# Patient Record
Sex: Male | Born: 1955
Health system: Southern US, Community
[De-identification: ages and names within clinical notes are randomized; demographics above are authoritative.]

## PROBLEM LIST (undated history)

## (undated) DIAGNOSIS — R0602 Shortness of breath: Secondary | ICD-10-CM

## (undated) DIAGNOSIS — E739 Lactose intolerance, unspecified: Secondary | ICD-10-CM

## (undated) DIAGNOSIS — E785 Hyperlipidemia, unspecified: Secondary | ICD-10-CM

## (undated) DIAGNOSIS — Z91018 Allergy to other foods: Secondary | ICD-10-CM

## (undated) DIAGNOSIS — E039 Hypothyroidism, unspecified: Secondary | ICD-10-CM

## (undated) DIAGNOSIS — R011 Cardiac murmur, unspecified: Secondary | ICD-10-CM

## (undated) DIAGNOSIS — I10 Essential (primary) hypertension: Secondary | ICD-10-CM

## (undated) DIAGNOSIS — F1990 Other psychoactive substance use, unspecified, uncomplicated: Secondary | ICD-10-CM

## (undated) DIAGNOSIS — M549 Dorsalgia, unspecified: Secondary | ICD-10-CM

## (undated) DIAGNOSIS — E559 Vitamin D deficiency, unspecified: Secondary | ICD-10-CM

## (undated) DIAGNOSIS — G4730 Sleep apnea, unspecified: Secondary | ICD-10-CM

## (undated) DIAGNOSIS — K219 Gastro-esophageal reflux disease without esophagitis: Secondary | ICD-10-CM

## (undated) DIAGNOSIS — M2550 Pain in unspecified joint: Secondary | ICD-10-CM

## (undated) DIAGNOSIS — E538 Deficiency of other specified B group vitamins: Secondary | ICD-10-CM

## (undated) HISTORY — DX: Allergy to other foods: Z91.018

## (undated) HISTORY — DX: Hyperlipidemia, unspecified: E78.5

## (undated) HISTORY — DX: Deficiency of other specified B group vitamins: E53.8

## (undated) HISTORY — DX: Shortness of breath: R06.02

## (undated) HISTORY — PX: HERNIA REPAIR: SHX51

## (undated) HISTORY — DX: Dorsalgia, unspecified: M54.9

## (undated) HISTORY — DX: Lactose intolerance, unspecified: E73.9

## (undated) HISTORY — DX: Other psychoactive substance use, unspecified, uncomplicated: F19.90

## (undated) HISTORY — PX: DENTAL SURGERY: SHX609

## (undated) HISTORY — DX: Vitamin D deficiency, unspecified: E55.9

## (undated) HISTORY — DX: Pain in unspecified joint: M25.50

## (undated) HISTORY — DX: Sleep apnea, unspecified: G47.30

## (undated) SURGERY — ARTHROSCOPY, SHOULDER
Anesthesia: General | Laterality: Left

---

## 1999-07-18 ENCOUNTER — Emergency Department (HOSPITAL_COMMUNITY): Admission: EM | Admit: 1999-07-18 | Discharge: 1999-07-18 | Payer: Self-pay | Admitting: *Deleted

## 2001-12-18 ENCOUNTER — Emergency Department (HOSPITAL_COMMUNITY): Admission: EM | Admit: 2001-12-18 | Discharge: 2001-12-18 | Payer: Self-pay | Admitting: Emergency Medicine

## 2001-12-18 ENCOUNTER — Encounter: Payer: Self-pay | Admitting: Emergency Medicine

## 2003-03-04 ENCOUNTER — Encounter: Admission: RE | Admit: 2003-03-04 | Discharge: 2003-03-04 | Payer: Self-pay | Admitting: Internal Medicine

## 2003-04-14 ENCOUNTER — Emergency Department (HOSPITAL_COMMUNITY): Admission: EM | Admit: 2003-04-14 | Discharge: 2003-04-15 | Payer: Self-pay | Admitting: Emergency Medicine

## 2003-04-16 ENCOUNTER — Ambulatory Visit (HOSPITAL_COMMUNITY): Admission: RE | Admit: 2003-04-16 | Discharge: 2003-04-16 | Payer: Self-pay | Admitting: Internal Medicine

## 2003-04-17 ENCOUNTER — Ambulatory Visit (HOSPITAL_COMMUNITY): Admission: RE | Admit: 2003-04-17 | Discharge: 2003-04-17 | Payer: Self-pay | Admitting: Internal Medicine

## 2004-11-28 ENCOUNTER — Emergency Department (HOSPITAL_COMMUNITY): Admission: EM | Admit: 2004-11-28 | Discharge: 2004-11-28 | Payer: Self-pay | Admitting: Emergency Medicine

## 2008-04-09 LAB — HM COLONOSCOPY

## 2008-07-29 LAB — HM COLONOSCOPY

## 2008-09-18 LAB — HM COLONOSCOPY

## 2008-10-26 ENCOUNTER — Encounter: Admission: RE | Admit: 2008-10-26 | Discharge: 2009-01-06 | Payer: Self-pay | Admitting: Family Medicine

## 2009-08-06 ENCOUNTER — Encounter: Admission: RE | Admit: 2009-08-06 | Discharge: 2009-09-06 | Payer: Self-pay | Admitting: General Surgery

## 2010-01-30 ENCOUNTER — Encounter: Payer: Self-pay | Admitting: Internal Medicine

## 2010-08-12 ENCOUNTER — Ambulatory Visit
Admission: RE | Admit: 2010-08-12 | Discharge: 2010-08-12 | Disposition: A | Discharge status: Home / Self Care | Payer: BC Managed Care – PPO | Source: Ambulatory Visit | Attending: Family Medicine | Admitting: Family Medicine

## 2010-08-12 ENCOUNTER — Other Ambulatory Visit: Payer: Self-pay | Admitting: Family Medicine

## 2010-08-12 DIAGNOSIS — T148XXA Other injury of unspecified body region, initial encounter: Secondary | ICD-10-CM

## 2010-08-12 DIAGNOSIS — R52 Pain, unspecified: Secondary | ICD-10-CM

## 2011-03-31 ENCOUNTER — Other Ambulatory Visit: Payer: Self-pay | Admitting: Orthopedic Surgery

## 2011-03-31 ENCOUNTER — Ambulatory Visit
Admission: RE | Admit: 2011-03-31 | Discharge: 2011-03-31 | Disposition: A | Discharge status: Home / Self Care | Payer: BC Managed Care – PPO | Source: Ambulatory Visit | Attending: Orthopedic Surgery | Admitting: Orthopedic Surgery

## 2011-03-31 DIAGNOSIS — M25519 Pain in unspecified shoulder: Secondary | ICD-10-CM

## 2011-04-04 ENCOUNTER — Other Ambulatory Visit: Payer: Self-pay | Admitting: Orthopedic Surgery

## 2011-04-04 DIAGNOSIS — M25519 Pain in unspecified shoulder: Secondary | ICD-10-CM

## 2011-04-14 ENCOUNTER — Ambulatory Visit
Admission: RE | Admit: 2011-04-14 | Discharge: 2011-04-14 | Disposition: A | Discharge status: Home / Self Care | Payer: BC Managed Care – PPO | Source: Ambulatory Visit | Attending: Orthopedic Surgery | Admitting: Orthopedic Surgery

## 2011-04-14 DIAGNOSIS — M25519 Pain in unspecified shoulder: Secondary | ICD-10-CM

## 2011-05-19 ENCOUNTER — Encounter (HOSPITAL_COMMUNITY): Payer: Self-pay | Admitting: Pharmacy Technician

## 2011-05-19 MED ORDER — ACETAMINOPHEN 10 MG/ML IV SOLN
INTRAVENOUS | Status: AC
  Filled 2011-05-19: qty 100

## 2011-05-26 ENCOUNTER — Encounter (HOSPITAL_COMMUNITY)
Admission: RE | Admit: 2011-05-26 | Discharge: 2011-05-26 | Disposition: A | Discharge status: Home / Self Care | Payer: BC Managed Care – PPO | Source: Ambulatory Visit | Attending: Orthopedic Surgery | Admitting: Orthopedic Surgery

## 2011-05-26 ENCOUNTER — Encounter (HOSPITAL_COMMUNITY): Payer: Self-pay

## 2011-05-26 ENCOUNTER — Other Ambulatory Visit (HOSPITAL_COMMUNITY): Payer: BC Managed Care – PPO

## 2011-05-26 HISTORY — DX: Hypothyroidism, unspecified: E03.9

## 2011-05-26 HISTORY — DX: Cardiac murmur, unspecified: R01.1

## 2011-05-26 HISTORY — DX: Gastro-esophageal reflux disease without esophagitis: K21.9

## 2011-05-26 HISTORY — DX: Essential (primary) hypertension: I10

## 2011-05-26 LAB — CBC
HCT: 38.7 % — ABNORMAL LOW (ref 39.0–52.0)
Hemoglobin: 13.4 g/dL (ref 13.0–17.0)
RDW: 14 % (ref 11.5–15.5)
WBC: 5.6 10*3/uL (ref 4.0–10.5)

## 2011-05-26 LAB — BASIC METABOLIC PANEL
Chloride: 104 mEq/L (ref 96–112)
GFR calc Af Amer: >90 mL/min (ref >90)
Potassium: 4.1 mEq/L (ref 3.5–5.1)

## 2011-05-26 LAB — SURGICAL PCR SCREEN
MRSA, PCR: NEGATIVE
Staphylococcus aureus: NEGATIVE

## 2011-05-26 NOTE — Pre-Procedure Instructions (Signed)
20 AADAM ZHEN  05/26/2011   Your procedure is scheduled on:  May 31, 2011, Wednesday at 0830AM    Report to [facility] at 0630 AM.  Call this number if you have problems the morning of surgery: [phone]   Remember:   Do not eat food:After Midnight.  May have clear liquids: up to 4 Hours before arrival.0230 AM  Clear liquids include soda, tea, black coffee, apple or grape juice, broth.  Take these medicines the morning of surgery with A SIP OF WATER: Hydrocodone-Acetaminophen [Norco], Nebivolol [Bystolic]    Do not wear jewelry, make-up or nail polish.   You may wear deodorant.  Do not shave 48 hours prior to surgery. Men may shave face and neck.  Do not bring valuables to the hospital.  Contacts, dentures or bridgework may not be worn into surgery.  Leave suitcase in the car. After surgery it may be brought to your room.  For patients admitted to the hospital, checkout time is 11:00 AM the day of discharge.   Patients discharged the day of surgery will not be allowed to drive home.    Special Instructions: CHG Shower Use Special Wash: 1/2 bottle night before surgery and 1/2 bottle morning of surgery.   Please read over the following fact sheets that you were given: Pain Booklet, Coughing and Deep Breathing, MRSA Information and Surgical Site Infection Prevention

## 2011-05-29 ENCOUNTER — Other Ambulatory Visit: Payer: Self-pay | Admitting: Orthopedic Surgery

## 2011-05-29 NOTE — Consult Note (Addendum)
Anesthesia Chart Review:  Patient is a 56 year old male posted for a left shoulder arthroscopy on 05/31/11.  History includes former smoker, HTN, DM2, GERD, hypothyroidism, heart murmur (not specified).    Labs noted.  Non-fasting glucose is 262.  He will get a CBG on arrival.  There were no acute findings on his CXR from 05/26/11.  EKG on 05/26/11 showed NSR.   There is no documentation of history of any cardiac testing (ie, echo).  I did leave a message on his home phone to give me a call to further clarify his murmur history and inquire about his DM control.   However, if his glucose is reasonable on the day of surgery and no worrisome finding on CV exam preoperatively, then anticipate he can proceed.  Addendum: 05/30/11 1200 Patient reports he was told he had a murmur as a teenager that was felt benign.  He never had an echocardiogram done, and he says it's never really been mentioned since.  He reports his fasting glucose is usually 126 or less.  Shonna Chock, PA-C

## 2011-05-30 LAB — HEPATIC FUNCTION PANEL
Albumin: 3.9 g/dL (ref 3.5–5.2)
Total Protein: 7.1 g/dL (ref 6.0–8.3)

## 2011-05-30 MED ORDER — VANCOMYCIN HCL IN DEXTROSE 1-5 GM/200ML-% IV SOLN
1000.0000 mg | INTRAVENOUS | Status: AC
  Administered 2011-05-31: 1000 mg via INTRAVENOUS
  Filled 2011-05-30: qty 200

## 2011-05-30 NOTE — Progress Notes (Signed)
Orders received after pre-admission appointment. BMET was drawn at PAT, and CMET was ordered. HFP added on 05/30/11. Terry Harrington, Terry Harrington

## 2011-05-31 ENCOUNTER — Observation Stay (HOSPITAL_COMMUNITY)
Admission: RE | Admit: 2011-05-31 | Discharge: 2011-06-01 | Disposition: A | Discharge status: Home / Self Care | Payer: BC Managed Care – PPO | Source: Ambulatory Visit | Attending: Orthopedic Surgery | Admitting: Orthopedic Surgery

## 2011-05-31 ENCOUNTER — Encounter (HOSPITAL_COMMUNITY): Payer: Self-pay | Admitting: General Practice

## 2011-05-31 ENCOUNTER — Encounter (HOSPITAL_COMMUNITY): Payer: Self-pay | Admitting: *Deleted

## 2011-05-31 ENCOUNTER — Encounter (HOSPITAL_COMMUNITY): Payer: Self-pay | Admitting: Vascular Surgery

## 2011-05-31 ENCOUNTER — Encounter (HOSPITAL_COMMUNITY)
Admission: RE | Disposition: A | Discharge status: Home / Self Care | Payer: Self-pay | Source: Ambulatory Visit | Attending: Orthopedic Surgery

## 2011-05-31 ENCOUNTER — Other Ambulatory Visit: Payer: Self-pay | Admitting: Orthopedic Surgery

## 2011-05-31 ENCOUNTER — Ambulatory Visit (HOSPITAL_COMMUNITY): Payer: BC Managed Care – PPO | Admitting: Vascular Surgery

## 2011-05-31 DIAGNOSIS — E119 Type 2 diabetes mellitus without complications: Secondary | ICD-10-CM | POA: Insufficient documentation

## 2011-05-31 DIAGNOSIS — G8918 Other acute postprocedural pain: Secondary | ICD-10-CM

## 2011-05-31 DIAGNOSIS — M25819 Other specified joint disorders, unspecified shoulder: Principal | ICD-10-CM | POA: Insufficient documentation

## 2011-05-31 DIAGNOSIS — K219 Gastro-esophageal reflux disease without esophagitis: Secondary | ICD-10-CM | POA: Insufficient documentation

## 2011-05-31 DIAGNOSIS — M719 Bursopathy, unspecified: Secondary | ICD-10-CM | POA: Insufficient documentation

## 2011-05-31 DIAGNOSIS — I10 Essential (primary) hypertension: Secondary | ICD-10-CM | POA: Insufficient documentation

## 2011-05-31 DIAGNOSIS — M67919 Unspecified disorder of synovium and tendon, unspecified shoulder: Secondary | ICD-10-CM | POA: Insufficient documentation

## 2011-05-31 DIAGNOSIS — Z01810 Encounter for preprocedural cardiovascular examination: Secondary | ICD-10-CM | POA: Insufficient documentation

## 2011-05-31 DIAGNOSIS — Z23 Encounter for immunization: Secondary | ICD-10-CM | POA: Insufficient documentation

## 2011-05-31 DIAGNOSIS — E039 Hypothyroidism, unspecified: Secondary | ICD-10-CM | POA: Insufficient documentation

## 2011-05-31 DIAGNOSIS — Z794 Long term (current) use of insulin: Secondary | ICD-10-CM | POA: Insufficient documentation

## 2011-05-31 DIAGNOSIS — Z01812 Encounter for preprocedural laboratory examination: Secondary | ICD-10-CM | POA: Insufficient documentation

## 2011-05-31 DIAGNOSIS — Z01818 Encounter for other preprocedural examination: Secondary | ICD-10-CM | POA: Insufficient documentation

## 2011-05-31 HISTORY — PX: SHOULDER SURGERY: SHX246

## 2011-05-31 HISTORY — PX: SHOULDER ARTHROSCOPY: SHX128

## 2011-05-31 LAB — URINALYSIS, ROUTINE W REFLEX MICROSCOPIC
Ketones, ur: NEGATIVE mg/dL
Leukocytes, UA: NEGATIVE
Nitrite: NEGATIVE
Specific Gravity, Urine: 1.025 (ref 1.005–1.030)
pH: 6.5 (ref 5.0–8.0)

## 2011-05-31 LAB — GLUCOSE, CAPILLARY: Glucose-Capillary: 193 mg/dL — ABNORMAL HIGH (ref 70–99)

## 2011-05-31 SURGERY — ARTHROSCOPY, SHOULDER
Anesthesia: General | Site: Shoulder | Laterality: Left | Wound class: Clean

## 2011-05-31 MED ORDER — PIOGLITAZONE HCL-METFORMIN HCL 15-850 MG PO TABS: 1.0000 | ORAL_TABLET | Freq: Two times a day (BID) | ORAL | Status: DC

## 2011-05-31 MED ORDER — CHLORHEXIDINE GLUCONATE 4 % EX LIQD: 60.0000 mL | Freq: Once | CUTANEOUS | Status: DC

## 2011-05-31 MED ORDER — METFORMIN HCL 850 MG PO TABS
1700.0000 mg | ORAL_TABLET | Freq: Every day | ORAL | Status: DC
  Administered 2011-05-31: 1700 mg via ORAL
  Filled 2011-05-31 (×2): qty 2

## 2011-05-31 MED ORDER — ACETAMINOPHEN 325 MG PO TABS: 650.0000 mg | ORAL_TABLET | Freq: Four times a day (QID) | ORAL | Status: DC | PRN

## 2011-05-31 MED ORDER — TESTOSTERONE 30 MG/ACT TD SOLN: 1.0000 "application " | Freq: Every day | TRANSDERMAL | Status: DC

## 2011-05-31 MED ORDER — METFORMIN HCL 500 MG PO TABS: 1000.0000 mg | ORAL_TABLET | Freq: Two times a day (BID) | ORAL | Status: DC

## 2011-05-31 MED ORDER — NEBIVOLOL HCL 5 MG PO TABS
5.0000 mg | ORAL_TABLET | Freq: Every day | ORAL | Status: DC
  Administered 2011-05-31 – 2011-06-01 (×2): 5 mg via ORAL
  Filled 2011-05-31 (×2): qty 1

## 2011-05-31 MED ORDER — BUPIVACAINE-EPINEPHRINE 0.25% -1:200000 IJ SOLN: INTRAMUSCULAR | Status: DC | PRN

## 2011-05-31 MED ORDER — ALISKIREN FUMARATE 150 MG PO TABS
150.0000 mg | ORAL_TABLET | Freq: Every day | ORAL | Status: DC
  Administered 2011-05-31 – 2011-06-01 (×2): 150 mg via ORAL
  Filled 2011-05-31 (×2): qty 1

## 2011-05-31 MED ORDER — ONDANSETRON HCL 4 MG PO TABS: 4.0000 mg | ORAL_TABLET | Freq: Four times a day (QID) | ORAL | Status: DC | PRN

## 2011-05-31 MED ORDER — GLYCOPYRROLATE 0.2 MG/ML IJ SOLN
INTRAMUSCULAR | Status: DC | PRN
  Administered 2011-05-31: 0.4 mg via INTRAVENOUS

## 2011-05-31 MED ORDER — HYDROCODONE-ACETAMINOPHEN 10-325 MG PO TABS
1.0000 | ORAL_TABLET | Freq: Four times a day (QID) | ORAL | Status: DC | PRN
  Administered 2011-05-31 – 2011-06-01 (×2): 1 via ORAL
  Filled 2011-05-31 (×2): qty 1

## 2011-05-31 MED ORDER — FENTANYL CITRATE 0.05 MG/ML IJ SOLN
INTRAMUSCULAR | Status: DC | PRN
  Administered 2011-05-31: 100 ug via INTRAVENOUS
  Administered 2011-05-31: 50 ug via INTRAVENOUS

## 2011-05-31 MED ORDER — LACTATED RINGERS IV SOLN
INTRAVENOUS | Status: DC | PRN
  Administered 2011-05-31 (×2): via INTRAVENOUS

## 2011-05-31 MED ORDER — PIOGLITAZONE HCL 30 MG PO TABS
30.0000 mg | ORAL_TABLET | Freq: Every day | ORAL | Status: DC
  Administered 2011-05-31: 30 mg via ORAL
  Filled 2011-05-31 (×2): qty 1

## 2011-05-31 MED ORDER — NEOSTIGMINE METHYLSULFATE 1 MG/ML IJ SOLN
INTRAMUSCULAR | Status: DC | PRN
  Administered 2011-05-31: 3 mg via INTRAVENOUS

## 2011-05-31 MED ORDER — DEXAMETHASONE SODIUM PHOSPHATE 4 MG/ML IJ SOLN
INTRAMUSCULAR | Status: DC | PRN
  Administered 2011-05-31: 4 mg via INTRAVENOUS

## 2011-05-31 MED ORDER — LIRAGLUTIDE 18 MG/3ML ~~LOC~~ SOLN
18.0000 mg | Freq: Every day | SUBCUTANEOUS | Status: DC
  Administered 2011-05-31: 18 mg via SUBCUTANEOUS

## 2011-05-31 MED ORDER — SUCCINYLCHOLINE CHLORIDE 20 MG/ML IJ SOLN
INTRAMUSCULAR | Status: DC | PRN
  Administered 2011-05-31: 100 mg via INTRAVENOUS

## 2011-05-31 MED ORDER — PNEUMOCOCCAL VAC POLYVALENT 25 MCG/0.5ML IJ INJ
0.5000 mL | INJECTION | INTRAMUSCULAR | Status: AC
  Administered 2011-06-01: 0.5 mL via INTRAMUSCULAR
  Filled 2011-05-31: qty 0.5

## 2011-05-31 MED ORDER — ACETAMINOPHEN 10 MG/ML IV SOLN
INTRAVENOUS | Status: DC | PRN
  Administered 2011-05-31: 1000 mg via INTRAVENOUS

## 2011-05-31 MED ORDER — METOCLOPRAMIDE HCL 10 MG PO TABS: 5.0000 mg | ORAL_TABLET | Freq: Three times a day (TID) | ORAL | Status: DC | PRN

## 2011-05-31 MED ORDER — LIDOCAINE HCL 4 % MT SOLN
OROMUCOSAL | Status: DC | PRN
  Administered 2011-05-31: 4 mL via TOPICAL

## 2011-05-31 MED ORDER — ACETAMINOPHEN 650 MG RE SUPP: 650.0000 mg | Freq: Four times a day (QID) | RECTAL | Status: DC | PRN

## 2011-05-31 MED ORDER — SODIUM CHLORIDE 0.9 % IR SOLN
Status: DC | PRN
  Administered 2011-05-31 (×2): 1

## 2011-05-31 MED ORDER — ONDANSETRON HCL 4 MG/2ML IJ SOLN: 4.0000 mg | Freq: Four times a day (QID) | INTRAMUSCULAR | Status: DC | PRN

## 2011-05-31 MED ORDER — BUPIVACAINE-EPINEPHRINE PF 0.5-1:200000 % IJ SOLN
INTRAMUSCULAR | Status: DC | PRN
  Administered 2011-05-31: 25 mL

## 2011-05-31 MED ORDER — ETODOLAC 400 MG PO TABS
400.0000 mg | ORAL_TABLET | Freq: Every day | ORAL | Status: DC
  Administered 2011-05-31 – 2011-06-01 (×2): 400 mg via ORAL
  Filled 2011-05-31 (×2): qty 1

## 2011-05-31 MED ORDER — VITAMIN B-12 1000 MCG PO TABS
2000.0000 ug | ORAL_TABLET | Freq: Every day | ORAL | Status: DC
  Administered 2011-05-31 – 2011-06-01 (×2): 2000 ug via ORAL
  Filled 2011-05-31 (×2): qty 2

## 2011-05-31 MED ORDER — LEVOTHYROXINE SODIUM 100 MCG PO TABS
100.0000 ug | ORAL_TABLET | Freq: Every day | ORAL | Status: DC
  Administered 2011-06-01: 100 ug via ORAL
  Filled 2011-05-31 (×2): qty 1

## 2011-05-31 MED ORDER — ONDANSETRON HCL 4 MG/2ML IJ SOLN
INTRAMUSCULAR | Status: DC | PRN
  Administered 2011-05-31: 4 mg via INTRAVENOUS

## 2011-05-31 MED ORDER — MIDAZOLAM HCL 5 MG/5ML IJ SOLN
INTRAMUSCULAR | Status: DC | PRN
  Administered 2011-05-31: 2 mg via INTRAVENOUS

## 2011-05-31 MED ORDER — SODIUM CHLORIDE 0.45 % IV SOLN: INTRAVENOUS | Status: DC

## 2011-05-31 MED ORDER — VITAMIN D3 25 MCG (1000 UNIT) PO TABS
2000.0000 [IU] | ORAL_TABLET | Freq: Every day | ORAL | Status: DC
  Administered 2011-05-31 – 2011-06-01 (×2): 2000 [IU] via ORAL
  Filled 2011-05-31 (×2): qty 2

## 2011-05-31 MED ORDER — HYDROMORPHONE HCL PF 1 MG/ML IJ SOLN: 0.2500 mg | INTRAMUSCULAR | Status: DC | PRN

## 2011-05-31 MED ORDER — METOCLOPRAMIDE HCL 5 MG/ML IJ SOLN: 5.0000 mg | Freq: Three times a day (TID) | INTRAMUSCULAR | Status: DC | PRN

## 2011-05-31 MED ORDER — ACETAMINOPHEN 10 MG/ML IV SOLN
INTRAVENOUS | Status: AC
  Filled 2011-05-31: qty 100

## 2011-05-31 MED ORDER — FENOFIBRATE 54 MG PO TABS
54.0000 mg | ORAL_TABLET | Freq: Once | ORAL | Status: AC
  Administered 2011-05-31: 54 mg via ORAL
  Filled 2011-05-31: qty 1

## 2011-05-31 MED ORDER — PROPOFOL 10 MG/ML IV EMUL
INTRAVENOUS | Status: DC | PRN
  Administered 2011-05-31: 150 mg via INTRAVENOUS

## 2011-05-31 MED ORDER — MENTHOL 3 MG MT LOZG: 1.0000 | LOZENGE | OROMUCOSAL | Status: DC | PRN

## 2011-05-31 MED ORDER — HYDRALAZINE HCL 20 MG/ML IJ SOLN
10.0000 mg | Freq: Four times a day (QID) | INTRAMUSCULAR | Status: DC | PRN
  Filled 2011-05-31: qty 0.5

## 2011-05-31 MED ORDER — PHENOL 1.4 % MT LIQD: 1.0000 | OROMUCOSAL | Status: DC | PRN

## 2011-05-31 MED ORDER — VANCOMYCIN HCL IN DEXTROSE 1-5 GM/200ML-% IV SOLN
1000.0000 mg | Freq: Two times a day (BID) | INTRAVENOUS | Status: AC
  Administered 2011-05-31: 1000 mg via INTRAVENOUS
  Filled 2011-05-31: qty 200

## 2011-05-31 MED ORDER — ROCURONIUM BROMIDE 100 MG/10ML IV SOLN
INTRAVENOUS | Status: DC | PRN
  Administered 2011-05-31: 20 mg via INTRAVENOUS

## 2011-05-31 MED ORDER — SODIUM CHLORIDE 0.9 % IR SOLN
Status: DC | PRN
  Administered 2011-05-31 (×4)

## 2011-05-31 MED ORDER — ADULT MULTIVITAMIN W/MINERALS CH
1.0000 | ORAL_TABLET | Freq: Every day | ORAL | Status: DC
  Administered 2011-05-31 – 2011-06-01 (×2): 1 via ORAL
  Filled 2011-05-31 (×2): qty 1

## 2011-05-31 SURGICAL SUPPLY — 48 items
BLADE CUTTER GATOR 3.5 (BLADE) IMPLANT
BLADE GREAT WHITE 4.2 (BLADE) ×2 IMPLANT
BLADE SURG 11 STRL SS (BLADE) ×2 IMPLANT
BUR OVAL 4.0 (BURR) IMPLANT
BUR OVAL 6.0 (BURR) ×1 IMPLANT
CHLORAPREP W/TINT 26ML (MISCELLANEOUS) ×1 IMPLANT
CLOTH BEACON ORANGE TIMEOUT ST (SAFETY) ×2 IMPLANT
COVER SURGICAL LIGHT HANDLE (MISCELLANEOUS) ×2 IMPLANT
DRAPE STERI 35X30 U-POUCH (DRAPES) ×1 IMPLANT
DRAPE U-SHAPE 47X51 STRL (DRAPES) ×2 IMPLANT
DRSG EMULSION OIL 3X3 NADH (GAUZE/BANDAGES/DRESSINGS) ×1 IMPLANT
DRSG PAD ABDOMINAL 8X10 ST (GAUZE/BANDAGES/DRESSINGS) ×1 IMPLANT
DURAPREP 26ML APPLICATOR (WOUND CARE) ×1 IMPLANT
ELECT MENISCUS 165MM 90D (ELECTRODE) IMPLANT
FILTER STRAW FLUID ASPIR (MISCELLANEOUS) IMPLANT
GLOVE BIOGEL PI IND STRL 6.5 (GLOVE) IMPLANT
GLOVE BIOGEL PI INDICATOR 6.5 (GLOVE) ×1
GLOVE SS PI 9.0 STRL (GLOVE) ×2 IMPLANT
GLOVE SURG SS PI 6.5 STRL IVOR (GLOVE) ×2 IMPLANT
GOWN PREVENTION PLUS XLARGE (GOWN DISPOSABLE) ×1 IMPLANT
GOWN STRL NON-REIN LRG LVL3 (GOWN DISPOSABLE) IMPLANT
KIT BASIN OR (CUSTOM PROCEDURE TRAY) ×2 IMPLANT
KIT ROOM TURNOVER OR (KITS) ×2 IMPLANT
MANIFOLD NEPTUNE II (INSTRUMENTS) ×2 IMPLANT
NDL FILTER BLUNT 18X1 1/2 (NEEDLE) IMPLANT
NDL HYPO 21X1.5 SAFETY (NEEDLE) ×1 IMPLANT
NEEDLE FILTER BLUNT 18X 1/2SAF (NEEDLE) ×4
NEEDLE FILTER BLUNT 18X1 1/2 (NEEDLE) ×4 IMPLANT
NEEDLE HYPO 21X1.5 SAFETY (NEEDLE) ×2 IMPLANT
NEEDLE HYPO 25GX1X1/2 BEV (NEEDLE) ×8 IMPLANT
NS IRRIG 1000ML POUR BTL (IV SOLUTION) ×3 IMPLANT
PACK SHOULDER (CUSTOM PROCEDURE TRAY) ×2 IMPLANT
PAD ARMBOARD 7.5X6 YLW CONV (MISCELLANEOUS) ×4 IMPLANT
SET ARTHROSCOPY TUBING (MISCELLANEOUS) ×4
SET ARTHROSCOPY TUBING LN (MISCELLANEOUS) ×1 IMPLANT
SLING ARM FOAM STRAP LRG (SOFTGOODS) ×2 IMPLANT
SPONGE GAUZE 4X4 12PLY (GAUZE/BANDAGES/DRESSINGS) ×1 IMPLANT
SPONGE LAP 4X18 X RAY DECT (DISPOSABLE) IMPLANT
SUT ETHIBOND 2 OS 4 DA (SUTURE) IMPLANT
SUT ETHILON 4 0 PS 2 18 (SUTURE) ×2 IMPLANT
SUT PROLENE 3 0 PS 2 (SUTURE) IMPLANT
SUT VIC AB 0 CTB1 27 (SUTURE) ×1 IMPLANT
SUT VIC AB 2-0 FS1 27 (SUTURE) ×2 IMPLANT
SYR 3ML LL SCALE MARK (SYRINGE) ×4 IMPLANT
SYR CONTROL 10ML LL (SYRINGE) ×2 IMPLANT
TOWEL OR 17X24 6PK STRL BLUE (TOWEL DISPOSABLE) IMPLANT
TOWEL OR 17X26 10 PK STRL BLUE (TOWEL DISPOSABLE) ×1 IMPLANT
WATER STERILE IRR 1000ML POUR (IV SOLUTION) ×2 IMPLANT

## 2011-05-31 NOTE — Anesthesia Procedure Notes (Signed)
Anesthesia Regional Block:  Interscalene brachial plexus block  Pre-Anesthetic Checklist: ,, timeout performed, Correct Patient, Correct Site, Correct Laterality, Correct Procedure, Correct Position, site marked, Risks and benefits discussed,  Surgical consent,  Pre-op evaluation,  At surgeon's request and post-op pain management  Laterality: Left     Needles:  Injection technique: Single-shot  Needle Type: Stimulator Needle - 40      Needle Gauge: 22 and 22 G    Additional Needles:  Procedures: nerve stimulator Interscalene brachial plexus block  Nerve Stimulator or Paresthesia:  Response: 0.48 mA,   Additional Responses:   Narrative:  Start time: 05/31/2011 8:19 AM End time: 05/31/2011 8:25 AM Injection made incrementally with aspirations every 5 mL. Anesthesiologist: Dr Gypsy Balsam  Additional Notes: 1610-9604 L ISB POP CHG prep, sterile tech #22 stim needle w/stim down to .48ma biceps Marc .5% w/epi 1:200000 total 25cc + decadron 4mg  Multiple neg asp No compl Dr Gypsy Balsam

## 2011-05-31 NOTE — Anesthesia Postprocedure Evaluation (Signed)
  Anesthesia Post-op Note  Patient: Terry Harrington  Procedure(s) Performed: Procedure(s) (LRB): ARTHROSCOPY SHOULDER (Left)  Patient Location: PACU  Anesthesia Type: GA combined with regional for post-op pain  Level of Consciousness: awake and alert   Airway and Oxygen Therapy: Patient Spontanous Breathing and Patient connected to nasal cannula oxygen  Post-op Pain: none  Post-op Assessment: Post-op Vital signs reviewed, Patient's Cardiovascular Status Stable, Respiratory Function Stable, Patent Airway and No signs of Nausea or vomiting  Post-op Vital Signs: Reviewed and stable  Complications: No apparent anesthesia complications

## 2011-05-31 NOTE — Op Note (Signed)
NAME:  Terry Harrington, Terry Harrington NO.:  [id number]  MEDICAL RECORD NO.:  [id number]  LOCATION:  [facility]                         FACILITY:  [facility]  PHYSICIAN:  Myrtie Neither, MD      DATE OF BIRTH:  01/19/1955  DATE OF PROCEDURE: DATE OF DISCHARGE:                              OPERATIVE REPORT   PREOPERATIVE DIAGNOSES:  Impingement syndrome, left shoulder; rotator cuff tendinopathy.  POSTOPERATIVE DIAGNOSES:  Impingement syndrome, left shoulder; rotator cuff tendinopathy.  ANESTHESIA:  General.  PROCEDURE:  Arthroscopic acromioplasty and complete synovectomy of the shoulder.  The patient was taken to the operating room.  After given adequate preop medications, given general anesthesia and intubated, the patient was placed in a barber-chair position.  Left shoulder was prepped with Hibiclens and draped in sterile manner.  A posterior incision was made, followed by placement of Swisher rod from posterior to anterior. Anterior incision was made inflow order.  A separate lateral incision was made for the orthoscope and shaver.  Inspection revealed tremendously hypertrophic overgrowth in subacromial bursal sac, chondromalacia changes of the subacromial surface.  Rotator cuff was found to be thickened, but without tear.  Complete synovectomy was initially done.  Then followed by acromioplasty with use of 0.6 bur. After adequate synovectomy and debridement of the area, regaining of the subacromial space, cuff was inspected and again was found to be intact. Further copious and abundant irrigation was then done.  Wound closure was then done with use of 4-0 nylon.  Compressive dressing applied. Sling was then applied.  The patient tolerated procedure quite well and went to recovery room in stable and satisfactory condition.  The patient is being kept 23-hour observation for pain control and will be discharged tomorrow with Percocet 1-2 q.4 p.r.n. for pain, ice packs, and return to  the office in 1 week.  The patient will be discharged in stable and satisfactory condition.     Myrtie Neither, MD     AC/MEDQ  D:  05/31/2011  T:  05/31/2011  Job:  [id number]

## 2011-05-31 NOTE — Plan of Care (Signed)
Problem: Consults Goal: Rotator Cuff Repair Patient Education See Patient Education Module for education specifics. Outcome: Completed/Met Date Met:  05/31/11 Left shoulder arthroscopy

## 2011-05-31 NOTE — Anesthesia Preprocedure Evaluation (Addendum)
Anesthesia Evaluation  Patient identified by MRN, date of birth, ID band Patient awake    Reviewed: Allergy & Precautions, H&P , NPO status , Patient's Chart, lab work & pertinent test results  Airway Mallampati: I TM Distance: >3 FB Neck ROM: Full    Dental No notable dental hx. (+) Teeth Intact and Dental Advisory Given   Pulmonary neg pulmonary ROS,  breath sounds clear to auscultation  Pulmonary exam normal       Cardiovascular hypertension, + Valvular Problems/Murmurs Rhythm:Regular Rate:Normal     Neuro/Psych negative neurological ROS  negative psych ROS   GI/Hepatic Neg liver ROS, GERD-  ,  Endo/Other  Diabetes mellitus-, Type 1, Insulin DependentHypothyroidism   Renal/GU negative Renal ROS  negative genitourinary   Musculoskeletal   Abdominal   Peds  Hematology negative hematology ROS (+)   Anesthesia Other Findings   Reproductive/Obstetrics negative OB ROS                          Anesthesia Physical Anesthesia Plan  ASA: III  Anesthesia Plan: General   Post-op Pain Management:    Induction: Intravenous  Airway Management Planned: Oral ETT  Additional Equipment:   Intra-op Plan:   Post-operative Plan: Extubation in OR  Informed Consent: I have reviewed the patients History and Physical, chart, labs and discussed the procedure including the risks, benefits and alternatives for the proposed anesthesia with the patient or authorized representative who has indicated his/her understanding and acceptance.   Dental advisory given  Plan Discussed with: CRNA  Anesthesia Plan Comments:         Anesthesia Quick Evaluation

## 2011-05-31 NOTE — H&P (Signed)
NAME:  Terry Harrington, Terry Harrington NO.:  [id number]  MEDICAL RECORD NO.:  [id number]  LOCATION:  [facility]                         FACILITY:  [facility]  PHYSICIAN:  Myrtie Neither, MD      DATE OF BIRTH:  12/17/1955  DATE OF ADMISSION:  05/31/2011 DATE OF DISCHARGE:                             HISTORY & PHYSICAL   CHIEF COMPLAINT:  Painful left shoulder.  HISTORY OF PRESENT ILLNESS:  This is a 56 year old male who is being followed in the office for impingement syndrome and rotator cuff tendinopathy over the past few weeks with treatment with use of therapeutic injection, shoulder exercises, pain medication, anti- inflammatories without improvement.  The patient has had increased loss of function with difficulty reaching at about chest level.  SOCIAL HISTORY:  The patient is a smoker and has occasional use of alcohol.  No history of use of illegal drugs.  ALLERGIES: 1. CRAB. 2. SHELLFISH. 3. IODINE. 4. PEANUT. 5. IOHEXOL. 6. ASPIRIN. 7. CODEINE. 8. PENICILLIN.  REVIEW OF SYSTEMS:  No cardiac, respiratory, or urinary symptoms.  FAMILY HISTORY:  Noncontributory.  MEDICATIONS:  Danie Chandler, vitamin D, Trilipix, Lodine, Norco 10/325, Synthroid, Victoza, Glucophage, Bystolic, Actoplus Met, Axiron, vitamin B12.  PAST MEDICAL HISTORY:  Does include: 1. Hypertension. 2. Heart murmur. 3. Diabetes mellitus. 4. Hypothyroidism. 5. GERD.  PHYSICAL EXAMINATION:  VITAL SIGNS:  Temperature 97.6, pulse 68, respirations 20, blood pressure 170/85, O2 saturation 100%. HEAD:  Normocephalic. EYES:  Conjunctivae clear. NECK:  Supple. CHEST:  Clear. CARDIAC:  S1, S2.  Regular. EXTREMITIES:  Left shoulder tender anterior and laterally with pain on abduction at above 90 degrees with subacromial crepitus.  Increased pain on resisted abduction and external rotation.  MRI reveals rotator cuff tendinopathy, impingement syndrome, left shoulder.  IMPRESSION:  Impingement syndrome and rotator  cuff tendinopathy, left shoulder.  PLAN:  Arthroscopic acromioplasty, synovectomy, left shoulder.     Myrtie Neither, MD     AC/MEDQ  D:  05/31/2011  T:  05/31/2011  Job:  [phone] [id number]

## 2011-05-31 NOTE — Progress Notes (Signed)
Patient with elevated blood pressures 207/106.  Dr. Montez Morita notified and orders received.  Blood pressure down to 156/69 presently.  Continue to monitor.

## 2011-05-31 NOTE — Brief Op Note (Signed)
05/31/2011  10:26 AM  PATIENT:  Terry Harrington  56 y.o. male  PRE-OPERATIVE DIAGNOSIS:  Impingement Syndrome Left Shoulder  POST-OPERATIVE DIAGNOSIS:  Impingement Syndrome Left Shoulder  PROCEDURE:  Procedure(s) (LRB): ARTHROSCOPY SHOULDER (Left)  SURGEON:  Surgeon(s) and Role:    * Kennieth Rad, MD - Primary  PHYSICIAN ASSISTANT:   ASSISTANTS: none   ANESTHESIA:   general  EBL:  Total I/O In: 1500 [I.V.:1500] Out: -   BLOOD ADMINISTERED:none  DRAINS: none   LOCAL MEDICATIONS USED:  NONE  SPECIMEN:  No Specimen  DISPOSITION OF SPECIMEN:  N/A  COUNTS:  YES  TOURNIQUET:  * No tourniquets in log *  DICTATION: .Other Dictation: Dictation Number report #[id number]  PLAN OF CARE: Admit for overnight observation  PATIENT DISPOSITION:  PACU - hemodynamically stable.   Delay start of Pharmacological VTE agent (>24hrs) due to surgical blood loss or risk of bleeding: not applicable

## 2011-05-31 NOTE — H&P (Signed)
Terry Harrington is an 56 y.o. male.   Chief Complaint: PAINFUL LEFT SHOULDER HPI: PERSISTENT  PAIN IN LEFT SHOULDER FOR PAST FEW MONTHS WITH INCREASED LOSS  OF OF FUNCTION NOT RESPONDING TO MEDICATION AND INJECTION.  Past Medical History  Diagnosis Date  . Hypertension     on meds  . Heart murmur   . Diabetes mellitus   . Hypothyroidism     on meds  . GERD (gastroesophageal reflux disease)     Past Surgical History  Procedure Date  . Hernia repair     as a child  . Dental surgery     several dental procedure approx 10 between 1999-2012    Family History  Problem Relation Age of Onset  . Anesthesia problems Neg Hx   . Hypotension Neg Hx   . Malignant hyperthermia Neg Hx   . Pseudochol deficiency Neg Hx    Social History:  reports that he has quit smoking. His smoking use included Cigarettes. He has a 39 pack-year smoking history. He quit smokeless tobacco use about 3 years ago. He reports that he drinks alcohol. He reports that he does not use illicit drugs.  Allergies:  Allergies  Allergen Reactions  . Crab (Shellfish Allergy) Anaphylaxis  . Iodine Anaphylaxis  . Iohexol Swelling  . Peanut-Containing Drug Products Itching    And whelps  . Aspirin Rash  . Codeine Rash  . Penicillins Rash    Medications Prior to Admission  Medication Sig Dispense Refill  . aliskiren (TEKTURNA) 150 MG tablet Take 150 mg by mouth daily.      . cholecalciferol (VITAMIN D) 1000 UNITS tablet Take 2,000 Units by mouth daily.      . Choline Fenofibrate (TRILIPIX) 135 MG capsule Take 135 mg by mouth daily.      Marland Kitchen etodolac (LODINE) 400 MG tablet Take 400 mg by mouth daily.      Marland Kitchen HYDROcodone-acetaminophen (NORCO) 10-325 MG per tablet Take 1 tablet by mouth every 6 (six) hours as needed.      Marland Kitchen levothyroxine (SYNTHROID, LEVOTHROID) 100 MCG tablet Take 100 mcg by mouth daily.      . Liraglutide (VICTOZA) 18 MG/3ML SOLN Inject 18 mg into the skin daily.      . metFORMIN (GLUCOPHAGE) 1000 MG  tablet Take 1,000 mg by mouth 2 (two) times daily with a meal.      . Multiple Vitamin (MULITIVITAMIN WITH MINERALS) TABS Take 1 tablet by mouth daily.      . nebivolol (BYSTOLIC) 5 MG tablet Take 5 mg by mouth daily.      . Testosterone (AXIRON) 30 MG/ACT SOLN Place 1 application onto the skin daily.       . vitamin B-12 (CYANOCOBALAMIN) 1000 MCG tablet Take 2,000 mcg by mouth daily.      . pioglitazone-metformin (ACTOPLUS MET) 15-850 MG per tablet Take 1 tablet by mouth 2 (two) times daily with a meal.        Results for orders placed during the hospital encounter of 05/31/11 (from the past 48 hour(s))  URINALYSIS, ROUTINE W REFLEX MICROSCOPIC     Status: Abnormal   Collection Time   05/31/11  6:43 AM      Component Value Range Comment   Color, Urine YELLOW  YELLOW     APPearance CLEAR  CLEAR     Specific Gravity, Urine 1.025  1.005 - 1.030     pH 6.5  5.0 - 8.0     Glucose, UA NEGATIVE  NEGATIVE (mg/dL)    Hgb urine dipstick NEGATIVE  NEGATIVE     Bilirubin Urine LARGE (*) NEGATIVE     Ketones, ur NEGATIVE  NEGATIVE (mg/dL)    Protein, ur NEGATIVE  NEGATIVE (mg/dL)    Urobilinogen, UA 0.2  0.0 - 1.0 (mg/dL)    Nitrite NEGATIVE  NEGATIVE     Leukocytes, UA NEGATIVE  NEGATIVE  MICROSCOPIC NOT DONE ON URINES WITH NEGATIVE PROTEIN, BLOOD, LEUKOCYTES, NITRITE, OR GLUCOSE <1000 mg/dL.   No results found.  Review of Systems  Constitutional: Negative.   HENT: Negative.   Eyes: Negative.   Respiratory: Negative.   Cardiovascular: Negative.   Gastrointestinal: Negative.   Genitourinary: Negative.   Musculoskeletal: Negative for joint pain.  Skin: Negative.   Neurological: Negative.   Endo/Heme/Allergies: Negative.   Psychiatric/Behavioral: Negative.     Blood pressure 172/85, pulse 68, temperature 97.6 Harrington (36.4 C), temperature source Oral, resp. rate 20, SpO2 100.00%. Physical Exam   Assessment/plan;    IMPINGEMENT SYNDROME  LEFT SHOULDER/ PLAN; ARTHROSCOPY LEFT  SHOULDERCARTER,Terry Harrington 05/31/2011, 7:59 AM

## 2011-05-31 NOTE — Transfer of Care (Signed)
Immediate Anesthesia Transfer of Care Note  Patient: Terry Harrington  Procedure(s) Performed: Procedure(s) (LRB): ARTHROSCOPY SHOULDER (Left)  Patient Location: PACU  Anesthesia Type: GA combined with regional for post-op pain  Level of Consciousness: awake, alert  and oriented  Airway & Oxygen Therapy: Patient Spontanous Breathing and Patient connected to nasal cannula oxygen  Post-op Assessment: Report given to PACU RN, Post -op Vital signs reviewed and stable and Patient moving all extremities X 4  Post vital signs: Reviewed and stable  Complications: No apparent anesthesia complications

## 2011-06-01 ENCOUNTER — Encounter (HOSPITAL_COMMUNITY): Payer: Self-pay | Admitting: Orthopedic Surgery

## 2011-06-01 LAB — GLUCOSE, CAPILLARY
Glucose-Capillary: 116 mg/dL — ABNORMAL HIGH (ref 70–99)
Glucose-Capillary: 129 mg/dL — ABNORMAL HIGH (ref 70–99)
Glucose-Capillary: 131 mg/dL — ABNORMAL HIGH (ref 70–99)

## 2011-06-01 MED ORDER — OXYCODONE-ACETAMINOPHEN 7.5-325 MG PO TABS: 1.0000 | ORAL_TABLET | ORAL | Status: AC | PRN

## 2011-06-01 NOTE — Discharge Summary (Signed)
NAMENIKASH, MORTENSEN NO.:  [id number]  MEDICAL RECORD NO.:  [id number]  LOCATION:  5010                         FACILITY:  [facility]  PHYSICIAN:  Myrtie Neither, MD      DATE OF BIRTH:  October 18, 1955  DATE OF ADMISSION:  05/31/2011 DATE OF DISCHARGE:  06/01/2011                              DISCHARGE SUMMARY   ADMITTING DIAGNOSES:  Impingement syndrome, rotator cuff tendinopathy.  DISCHARGE DIAGNOSES:  Impingement syndrome, rotator cuff tendinopathy.  COMPLICATIONS:  None.  INFECTIONS:  None.  OPERATION:  Arthroscopic acromioplasty and synovectomy, left shoulder.  PERTINENT HISTORY:  This is a 56 year old male being followed in the office for impingement and rotator cuff tendinopathy involving his left shoulder over the past several months.  The patient has had therapeutic injections, anti-inflammatories, and pain medication with progressive worsening of symptoms.  MRI demonstrates rotator cuff tendinopathy with known complete rotator cuff tear.  The patient underwent preop laboratory CBC, EKG, chest x-ray, CMET, UA.  The patient's labs were stable enough to undergo surgery.  The patient underwent arthroscopic acromioplasty and synovectomy of the left shoulder.  Tolerated the procedure quite well.  Postop course was fairly benign.  The patient's pain was brought under control.  The patient is being able to be discharged home on Percocet 1-2 q.4 h. p.r.n. for pain, ice packs, use of sling and return to the office in 1 week.  The patient is being discharged in stable and satisfactory condition.     Myrtie Neither, MD     AC/MEDQ  D:  06/01/2011  T:  06/01/2011  Job:  [id number]

## 2011-06-01 NOTE — Progress Notes (Signed)
Utilization review completed. Rainbow Salman, RN, BSN. 06/01/11  

## 2011-06-01 NOTE — Evaluation (Signed)
Occupational Therapy Evaluation Patient Details Name: Terry Harrington MRN: [id number] DOB: 06/16/1955 Today's Date: 06/01/2011 Time: 4098-1191 OT Time Calculation (min): 29 min  OT Assessment / Plan / Recommendation Clinical Impression  Pt s/p left rotator cuff repair and is doing very well. All education completed. Pt is to have 24hr assist available for d/c home (family present for all education). No further OT needs at this time as pt is to d/c after therapy. Encouraged pt to question physician as to when his shoulder rehab will begin    OT Assessment  Patient does not need any further OT services    Follow Up Recommendations  No OT follow up    Barriers to Discharge      Equipment Recommendations       Recommendations for Other Services    Frequency       Precautions / Restrictions Precautions Precautions: Shoulder Type of Shoulder Precautions: left shoulder; rotator cuff repair   Pertinent Vitals/Pain Pt denies any pain at this time    ADL  Toilet Transfer: Simulated;Independent Toilet Transfer Method: Sit to stand ADL Comments: Educated pt re: NWB through left shoulder; sling management; LUE elbow and distal A/ROM exercises; bathing/dressing adaptations; ability to use Lt hand for light stabilization tasks. Pt given handout. Also, pt I with ambulation in room. No further OT indicated at this time; pt is to d/c after therapy session ends.    OT Diagnosis:    OT Problem List:   OT Treatment Interventions:     OT Goals    Visit Information  Last OT Received On: 06/01/11 Assistance Needed: +1    Subjective Data  Subjective: I've been waiting for you Patient Stated Goal: Return home; return to work   Prior Functioning  Home Living Lives With: Alone Available Help at Discharge: Family;Available 24 hours/day Type of Home: House Home Access: Stairs to enter [employer] of Steps: 3 Entrance Stairs-Rails: Left;Right Home Layout: One level Bathroom  Shower/Tub: Tub/shower unit;Walk-in shower;Door [employer]: Standard Prior Function Level of Independence: Independent Able to Take Stairs?: Reciprically Driving: Yes 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

## 2011-06-26 ENCOUNTER — Ambulatory Visit: Payer: BC Managed Care – PPO | Attending: Orthopedic Surgery | Admitting: Physical Therapy

## 2011-06-26 DIAGNOSIS — M25519 Pain in unspecified shoulder: Secondary | ICD-10-CM | POA: Insufficient documentation

## 2011-06-26 DIAGNOSIS — M25619 Stiffness of unspecified shoulder, not elsewhere classified: Secondary | ICD-10-CM | POA: Insufficient documentation

## 2011-06-26 DIAGNOSIS — IMO0001 Reserved for inherently not codable concepts without codable children: Secondary | ICD-10-CM | POA: Insufficient documentation

## 2011-06-28 ENCOUNTER — Ambulatory Visit: Payer: BC Managed Care – PPO | Admitting: Rehabilitation

## 2011-06-30 ENCOUNTER — Ambulatory Visit: Payer: BC Managed Care – PPO | Admitting: Physical Therapy

## 2011-07-03 ENCOUNTER — Ambulatory Visit: Payer: BC Managed Care – PPO | Admitting: Physical Therapy

## 2011-07-05 ENCOUNTER — Ambulatory Visit: Payer: BC Managed Care – PPO | Admitting: Physical Therapy

## 2011-07-07 ENCOUNTER — Ambulatory Visit: Payer: BC Managed Care – PPO | Admitting: Physical Therapy

## 2011-07-11 ENCOUNTER — Ambulatory Visit: Payer: BC Managed Care – PPO | Attending: Orthopedic Surgery | Admitting: Physical Therapy

## 2011-07-11 DIAGNOSIS — IMO0001 Reserved for inherently not codable concepts without codable children: Secondary | ICD-10-CM | POA: Insufficient documentation

## 2011-07-11 DIAGNOSIS — M25619 Stiffness of unspecified shoulder, not elsewhere classified: Secondary | ICD-10-CM | POA: Insufficient documentation

## 2011-07-11 DIAGNOSIS — M25519 Pain in unspecified shoulder: Secondary | ICD-10-CM | POA: Insufficient documentation

## 2011-07-14 ENCOUNTER — Ambulatory Visit: Payer: BC Managed Care – PPO | Admitting: Rehabilitation

## 2011-07-17 ENCOUNTER — Ambulatory Visit: Payer: BC Managed Care – PPO | Admitting: Physical Therapy

## 2011-07-19 ENCOUNTER — Ambulatory Visit: Payer: BC Managed Care – PPO | Admitting: Physical Therapy

## 2011-07-21 ENCOUNTER — Ambulatory Visit: Payer: BC Managed Care – PPO | Admitting: Physical Therapy

## 2011-07-24 ENCOUNTER — Ambulatory Visit: Payer: BC Managed Care – PPO | Admitting: Physical Therapy

## 2011-07-26 ENCOUNTER — Ambulatory Visit: Payer: BC Managed Care – PPO | Admitting: Physical Therapy

## 2011-07-28 ENCOUNTER — Ambulatory Visit: Payer: BC Managed Care – PPO | Admitting: Physical Therapy

## 2011-07-31 ENCOUNTER — Ambulatory Visit: Payer: BC Managed Care – PPO | Admitting: Physical Therapy

## 2011-08-02 ENCOUNTER — Ambulatory Visit: Payer: BC Managed Care – PPO | Admitting: Physical Therapy

## 2011-08-04 ENCOUNTER — Ambulatory Visit: Payer: BC Managed Care – PPO | Admitting: Physical Therapy

## 2011-08-07 ENCOUNTER — Ambulatory Visit: Payer: BC Managed Care – PPO | Admitting: Physical Therapy

## 2011-08-11 ENCOUNTER — Ambulatory Visit: Payer: BC Managed Care – PPO | Attending: Orthopedic Surgery | Admitting: Physical Therapy

## 2011-08-11 DIAGNOSIS — M25519 Pain in unspecified shoulder: Secondary | ICD-10-CM | POA: Insufficient documentation

## 2011-08-11 DIAGNOSIS — IMO0001 Reserved for inherently not codable concepts without codable children: Secondary | ICD-10-CM | POA: Insufficient documentation

## 2011-08-11 DIAGNOSIS — M25619 Stiffness of unspecified shoulder, not elsewhere classified: Secondary | ICD-10-CM | POA: Insufficient documentation

## 2011-08-15 ENCOUNTER — Ambulatory Visit: Payer: BC Managed Care – PPO | Admitting: Physical Therapy

## 2011-08-18 ENCOUNTER — Ambulatory Visit: Payer: BC Managed Care – PPO | Admitting: Physical Therapy

## 2011-08-23 ENCOUNTER — Ambulatory Visit: Payer: BC Managed Care – PPO | Admitting: Physical Therapy

## 2011-08-25 ENCOUNTER — Ambulatory Visit: Payer: BC Managed Care – PPO | Admitting: Physical Therapy

## 2013-02-08 ENCOUNTER — Encounter (HOSPITAL_BASED_OUTPATIENT_CLINIC_OR_DEPARTMENT_OTHER): Payer: Self-pay | Admitting: Emergency Medicine

## 2013-02-08 ENCOUNTER — Emergency Department (HOSPITAL_BASED_OUTPATIENT_CLINIC_OR_DEPARTMENT_OTHER)
Admission: EM | Admit: 2013-02-08 | Discharge: 2013-02-08 | Disposition: A | Discharge status: Home / Self Care | Payer: BC Managed Care – PPO | Attending: Emergency Medicine | Admitting: Emergency Medicine

## 2013-02-08 DIAGNOSIS — R739 Hyperglycemia, unspecified: Secondary | ICD-10-CM

## 2013-02-08 DIAGNOSIS — Z880 Allergy status to penicillin: Secondary | ICD-10-CM | POA: Insufficient documentation

## 2013-02-08 DIAGNOSIS — E039 Hypothyroidism, unspecified: Secondary | ICD-10-CM | POA: Insufficient documentation

## 2013-02-08 DIAGNOSIS — I10 Essential (primary) hypertension: Secondary | ICD-10-CM

## 2013-02-08 DIAGNOSIS — R110 Nausea: Secondary | ICD-10-CM | POA: Insufficient documentation

## 2013-02-08 DIAGNOSIS — K219 Gastro-esophageal reflux disease without esophagitis: Secondary | ICD-10-CM | POA: Insufficient documentation

## 2013-02-08 DIAGNOSIS — R011 Cardiac murmur, unspecified: Secondary | ICD-10-CM | POA: Insufficient documentation

## 2013-02-08 DIAGNOSIS — Z9119 Patient's noncompliance with other medical treatment and regimen: Secondary | ICD-10-CM | POA: Insufficient documentation

## 2013-02-08 DIAGNOSIS — Z79899 Other long term (current) drug therapy: Secondary | ICD-10-CM | POA: Insufficient documentation

## 2013-02-08 DIAGNOSIS — E119 Type 2 diabetes mellitus without complications: Secondary | ICD-10-CM | POA: Insufficient documentation

## 2013-02-08 DIAGNOSIS — Z87891 Personal history of nicotine dependence: Secondary | ICD-10-CM | POA: Insufficient documentation

## 2013-02-08 DIAGNOSIS — Z91199 Patient's noncompliance with other medical treatment and regimen due to unspecified reason: Secondary | ICD-10-CM | POA: Insufficient documentation

## 2013-02-08 LAB — BASIC METABOLIC PANEL
BUN: 13 mg/dL (ref 6–23)
CALCIUM: 10 mg/dL (ref 8.4–10.5)
CO2: 22 mEq/L (ref 19–32)
Chloride: 96 mEq/L (ref 96–112)
Creatinine, Ser: 0.7 mg/dL (ref 0.50–1.35)
GFR calc Af Amer: >90 mL/min (ref >90)
GLUCOSE: 483 mg/dL — AB (ref 70–99)
Potassium: 4.1 mEq/L (ref 3.7–5.3)
SODIUM: 134 meq/L — AB (ref 137–147)

## 2013-02-08 LAB — POCT I-STAT 3, VENOUS BLOOD GAS (G3P V)
ACID-BASE DEFICIT: 2 mmol/L (ref 0.0–2.0)
BICARBONATE: 23.3 meq/L (ref 20.0–24.0)
O2 Saturation: 75 %
TCO2: 24 mmol/L (ref 0–100)
pCO2, Ven: 39.6 mmHg — ABNORMAL LOW (ref 45.0–50.0)
pH, Ven: 7.376 — ABNORMAL HIGH (ref 7.250–7.300)
pO2, Ven: 41 mmHg (ref 30.0–45.0)

## 2013-02-08 LAB — URINE MICROSCOPIC-ADD ON

## 2013-02-08 LAB — GLUCOSE, CAPILLARY
GLUCOSE-CAPILLARY: 453 mg/dL — AB (ref 70–99)
Glucose-Capillary: 313 mg/dL — ABNORMAL HIGH (ref 70–99)

## 2013-02-08 LAB — URINALYSIS, ROUTINE W REFLEX MICROSCOPIC
Bilirubin Urine: NEGATIVE
Glucose, UA: >1000 mg/dL — AB
HGB URINE DIPSTICK: NEGATIVE
Ketones, ur: NEGATIVE mg/dL
Leukocytes, UA: NEGATIVE
Nitrite: NEGATIVE
PROTEIN: NEGATIVE mg/dL
Specific Gravity, Urine: 1.036 — ABNORMAL HIGH (ref 1.005–1.030)
UROBILINOGEN UA: 0.2 mg/dL (ref 0.0–1.0)
pH: 6 (ref 5.0–8.0)

## 2013-02-08 LAB — KETONES, QUALITATIVE: ACETONE BLD: NEGATIVE

## 2013-02-08 MED ORDER — NEBIVOLOL HCL 5 MG PO TABS: 5.0000 mg | ORAL_TABLET | Freq: Every day | ORAL | Status: DC

## 2013-02-08 MED ORDER — SODIUM CHLORIDE 0.9 % IV BOLUS (SEPSIS)
1000.0000 mL | Freq: Once | INTRAVENOUS | Status: AC
  Administered 2013-02-08: 1000 mL via INTRAVENOUS

## 2013-02-08 MED ORDER — PIOGLITAZONE HCL-METFORMIN HCL 15-850 MG PO TABS: 2.0000 | ORAL_TABLET | Freq: Every day | ORAL | Status: DC

## 2013-02-08 MED ORDER — ALISKIREN FUMARATE 150 MG PO TABS: 150.0000 mg | ORAL_TABLET | Freq: Every day | ORAL | Status: DC

## 2013-02-08 MED ORDER — LEVOTHYROXINE SODIUM 100 MCG PO TABS: 100.0000 ug | ORAL_TABLET | Freq: Every day | ORAL | Status: DC

## 2013-02-08 MED ORDER — CHOLINE FENOFIBRATE 135 MG PO CPDR: 135.0000 mg | DELAYED_RELEASE_CAPSULE | Freq: Every day | ORAL | Status: DC

## 2013-02-08 NOTE — Discharge Instructions (Signed)
Arterial Hypertension °Arterial hypertension (high blood pressure) is a condition of elevated pressure in your blood vessels. Hypertension over a long period of time is a risk factor for strokes, heart attacks, and heart failure. It is also the leading cause of kidney (renal) failure.  °CAUSES  °· In Adults -- Over 90% of all hypertension has no known cause. This is called essential or primary hypertension. In the other 10% of people with hypertension, the increase in blood pressure is caused by another disorder. This is called secondary hypertension. Important causes of secondary hypertension are: °· Heavy alcohol use. °· Obstructive sleep apnea. °· Hyperaldosterosim (Conn's syndrome). °· Steroid use. °· Chronic kidney failure. °· Hyperparathyroidism. °· Medications. °· Renal artery stenosis. °· Pheochromocytoma. °· Cushing's disease. °· Coarctation of the aorta. °· Scleroderma renal crisis. °· Licorice (in excessive amounts). °· Drugs (cocaine, methamphetamine). °Your caregiver can explain any items above that apply to you. °· In Children -- Secondary hypertension is more common and should always be considered. °· Pregnancy -- Few women of childbearing age have high blood pressure. However, up to 10% of them develop hypertension of pregnancy. Generally, this will not harm the woman. It may be a sign of 3 complications of pregnancy: preeclampsia, HELLP syndrome, and eclampsia. Follow up and control with medication is necessary. °SYMPTOMS  °· This condition normally does not produce any noticeable symptoms. It is usually found during a routine exam. °· Malignant hypertension is a late problem of high blood pressure. It may have the following symptoms: °· Headaches. °· Blurred vision. °· End-organ damage (this means your kidneys, heart, lungs, and other organs are being damaged). °· Stressful situations can increase the blood pressure. If a person with normal blood pressure has their blood pressure go up while being  seen by their caregiver, this is often termed "white coat hypertension." Its importance is not known. It may be related with eventually developing hypertension or complications of hypertension. °· Hypertension is often confused with mental tension, stress, and anxiety. °DIAGNOSIS  °The diagnosis is made by 3 separate blood pressure measurements. They are taken at least 1 week apart from each other. If there is organ damage from hypertension, the diagnosis may be made without repeat measurements. °Hypertension is usually identified by having blood pressure readings: °· Above 140/90 mmHg measured in both arms, at 3 separate times, over a couple weeks. °· Over 130/80 mmHg should be considered a risk factor and may require treatment in patients with diabetes. °Blood pressure readings over 120/80 mmHg are called "pre-hypertension" even in non-diabetic patients. °To get a true blood pressure measurement, use the following guidelines. Be aware of the factors that can alter blood pressure readings. °· Take measurements at least 1 hour after caffeine. °· Take measurements 30 minutes after smoking and without any stress. This is another reason to quit smoking  it raises your blood pressure. °· Use a proper cuff size. Ask your caregiver if you are not sure about your cuff size. °· Most home blood pressure cuffs are automatic. They will measure systolic and diastolic pressures. The systolic pressure is the pressure reading at the start of sounds. Diastolic pressure is the pressure at which the sounds disappear. If you are elderly, measure pressures in multiple postures. Try sitting, lying or standing. °· Sit at rest for a minimum of 5 minutes before taking measurements. °· You should not be on any medications like decongestants. These are found in many cold medications. °· Record your blood pressure readings and review   them with your caregiver. °If you have hypertension: °· Your caregiver may do tests to be sure you do not have  secondary hypertension (see "causes" above). °· Your caregiver may also look for signs of metabolic syndrome. This is also called Syndrome X or Insulin Resistance Syndrome. You may have this syndrome if you have type 2 diabetes, abdominal obesity, and abnormal blood lipids in addition to hypertension. °· Your caregiver will take your medical and family history and perform a physical exam. °· Diagnostic tests may include blood tests (for glucose, cholesterol, potassium, and kidney function), a urinalysis, or an EKG. Other tests may also be necessary depending on your condition. °PREVENTION  °There are important lifestyle issues that you can adopt to reduce your chance of developing hypertension: °· Maintain a normal weight. °· Limit the amount of salt (sodium) in your diet. °· Exercise often. °· Limit alcohol intake. °· Get enough potassium in your diet. Discuss specific advice with your caregiver. °· Follow a DASH diet (dietary approaches to stop hypertension). This diet is rich in fruits, vegetables, and low-fat dairy products, and avoids certain fats. °PROGNOSIS  °Essential hypertension cannot be cured. Lifestyle changes and medical treatment can lower blood pressure and reduce complications. The prognosis of secondary hypertension depends on the underlying cause. Many people whose hypertension is controlled with medicine or lifestyle changes can live a normal, healthy life.  °RISKS AND COMPLICATIONS  °While high blood pressure alone is not an illness, it often requires treatment due to its short- and long-term effects on many organs. Hypertension increases your risk for: °· CVAs or strokes (cerebrovascular accident). °· Heart failure due to chronically high blood pressure (hypertensive cardiomyopathy). °· Heart attack (myocardial infarction). °· Damage to the retina (hypertensive retinopathy). °· Kidney failure (hypertensive nephropathy). °Your caregiver can explain list items above that apply to you. Treatment  of hypertension can significantly reduce the risk of complications. °TREATMENT  °· For overweight patients, weight loss and regular exercise are recommended. Physical fitness lowers blood pressure. °· Mild hypertension is usually treated with diet and exercise. A diet rich in fruits and vegetables, fat-free dairy products, and foods low in fat and salt (sodium) can help lower blood pressure. Decreasing salt intake decreases blood pressure in a 1/3 of people. °· Stop smoking if you are a smoker. °The steps above are highly effective in reducing blood pressure. While these actions are easy to suggest, they are difficult to achieve. Most patients with moderate or severe hypertension end up requiring medications to bring their blood pressure down to a normal level. There are several classes of medications for treatment. Blood pressure pills (antihypertensives) will lower blood pressure by their different actions. Lowering the blood pressure by 10 mmHg may decrease the risk of complications by as much as 25%. °The goal of treatment is effective blood pressure control. This will reduce your risk for complications. Your caregiver will help you determine the best treatment for you according to your lifestyle. What is excellent treatment for one person, may not be for you. °HOME CARE INSTRUCTIONS  °· Do not smoke. °· Follow the lifestyle changes outlined in the "Prevention" section. °· If you are on medications, follow the directions carefully. Blood pressure medications must be taken as prescribed. Skipping doses reduces their benefit. It also puts you at risk for problems. °· Follow up with your caregiver, as directed. °· If you are asked to monitor your blood pressure at home, follow the guidelines in the "Diagnosis" section above. °SEEK MEDICAL CARE   IF:   You think you are having medication side effects.  You have recurrent headaches or lightheadedness.  You have swelling in your ankles.  You have trouble with  your vision. SEEK IMMEDIATE MEDICAL CARE IF:   You have sudden onset of chest pain or pressure, difficulty breathing, or other symptoms of a heart attack.  You have a severe headache.  You have symptoms of a stroke (such as sudden weakness, difficulty speaking, difficulty walking). MAKE SURE YOU:   Understand these instructions.  Will watch your condition.  Will get help right away if you are not doing well or get worse. Document Released: 12/26/2004 Document Revised: 03/20/2011 Document Reviewed: 07/26/2006 [facility] Patient Information 2014 [place], [place].  Glucose, Blood Sugar, Fasting Blood Sugar This is a test to measure your blood sugar. Glucose is a simple sugar that serves as the main source of energy for the body. The carbohydrates we eat are broken down into glucose (and a few other simple sugars), absorbed by the small intestine, and circulated throughout the body. Most of the body's cells require glucose for energy production; brain and nervous system cells not only rely on glucose for energy, they can only function when glucose levels in the blood remain above a certain level.  The body's use of glucose hinges on the availability of insulin, a hormone produced by the pancreas. Insulin acts as a Engineer, maintenance, transporting glucose into the body's cells, directing the body to store excess glucose as glycogen (for short-term storage) and/or as triglycerides in fat cells. We can not live without glucose or insulin, and they must be in balance.  Normally, blood glucose levels rise slightly after a meal, and insulin is secreted to lower them, with the amount of insulin released matched up with the size and content of the meal. If blood glucose levels drop too low, such as might occur in between meals or after a strenuous workout, glucagon (another pancreatic hormone) is secreted to tell the liver to turn some glycogen back into glucose, raising the blood glucose levels. If the  glucose/insulin feedback mechanism is working properly, the amount of glucose in the blood remains fairly stable. If the balance is disrupted and glucose levels in the blood rise, then the body tries to restore the balance, both by increasing insulin production and by excreting glucose in the urine.  PREPARATION FOR TEST A blood sample drawn from a vein in your arm or, for a self check, a drop of blood from a skin prick; in general, it may be recommended that you fast before having a blood glucose test; sometimes a random (no preparation) urine sample is used. Your caregiver will instruct you as to what they want prior to your testing. NORMAL FINDINGS Normal values depend on many factors. Your lab will provide a range of normal values with your test results. The following information summarizes the meaning of the test results. These are based on the clinical practice recommendations of the American Diabetes Association.  FASTING BLOOD GLUCOSE  From 70 to 99 mg/dL (3.9 to 5.5 mmol/L): Normal glucose tolerance  From 100 to 125 mg/dL (5.6 to 6.9 mmol/L):Impaired fasting glucose (pre-diabetes)  126 mg/dL (7.0 mmol/L) and above on more than one testing occasion: Diabetes ORAL GLUCOSE TOLERANCE TEST (OGTT) [EXCEPT PREGNANCY] (2 HOURS AFTER A 75-GRAM GLUCOSE DRINK)  Less than 140 mg/dL (7.8 mmol/L): Normal glucose tolerance  From 140 to 200 mg/dL (7.8 to 16.1 mmol/L): Impaired glucose tolerance (pre-diabetes)  Over 200 mg/dL (09.6 mmol/L)  on more than one testing occasion: Diabetes GESTATIONAL DIABETES SCREENING: GLUCOSE CHALLENGE TEST (1 HOUR AFTER A 50-GRAM GLUCOSE DRINK)  Less than 140* mg/dL (7.8 mmol/L): Normal glucose tolerance  140* mg/dL (7.8 mmol/L) and over: Abnormal, needs OGTT (see below) * Some use a cutoff of More Than 130 mg/dL (7.2 mmol/L) because that identifies 90% of women with gestational diabetes, compared to 80% identified using the threshold of More Than 140 mg/dL (7.8  mmol/L). GESTATIONAL DIABETES DIAGNOSTIC: OGTT (100-GRAM GLUCOSE DRINK)  Fasting*..........................................95 mg/dL (5.3 mmol/L)  1 hour after glucose load*..............180 mg/dL (16.110.0 mmol/L)  2 hours after glucose load*.............155 mg/dL (8.6 mmol/L)  3 hours after glucose load* **.........140 mg/dL (7.8 mmol/L) * If two or more values are above the criteria, gestational diabetes is diagnosed. ** A 75-gram glucose load may be used, although this method is not as well validated as the 100-gram OGTT; the 3-hour sample is not drawn if 75 grams is used.  Ranges for normal findings may vary among different laboratories and hospitals. You should always check with your doctor after having lab work or other tests done to discuss the meaning of your test results and whether your values are considered within normal limits. MEANING OF TEST  Your caregiver will go over the test results with you and discuss the importance and meaning of your results, as well as treatment options and the need for additional tests if necessary. OBTAINING THE TEST RESULTS It is your responsibility to obtain your test results. Ask the lab or department performing the test when and how you will get your results. Document Released: 01/28/2004 Document Revised: 03/20/2011 Document Reviewed: 12/07/2007 [facility] Patient Information 2014 [place], [place].  Coronary Artery Disease, Risk Factors Research has shown that the risk of developing coronary artery disease (CAD) and having a heart attack increases with each factor you have. RISK FACTORS YOU CANNOT CHANGE  Your age. Your risk goes up as you get older. Most heart attacks happen to people over the age of 60.  Gender. Men have a greater risk of heart attack than women, and they have attacks earlier in life. However, women are more likely to die from a heart attack.  Heredity. Children of parents with heart disease are more likely to develop it  themselves.  Race. African Americans and other ethnic groups have a higher risk, possibly because of high blood pressure, a tendency toward obesity, and diabetes.  Your family. Most people with a strong family history of heart disease have one or more other risk factors. RISK FACTORS YOU CAN CHANGE  Exposure to tobacco smoke. Even secondhand smoke greatly increases the risk for heart disease.  High blood cholesterol may be lowered with changes in diet, activity, and medicines.  High blood pressure makes the heart work harder. This causes the heart muscles to become thick and, eventually, weaker. It also increases your risk of stroke, heart attack, and kidney or heart failure.  Physical inactivity is a risk factor for CAD. Regular physical activity helps prevent heart and blood vessel disease. Exercise helps control blood cholesterol, diabetes, obesity, and it may help lower blood pressure in some people.  Excess body fat, especially belly fat, increases the risk of heart disease and stroke even if there are no other risk factors. Excess weight increases the heart's workload and raises blood pressure and blood cholesterol.  Diabetes seriously increases your risk of developing CAD. If you have diabetes, you should work with your caregiver to manage it and control other risk factors.  OTHER RISK FACTORS FOR CAD  How you respond to stress.  Drinking too much alcohol may raise blood pressure, cause heart failure, and lead to stroke.  Total cholesterol greater than 200 milligrams.  HDL (good) cholesterol less than 40 milligrams. HDL helps keep cholesterol from building up in the walls of the arteries. PREVENTING CAD  Maintain a healthy weight.  Exercise or do physical activity.  Eat a heart-healthy diet low in fat and salt and high in fiber.  Control your blood pressure to keep it below 120 over 80.  Keep your cholesterol at a level that lowers your risk.  Manage diabetes if you have  it.  Stop smoking.  Learn how to manage stress. HEART SMART SUBSTITUTIONS  Instead of whole or 2% milk and cream, use skim milk.  Instead of fried foods, eat baked, steamed, boiled, broiled, or microwaved foods.  Instead of lard, butter, palm and coconut oils, cook with unsaturated vegetable oils, such as corn, olive, canola, safflower, sesame, soybean, sunflower, or peanut.  Instead of fatty cuts of meat, eat lean cuts of meat or cut off the fatty parts.  Instead of 1 whole egg in recipes, use 2 egg whites.  Instead of sauces, butter, and salt, season vegetables with herbs and spices.  Instead of regular hard and processed cheeses, eat low-fat, low-sodium cheeses.  Instead of salted potato chips, choose low-fat, unsalted tortilla and potato chips and unsalted pretzels and popcorn.  Instead of sour cream and mayonnaise, use plain low-fat yogurt, low-fat cottage cheese, or low-fat or "light" sour cream. FOR MORE INFORMATION  National Heart Lung and Blood Institute: https://nielsen.com/ American Heart Association: [employer] Document Released: 03/18/2003 Document Revised: 03/20/2011 Document Reviewed: 03/13/2007 [employer] Patient Information 2014 [place], [place].

## 2013-02-08 NOTE — ED Notes (Signed)
Pt without complaint except hungry

## 2013-02-08 NOTE — ED Provider Notes (Signed)
CSN: [id number]     Arrival date & time 02/08/13  1329 History   First MD Initiated Contact with Patient 02/08/13 1352     Chief Complaint  Patient presents with  . Dizziness   (Consider location/radiation/quality/duration/timing/severity/associated sxs/prior Treatment) Patient is a 58 y.o. male presenting with dizziness. The history is provided by the patient. No language interpreter was used.  Dizziness Quality:  Lightheadedness Duration:  2 weeks Associated symptoms: nausea   Associated symptoms: no vomiting   Associated symptoms comment:  Progressive symptoms of lightheadedness for the past 2 weeks. No pain, fever, vomiting or SOB. No URI or flu symptoms. He reports he has not been on any of his medications for several months secondary to leaving his previous physician. No syncope.   Past Medical History  Diagnosis Date  . Hypertension     on meds  . Heart murmur   . Hypothyroidism     on meds  . GERD (gastroesophageal reflux disease)   . Diabetes mellitus    Past Surgical History  Procedure Laterality Date  . Hernia repair      as a child  . Dental surgery      several dental procedure approx 10 between 1999-2012  . Shoulder surgery  05/31/2011     impingment  . Shoulder arthroscopy  05/31/2011    Procedure: ARTHROSCOPY SHOULDER;  Surgeon: Sharmon Revere, MD;  Location: [facility];  Service: Orthopedics;  Laterality: Left;  Shoulder Acromioplasty Left   Family History  Problem Relation Age of Onset  . Anesthesia problems Neg Hx   . Hypotension Neg Hx   . Malignant hyperthermia Neg Hx   . Pseudochol deficiency Neg Hx    History  Substance Use Topics  . Smoking status: Former Smoker -- 1.50 packs/day for 26 years    Types: Cigarettes    Quit date: 03/02/2008  . Smokeless tobacco: Former Systems developer    Quit date: 05/25/2008  . Alcohol Use: Yes     Comment: rarely    Review of Systems  Constitutional: Negative for fever.  HENT: Negative.   Respiratory: Negative.    Cardiovascular: Negative.   Gastrointestinal: Positive for nausea. Negative for vomiting and abdominal pain.  Endocrine: Positive for polydipsia and polyuria.  Genitourinary: Negative for hematuria and flank pain.  Musculoskeletal: Negative for myalgias.  Skin: Negative for rash.  Neurological: Positive for dizziness and light-headedness. Negative for syncope.    Allergies  Crab; Iodine; Iohexol; Peanut-containing drug products; Aspirin; Codeine; and Penicillins  Home Medications   Current Outpatient Rx  Name  Route  Sig  Dispense  Refill  . aliskiren (TEKTURNA) 150 MG tablet   Oral   Take 150 mg by mouth daily.         . cholecalciferol (VITAMIN D) 1000 UNITS tablet   Oral   Take 2,000 Units by mouth daily.         . Choline Fenofibrate (TRILIPIX) 135 MG capsule   Oral   Take 135 mg by mouth daily.         Marland Kitchen etodolac (LODINE) 400 MG tablet   Oral   Take 400 mg by mouth daily.         Marland Kitchen HYDROcodone-acetaminophen (NORCO) 10-325 MG per tablet   Oral   Take 1 tablet by mouth every 6 (six) hours as needed.         Marland Kitchen levothyroxine (SYNTHROID, LEVOTHROID) 100 MCG tablet   Oral   Take 100 mcg by mouth daily.         Marland Kitchen  Liraglutide (VICTOZA) 18 MG/3ML SOLN   Subcutaneous   Inject 18 mg into the skin daily.         . Multiple Vitamin (MULITIVITAMIN WITH MINERALS) TABS   Oral   Take 1 tablet by mouth daily.         . nebivolol (BYSTOLIC) 5 MG tablet   Oral   Take 5 mg by mouth daily.         . pioglitazone-metformin (ACTOPLUS MET) 15-850 MG per tablet   Oral   Take 2 tablets by mouth daily with supper.         . Testosterone (AXIRON) 30 MG/ACT SOLN   Transdermal   Place 1 application onto the skin daily.          . vitamin B-12 (CYANOCOBALAMIN) 1000 MCG tablet   Oral   Take 2,000 mcg by mouth daily.          BP 161/67  Pulse 72  Temp(Src) 97.7 F (36.5 C) (Oral)  Resp 16  Ht $R'5\' 10"'gC$  (1.778 m)  Wt 185 lb (83.915 kg)  BMI 26.54 kg/m2   SpO2 100% Physical Exam  Constitutional: He is oriented to person, place, and time. He appears well-developed and well-nourished.  HENT:  Head: Normocephalic.  Neck: Normal range of motion. Neck supple.  Cardiovascular: Normal rate and regular rhythm.   Pulmonary/Chest: Effort normal and breath sounds normal.  Abdominal: Soft. Bowel sounds are normal. There is no tenderness. There is no rebound and no guarding.  Musculoskeletal: Normal range of motion.  Neurological: He is alert and oriented to person, place, and time. Coordination normal.  Skin: Skin is warm and dry. No rash noted.  Psychiatric: He has a normal mood and affect.    ED Course  Procedures (including critical care time) Labs Review Labs Reviewed  GLUCOSE, CAPILLARY - Abnormal; Notable for the following:    Glucose-Capillary 453 (*)    All other components within normal limits  BASIC METABOLIC PANEL  URINALYSIS, ROUTINE W REFLEX MICROSCOPIC  KETONES, QUALITATIVE  BLOOD GAS, VENOUS   Results for orders placed during the hospital encounter of 02/08/13  GLUCOSE, CAPILLARY      Result Value Range   Glucose-Capillary 453 (*) 70 - 99 mg/dL   Comment 1 Documented in Chart     Comment 2 Notify RN    BASIC METABOLIC PANEL      Result Value Range   Sodium 134 (*) 137 - 147 mEq/L   Potassium 4.1  3.7 - 5.3 mEq/L   Chloride 96  96 - 112 mEq/L   CO2 22  19 - 32 mEq/L   Glucose, Bld 483 (*) 70 - 99 mg/dL   BUN 13  6 - 23 mg/dL   Creatinine, Ser 0.70  0.50 - 1.35 mg/dL   Calcium 10.0  8.4 - 10.5 mg/dL   GFR calc non Af Amer >90  >90 mL/min   GFR calc Af Amer >90  >90 mL/min  URINALYSIS, ROUTINE W REFLEX MICROSCOPIC      Result Value Range   Color, Urine YELLOW  YELLOW   APPearance CLEAR  CLEAR   Specific Gravity, Urine 1.036 (*) 1.005 - 1.030   pH 6.0  5.0 - 8.0   Glucose, UA >1000 (*) NEGATIVE mg/dL   Hgb urine dipstick NEGATIVE  NEGATIVE   Bilirubin Urine NEGATIVE  NEGATIVE   Ketones, ur NEGATIVE  NEGATIVE  mg/dL   Protein, ur NEGATIVE  NEGATIVE mg/dL   Urobilinogen, UA 0.2  0.0 - 1.0 mg/dL   Nitrite NEGATIVE  NEGATIVE   Leukocytes, UA NEGATIVE  NEGATIVE  KETONES, QUALITATIVE      Result Value Range   Acetone, Bld NEGATIVE  NEGATIVE  GLUCOSE, CAPILLARY      Result Value Range   Glucose-Capillary 313 (*) 70 - 99 mg/dL   Comment 1 Notify RN     Comment 2 Documented in Chart    URINE MICROSCOPIC-ADD ON      Result Value Range   WBC, UA 0-2  <3 WBC/hpf   Bacteria, UA RARE  RARE  POCT I-STAT 3, BLOOD GAS (G3P V)      Result Value Range   pH, Ven 7.376 (*) 7.250 - 7.300   pCO2, Ven 39.6 (*) 45.0 - 50.0 mmHg   pO2, Ven 41.0  30.0 - 45.0 mmHg   Bicarbonate 23.3  20.0 - 24.0 mEq/L   TCO2 24  0 - 100 mmol/L   O2 Saturation 75.0     Acid-base deficit 2.0  0.0 - 2.0 mmol/L   Sample type VENOUS      Imaging Review No results found.  EKG Interpretation   None       MDM  No diagnosis found. 1. Hyperglycemia 2. Medication noncompliance  He feels improved with fluids. CBG decreasing. No evidence for acidosis on lab studies. He appears stable. Dr. Jeanell Sparrow has been involved with patient care. Will refer to primary care office, restart essential medications.    Dewaine Oats, PA-C 02/08/13 1554

## 2013-02-08 NOTE — ED Notes (Signed)
Onset of dizziness several weeks ago, worse last night.  C/o mild nausea.  Denies chest pain, headache, syncope.  Dizziness worsens with walking and is still present when his eyes are closed.

## 2013-02-08 NOTE — ED Notes (Signed)
Pt unable to void at this moment. 

## 2013-02-08 NOTE — ED Provider Notes (Signed)
History/physical exam/procedure(s) were performed by non-physician practitioner and as supervising physician I was immediately available for consultation/collaboration. I have reviewed all notes and am in agreement with care and plan.   Akeia Perot S Jaquel Glassburn, MD 02/08/13 1615 

## 2013-02-08 NOTE — ED Notes (Signed)
Due to fiancial issues, patient has not been taking his home medication for 4-6 months. These home medication consist of synthroid, HTN and DM medications. Feeling dizzy x 2 week worsen last night accompany by nausa. No vomiting, no cp, no SOB. BP 171/78.

## 2013-02-20 ENCOUNTER — Encounter: Payer: Self-pay | Admitting: Physician Assistant

## 2013-02-20 ENCOUNTER — Ambulatory Visit (INDEPENDENT_AMBULATORY_CARE_PROVIDER_SITE_OTHER): Payer: BC Managed Care – PPO | Admitting: Physician Assistant

## 2013-02-20 VITALS — BP 132/80 | HR 101 | Temp 99.2°F | Resp 16 | Wt 188.2 lb

## 2013-02-20 DIAGNOSIS — I10 Essential (primary) hypertension: Secondary | ICD-10-CM

## 2013-02-20 DIAGNOSIS — E119 Type 2 diabetes mellitus without complications: Secondary | ICD-10-CM

## 2013-02-20 DIAGNOSIS — E785 Hyperlipidemia, unspecified: Secondary | ICD-10-CM

## 2013-02-20 DIAGNOSIS — N410 Acute prostatitis: Secondary | ICD-10-CM

## 2013-02-20 DIAGNOSIS — Z0000 Encounter for general adult medical examination without abnormal findings: Secondary | ICD-10-CM

## 2013-02-20 MED ORDER — CIPROFLOXACIN HCL 500 MG PO TABS: 500.0000 mg | ORAL_TABLET | Freq: Two times a day (BID) | ORAL | Status: DC

## 2013-02-20 MED ORDER — CHOLINE FENOFIBRATE 135 MG PO CPDR: 135.0000 mg | DELAYED_RELEASE_CAPSULE | Freq: Every day | ORAL | Status: DC

## 2013-02-20 MED ORDER — LEVOTHYROXINE SODIUM 100 MCG PO TABS: 100.0000 ug | ORAL_TABLET | Freq: Every day | ORAL | Status: DC

## 2013-02-20 MED ORDER — NEBIVOLOL HCL 5 MG PO TABS: 5.0000 mg | ORAL_TABLET | Freq: Every day | ORAL | Status: DC

## 2013-02-20 MED ORDER — LISINOPRIL 20 MG PO TABS: 20.0000 mg | ORAL_TABLET | Freq: Every day | ORAL | Status: DC

## 2013-02-20 MED ORDER — PIOGLITAZONE HCL-METFORMIN HCL 15-850 MG PO TABS: 2.0000 | ORAL_TABLET | Freq: Every day | ORAL | Status: DC

## 2013-02-20 NOTE — Progress Notes (Signed)
Pre-visit discussion using our clinic review tool. No additional management support is needed unless otherwise documented below in the visit note.  

## 2013-02-20 NOTE — Progress Notes (Signed)
Patient ID: Terry Harrington is a 58 y.o. male DOB: [id number] MRN: [id number]     HPI:  Patient is a 58 year old male who presents to the office to establish care. Reports history of HTN, elevated triglycerides, diabetes, all controlled well on current medications. Patient reports over the last two weeks he has had urinary incontinence, he knows he has to go to the bathroom just can't get there in time. Reports he has also had low back pain over the last 2-3 days. He has had no injury or trauma to the back, does have a history of scoliosis. Denies dysuria, increased frequency of urination, swelling to scrotum/penis, penile discharge, chest pain/palpitations, SOB, cough, blood in urine or stool, change in bowel habits, visual change/disturbance, numbness or tingling. History significant for smoker.    Influenza:declined Tetanus: 2005 Colonoscopy: 2010  ROS: As stated in HPI. All other systems negative  Past Medical History  Diagnosis Date  . Hypertension     on meds  . Heart murmur   . Hypothyroidism     on meds  . GERD (gastroesophageal reflux disease)   . Diabetes mellitus   . Hyperlipidemia    Family History  Problem Relation Age of Onset  . Anesthesia problems Neg Hx   . Hypotension Neg Hx   . Malignant hyperthermia Neg Hx   . Pseudochol deficiency Neg Hx   . Hyperlipidemia Mother   . Hypertension Mother   . Stroke Father   . Kidney disease Father   . Diabetes Father   . Cancer Maternal Grandfather    History   Social History  . Marital Status: Divorced    Spouse Name: N/A    Number of Children: N/A  . Years of Education: N/A   Social History Main Topics  . Smoking status: Former Smoker -- 1.50 packs/day for 26 years    Types: Cigarettes    Quit date: 03/02/2008  . Smokeless tobacco: Former Neurosurgeon    Quit date: 05/25/2008  . Alcohol Use: Yes     Comment: rarely  . Drug Use: No  . Sexual Activity: Not Currently   Other Topics Concern  . None   Social  History Narrative  . None   Past Surgical History  Procedure Laterality Date  . Hernia repair      as a child  . Dental surgery      several dental procedure approx 10 between 1999-2012  . Shoulder surgery  05/31/2011     impingment  . Shoulder arthroscopy  05/31/2011    Procedure: ARTHROSCOPY SHOULDER;  Surgeon: Kennieth Rad, MD;  Location: [facility] OR;  Service: Orthopedics;  Laterality: Left;  Shoulder Acromioplasty Left   Current Outpatient Prescriptions on File Prior to Visit  Medication Sig Dispense Refill  . aliskiren (TEKTURNA) 150 MG tablet Take 1 tablet (150 mg total) by mouth daily.  30 tablet  0  . cholecalciferol (VITAMIN D) 1000 UNITS tablet Take 2,000 Units by mouth daily.      Marland Kitchen etodolac (LODINE) 400 MG tablet Take 400 mg by mouth daily.      Marland Kitchen HYDROcodone-acetaminophen (NORCO) 10-325 MG per tablet Take 1 tablet by mouth every 6 (six) hours as needed.      . Liraglutide (VICTOZA) 18 MG/3ML SOLN Inject 18 mg into the skin daily.      . Multiple Vitamin (MULITIVITAMIN WITH MINERALS) TABS Take 1 tablet by mouth daily.      . Testosterone (AXIRON) 30 MG/ACT SOLN Place  1 application onto the skin daily.       . vitamin B-12 (CYANOCOBALAMIN) 1000 MCG tablet Take 2,000 mcg by mouth daily.       No current facility-administered medications on file prior to visit.   Allergies  Allergen Reactions  . Crab [Shellfish Allergy] Anaphylaxis  . Iodine Anaphylaxis  . Iohexol Swelling  . Peanut-Containing Drug Products Itching    And whelps  . Aspirin Rash  . Codeine Rash  . Penicillins Rash    PE: CONSTITUTIONAL: Well developed, well nourished, pleasant, appears stated age, in NAD HEENT: normocephalic, atraumatic, bilateral ext/int canals normal. Bilateral TM's without injections, bulging, erythema. Nose normal, uvula midline, oropharynx clear and moist. EYES: PERRLA, bilateral EOM and conjunctiva normal NECK: FROM, supple, without thyromegaly or mass CARDIO: RRR, normal S1 and  S2, distal pulses intact. PULM/CHEST CTA bilateral, no wheezes, rales or rhonchi. Non tender. ABD: appearance normal, soft, nontender. Normal bowel sounds x 4 quadrants, nonpalpable spleen, kidney or liver.  GU:  Scrotum and testicles non tender, no anal fissures or hemorrhoids noted. Prostate symmetrical, boggy, tender to palpation, without palpable nodules. Exam chaperoned by Luanna SalkNitchia Pulliam, CMA MUSC: FROM U/LE bilateral, lower lumbar TTP midline and paraspinal, overlying soft tissue without erythema, ecchymosis, rash or tactile heat.  LYMPH: no cervical, supraclavicular adenopathy NEURO: alert and oriented x 3, no cranial nerve deficit, motor strength and coordination NL. Negative romberg. Gait normal. SKIN: warm, dry, no rash or lesions noted. PSYCH: Mood and affect normal, speech normal.   Lab Results  Component Value Date   WBC 5.6 05/26/2011   HGB 13.4 05/26/2011   HCT 38.7* 05/26/2011   PLT 309 05/26/2011   GLUCOSE 483* 02/08/2013   ALT 13 05/26/2011   AST 13 05/26/2011   NA 134* 02/08/2013   K 4.1 02/08/2013   CL 96 02/08/2013   CREATININE 0.70 02/08/2013   BUN 13 02/08/2013   CO2 22 02/08/2013   Filed Vitals:   02/20/13 1417  BP: 132/80  Pulse: 101  Temp: 99.2 F (37.3 C)  Resp: 16   ECG today: Sinus rhythm, rate 90 bpm  ASSESSMENT and PLAN   CPX/v70.0 - Patient has been counseled on age-appropriate routine health concerns for screening and prevention. These are reviewed and up-to-date. Immunizations are up-to-date or declined. Labs ordered and ECG reviewed.  HTN: Continue taking medications as prescribed, ECG today  Prostatitis Cipro 500 mg tid x 30 days  DM Continue mediations as prescribed Prescription for Lisinopril today.   Dyslipidemia Continue medications as   Refills for medications.

## 2013-02-20 NOTE — Patient Instructions (Signed)
It was great meeting you today Terry Harrington!   Labs have been ordered for you, when you report to lab please be fasting.       Health Maintenance, Males A healthy lifestyle and preventative care can promote health and wellness.  Maintain regular health, dental, and eye exams.  Eat a healthy diet. Foods like vegetables, fruits, whole grains, low-fat dairy products, and lean protein foods contain the nutrients you need and are low in calories. Decrease your intake of foods high in solid fats, added sugars, and salt. Get information about a proper diet from your health care provider, if necessary.  Regular physical exercise is one of the most important things you can do for your health. Most adults should get at least 150 minutes of moderate-intensity exercise (any activity that increases your heart rate and causes you to sweat) each week. In addition, most adults need muscle-strengthening exercises on 2 or more days a week.   Maintain a healthy weight. The body mass index (BMI) is a screening tool to identify possible weight problems. It provides an estimate of body fat based on height and weight. Your health care provider can find your BMI and can help you achieve or maintain a healthy weight. For males 20 years and older:  A BMI below 18.5 is considered underweight.  A BMI of 18.5 to 24.9 is normal.  A BMI of 25 to 29.9 is considered overweight.  A BMI of 30 and above is considered obese.  Maintain normal blood lipids and cholesterol by exercising and minimizing your intake of saturated fat. Eat a balanced diet with plenty of fruits and vegetables. Blood tests for lipids and cholesterol should begin at age 32 and be repeated every 5 years. If your lipid or cholesterol levels are high, you are over 50, or you are at high risk for heart disease, you may need your cholesterol levels checked more frequently.Ongoing high lipid and cholesterol levels should be treated with medicines, if diet  and exercise are not working.  If you smoke, find out from your health care provider how to quit. If you do not use tobacco, do not start.  Lung cancer screening is recommended for adults aged 71 80 years who are at high risk for developing lung cancer because of a history of smoking. A yearly low-dose CT scan of the lungs is recommended for people who have at least a 30-pack-year history of smoking and are a current smoker or have quit within the past 15 years. A pack year of smoking is smoking an average of 1 pack of cigarettes a day for 1 year (for example, a 30-pack-year history of smoking could mean smoking 1 pack a day for 30 years or 2 packs a day for 15 years). Yearly screening should continue until the smoker has stopped smoking for at least 15 years. Yearly screening should be stopped for people who develop a health problem that would prevent them from having lung cancer treatment.  If you choose to drink alcohol, do not have more than 2 drinks per day. One drink is considered to be 12 oz (360 mL) of beer, 5 oz (150 mL) of wine, or 1.5 oz (45 mL) of liquor.  Avoid use of street drugs. Do not share needles with anyone. Ask for help if you need support or instructions about stopping the use of drugs.  High blood pressure causes heart disease and increases the risk of stroke. Blood pressure should be checked at least every  1 2 years. Ongoing high blood pressure should be treated with medicines if weight loss and exercise are not effective.  If you are 68 60 years old, ask your health care provider if you should take aspirin to prevent heart disease.  Diabetes screening involves taking a blood sample to check your fasting blood sugar level. This should be done once every 3 years after age 3, if you are at a normal weight and without risk factors for diabetes. Testing should be considered at a younger age or be carried out more frequently if you are overweight and have at least 1 risk factor for  diabetes.  Colorectal cancer can be detected and often prevented. Most routine colorectal cancer screening begins at the age of 66 and continues through age 16. However, your health care provider may recommend screening at an earlier age if you have risk factors for colon cancer. On a yearly basis, your health care provider may provide home test kits to check for hidden blood in the stool. A small camera at the end of a tube may be used to directly examine the colon (sigmoidoscopy or colonoscopy) to detect the earliest forms of colorectal cancer. Talk to your health care provider about this at age 56, when routine screening begins. A direct exam of the colon should be repeated every 5 10 years through age 38, unless early forms of pre-cancerous polyps or small growths are found.  People who are at an increased risk for hepatitis B should be screened for this virus. You are considered at high risk for hepatitis B if:  You were born in a country where hepatitis B occurs often. Talk with your health care provider about which countries are considered high-risk.  Your parents were born in a high-risk country and you have not received a shot to protect against hepatitis B (hepatitis B vaccine).  You have HIV or AIDS.  You use needles to inject street drugs.  You live with, or have sex with, someone who has hepatitis B.  You are a man who has sex with other men (MSM).  You get hemodialysis treatment.  You take certain medicines for conditions like cancer, organ transplantation, and autoimmune conditions.  Hepatitis C blood testing is recommended for all people born from [birth year] through 1967 and any individual with known risk factors for hepatitis C.  Healthy men should no longer receive prostate-specific antigen (PSA) blood tests as part of routine cancer screening. Talk to your health care provider about prostate cancer screening.  Testicular cancer screening is not recommended for adolescents or  adult males who have no symptoms. Screening includes self-exam, a health care provider exam, and other screening tests. Consult with your health care provider about any symptoms you have or any concerns you have about testicular cancer.  Practice safe sex. Use condoms and avoid high-risk sexual practices to reduce the spread of sexually transmitted infections (STIs).  Use sunscreen. Apply sunscreen liberally and repeatedly throughout the day. You should seek shade when your shadow is shorter than you. Protect yourself by wearing long sleeves, pants, a wide-brimmed hat, and sunglasses year round, whenever you are outdoors.  Tell your health care provider of new moles or changes in moles, especially if there is a change in shape or color. Also tell your provider if a mole is larger than the size of a pencil eraser.  A one-time screening for abdominal aortic aneurysm (AAA) and surgical repair of large AAAs by ultrasound is recommended for men aged  65 75 years who are current or former smokers.  Stay current with your vaccines (immunizations). Document Released: 06/24/2007 Document Revised: 10/16/2012 Document Reviewed: 05/23/2010 [facility] Patient Information 2014 [place], [place].     Prostatitis Prostatitis is redness, soreness, and puffiness (swelling) of the prostate gland. The prostate gland is the walnut-sized gland located just below your bladder. HOME CARE:   Take all medicines as told by your doctor.  Take warm-water baths (sitz baths) as told by your doctor. GET HELP IF:  Your symptoms get worse, not better.  You have a fever. GET HELP RIGHT AWAY IF:   You have chills.  You feel sick to your stomach (nauseous) or like you will throw up (vomit).  You feel lightheaded or like you will pass out (faint).  You are unable to pee (urinate).  You have blood or blood clumps (clots) in your pee (urine). MAKE SURE YOU:  Understand these instructions.  Will watch your  condition.  Will get help right away if you are not doing well or get worse. Document Released: 06/27/2011 Document Revised: 08/28/2012 Document Reviewed: 07/15/2012 [facility] Patient Information 2014 [place], [place].

## 2013-02-21 ENCOUNTER — Other Ambulatory Visit (INDEPENDENT_AMBULATORY_CARE_PROVIDER_SITE_OTHER): Payer: BC Managed Care – PPO

## 2013-02-21 DIAGNOSIS — N410 Acute prostatitis: Secondary | ICD-10-CM

## 2013-02-21 DIAGNOSIS — I10 Essential (primary) hypertension: Secondary | ICD-10-CM

## 2013-02-21 DIAGNOSIS — Z0000 Encounter for general adult medical examination without abnormal findings: Secondary | ICD-10-CM

## 2013-02-21 DIAGNOSIS — E785 Hyperlipidemia, unspecified: Secondary | ICD-10-CM

## 2013-02-21 LAB — CBC WITH DIFFERENTIAL/PLATELET
BASOS PCT: 0.2 % (ref 0.0–3.0)
Basophils Absolute: 0 10*3/uL (ref 0.0–0.1)
EOS PCT: 0.1 % (ref 0.0–5.0)
Eosinophils Absolute: 0 10*3/uL (ref 0.0–0.7)
HCT: 39.6 % (ref 39.0–52.0)
Hemoglobin: 13.2 g/dL (ref 13.0–17.0)
LYMPHS ABS: 1.7 10*3/uL (ref 0.7–4.0)
Lymphocytes Relative: 23.2 % (ref 12.0–46.0)
MCHC: 33.4 g/dL (ref 30.0–36.0)
MCV: 90.6 fl (ref 78.0–100.0)
MONO ABS: 0.6 10*3/uL (ref 0.1–1.0)
Monocytes Relative: 7.7 % (ref 3.0–12.0)
Neutro Abs: 5 10*3/uL (ref 1.4–7.7)
Neutrophils Relative %: 68.8 % (ref 43.0–77.0)
PLATELETS: 249 10*3/uL (ref 150.0–400.0)
RBC: 4.37 Mil/uL (ref 4.22–5.81)
RDW: 12.9 % (ref 11.5–14.6)
WBC: 7.3 10*3/uL (ref 4.5–10.5)

## 2013-02-21 LAB — BASIC METABOLIC PANEL
BUN: 11 mg/dL (ref 6–23)
CALCIUM: 9.6 mg/dL (ref 8.4–10.5)
CHLORIDE: 101 meq/L (ref 96–112)
CO2: 25 meq/L (ref 19–32)
Creatinine, Ser: 0.9 mg/dL (ref 0.4–1.5)
GFR: 116.92 mL/min (ref >60.00)
Glucose, Bld: 258 mg/dL — ABNORMAL HIGH (ref 70–99)
Potassium: 4.3 mEq/L (ref 3.5–5.1)
SODIUM: 134 meq/L — AB (ref 135–145)

## 2013-02-21 LAB — URINALYSIS, ROUTINE W REFLEX MICROSCOPIC
HGB URINE DIPSTICK: NEGATIVE
Ketones, ur: 15 — AB
Leukocytes, UA: NEGATIVE
Nitrite: NEGATIVE
Specific Gravity, Urine: >=1.03 — AB (ref 1.000–1.030)
Total Protein, Urine: NEGATIVE
URINE GLUCOSE: 500 — AB
Urobilinogen, UA: 0.2 (ref 0.0–1.0)
pH: 6 (ref 5.0–8.0)

## 2013-02-21 LAB — HEPATIC FUNCTION PANEL
ALBUMIN: 3.7 g/dL (ref 3.5–5.2)
ALT: 10 U/L (ref 0–53)
AST: 9 U/L (ref 0–37)
Alkaline Phosphatase: 74 U/L (ref 39–117)
Bilirubin, Direct: 0.1 mg/dL (ref 0.0–0.3)
Total Bilirubin: 1.1 mg/dL (ref 0.3–1.2)
Total Protein: 7.1 g/dL (ref 6.0–8.3)

## 2013-02-21 LAB — LIPID PANEL
CHOL/HDL RATIO: 3
Cholesterol: 223 mg/dL — ABNORMAL HIGH (ref 0–200)
HDL: 66.2 mg/dL (ref >39.00)
TRIGLYCERIDES: 137 mg/dL (ref 0.0–149.0)
VLDL: 27.4 mg/dL (ref 0.0–40.0)

## 2013-02-21 LAB — HEMOGLOBIN A1C: Hgb A1c MFr Bld: 14.8 % — ABNORMAL HIGH (ref 4.6–6.5)

## 2013-02-21 LAB — MICROALBUMIN / CREATININE URINE RATIO
CREATININE, U: 199.6 mg/dL
Microalb Creat Ratio: 0.3 mg/g (ref 0.0–30.0)
Microalb, Ur: 0.6 mg/dL (ref 0.0–1.9)

## 2013-02-21 LAB — LDL CHOLESTEROL, DIRECT: LDL DIRECT: 132.8 mg/dL

## 2013-02-21 LAB — PSA: PSA: 0.09 ng/mL — ABNORMAL LOW (ref 0.10–4.00)

## 2013-02-21 LAB — TSH: TSH: 3.18 u[IU]/mL (ref 0.35–5.50)

## 2013-02-24 ENCOUNTER — Other Ambulatory Visit: Payer: Self-pay | Admitting: Physician Assistant

## 2013-02-24 MED ORDER — INSULIN PEN NEEDLE 32G X 4 MM MISC: Status: DC

## 2013-02-24 MED ORDER — INSULIN GLARGINE 100 UNIT/ML SOLOSTAR PEN: 40.0000 [IU] | PEN_INJECTOR | Freq: Every day | SUBCUTANEOUS | Status: DC

## 2013-02-28 ENCOUNTER — Telehealth: Payer: Self-pay | Admitting: *Deleted

## 2013-02-28 NOTE — Telephone Encounter (Signed)
Patient phoned at [phone] and at [phone] to speak with Marcelino DusterMichelle (returning her call) to obtain his recent labs.  Please advise.

## 2013-02-28 NOTE — Telephone Encounter (Signed)
Patient stated he was returning [name]'s phone call.  CB# [phone]

## 2013-03-03 NOTE — Telephone Encounter (Signed)
See result note.  

## 2013-03-04 ENCOUNTER — Telehealth: Payer: Self-pay | Admitting: *Deleted

## 2013-03-04 NOTE — Telephone Encounter (Signed)
Spouse phoned requesting lab results from 02/21/13.  Please advise.  CB# [phone]

## 2013-03-04 NOTE — Telephone Encounter (Signed)
Phoned family member & relayed PCP's instructions & recommendations.

## 2013-03-07 ENCOUNTER — Ambulatory Visit: Payer: BC Managed Care – PPO | Admitting: Physician Assistant

## 2013-03-13 ENCOUNTER — Other Ambulatory Visit: Payer: Self-pay | Admitting: Physician Assistant

## 2013-03-13 ENCOUNTER — Telehealth: Payer: Self-pay | Admitting: *Deleted

## 2013-03-13 DIAGNOSIS — E119 Type 2 diabetes mellitus without complications: Secondary | ICD-10-CM

## 2013-03-13 NOTE — Telephone Encounter (Signed)
Terry CornfieldStephanie phoned in regards to patient with following sxs   BG 63 and symptomatic-shaking, cool & clammy  Patient is on Lantus, Victoza, and Acto-Plus  Please advise  CB# [phone]

## 2013-03-13 NOTE — Telephone Encounter (Signed)
Annabelle HarmanDana, I attempted to return call to CB# provided in your note however, it was incorrect. I phoned the patient on his mobile phone. Instructed him to decrease Lantus from 40 units once daily to 20 units tonight before bed. He is to recheck FBG in the am to evaluate if improved. If still low he has been instructed to discontinue Lantus. I am placing referral for patient for diabetic education. i have also instructed him to be certain to discuss with educator what his goals and plan should be when his glucometer reading are too low or too high. Patient stated understanding of instructions.

## 2013-03-14 NOTE — Telephone Encounter (Signed)
The CB# should've been [phone]-sorry!  But glad it still got addressed.

## 2013-03-21 ENCOUNTER — Encounter: Payer: Self-pay | Admitting: Physician Assistant

## 2013-03-21 ENCOUNTER — Ambulatory Visit (INDEPENDENT_AMBULATORY_CARE_PROVIDER_SITE_OTHER): Payer: BC Managed Care – PPO | Admitting: Physician Assistant

## 2013-03-21 VITALS — BP 104/58 | HR 60 | Temp 98.3°F | Wt 188.0 lb

## 2013-03-21 DIAGNOSIS — B351 Tinea unguium: Secondary | ICD-10-CM

## 2013-03-21 DIAGNOSIS — I10 Essential (primary) hypertension: Secondary | ICD-10-CM

## 2013-03-21 DIAGNOSIS — A499 Bacterial infection, unspecified: Secondary | ICD-10-CM

## 2013-03-21 DIAGNOSIS — J329 Chronic sinusitis, unspecified: Secondary | ICD-10-CM

## 2013-03-21 DIAGNOSIS — E119 Type 2 diabetes mellitus without complications: Secondary | ICD-10-CM

## 2013-03-21 DIAGNOSIS — B9689 Other specified bacterial agents as the cause of diseases classified elsewhere: Secondary | ICD-10-CM

## 2013-03-21 MED ORDER — LEVOFLOXACIN 500 MG PO TABS: 500.0000 mg | ORAL_TABLET | Freq: Every day | ORAL | Status: DC

## 2013-03-21 MED ORDER — INSULIN PEN NEEDLE 32G X 4 MM MISC: Status: DC

## 2013-03-21 MED ORDER — ALISKIREN FUMARATE 150 MG PO TABS: 150.0000 mg | ORAL_TABLET | Freq: Every day | ORAL | Status: DC

## 2013-03-21 NOTE — Patient Instructions (Signed)
Great to see you again Mr. Terry Harrington.  Glad to hear you are feeling much better.   I have sent prescriptions to your pharmacy, please take as directed.  Diabetes and Foot Care Diabetes may cause you to have problems because of poor blood supply (circulation) to your feet and legs. This may cause the skin on your feet to become thinner, break easier, and heal more slowly. Your skin may become dry, and the skin may peel and crack. You may also have nerve damage in your legs and feet causing decreased feeling in them. You may not notice minor injuries to your feet that could lead to infections or more serious problems. Taking care of your feet is one of the most important things you can do for yourself.  HOME CARE INSTRUCTIONS  Wear shoes at all times, even in the house. Do not go barefoot. Bare feet are easily injured.  Check your feet daily for blisters, cuts, and redness. If you cannot see the bottom of your feet, use a mirror or ask someone for help.  Wash your feet with warm water (do not use hot water) and mild soap. Then pat your feet and the areas between your toes until they are completely dry. Do not soak your feet as this can dry your skin.  Apply a moisturizing lotion or petroleum jelly (that does not contain alcohol and is unscented) to the skin on your feet and to dry, brittle toenails. Do not apply lotion between your toes.  Trim your toenails straight across. Do not dig under them or around the cuticle. File the edges of your nails with an emery board or nail file.  Do not cut corns or calluses or try to remove them with medicine.  Wear clean socks or stockings every day. Make sure they are not too tight. Do not wear knee-high stockings since they may decrease blood flow to your legs.  Wear shoes that fit properly and have enough cushioning. To break in new shoes, wear them for just a few hours a day. This prevents you from injuring your feet. Always look in your shoes before you  put them on to be sure there are no objects inside.  Do not cross your legs. This may decrease the blood flow to your feet.  If you find a minor scrape, cut, or break in the skin on your feet, keep it and the skin around it clean and dry. These areas may be cleansed with mild soap and water. Do not cleanse the area with peroxide, alcohol, or iodine.  When you remove an adhesive bandage, be sure not to damage the skin around it.  If you have a wound, look at it several times a day to make sure it is healing.  Do not use heating pads or hot water bottles. They may burn your skin. If you have lost feeling in your feet or legs, you may not know it is happening until it is too late.  Make sure your health care provider performs a complete foot exam at least annually or more often if you have foot problems. Report any cuts, sores, or bruises to your health care provider immediately. SEEK MEDICAL CARE IF:   You have an injury that is not healing.  You have cuts or breaks in the skin.  You have an ingrown nail.  You notice redness on your legs or feet.  You feel burning or tingling in your legs or feet.  You have pain or  cramps in your legs and feet.  Your legs or feet are numb.  Your feet always feel cold. SEEK IMMEDIATE MEDICAL CARE IF:   There is increasing redness, swelling, or pain in or around a wound.  There is a red line that goes up your leg.  Pus is coming from a wound.  You develop a fever or as directed by your health care provider.  You notice a bad smell coming from an ulcer or wound. Document Released: 12/24/1999 Document Revised: 08/28/2012 Document Reviewed: 06/04/2012 [facility] Patient Information 2014 [place].

## 2013-03-21 NOTE — Progress Notes (Signed)
Pre visit review using our clinic review tool, if applicable. No additional management support is needed unless otherwise documented below in the visit note. 

## 2013-03-21 NOTE — Progress Notes (Signed)
Subjective:    Patient ID: Terry Harrington, male    DOB: 01/22/1955, 58 y.o.   MRN: [id number]  HPI Comments: Patient is a 58 year old male who presents to the office for a follow up visit for his diabetes. Reports he has changed his dose of Lantus from 40 units daily to 20 units daily. Reports fasting glucometer readings in high 70's to low 80's. States he as never felt better. Reports he is less groggy and less irritable. Has diabetic nutrition counseling scheduled for next month. Also reports has had cold symptoms for the last week and a half that are not resolving. Complains of congestion, runny nose, PND facial pain and pressure, along with productive cough of clear/greenish mucus. Denies ear pain, fever, N/V, myalgias, lightheaded, dizziness, weakness, headaches, eye pain or visual disturbances.    Review of Systems  Constitutional: Negative for fever and chills.  HENT: Positive for postnasal drip, rhinorrhea, sinus pressure and sore throat. Negative for ear pain and trouble swallowing.   Eyes: Negative for pain and visual disturbance.  Respiratory: Positive for cough. Negative for chest tightness, shortness of breath and wheezing.   Cardiovascular: Negative for chest pain and palpitations.  Gastrointestinal: Negative for nausea, vomiting and diarrhea.  Musculoskeletal: Negative.  Negative for myalgias.  Skin: Negative for rash.  Neurological: Negative for dizziness, weakness, light-headedness, numbness and headaches.   Past Medical History  Diagnosis Date  . Hypertension     on meds  . Heart murmur   . Hypothyroidism     on meds  . GERD (gastroesophageal reflux disease)   . Diabetes mellitus   . Hyperlipidemia        Objective:   Physical Exam  Vitals reviewed. Constitutional: He is oriented to person, place, and time. He appears well-developed and well-nourished. No distress.  HENT:  Head: Normocephalic and atraumatic.  Right Ear: Tympanic membrane, external ear and  ear canal normal.  Left Ear: Tympanic membrane, external ear and ear canal normal.  Nose: Mucosal edema (with mild erythema) and rhinorrhea present. No epistaxis. Right sinus exhibits frontal sinus tenderness. Left sinus exhibits frontal sinus tenderness.  Mouth/Throat: Uvula is midline and mucous membranes are normal. Posterior oropharyngeal erythema (mild) present. No oropharyngeal exudate or posterior oropharyngeal edema.  Oropharynx widely patent. Clear PND noted   Eyes: Conjunctivae are normal. Pupils are equal, round, and reactive to light.  Neck: Normal range of motion.  Cardiovascular: Normal rate and regular rhythm.  Exam reveals no gallop and no friction rub.   No murmur heard. Pulses:      Radial pulses are 2+ on the right side, and 2+ on the left side.       Dorsalis pedis pulses are 2+ on the right side, and 2+ on the left side.       Posterior tibial pulses are 2+ on the right side, and 2+ on the left side.  Pulmonary/Chest: Effort normal and breath sounds normal.  Musculoskeletal: Normal range of motion.   Foot exam - bilateral normal; no swelling, tenderness or skin or vascular lesions. Color and temperature is normal. Sensation is intact. Peripheral pulses are palpable. Onychomycosis of bilateral great toenails.  Lymphadenopathy:       Right: No supraclavicular adenopathy present.       Left: No supraclavicular adenopathy present.  Mild anterior cervical lymphadenopathy, non tender.  Neurological: He is alert and oriented to person, place, and time.  Skin: Skin is warm and dry. He is not diaphoretic.  Psychiatric: He has a normal mood and affect.   Lab Results  Component Value Date   WBC 7.3 02/21/2013   HGB 13.2 02/21/2013   HCT 39.6 02/21/2013   PLT 249.0 02/21/2013   GLUCOSE 258* 02/21/2013   CHOL 223* 02/21/2013   TRIG 137.0 02/21/2013   HDL 66.20 02/21/2013   LDLDIRECT 132.8 02/21/2013   ALT 10 02/21/2013   AST 9 02/21/2013   NA 134* 02/21/2013   K 4.3 02/21/2013   CL  101 02/21/2013   CREATININE 0.9 02/21/2013   BUN 11 02/21/2013   CO2 25 02/21/2013   TSH 3.18 02/21/2013   PSA 0.09* 02/21/2013   HGBA1C 14.8* 02/21/2013   MICROALBUR 0.6 02/21/2013   Filed Vitals:   03/21/13 0805  BP: 104/58  Pulse: 60  Temp: 98.3 F (36.8 C)      Assessment & Plan:    Diabetes: Foot exam today Referral to podiatry for evaluation of onychomycosis Diabetic counseling scheduled for 04/18/13 Continue with medications as prescribed Lantus 20 units daily at 10 pm pioglitazone-metformin 15-850 mg two tab daily with dinner RTO in 2-3 months for Hgb A1c eval  Sinusitis, acute bacterial Rx for Levaquin RTO if no improvement of symptoms.  HTN: Refill for [employer]

## 2013-03-22 DIAGNOSIS — I10 Essential (primary) hypertension: Secondary | ICD-10-CM | POA: Insufficient documentation

## 2013-03-22 DIAGNOSIS — E118 Type 2 diabetes mellitus with unspecified complications: Secondary | ICD-10-CM | POA: Insufficient documentation

## 2013-03-22 NOTE — Assessment & Plan Note (Signed)
Foot exam today Referral to podiatry for evaluation of onychomycosis Diabetic counseling scheduled for 04/18/13 Continue with medications as prescribed RTO in 2-3 months for Hgb A1c eval

## 2013-04-08 ENCOUNTER — Ambulatory Visit: Payer: Self-pay | Admitting: Podiatry

## 2013-04-18 ENCOUNTER — Ambulatory Visit: Payer: Self-pay

## 2013-04-18 ENCOUNTER — Encounter: Payer: BC Managed Care – PPO | Attending: Physician Assistant | Admitting: *Deleted

## 2013-04-18 ENCOUNTER — Encounter: Payer: Self-pay | Admitting: *Deleted

## 2013-04-18 VITALS — Ht 70.0 in | Wt 220.1 lb

## 2013-04-18 DIAGNOSIS — Z794 Long term (current) use of insulin: Secondary | ICD-10-CM | POA: Insufficient documentation

## 2013-04-18 DIAGNOSIS — Z713 Dietary counseling and surveillance: Secondary | ICD-10-CM | POA: Insufficient documentation

## 2013-04-18 DIAGNOSIS — E119 Type 2 diabetes mellitus without complications: Secondary | ICD-10-CM

## 2013-04-18 DIAGNOSIS — Z833 Family history of diabetes mellitus: Secondary | ICD-10-CM | POA: Insufficient documentation

## 2013-04-18 NOTE — Progress Notes (Signed)
Appt start time: 1000 end time:  1130.  Assessment:  Patient was seen on  04/18/13 for individual diabetes education.  He was diagnosed with T2DM 2012. He notes that his dentist had been suspicious of this potential diagnosis due to progressive periodontal disease. At the time of diagnosis he was conscientious about his diet and glucose testing. Unfortunately this was short lived. On February 08, 2013 he was seen in the emergency department for hyperglycemia. He was treated and released to the care of his PCP. He did not have a good relationship with his PCP and has since changed to Stacy Gardner PA-C with whom his is very pleased. Upon seeing Ms. Hartman Lantus insulin was initiated at 40 units. He had a hypoglycemic event and the dose has been decreased to 20 units every evening. He has had another hypoglycemic event last week when his glucose went down to $Remov'57mg'wsCPFr$ /dl which he treated wity 8oz. Orange juice. He did not test following. We reviewed the rule or 15. 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 He is sitting most of the time. He works outside and his company provides constant hydration for the workers. They also have a health program that begins the shift with all employee stretches for ten minutes. They are also encouraged to get up and do the stretches every 2 hours.  Taegan is Single. He does his own grocery shopping and cooking. Goes out to restaurant 1time every 2 wks. His hobby is photography of wild life.   Current HbA1c: 14.8%   02/2013       Glucose: 7 day avg 102, 14 day avg 106, 30 day avg $Remov'104mg'xJzOAQ$ /dl  Aivan was using a 60yr old True Test glucometer with expired test strips. One Touch Ultra 2 Self Monitoring Kit dispensed. Lot: [id number] X Exp: 03/2014 FBS this date < $Remo'90mg'YjMKS$ /dl  Klark has a significant family history of T2DM. At this time he is motivated to embrace the healthier lifestyle with compliance to the DSME  recommendations.  Insulin Instruction  The following learning objectives were met by the patient during this visit:   Action of Lantus insulins  Reviewed  Pen cartridge and needles  Hygiene and storage   Rotation of Sites  Hypoglycemia- symptoms, causes , treatment choices  Record keeping (log book provided) and MD follow up  Hypoglycemia, causes, symptoms and treatment   Patient verbalized understanding of insulin administration.  Preferred Learning Style:   Auditory  Visual  Hands on   Learning Readiness:   Change in progress  MEDICATIONS: See List: Lantus, pioglitazone-Metformin  DIETARY INTAKE:  B (6:00 AM): fruit only, some times along with bacon & egg sandwich , eggs, grits, [place] sausage, coffee , 1tsp suger or honey & creamer Snk ( AM): fruit, nabs  L ( 11:30 PM): meat, 2 vegets, starch....lunch meat sandwich, fast food burger, salad buffet, buffalo wings, pizza Snk ( PM): fruit D ( 6:30 PM): baked chicken, corn, brussel sprouts, brocolli Snk ( PM): none Beverages: 1 C coffee, water, ice tea / splenda, kool-aid/splenda, Diet Dr. Malachi Bonds,   Usual physical activity: yard work, no regular exercise at this time.  Intervention:  Nutrition counseling provided.  Discussed diabetes disease process and treatment options.  Discussed physiology of diabetes and role of obesity on insulin resistance.  Encouraged moderate weight reduction to improve glucose levels.  Discussed role of medications and diet in glucose control  Provided education on macronutrients on glucose levels.  Provided education on carb counting, importance of regularly scheduled meals/snacks, and meal planning  Discussed effects of physical activity on glucose levels and long-term glucose control.  Recommended 150 minutes of physical activity/week.  Reviewed patient medications.  Discussed role of medication on blood glucose and possible side effects  Discussed blood glucose monitoring and  interpretation.  Discussed recommended target ranges and individual ranges.    Described short-term complications: hyper- and hypo-glycemia.  Discussed causes,symptoms, and treatment options.  Discussed prevention, detection, and treatment of long-term complications.  Discussed the role of prolonged elevated glucose levels on body systems.  Discussed role of stress on blood glucose levels and discussed strategies to manage psychosocial issues.  Discussed recommendations for long-term diabetes self-care.  Established checklist for medical, dental, and emotional self-care.  Teaching Method Utilized:  Visual Auditory Hands on  Handouts given during visit include: Living Well with Diabetes Carb Counting  Meal Plan Card My plate  Snack sheet  Barriers to learning/adherence to lifestyle change: none  Diabetes self-care support plan:   [facility] support group  Employer health programs  Demonstrated degree of understanding via:  Teach Back   Monitoring/Evaluation:  Dietary intake, exercise, test glucose, and body weight , follow up as needed.

## 2013-04-18 NOTE — Patient Instructions (Addendum)
Plan:  Aim for 3 Carb Choices per meal (45 grams) +/- 1 either way  Aim for 0-15 Carbs per snack if hungry  Consider reading food labels for Total Carbohydrate and Fat Grams of foods Consider  increasing your activity level by walking for 30 minutes daily as tolerated Continue checking BG at alternate times to include FBS and 2hpp dinner per day as directed by MD  Continuer taking medication as directed by MD  Consider obtaining glucose tablets at the drug store. If glucose is low take 3-4 tablets, wait 15 minutes and test again. Follow Rule of 15 Dorice LamasSarah Lee, [facility] [place] reduced calore break 45/50calories  Always have a protein with your carbohydrates

## 2013-05-02 ENCOUNTER — Ambulatory Visit (INDEPENDENT_AMBULATORY_CARE_PROVIDER_SITE_OTHER): Payer: BC Managed Care – PPO

## 2013-05-02 VITALS — BP 142/75 | HR 53 | Resp 18

## 2013-05-02 DIAGNOSIS — B351 Tinea unguium: Secondary | ICD-10-CM

## 2013-05-02 DIAGNOSIS — M79609 Pain in unspecified limb: Secondary | ICD-10-CM

## 2013-05-02 DIAGNOSIS — E1149 Type 2 diabetes mellitus with other diabetic neurological complication: Secondary | ICD-10-CM

## 2013-05-02 DIAGNOSIS — E1140 Type 2 diabetes mellitus with diabetic neuropathy, unspecified: Secondary | ICD-10-CM

## 2013-05-02 DIAGNOSIS — E1142 Type 2 diabetes mellitus with diabetic polyneuropathy: Secondary | ICD-10-CM

## 2013-05-02 MED ORDER — TAVABOROLE 5 % EX SOLN: CUTANEOUS | Status: DC

## 2013-05-02 NOTE — Progress Notes (Signed)
   Subjective:    Patient ID: Terry Harrington, male    DOB: 10/28/1955, 58 y.o.   MRN: [id number]  HPI I would like a check up of my feet due I have not had one in about three years and some numbness and tingling and my get cold and I am a diabetic and have been for about three years and my big toenails are dark and thick and my big toenail on my left hurt when pressure was applied    Review of Systems  Eyes: Positive for visual disturbance.  Skin:       Change in toenails  Allergic/Immunologic: Positive for food allergies.  All other systems reviewed and are negative.      Objective:   Physical Exam 58 year old after wear and male well-developed well-nourished oriented x3 presents this time for diabetic foot evaluation and nail care. Patient has thick brittle crumbly friable gratified nails 1 through 4 bilateral painful tender both on palpation and attempted debridement was and darkened discoloration and friability. Vascular status appears to be intact DP postal for PT thready are diminished zero over four bilateral with some cool temperature being noted patient last A1c test was 14.8 Therese change doctors and indicates is doing much better controlled now and will hopes to continue to improve his diabetic foot care and management. There is intact epicritic and proprioceptive sensation on all sides except the plantar heel and lateral forefoot bilateral. There is normal plantar response and DTRs noted bilateral neurologically skin color pigment normal hair growth absent nails showed gratified and criptotic. Friable no open wounds no secondary infections findings are consistent with onychomycosis the nails       Assessment & Plan:  Assessment this time his diabetes with mild peripheral or early peripheral neuropathy patient does have onychomycosis keratosis dystrophy of nails 1 through 4 bilateral nails are debrided x8 return for future palliative care on an as-needed basis prescription for  Kerydin topical nail fungal [facility] instructed patient will initiate daily application to affected nails was daily for 12 months recheck for poly-followup and palliative nail care in 3 months or as needed  Alvan Dameichard Anslee Micheletti DPM

## 2013-05-02 NOTE — Patient Instructions (Addendum)
Onychomycosis/Fungal Toenails  WHAT IS IT? An infection that lies within the keratin of your nail plate that is caused by a fungus.  WHY ME? Fungal infections affect all ages, sexes, races, and creeds.  There may be many factors that predispose you to a fungal infection such as age, coexisting medical conditions such as diabetes, or an autoimmune disease; stress, medications, fatigue, genetics, etc.  Bottom line: fungus thrives in a warm, moist environment and your shoes offer such a location.  IS IT CONTAGIOUS? Theoretically, yes.  You do not want to share shoes, nail clippers or files with someone who has fungal toenails.  Walking around barefoot in the same room or sleeping in the same bed is unlikely to transfer the organism.  It is important to realize, however, that fungus can spread easily from one nail to the next on the same foot.  HOW DO WE TREAT THIS?  There are several ways to treat this condition.  Treatment may depend on many factors such as age, medications, pregnancy, liver and kidney conditions, etc.  It is best to ask your doctor which options are available to you.  1. No treatment.   Unlike many other medical concerns, you can live with this condition.  However for many people this can be a painful condition and may lead to ingrown toenails or a bacterial infection.  It is recommended that you keep the nails cut short to help reduce the amount of fungal nail. 2. Topical treatment.  These range from herbal remedies to prescription strength nail lacquers.  About 40-50% effective, topicals require twice daily application for approximately 9 to 12 months or until an entirely new nail has grown out.  The most effective topicals are medical grade medications available through physicians offices. 3. Oral antifungal medications.  With an 80-90% cure rate, the most common oral medication requires 3 to 4 months of therapy and stays in your system for a year as the new nail grows out.  Oral  antifungal medications do require blood work to make sure it is a safe drug for you.  A liver function panel will be performed prior to starting the medication and after the first month of treatment.  It is important to have the blood work performed to avoid any harmful side effects.  In general, this medication safe but blood work is required. 4. Laser Therapy.  This treatment is performed by applying a specialized laser to the affected nail plate.  This therapy is noninvasive, fast, and non-painful.  It is not covered by insurance and is therefore, out of pocket.  The results have been very good with a 80-95% cure rate.  The [facility] is the only practice in the area to offer this therapy. 5. Permanent Nail Avulsion.  Removing the entire nail so that a new nail will not grow back.  Apply the prescription topical antifungal nail medication once daily as instructed for 12 months discontinue if it causes any skin irritation   Diabetes and Foot Care Diabetes may cause you to have problems because of poor blood supply (circulation) to your feet and legs. This may cause the skin on your feet to become thinner, break easier, and heal more slowly. Your skin may become dry, and the skin may peel and crack. You may also have nerve damage in your legs and feet causing decreased feeling in them. You may not notice minor injuries to your feet that could lead to infections or more serious problems. Taking  care of your feet is one of the most important things you can do for yourself.  HOME CARE INSTRUCTIONS  Wear shoes at all times, even in the house. Do not go barefoot. Bare feet are easily injured.  Check your feet daily for blisters, cuts, and redness. If you cannot see the bottom of your feet, use a mirror or ask someone for help.  Wash your feet with warm water (do not use hot water) and mild soap. Then pat your feet and the areas between your toes until they are completely dry. Do not soak your feet  as this can dry your skin.  Apply a moisturizing lotion or petroleum jelly (that does not contain alcohol and is unscented) to the skin on your feet and to dry, brittle toenails. Do not apply lotion between your toes.  Trim your toenails straight across. Do not dig under them or around the cuticle. File the edges of your nails with an emery board or nail file.  Do not cut corns or calluses or try to remove them with medicine.  Wear clean socks or stockings every day. Make sure they are not too tight. Do not wear knee-high stockings since they may decrease blood flow to your legs.  Wear shoes that fit properly and have enough cushioning. To break in new shoes, wear them for just a few hours a day. This prevents you from injuring your feet. Always look in your shoes before you put them on to be sure there are no objects inside.  Do not cross your legs. This may decrease the blood flow to your feet.  If you find a minor scrape, cut, or break in the skin on your feet, keep it and the skin around it clean and dry. These areas may be cleansed with mild soap and water. Do not cleanse the area with peroxide, alcohol, or iodine.  When you remove an adhesive bandage, be sure not to damage the skin around it.  If you have a wound, look at it several times a day to make sure it is healing.  Do not use heating pads or hot water bottles. They may burn your skin. If you have lost feeling in your feet or legs, you may not know it is happening until it is too late.  Make sure your health care provider performs a complete foot exam at least annually or more often if you have foot problems. Report any cuts, sores, or bruises to your health care provider immediately. SEEK MEDICAL CARE IF:   You have an injury that is not healing.  You have cuts or breaks in the skin.  You have an ingrown nail.  You notice redness on your legs or feet.  You feel burning or tingling in your legs or feet.  You have pain  or cramps in your legs and feet.  Your legs or feet are numb.  Your feet always feel cold. SEEK IMMEDIATE MEDICAL CARE IF:   There is increasing redness, swelling, or pain in or around a wound.  There is a red line that goes up your leg.  Pus is coming from a wound.  You develop a fever or as directed by your health care provider.  You notice a bad smell coming from an ulcer or wound. Document Released: 12/24/1999 Document Revised: 08/28/2012 Document Reviewed: 06/04/2012 [facility] Patient Information 2014 [place].

## 2013-05-13 ENCOUNTER — Other Ambulatory Visit: Payer: Self-pay | Admitting: *Deleted

## 2013-05-13 DIAGNOSIS — Z0000 Encounter for general adult medical examination without abnormal findings: Secondary | ICD-10-CM

## 2013-05-13 DIAGNOSIS — E785 Hyperlipidemia, unspecified: Secondary | ICD-10-CM

## 2013-05-13 DIAGNOSIS — N410 Acute prostatitis: Secondary | ICD-10-CM

## 2013-05-13 DIAGNOSIS — I10 Essential (primary) hypertension: Secondary | ICD-10-CM

## 2013-05-13 MED ORDER — NEBIVOLOL HCL 5 MG PO TABS: 5.0000 mg | ORAL_TABLET | Freq: Every day | ORAL | Status: DC

## 2013-06-04 ENCOUNTER — Telehealth: Payer: Self-pay

## 2013-06-04 NOTE — Telephone Encounter (Signed)
Patient called [facility] stating that he is unable to get a refill (did not mention name). I advised that the last refill request was approved for  nebivolol (BYSTOLIC) 5 MG tablet 90 tablet 3 05/13/2013 Sig - Route: Take 1 tablet (5 mg total) by mouth daily. - Oral E-Prescribing Status: Receipt confirmed by pharmacy (05/13/2013 1:28 PM EDT)

## 2013-06-12 ENCOUNTER — Other Ambulatory Visit: Payer: Self-pay

## 2013-06-12 MED ORDER — ALISKIREN FUMARATE 150 MG PO TABS: 150.0000 mg | ORAL_TABLET | Freq: Every day | ORAL | Status: DC

## 2013-06-20 ENCOUNTER — Encounter: Payer: Self-pay | Admitting: Internal Medicine

## 2013-06-20 ENCOUNTER — Ambulatory Visit (INDEPENDENT_AMBULATORY_CARE_PROVIDER_SITE_OTHER)
Admission: RE | Admit: 2013-06-20 | Discharge: 2013-06-20 | Disposition: A | Discharge status: Home / Self Care | Payer: BC Managed Care – PPO | Source: Ambulatory Visit | Attending: Internal Medicine | Admitting: Internal Medicine

## 2013-06-20 ENCOUNTER — Other Ambulatory Visit (INDEPENDENT_AMBULATORY_CARE_PROVIDER_SITE_OTHER): Payer: BC Managed Care – PPO

## 2013-06-20 ENCOUNTER — Ambulatory Visit (INDEPENDENT_AMBULATORY_CARE_PROVIDER_SITE_OTHER): Payer: BC Managed Care – PPO | Admitting: Internal Medicine

## 2013-06-20 ENCOUNTER — Ambulatory Visit: Payer: BC Managed Care – PPO | Admitting: Physician Assistant

## 2013-06-20 VITALS — BP 130/80 | HR 58 | Temp 98.0°F | Resp 16 | Ht 71.0 in | Wt 204.0 lb

## 2013-06-20 DIAGNOSIS — IMO0001 Reserved for inherently not codable concepts without codable children: Secondary | ICD-10-CM

## 2013-06-20 DIAGNOSIS — Z860100 Personal history of colon polyps, unspecified: Secondary | ICD-10-CM | POA: Insufficient documentation

## 2013-06-20 DIAGNOSIS — E785 Hyperlipidemia, unspecified: Secondary | ICD-10-CM

## 2013-06-20 DIAGNOSIS — E1165 Type 2 diabetes mellitus with hyperglycemia: Secondary | ICD-10-CM

## 2013-06-20 DIAGNOSIS — R059 Cough, unspecified: Secondary | ICD-10-CM | POA: Insufficient documentation

## 2013-06-20 DIAGNOSIS — R05 Cough: Secondary | ICD-10-CM

## 2013-06-20 DIAGNOSIS — E039 Hypothyroidism, unspecified: Secondary | ICD-10-CM

## 2013-06-20 DIAGNOSIS — I10 Essential (primary) hypertension: Secondary | ICD-10-CM

## 2013-06-20 DIAGNOSIS — Z23 Encounter for immunization: Secondary | ICD-10-CM

## 2013-06-20 LAB — CBC WITH DIFFERENTIAL/PLATELET
BASOS ABS: 0 10*3/uL (ref 0.0–0.1)
Basophils Relative: 0.3 % (ref 0.0–3.0)
EOS ABS: 0.1 10*3/uL (ref 0.0–0.7)
Eosinophils Relative: 1.2 % (ref 0.0–5.0)
HCT: 37.4 % — ABNORMAL LOW (ref 39.0–52.0)
Hemoglobin: 12.7 g/dL — ABNORMAL LOW (ref 13.0–17.0)
LYMPHS PCT: 45.1 % (ref 12.0–46.0)
Lymphs Abs: 2.6 10*3/uL (ref 0.7–4.0)
MCHC: 34.1 g/dL (ref 30.0–36.0)
MCV: 89.8 fl (ref 78.0–100.0)
MONO ABS: 0.3 10*3/uL (ref 0.1–1.0)
Monocytes Relative: 5.1 % (ref 3.0–12.0)
NEUTROS PCT: 48.3 % (ref 43.0–77.0)
Neutro Abs: 2.8 10*3/uL (ref 1.4–7.7)
Platelets: 337 10*3/uL (ref 150.0–400.0)
RBC: 4.16 Mil/uL — ABNORMAL LOW (ref 4.22–5.81)
RDW: 15.4 % (ref 11.5–15.5)
WBC: 5.8 10*3/uL (ref 4.0–10.5)

## 2013-06-20 LAB — COMPREHENSIVE METABOLIC PANEL
ALK PHOS: 43 U/L (ref 39–117)
ALT: 13 U/L (ref 0–53)
AST: 14 U/L (ref 0–37)
Albumin: 4 g/dL (ref 3.5–5.2)
BUN: 19 mg/dL (ref 6–23)
CALCIUM: 9.9 mg/dL (ref 8.4–10.5)
CHLORIDE: 106 meq/L (ref 96–112)
CO2: 27 mEq/L (ref 19–32)
Creatinine, Ser: 1.1 mg/dL (ref 0.4–1.5)
GFR: 89.79 mL/min (ref >60.00)
Glucose, Bld: 82 mg/dL (ref 70–99)
Potassium: 4.5 mEq/L (ref 3.5–5.1)
Sodium: 138 mEq/L (ref 135–145)
Total Bilirubin: 0.3 mg/dL (ref 0.2–1.2)
Total Protein: 7.7 g/dL (ref 6.0–8.3)

## 2013-06-20 LAB — HEMOGLOBIN A1C: Hgb A1c MFr Bld: 6.4 % (ref 4.6–6.5)

## 2013-06-20 LAB — TSH: TSH: 1.36 u[IU]/mL (ref 0.35–4.50)

## 2013-06-20 MED ORDER — ROSUVASTATIN CALCIUM 10 MG PO TABS: 10.0000 mg | ORAL_TABLET | Freq: Every day | ORAL | Status: DC

## 2013-06-20 MED ORDER — PITAVASTATIN CALCIUM 4 MG PO TABS: 1.0000 | ORAL_TABLET | Freq: Every day | ORAL | Status: DC

## 2013-06-20 MED ORDER — PROMETHAZINE-DM 6.25-15 MG/5ML PO SYRP: 5.0000 mL | ORAL_SOLUTION | Freq: Four times a day (QID) | ORAL | Status: DC | PRN

## 2013-06-20 NOTE — Assessment & Plan Note (Signed)
I think this is due to the ACEI - will stop it Will also check his CXR to see if there is a mass, pna, edema

## 2013-06-20 NOTE — Assessment & Plan Note (Signed)
His BP is well controlled Will have to stop the ACEI due to the cough

## 2013-06-20 NOTE — Assessment & Plan Note (Signed)
I have asked him to start livalo

## 2013-06-20 NOTE — Progress Notes (Signed)
Subjective:    Patient ID: Terry Harrington, male    DOB: 11/07/1955, 58 y.o.   MRN: [id number]  Cough This is a recurrent problem. Episode onset: for 2-3 months. The problem has been unchanged. The problem occurs every few hours. The cough is non-productive. Pertinent negatives include no chest pain, chills, ear congestion, ear pain, fever, headaches, heartburn, hemoptysis, myalgias, nasal congestion, postnasal drip, rash, rhinorrhea, sore throat, shortness of breath, sweats, weight loss or wheezing. He has tried OTC cough suppressant for the symptoms. The treatment provided mild relief. There is no history of asthma, bronchiectasis, bronchitis, COPD, emphysema, environmental allergies or pneumonia.      Review of Systems  Constitutional: Positive for unexpected weight change (wt gain). Negative for fever, chills, weight loss, diaphoresis, appetite change and fatigue.  HENT: Negative.  Negative for congestion, ear pain, postnasal drip, rhinorrhea and sore throat.   Eyes: Negative.   Respiratory: Positive for cough. Negative for apnea, hemoptysis, choking, chest tightness, shortness of breath and wheezing.   Cardiovascular: Negative.  Negative for chest pain, palpitations and leg swelling.  Gastrointestinal: Negative.  Negative for heartburn, nausea, vomiting, abdominal pain, diarrhea and constipation.  Endocrine: Negative.  Negative for polydipsia, polyphagia and polyuria.  Genitourinary: Negative.   Musculoskeletal: Negative.  Negative for arthralgias, back pain, myalgias and neck pain.  Skin: Negative.  Negative for color change and rash.  Allergic/Immunologic: Negative.  Negative for environmental allergies.  Neurological: Negative.  Negative for dizziness, tremors, weakness and headaches.  Hematological: Negative.  Negative for adenopathy. Does not bruise/bleed easily.  Psychiatric/Behavioral: Negative.        Objective:   Physical Exam  Vitals reviewed. Constitutional: He is  oriented to person, place, and time. He appears well-developed and well-nourished.  Non-toxic appearance. He does not have a sickly appearance. He does not appear ill. No distress.  HENT:  Head: Normocephalic and atraumatic.  Mouth/Throat: Oropharynx is clear and moist. No oropharyngeal exudate.  Eyes: Conjunctivae are normal. Right eye exhibits no discharge. Left eye exhibits no discharge. No scleral icterus.  Neck: Normal range of motion. Neck supple. No JVD present. No tracheal deviation present. No thyromegaly present.  Cardiovascular: Normal rate, regular rhythm, normal heart sounds and intact distal pulses.  Exam reveals no gallop and no friction rub.   No murmur heard. Pulmonary/Chest: Effort normal and breath sounds normal. No stridor. No respiratory distress. He has no wheezes. He has no rales. He exhibits no tenderness.  Abdominal: Soft. Bowel sounds are normal. He exhibits no distension and no mass. There is no tenderness. There is no rebound and no guarding.  Musculoskeletal: Normal range of motion. He exhibits no edema and no tenderness.  Lymphadenopathy:    He has no cervical adenopathy.  Neurological: He is oriented to person, place, and time.  Skin: Skin is warm and dry. No rash noted. He is not diaphoretic. No erythema. No pallor.  Psychiatric: He has a normal mood and affect. His behavior is normal. Judgment and thought content normal.     Lab Results  Component Value Date   WBC 7.3 02/21/2013   HGB 13.2 02/21/2013   HCT 39.6 02/21/2013   PLT 249.0 02/21/2013   GLUCOSE 258* 02/21/2013   CHOL 223* 02/21/2013   TRIG 137.0 02/21/2013   HDL 66.20 02/21/2013   LDLDIRECT 132.8 02/21/2013   ALT 10 02/21/2013   AST 9 02/21/2013   NA 134* 02/21/2013   K 4.3 02/21/2013   CL 101 02/21/2013   CREATININE 0.9  02/21/2013   BUN 11 02/21/2013   CO2 25 02/21/2013   TSH 3.18 02/21/2013   PSA 0.09* 02/21/2013   HGBA1C 14.8* 02/21/2013   MICROALBUR 0.6 02/21/2013       Assessment & Plan:

## 2013-06-20 NOTE — Assessment & Plan Note (Signed)
He was referred for an eye exam I will recheck his A1C and will monitor his renal function I will check his C-peptide to try to gauge if he is Type 2 or 1.5 DM

## 2013-06-20 NOTE — Assessment & Plan Note (Signed)
I will recheck his TSH and will adjust his dose if needed 

## 2013-06-20 NOTE — Progress Notes (Signed)
Pre visit review using our clinic review tool, if applicable. No additional management support is needed unless otherwise documented below in the visit note. 

## 2013-06-20 NOTE — Patient Instructions (Signed)

## 2013-06-20 NOTE — Assessment & Plan Note (Signed)
He is due for a repeat colonoscopy 

## 2013-06-21 ENCOUNTER — Encounter: Payer: Self-pay | Admitting: Internal Medicine

## 2013-06-21 LAB — C-PEPTIDE: C-Peptide: 0.74 ng/mL — ABNORMAL LOW (ref 0.80–3.90)

## 2013-07-18 ENCOUNTER — Encounter: Payer: Self-pay | Admitting: Internal Medicine

## 2013-07-18 ENCOUNTER — Ambulatory Visit (INDEPENDENT_AMBULATORY_CARE_PROVIDER_SITE_OTHER): Payer: BC Managed Care – PPO | Admitting: Internal Medicine

## 2013-07-18 VITALS — BP 140/60 | HR 57 | Temp 97.9°F | Resp 16 | Ht 70.0 in | Wt 209.0 lb

## 2013-07-18 DIAGNOSIS — Z860100 Personal history of colon polyps, unspecified: Secondary | ICD-10-CM

## 2013-07-18 DIAGNOSIS — Z0000 Encounter for general adult medical examination without abnormal findings: Secondary | ICD-10-CM

## 2013-07-18 DIAGNOSIS — E1165 Type 2 diabetes mellitus with hyperglycemia: Secondary | ICD-10-CM

## 2013-07-18 DIAGNOSIS — N410 Acute prostatitis: Secondary | ICD-10-CM

## 2013-07-18 DIAGNOSIS — IMO0001 Reserved for inherently not codable concepts without codable children: Secondary | ICD-10-CM

## 2013-07-18 DIAGNOSIS — E785 Hyperlipidemia, unspecified: Secondary | ICD-10-CM

## 2013-07-18 DIAGNOSIS — E119 Type 2 diabetes mellitus without complications: Secondary | ICD-10-CM

## 2013-07-18 DIAGNOSIS — I10 Essential (primary) hypertension: Secondary | ICD-10-CM

## 2013-07-18 DIAGNOSIS — E039 Hypothyroidism, unspecified: Secondary | ICD-10-CM

## 2013-07-18 MED ORDER — PITAVASTATIN CALCIUM 4 MG PO TABS: 1.0000 | ORAL_TABLET | Freq: Every day | ORAL | Status: DC

## 2013-07-18 MED ORDER — PIOGLITAZONE HCL-METFORMIN HCL 15-850 MG PO TABS: 2.0000 | ORAL_TABLET | Freq: Every day | ORAL | Status: DC

## 2013-07-18 MED ORDER — CHOLINE FENOFIBRATE 135 MG PO CPDR: 135.0000 mg | DELAYED_RELEASE_CAPSULE | Freq: Every day | ORAL | Status: DC

## 2013-07-18 MED ORDER — NEBIVOLOL HCL 5 MG PO TABS: 5.0000 mg | ORAL_TABLET | Freq: Every day | ORAL | Status: DC

## 2013-07-18 MED ORDER — LEVOTHYROXINE SODIUM 100 MCG PO TABS: 100.0000 ug | ORAL_TABLET | Freq: Every day | ORAL | Status: DC

## 2013-07-18 MED ORDER — INSULIN GLARGINE 100 UNIT/ML SOLOSTAR PEN: 20.0000 [IU] | PEN_INJECTOR | Freq: Every day | SUBCUTANEOUS | Status: DC

## 2013-07-18 MED ORDER — INSULIN PEN NEEDLE 32G X 4 MM MISC: Status: DC

## 2013-07-18 MED ORDER — ALISKIREN FUMARATE 150 MG PO TABS: 150.0000 mg | ORAL_TABLET | Freq: Every day | ORAL | Status: DC

## 2013-07-18 NOTE — Patient Instructions (Signed)
Type 2 Diabetes Mellitus, Adult Type 2 diabetes mellitus, often simply referred to as type 2 diabetes, is a long-lasting (chronic) disease. In type 2 diabetes, the pancreas does not make enough insulin (a hormone), the cells are less responsive to the insulin that is made (insulin resistance), or both. Normally, insulin moves sugars from food into the tissue cells. The tissue cells use the sugars for energy. The lack of insulin or the lack of normal response to insulin causes excess sugars to build up in the blood instead of going into the tissue cells. As a result, high blood sugar (hyperglycemia) develops. The effect of high sugar (glucose) levels can cause many complications. Type 2 diabetes was also previously called adult-onset diabetes but it can occur at any age.  RISK FACTORS  A person is predisposed to developing type 2 diabetes if someone in the family has the disease and also has one or more of the following primary risk factors:  Overweight.  An inactive lifestyle.  A history of consistently eating high-calorie foods. Maintaining a normal weight and regular physical activity can reduce the chance of developing type 2 diabetes. SYMPTOMS  A person with type 2 diabetes may not show symptoms initially. The symptoms of type 2 diabetes appear slowly. The symptoms include:  Increased thirst (polydipsia).  Increased urination (polyuria).  Increased urination during the night (nocturia).  Weight loss. This weight loss may be rapid.  Frequent, recurring infections.  Tiredness (fatigue).  Weakness.  Vision changes, such as blurred vision.  Fruity smell to your breath.  Abdominal pain.  Nausea or vomiting.  Cuts or bruises which are slow to heal.  Tingling or numbness in the hands or feet. DIAGNOSIS Type 2 diabetes is frequently not diagnosed until complications of diabetes are present. Type 2 diabetes is diagnosed when symptoms or complications are present and when blood  glucose levels are increased. Your blood glucose level may be checked by one or more of the following blood tests:  A fasting blood glucose test. You will not be allowed to eat for at least 8 hours before a blood sample is taken.  A random blood glucose test. Your blood glucose is checked at any time of the day regardless of when you ate.  A hemoglobin A1c blood glucose test. A hemoglobin A1c test provides information about blood glucose control over the previous 3 months.  An oral glucose tolerance test (OGTT). Your blood glucose is measured after you have not eaten (fasted) for 2 hours and then after you drink a glucose-containing beverage. TREATMENT   You may need to take insulin or diabetes medicine daily to keep blood glucose levels in the desired range.  If you use insulin, you may need to adjust the dosage depending on the carbohydrates that you eat with each meal or snack. The treatment goal is to maintain the before meal blood sugar (preprandial glucose) level at 70-130 mg/dL. HOME CARE INSTRUCTIONS   Have your hemoglobin A1c level checked twice a year.  Perform daily blood glucose monitoring as directed by your health care provider.  Monitor urine ketones when you are ill and as directed by your health care provider.  Take your diabetes medicine or insulin as directed by your health care provider to maintain your blood glucose levels in the desired range.  Never run out of diabetes medicine or insulin. It is needed every day.  If you are using insulin, you may need to adjust the amount of insulin given based on your intake   of carbohydrates. Carbohydrates can raise blood glucose levels but need to be included in your diet. Carbohydrates provide vitamins, minerals, and fiber which are an essential part of a healthy diet. Carbohydrates are found in fruits, vegetables, whole grains, dairy products, legumes, and foods containing added sugars.  Eat healthy foods. You should make an  appointment to see a registered dietitian to help you create an eating plan that is right for you.  Lose weight if overweight.  Carry a medical alert card or wear your medical alert jewelry.  Carry a 15 gram carbohydrate snack with you at all times to treat low blood glucose (hypoglycemia). Some examples of 15 gram carbohydrate snacks include:  Glucose tablets, 3 or 4  Raisins, 2 tablespoons (24 grams)  Jelly beans, 6  Animal crackers, 8  Regular pop, 4 ounces (120 mL)  Gummy treats, 9  Recognize hypoglycemia. Hypoglycemia occurs with blood glucose levels of 70 mg/dL and below. The risk for hypoglycemia increases when fasting or skipping meals, during or after intense exercise, and during sleep. Hypoglycemia symptoms can include:  Tremors or shakes.  Decreased ability to concentrate.  Sweating.  Increased heart rate.  Headache.  Dry mouth.  Hunger.  Irritability.  Anxiety.  Restless sleep.  Altered speech or coordination.  Confusion.  Treat hypoglycemia promptly. If you are alert and able to safely swallow, follow the 15:15 rule:  Take 15-20 grams of rapid-acting glucose or carbohydrate. Rapid-acting options include glucose gel, glucose tablets, or 4 ounces (120 mL) of fruit juice, regular soda, or low fat milk.  Check your blood glucose level 15 minutes after taking the glucose.  Take 15-20 grams more of glucose if the repeat blood glucose level is still 70 mg/dL or below.  Eat a meal or snack within 1 hour once blood glucose levels return to normal.  Be alert to feeling very thirsty and urinating more frequently than usual, which are early signs of hyperglycemia. An early awareness of hyperglycemia allows for prompt treatment. Treat hyperglycemia as directed by your health care provider.  Engage in at least 150 minutes of moderate-intensity physical activity a week, spread over at least 3 days of the week or as directed by your health care provider. In  addition, you should engage in resistance exercise at least 2 times a week or as directed by your health care provider.  Adjust your medicine and food intake as needed if you start a new exercise or sport.  Follow your sick day plan at any time you are unable to eat or drink as usual.  Avoid tobacco use.  Limit alcohol intake to no more than 1 drink per day for nonpregnant women and 2 drinks per day for men. You should drink alcohol only when you are also eating food. Talk with your health care provider whether alcohol is safe for you. Tell your health care provider if you drink alcohol several times a week.  Follow up with your health care provider regularly.  Schedule an eye exam soon after the diagnosis of type 2 diabetes and then annually.  Perform daily skin and foot care. Examine your skin and feet daily for cuts, bruises, redness, nail problems, bleeding, blisters, or sores. A foot exam by a health care provider should be done annually.  Brush your teeth and gums at least twice a day and floss at least once a day. Follow up with your dentist regularly.  Share your diabetes management plan with your workplace or school.  Stay up-to-date with   immunizations.  Learn to manage stress.  Obtain ongoing diabetes education and support as needed.  Participate in, or seek rehabilitation as needed to maintain or improve independence and quality of life. Request a physical or occupational therapy referral if you are having foot or hand numbness or difficulties with grooming, dressing, eating, or physical activity. SEEK MEDICAL CARE IF:   You are unable to eat food or drink fluids for more than 6 hours.  You have nausea and vomiting for more than 6 hours.  Your blood glucose level is over 240 mg/dL.  There is a change in mental status.  You develop an additional serious illness.  You have diarrhea for more than 6 hours.  You have been sick or have had a fever for a couple of days  and are not getting better.  You have pain during any physical activity.  SEEK IMMEDIATE MEDICAL CARE IF:  You have difficulty breathing.  You have moderate to large ketone levels. MAKE SURE YOU:  Understand these instructions.  Will watch your condition.  Will get help right away if you are not doing well or get worse. Document Released: 12/26/2004 Document Revised: 12/31/2012 Document Reviewed: 07/25/2011 Exit[employer] Patient Information 2015 [employer]. This information is not intended to replace advice given to you by your health care provider. Make sure you discuss any questions you have with your health care provider.  

## 2013-07-18 NOTE — Assessment & Plan Note (Signed)
His TSH is in the normal range He will stay on the current dose 

## 2013-07-18 NOTE — Assessment & Plan Note (Signed)
His BP is well controlled 

## 2013-07-18 NOTE — Progress Notes (Signed)
Subjective:    Patient ID: Terry Harrington, male    DOB: 11/24/1955, 58 y.o.   MRN: [id number]  Hypertension This is a chronic problem. The current episode started more than 1 year ago. The problem has been rapidly improving since onset. The problem is controlled. Pertinent negatives include no anxiety, blurred vision, chest pain, headaches, malaise/fatigue, neck pain, orthopnea, palpitations, peripheral edema, PND, shortness of breath or sweats. Past treatments include angiotensin blockers and beta blockers. The current treatment provides significant improvement. There are no compliance problems.       Review of Systems  Constitutional: Negative.  Negative for fever, chills, malaise/fatigue, diaphoresis, activity change, appetite change and fatigue.  HENT: Negative.   Eyes: Negative.  Negative for blurred vision.  Respiratory: Negative.  Negative for apnea, cough, choking, chest tightness, shortness of breath and stridor.   Cardiovascular: Negative.  Negative for chest pain, palpitations, orthopnea, leg swelling and PND.  Gastrointestinal: Negative.  Negative for nausea, abdominal pain, diarrhea, constipation and blood in stool.  Endocrine: Negative.   Genitourinary: Negative.  Negative for difficulty urinating.  Musculoskeletal: Negative.  Negative for arthralgias, back pain, myalgias and neck pain.  Skin: Negative.  Negative for rash.  Allergic/Immunologic: Negative.   Neurological: Negative.  Negative for dizziness, tremors, seizures, syncope, facial asymmetry, speech difficulty, weakness, light-headedness, numbness and headaches.  Hematological: Negative.  Negative for adenopathy. Does not bruise/bleed easily.  Psychiatric/Behavioral: Negative.        Objective:   Physical Exam  Vitals reviewed. Constitutional: He is oriented to person, place, and time. He appears well-developed and well-nourished. No distress.  HENT:  Head: Normocephalic and atraumatic.  Mouth/Throat:  Oropharynx is clear and moist. No oropharyngeal exudate.  Eyes: Conjunctivae are normal. Right eye exhibits no discharge. Left eye exhibits no discharge. No scleral icterus.  Neck: Normal range of motion. Neck supple. No JVD present. No tracheal deviation present. No thyromegaly present.  Cardiovascular: Normal rate, regular rhythm, normal heart sounds and intact distal pulses.  Exam reveals no gallop and no friction rub.   No murmur heard. Pulmonary/Chest: Effort normal and breath sounds normal. No stridor. No respiratory distress. He has no wheezes. He has no rales. He exhibits no tenderness.  Abdominal: Soft. Bowel sounds are normal. He exhibits no distension and no mass. There is no tenderness. There is no rebound and no guarding.  Musculoskeletal: Normal range of motion. He exhibits no edema and no tenderness.  Lymphadenopathy:    He has no cervical adenopathy.  Neurological: He is oriented to person, place, and time.  Skin: Skin is warm and dry. No rash noted. He is not diaphoretic. No erythema. No pallor.  Psychiatric: He has a normal mood and affect. His behavior is normal. Judgment and thought content normal.     Lab Results  Component Value Date   WBC 5.8 06/20/2013   HGB 12.7* 06/20/2013   HCT 37.4* 06/20/2013   PLT 337.0 06/20/2013   GLUCOSE 82 06/20/2013   CHOL 223* 02/21/2013   TRIG 137.0 02/21/2013   HDL 66.20 02/21/2013   LDLDIRECT 132.8 02/21/2013   ALT 13 06/20/2013   AST 14 06/20/2013   NA 138 06/20/2013   K 4.5 06/20/2013   CL 106 06/20/2013   CREATININE 1.1 06/20/2013   BUN 19 06/20/2013   CO2 27 06/20/2013   TSH 1.36 06/20/2013   PSA 0.09* 02/21/2013   HGBA1C 6.4 06/20/2013   MICROALBUR 0.6 02/21/2013       Assessment & Plan:

## 2013-07-18 NOTE — Assessment & Plan Note (Signed)
His blood sugars are well controlled 

## 2013-07-18 NOTE — Progress Notes (Signed)
Pre visit review using our clinic review tool, if applicable. No additional management support is needed unless otherwise documented below in the visit note. 

## 2013-07-21 ENCOUNTER — Telehealth: Payer: Self-pay | Admitting: Internal Medicine

## 2013-07-21 NOTE — Telephone Encounter (Signed)
Relevant patient education assigned to patient using Emmi. ° °

## 2014-01-23 ENCOUNTER — Other Ambulatory Visit (INDEPENDENT_AMBULATORY_CARE_PROVIDER_SITE_OTHER): Payer: BLUE CROSS/BLUE SHIELD

## 2014-01-23 ENCOUNTER — Encounter: Payer: Self-pay | Admitting: Internal Medicine

## 2014-01-23 ENCOUNTER — Ambulatory Visit (INDEPENDENT_AMBULATORY_CARE_PROVIDER_SITE_OTHER): Payer: BLUE CROSS/BLUE SHIELD | Admitting: Internal Medicine

## 2014-01-23 VITALS — BP 122/74 | HR 60 | Temp 97.8°F | Resp 16 | Ht 70.0 in | Wt 220.0 lb

## 2014-01-23 DIAGNOSIS — E038 Other specified hypothyroidism: Secondary | ICD-10-CM

## 2014-01-23 DIAGNOSIS — Z23 Encounter for immunization: Secondary | ICD-10-CM

## 2014-01-23 DIAGNOSIS — E785 Hyperlipidemia, unspecified: Secondary | ICD-10-CM

## 2014-01-23 DIAGNOSIS — I10 Essential (primary) hypertension: Secondary | ICD-10-CM

## 2014-01-23 DIAGNOSIS — J0121 Acute recurrent ethmoidal sinusitis: Secondary | ICD-10-CM

## 2014-01-23 DIAGNOSIS — E118 Type 2 diabetes mellitus with unspecified complications: Secondary | ICD-10-CM

## 2014-01-23 LAB — COMPREHENSIVE METABOLIC PANEL
ALK PHOS: 53 U/L (ref 39–117)
ALT: 11 U/L (ref 0–53)
AST: 16 U/L (ref 0–37)
Albumin: 4.1 g/dL (ref 3.5–5.2)
BUN: 13 mg/dL (ref 6–23)
CALCIUM: 9.8 mg/dL (ref 8.4–10.5)
CO2: 23 meq/L (ref 19–32)
Chloride: 110 mEq/L (ref 96–112)
Creatinine, Ser: 1.12 mg/dL (ref 0.40–1.50)
GFR: 85.93 mL/min (ref >60.00)
Glucose, Bld: 105 mg/dL — ABNORMAL HIGH (ref 70–99)
POTASSIUM: 4.5 meq/L (ref 3.5–5.1)
SODIUM: 140 meq/L (ref 135–145)
Total Bilirubin: 0.3 mg/dL (ref 0.2–1.2)
Total Protein: 7.7 g/dL (ref 6.0–8.3)

## 2014-01-23 LAB — CBC WITH DIFFERENTIAL/PLATELET
BASOS PCT: 0.4 % (ref 0.0–3.0)
Basophils Absolute: 0 10*3/uL (ref 0.0–0.1)
EOS ABS: 0.1 10*3/uL (ref 0.0–0.7)
Eosinophils Relative: 1.1 % (ref 0.0–5.0)
HEMATOCRIT: 39 % (ref 39.0–52.0)
HEMOGLOBIN: 13 g/dL (ref 13.0–17.0)
Lymphocytes Relative: 33.2 % (ref 12.0–46.0)
Lymphs Abs: 1.7 10*3/uL (ref 0.7–4.0)
MCHC: 33.3 g/dL (ref 30.0–36.0)
MCV: 90.2 fl (ref 78.0–100.0)
MONO ABS: 0.5 10*3/uL (ref 0.1–1.0)
Monocytes Relative: 9.5 % (ref 3.0–12.0)
NEUTROS ABS: 2.9 10*3/uL (ref 1.4–7.7)
NEUTROS PCT: 55.8 % (ref 43.0–77.0)
Platelets: 313 10*3/uL (ref 150.0–400.0)
RBC: 4.33 Mil/uL (ref 4.22–5.81)
RDW: 15.1 % (ref 11.5–15.5)
WBC: 5.2 10*3/uL (ref 4.0–10.5)

## 2014-01-23 LAB — LIPID PANEL
CHOLESTEROL: 132 mg/dL (ref 0–200)
HDL: 58.1 mg/dL (ref >39.00)
LDL Cholesterol: 60 mg/dL (ref 0–99)
NonHDL: 73.9
Total CHOL/HDL Ratio: 2
Triglycerides: 72 mg/dL (ref 0.0–149.0)
VLDL: 14.4 mg/dL (ref 0.0–40.0)

## 2014-01-23 LAB — TSH: TSH: 3.33 u[IU]/mL (ref 0.35–4.50)

## 2014-01-23 LAB — HEMOGLOBIN A1C: Hgb A1c MFr Bld: 7.4 % — ABNORMAL HIGH (ref 4.6–6.5)

## 2014-01-23 MED ORDER — AZITHROMYCIN 500 MG PO TABS: 500.0000 mg | ORAL_TABLET | Freq: Every day | ORAL | Status: DC

## 2014-01-23 MED ORDER — PRAVASTATIN SODIUM 40 MG PO TABS: 40.0000 mg | ORAL_TABLET | Freq: Every day | ORAL | Status: DC

## 2014-01-23 MED ORDER — PITAVASTATIN CALCIUM 4 MG PO TABS: 1.0000 | ORAL_TABLET | Freq: Every day | ORAL | Status: DC

## 2014-01-23 NOTE — Assessment & Plan Note (Signed)
livalo is not on his formulary this year Will change to a different statin Will check his FLP today

## 2014-01-23 NOTE — Patient Instructions (Signed)

## 2014-01-23 NOTE — Assessment & Plan Note (Signed)
Will treat the infection with zithromax 

## 2014-01-23 NOTE — Assessment & Plan Note (Signed)
I will recheck his TSH and will adjust his dose if needed 

## 2014-01-23 NOTE — Progress Notes (Signed)
Subjective:    Patient ID: Terry Harrington, male    DOB: 01/05/1956, 59 y.o.   MRN: [id number]  Sinusitis This is a new problem. The current episode started in the past 7 days. The problem is unchanged. There has been no fever. The fever has been present for less than 1 day. His pain is at a severity of 0/10. He is experiencing no pain. Associated symptoms include chills, congestion, sinus pressure and a sore throat. Pertinent negatives include no coughing, diaphoresis, ear pain, headaches, hoarse voice, neck pain, shortness of breath, sneezing or swollen glands. Past treatments include spray decongestants. The treatment provided mild relief.      Review of Systems  Constitutional: Positive for chills. Negative for fever, diaphoresis, activity change, appetite change, fatigue and unexpected weight change.  HENT: Positive for congestion, postnasal drip, rhinorrhea, sinus pressure and sore throat. Negative for ear pain, hearing loss, hoarse voice, nosebleeds, sneezing, trouble swallowing and voice change.   Eyes: Negative.   Respiratory: Negative.  Negative for cough, choking, chest tightness, shortness of breath and stridor.   Cardiovascular: Negative.  Negative for chest pain, palpitations and leg swelling.  Gastrointestinal: Negative.  Negative for nausea, vomiting, abdominal pain, diarrhea, constipation and blood in stool.  Endocrine: Negative.  Negative for polydipsia, polyphagia and polyuria.  Genitourinary: Negative.  Negative for difficulty urinating.  Musculoskeletal: Negative.  Negative for myalgias, back pain, arthralgias and neck pain.  Skin: Negative.  Negative for rash.  Allergic/Immunologic: Negative.   Neurological: Negative.  Negative for headaches.  Hematological: Negative.  Negative for adenopathy. Does not bruise/bleed easily.  Psychiatric/Behavioral: Negative.        Objective:   Physical Exam  Constitutional: He is oriented to person, place, and time. He appears  well-developed and well-nourished.  Non-toxic appearance. He does not have a sickly appearance. He does not appear ill. No distress.  HENT:  Right Ear: Hearing, tympanic membrane, external ear and ear canal normal.  Left Ear: Hearing, tympanic membrane, external ear and ear canal normal.  Nose: Mucosal edema and rhinorrhea present. No sinus tenderness or nasal deformity.  No foreign bodies. Right sinus exhibits maxillary sinus tenderness. Right sinus exhibits no frontal sinus tenderness. Left sinus exhibits no maxillary sinus tenderness and no frontal sinus tenderness.  Mouth/Throat: Oropharynx is clear and moist and mucous membranes are normal. Mucous membranes are not pale, not dry and not cyanotic. No oral lesions. No trismus in the jaw. No uvula swelling. No oropharyngeal exudate, posterior oropharyngeal edema, posterior oropharyngeal erythema or tonsillar abscesses.  Eyes: Conjunctivae are normal. Right eye exhibits no discharge. Left eye exhibits no discharge. No scleral icterus.  Neck: Normal range of motion. Neck supple. No JVD present. No tracheal deviation present. No thyromegaly present.  Cardiovascular: Normal rate, regular rhythm, normal heart sounds and intact distal pulses.  Exam reveals no gallop and no friction rub.   No murmur heard. Pulmonary/Chest: Effort normal and breath sounds normal. No stridor. No respiratory distress. He has no wheezes. He has no rales. He exhibits no tenderness.  Abdominal: Soft. Bowel sounds are normal. He exhibits no distension and no mass. There is no tenderness. There is no rebound and no guarding.  Musculoskeletal: Normal range of motion. He exhibits no edema or tenderness.  Lymphadenopathy:    He has no cervical adenopathy.  Neurological: He is oriented to person, place, and time.  Skin: Skin is warm and dry. No rash noted. He is not diaphoretic. No erythema. No pallor.  Psychiatric: He  has a normal mood and affect. His behavior is normal. Judgment  and thought content normal.  Vitals reviewed.    Lab Results  Component Value Date   WBC 5.2 01/23/2014   HGB 13.0 01/23/2014   HCT 39.0 01/23/2014   PLT 313.0 01/23/2014   GLUCOSE 105* 01/23/2014   CHOL 132 01/23/2014   TRIG 72.0 01/23/2014   HDL 58.10 01/23/2014   LDLDIRECT 132.8 02/21/2013   LDLCALC 60 01/23/2014   ALT 11 01/23/2014   AST 16 01/23/2014   NA 140 01/23/2014   K 4.5 01/23/2014   CL 110 01/23/2014   CREATININE 1.12 01/23/2014   BUN 13 01/23/2014   CO2 23 01/23/2014   TSH 3.33 01/23/2014   PSA 0.09* 02/21/2013   HGBA1C 7.4* 01/23/2014   MICROALBUR 0.6 02/21/2013       Assessment & Plan:

## 2014-01-23 NOTE — Progress Notes (Signed)
Pre visit review using our clinic review tool, if applicable. No additional management support is needed unless otherwise documented below in the visit note. 

## 2014-01-23 NOTE — Assessment & Plan Note (Signed)
He will cont the current meds for now Will refer for an eye exam Will check his A1C and renal function, will treat if needed

## 2014-02-20 ENCOUNTER — Ambulatory Visit: Payer: BLUE CROSS/BLUE SHIELD | Admitting: Internal Medicine

## 2014-02-28 ENCOUNTER — Other Ambulatory Visit: Payer: Self-pay | Admitting: Internal Medicine

## 2014-03-06 ENCOUNTER — Ambulatory Visit: Payer: BLUE CROSS/BLUE SHIELD | Admitting: Internal Medicine

## 2014-03-13 ENCOUNTER — Encounter: Payer: Self-pay | Admitting: Internal Medicine

## 2014-03-13 ENCOUNTER — Ambulatory Visit (INDEPENDENT_AMBULATORY_CARE_PROVIDER_SITE_OTHER): Payer: BLUE CROSS/BLUE SHIELD | Admitting: Internal Medicine

## 2014-03-13 VITALS — BP 138/80 | HR 60 | Temp 98.5°F | Resp 16 | Ht 70.0 in | Wt 223.8 lb

## 2014-03-13 DIAGNOSIS — R0683 Snoring: Secondary | ICD-10-CM

## 2014-03-13 DIAGNOSIS — I10 Essential (primary) hypertension: Secondary | ICD-10-CM

## 2014-03-13 DIAGNOSIS — E118 Type 2 diabetes mellitus with unspecified complications: Secondary | ICD-10-CM

## 2014-03-13 DIAGNOSIS — E038 Other specified hypothyroidism: Secondary | ICD-10-CM

## 2014-03-13 DIAGNOSIS — Z23 Encounter for immunization: Secondary | ICD-10-CM

## 2014-03-13 NOTE — Patient Instructions (Signed)
Hypothyroidism The thyroid is a large gland located in the lower front of your neck. The thyroid gland helps control metabolism. Metabolism is how your body handles food. It controls metabolism with the hormone thyroxine. When this gland is underactive (hypothyroid), it produces too little hormone.  CAUSES These include:   Absence or destruction of thyroid tissue.  Goiter due to iodine deficiency.  Goiter due to medications.  Congenital defects (since birth).  Problems with the pituitary. This causes a lack of TSH (thyroid stimulating hormone). This hormone tells the thyroid to turn out more hormone. SYMPTOMS  Lethargy (feeling as though you have no energy)  Cold intolerance  Weight gain (in spite of normal food intake)  Dry skin  Coarse hair  Menstrual irregularity (if severe, may lead to infertility)  Slowing of thought processes Cardiac problems are also caused by insufficient amounts of thyroid hormone. Hypothyroidism in the newborn is cretinism, and is an extreme form. It is important that this form be treated adequately and immediately or it will lead rapidly to retarded physical and mental development. DIAGNOSIS  To prove hypothyroidism, your caregiver may do blood tests and ultrasound tests. Sometimes the signs are hidden. It may be necessary for your caregiver to watch this illness with blood tests either before or after diagnosis and treatment. TREATMENT  Low levels of thyroid hormone are increased by using synthetic thyroid hormone. This is a safe, effective treatment. It usually takes about four weeks to gain the full effects of the medication. After you have the full effect of the medication, it will generally take another four weeks for problems to leave. Your caregiver may start you on low doses. If you have had heart problems the dose may be gradually increased. It is generally not an emergency to get rapidly to normal. HOME CARE INSTRUCTIONS   Take your  medications as your caregiver suggests. Let your caregiver know of any medications you are taking or start taking. Your caregiver will help you with dosage schedules.  As your condition improves, your dosage needs may increase. It will be necessary to have continuing blood tests as suggested by your caregiver.  Report all suspected medication side effects to your caregiver. SEEK MEDICAL CARE IF: Seek medical care if you develop:  Sweating.  Tremulousness (tremors).  Anxiety.  Rapid weight loss.  Heat intolerance.  Emotional swings.  Diarrhea.  Weakness. SEEK IMMEDIATE MEDICAL CARE IF:  You develop chest pain, an irregular heart beat (palpitations), or a rapid heart beat. MAKE SURE YOU:   Understand these instructions.  Will watch your condition.  Will get help right away if you are not doing well or get worse. Document Released: 12/26/2004 Document Revised: 03/20/2011 Document Reviewed: 08/16/2007 [employer] Patient Information 2015 [employer]. This information is not intended to replace advice given to you by your health care provider. Make sure you discuss any questions you have with your health care provider.  

## 2014-03-13 NOTE — Progress Notes (Signed)
Pre visit review using our clinic review tool, if applicable. No additional management support is needed unless otherwise documented below in the visit note. 

## 2014-03-15 NOTE — Assessment & Plan Note (Signed)
His BP is well controlled Lytes and renal function are stable 

## 2014-03-15 NOTE — Assessment & Plan Note (Signed)
Will refer for a sleep evaluation 

## 2014-03-15 NOTE — Assessment & Plan Note (Signed)
His recent TSh was in the normal range Will cont the current dose

## 2014-03-15 NOTE — Progress Notes (Signed)
Subjective:    Patient ID: Terry Harrington, male    DOB: 09/12/1955, 59 y.o.   MRN: [id number]  Hyperlipidemia Pertinent negatives include no chest pain or shortness of breath.  Hypertension This is a chronic problem. The current episode started more than 1 year ago. The problem is unchanged. The problem is controlled. Pertinent negatives include no anxiety, blurred vision, chest pain, headaches, malaise/fatigue, neck pain, orthopnea, palpitations, peripheral edema, PND, shortness of breath or sweats. There are no associated agents to hypertension. Past treatments include angiotensin blockers and beta blockers. The current treatment provides moderate improvement. There are no compliance problems.   Diabetes Pertinent negatives for hypoglycemia include no headaches or sweats. Pertinent negatives for diabetes include no blurred vision, no chest pain and no fatigue.      Review of Systems  Constitutional: Negative.  Negative for fever, chills, malaise/fatigue, diaphoresis, appetite change and fatigue.  HENT: Negative.   Eyes: Negative.  Negative for blurred vision.  Respiratory: Positive for apnea. Negative for shortness of breath.   Cardiovascular: Negative.  Negative for chest pain, palpitations, orthopnea, leg swelling and PND.  Gastrointestinal: Negative.  Negative for nausea, vomiting, abdominal pain, diarrhea, constipation and blood in stool.  Endocrine: Negative.   Genitourinary: Negative.  Negative for dysuria, urgency, frequency, hematuria, flank pain, decreased urine volume and difficulty urinating.  Musculoskeletal: Negative.  Negative for back pain, arthralgias and neck pain.  Skin: Negative.  Negative for rash.  Allergic/Immunologic: Negative.   Neurological: Negative.  Negative for headaches.  Hematological: Negative.  Negative for adenopathy. Does not bruise/bleed easily.  Psychiatric/Behavioral: Negative.        Objective:   Physical Exam  Constitutional: He is  oriented to person, place, and time. He appears well-developed and well-nourished. No distress.  HENT:  Head: Normocephalic and atraumatic.  Mouth/Throat: Oropharynx is clear and moist. No oropharyngeal exudate.  Eyes: Conjunctivae are normal. Right eye exhibits no discharge. Left eye exhibits no discharge. No scleral icterus.  Neck: Normal range of motion. Neck supple. No JVD present. No tracheal deviation present. No thyromegaly present.  Cardiovascular: Normal rate, regular rhythm, normal heart sounds and intact distal pulses.  Exam reveals no gallop and no friction rub.   No murmur heard. Pulmonary/Chest: Effort normal and breath sounds normal. No stridor. No respiratory distress. He has no wheezes. He has no rales. He exhibits no tenderness.  Abdominal: Soft. Bowel sounds are normal. He exhibits no distension and no mass. There is no tenderness. There is no rebound and no guarding.  Musculoskeletal: Normal range of motion. He exhibits no edema or tenderness.  Lymphadenopathy:    He has no cervical adenopathy.  Neurological: He is oriented to person, place, and time.  Skin: Skin is warm and dry. No rash noted. He is not diaphoretic. No erythema. No pallor.  Nursing note and vitals reviewed.     Lab Results  Component Value Date   WBC 5.2 01/23/2014   HGB 13.0 01/23/2014   HCT 39.0 01/23/2014   PLT 313.0 01/23/2014   GLUCOSE 105* 01/23/2014   CHOL 132 01/23/2014   TRIG 72.0 01/23/2014   HDL 58.10 01/23/2014   LDLDIRECT 132.8 02/21/2013   LDLCALC 60 01/23/2014   ALT 11 01/23/2014   AST 16 01/23/2014   NA 140 01/23/2014   K 4.5 01/23/2014   CL 110 01/23/2014   CREATININE 1.12 01/23/2014   BUN 13 01/23/2014   CO2 23 01/23/2014   TSH 3.33 01/23/2014   PSA 0.09* 02/21/2013  HGBA1C 7.4* 01/23/2014   MICROALBUR 0.6 02/21/2013      Assessment & Plan:

## 2014-03-15 NOTE — Assessment & Plan Note (Signed)
His blood sugars are adequately well controlled Will cont the current regimen for blood sugar control

## 2014-04-13 ENCOUNTER — Other Ambulatory Visit: Payer: Self-pay | Admitting: Internal Medicine

## 2014-04-15 ENCOUNTER — Other Ambulatory Visit: Payer: Self-pay | Admitting: Internal Medicine

## 2014-05-22 LAB — HM COLONOSCOPY

## 2014-06-05 ENCOUNTER — Other Ambulatory Visit: Payer: Self-pay | Admitting: Geriatric Medicine

## 2014-06-05 ENCOUNTER — Other Ambulatory Visit: Payer: Self-pay

## 2014-06-05 MED ORDER — PIOGLITAZONE HCL-METFORMIN HCL 15-850 MG PO TABS: ORAL_TABLET | ORAL | Status: DC

## 2014-06-11 ENCOUNTER — Ambulatory Visit (INDEPENDENT_AMBULATORY_CARE_PROVIDER_SITE_OTHER)
Admission: RE | Admit: 2014-06-11 | Discharge: 2014-06-11 | Disposition: A | Discharge status: Home / Self Care | Payer: BLUE CROSS/BLUE SHIELD | Source: Ambulatory Visit | Attending: Internal Medicine | Admitting: Internal Medicine

## 2014-06-11 ENCOUNTER — Other Ambulatory Visit (INDEPENDENT_AMBULATORY_CARE_PROVIDER_SITE_OTHER): Payer: BLUE CROSS/BLUE SHIELD

## 2014-06-11 ENCOUNTER — Ambulatory Visit (INDEPENDENT_AMBULATORY_CARE_PROVIDER_SITE_OTHER): Payer: BLUE CROSS/BLUE SHIELD | Admitting: Internal Medicine

## 2014-06-11 ENCOUNTER — Encounter: Payer: Self-pay | Admitting: Internal Medicine

## 2014-06-11 VITALS — BP 130/82 | HR 67 | Temp 98.4°F | Resp 16 | Ht 70.0 in | Wt 226.0 lb

## 2014-06-11 DIAGNOSIS — I10 Essential (primary) hypertension: Secondary | ICD-10-CM

## 2014-06-11 DIAGNOSIS — M25522 Pain in left elbow: Secondary | ICD-10-CM

## 2014-06-11 DIAGNOSIS — E118 Type 2 diabetes mellitus with unspecified complications: Secondary | ICD-10-CM

## 2014-06-11 DIAGNOSIS — M25529 Pain in unspecified elbow: Secondary | ICD-10-CM | POA: Insufficient documentation

## 2014-06-11 DIAGNOSIS — E038 Other specified hypothyroidism: Secondary | ICD-10-CM | POA: Diagnosis not present

## 2014-06-11 DIAGNOSIS — E785 Hyperlipidemia, unspecified: Secondary | ICD-10-CM

## 2014-06-11 LAB — BASIC METABOLIC PANEL
BUN: 12 mg/dL (ref 6–23)
CALCIUM: 10 mg/dL (ref 8.4–10.5)
CO2: 27 mEq/L (ref 19–32)
Chloride: 104 mEq/L (ref 96–112)
Creatinine, Ser: 1.09 mg/dL (ref 0.40–1.50)
GFR: 88.55 mL/min (ref >60.00)
Glucose, Bld: 154 mg/dL — ABNORMAL HIGH (ref 70–99)
POTASSIUM: 4.5 meq/L (ref 3.5–5.1)
Sodium: 136 mEq/L (ref 135–145)

## 2014-06-11 LAB — TSH: TSH: 4.12 u[IU]/mL (ref 0.35–4.50)

## 2014-06-11 LAB — HEMOGLOBIN A1C: HEMOGLOBIN A1C: 7.4 % — AB (ref 4.6–6.5)

## 2014-06-11 MED ORDER — MELOXICAM 15 MG PO TABS: 15.0000 mg | ORAL_TABLET | Freq: Every day | ORAL | Status: DC

## 2014-06-11 NOTE — Progress Notes (Signed)
Pre visit review using our clinic review tool, if applicable. No additional management support is needed unless otherwise documented below in the visit note. 

## 2014-06-11 NOTE — Patient Instructions (Signed)
Elbow Exercises °EXERCISES °RANGE OF MOTION (ROM) AND STRETCHING EXERCISES  °These exercises may help you when beginning to rehabilitate your injury. Your symptoms may go away with or without further involvement from your physician, physical therapist or athletic trainer. While completing these exercises, remember:  °· Restoring tissue flexibility helps normal motion to return to the joints. This allows healthier, less painful movement and activity. °· An effective stretch should be held for at least 30 seconds. °· A stretch should never be painful. You should only feel a gentle lengthening or release in the stretched tissue. °RANGE OF MOTION - Extension °· Hold your right / left arm at your side and straighten your elbow as far as you can, using your right / left arm muscles. °· Straighten the elbow farther by gently pushing down on your forearm until you feel a gentle stretch on the inside of your elbow. Hold this position for __________ seconds. °· Slowly return to the starting position. °Repeat __________ times. Complete this exercise __________ times per day.  °RANGE OF MOTION - Flexion °· Hold your right / left arm at your side and bend your elbow as far as you can using your arm muscles. °· Bend the right / left elbow farther by gently pushing up on your forearm until you feel a gentle stretch on the outside of your elbow. Hold this position for __________ seconds. °· Slowly return to the starting position. °Repeat __________ times. Complete this exercise __________ times per day.  °RANGE OF MOTION - Supination, Active °· Stand or sit with your elbows at your side. Bend your right / left elbow to 90 degrees. °· Turn your palm upward until you feel a gentle stretch on the inside of your forearm. °· Hold this position for __________ seconds. Slowly release and return to the starting position. °Repeat __________ times. Complete this stretch __________ times per day.  °RANGE OF MOTION - Pronation, Active °· Stand  or sit with your elbows at your side. Bend your right / left elbow to 90 degrees. °· Turn your palm downward until you feel a gentle stretch on the top of your forearm. °· Hold this position for __________ seconds. Slowly release and return to the starting position. °Repeat __________ times. Complete this stretch __________ times per day.  °STRETCH - Elbow Flexors °· Lie on a firm bed or countertop, on your back. Be sure that you are in a comfortable position which will allow you to relax your arm muscles. °· Place a folded towel under your right / left upper arm, so that your elbow and shoulder are at the same height. Extend your arm; your elbow should not rest on the bed or towel °· Allow the weight of your hand to straighten your elbow. Keep your arm and chest muscles relaxed. Your caregiver may ask you to increase the intensity of your stretch by adding a small wrist or hand weight. °· Hold for __________ seconds. You should feel a stretch on the inside of your elbow. Slowly return to the starting position. °Repeat __________ times. Complete this exercise __________ times per day. °STRENGTHENING EXERCISES  °These exercises may help you when beginning to rehabilitate your injury. They may resolve your symptoms with or without further involvement from your physician, physical therapist or athletic trainer. While completing these exercises, remember:  °· Muscles can gain both the endurance and the strength needed for everyday activities through controlled exercises. °· Complete these exercises as instructed by your physician, physical therapist or athletic   trainer. Increase the resistance and repetitions only as guided. °· You may experience muscle soreness or fatigue, but the pain or discomfort you are trying to eliminate should never worsen during these exercises. If this pain does get worse, stop and make sure you are following the directions exactly. If the pain is still present after adjustments, discontinue  the exercise until you can discuss the trouble with your caregiver. °STRENGTH - Elbow Flexors, Isometric  °· Stand or sit upright on a firm surface. Place your right / left arm so that your hand is palm-up and at the height of your waist. °· Place your opposite hand on top of your forearm. Gently push down as your right / left arm resists. Push as hard as you can with both arms without causing any pain or movement at your right / left elbow. Hold this stationary position for __________ seconds. °· Gradually release the tension in both arms. Allow your muscles to relax completely before repeating. °Repeat __________ times. Complete this exercise __________ times per day. °STRENGTH - Elbow Extensors, Isometric °· Stand or sit upright on a firm surface. Place your right / left arm so that your palm faces your abdomen and it is at the height of your waist. °· Place your opposite hand on the underside of your forearm. Gently push up as your right / left arm resists. Push as hard as you can with both arms without causing any pain or movement at your right / left elbow. Hold this stationary position for __________ seconds. °· Gradually release the tension in both arms. Allow your muscles to relax completely before repeating. °Repeat __________ times. Complete this exercise __________ times per day. °STRENGTH - Elbow Flexors, Supinated °· With good posture, stand, or sit on a firm chair without armrests. Allow your right / left arm to rest at your side with your palm facing forward. °· Holding a __________ weight, or gripping a rubber exercise band or tubing, bring your hand toward your shoulder. °· Allow your muscles to control the resistance, as your hand returns to your side. °Repeat __________ times. Complete this exercise __________ times per day.  °STRENGTH - Elbow Extensors, Dynamic °· With good posture, stand, or sit on a firm chair without armrests. Keeping your upper arms at your side, bring both hands up toward  your right / left shoulder while gripping a rubber exercise band or tubing. Your right / left hand should be just below the other hand. °· Straighten your right / left elbow. Hold for __________ seconds. °· Allow your muscles to control the rubber exercise band, as your hand returns to your shoulder. °Repeat __________ times. Complete this exercise __________ times per day. °STRENGTH - Forearm Supinators  °· Sit with your right / left forearm supported on a table, keeping your elbow below shoulder height. Rest your hand over the edge, palm down. °· Gently grip a hammer or a soup ladle. °· Without moving your elbow, slowly turn your palm and hand upward to a "thumbs-up" position. °· Hold this position for __________ seconds. Slowly return to the starting position. °Repeat __________ times. Complete this exercise __________ times per day.  °STRENGTH - Forearm Pronators °· Sit with your right / left forearm supported on a table, keeping your elbow below shoulder height. Rest your hand over the edge, palm up. °· Gently grip a hammer or a soup ladle. °· Without moving your elbow, slowly turn your palm and hand upward to a "thumbs-up" position. °· Hold this   position for __________ seconds. Slowly return to the starting position. °Repeat __________ times. Complete this exercise __________ times per day. °Document Released: 11/09/2004 Document Revised: 03/20/2011 Document Reviewed: 04/09/2008 °[employer]® Patient Information ©2015 [employer]. This information is not intended to replace advice given to you by your health care provider. Make sure you discuss any questions you have with your health care provider. ° °

## 2014-06-11 NOTE — Progress Notes (Signed)
Subjective:  Patient ID: Terry Harrington, male    DOB: Feb 25, 1955  Age: 59 y.o. MRN: [id number]  CC: Hypertension; Hypothyroidism; Hyperlipidemia; Diabetes; and Elbow Pain   HPI Terry Harrington presents for routine follow up but he complains of left elbow pain for 2 months with no history of trauma or injury. There is an aching sensation with movement. He has taken advil with minimal relief from his discomfort. He denies any swelling or warmth and no other joints are painful.  Outpatient Prescriptions Prior to Visit  Medication Sig Dispense Refill  . cholecalciferol (VITAMIN D) 1000 UNITS tablet Take 2,000 Units by mouth daily.    . Choline Fenofibrate (FENOFIBRIC ACID) 135 MG CPDR TAKE 1 CAPSULE EVERY DAY 90 capsule 1  . Insulin Glargine (LANTUS) 100 UNIT/ML Solostar Pen Inject 20 Units into the skin daily at 10 pm. 15 mL 3  . Insulin Pen Needle (BD PEN NEEDLE NANO U/F) 32G X 4 MM MISC Use with injection, dx 250.00 100 each 1  . levothyroxine (SYNTHROID, LEVOTHROID) 100 MCG tablet TAKE 1 TABLET EVERY DAY 90 tablet 1  . Multiple Vitamin (MULITIVITAMIN WITH MINERALS) TABS Take 1 tablet by mouth daily.    . nebivolol (BYSTOLIC) 5 MG tablet Take 1 tablet (5 mg total) by mouth daily. 90 tablet 1  . pioglitazone-metformin (ACTOPLUS MET) 15-850 MG per tablet TAKE 2 TABLETS EVERY DAY WITH SUPPER 180 tablet 1  . pravastatin (PRAVACHOL) 40 MG tablet Take 1 tablet (40 mg total) by mouth daily. 90 tablet 3  . Tavaborole (KERYDIN) 5 % SOLN Apply 1 drop each affected toenail once daily for 12 months 10 mL 10  . TEKTURNA 150 MG tablet TAKE 1 TABLET EVERY DAY 90 tablet 1  . vitamin B-12 (CYANOCOBALAMIN) 1000 MCG tablet Take 2,000 mcg by mouth daily.     No facility-administered medications prior to visit.    ROS Review of Systems  Constitutional: Negative.  Negative for fever, chills, diaphoresis, appetite change and fatigue.  HENT: Negative.  Negative for trouble swallowing and voice change.     Eyes: Negative.   Respiratory: Negative.  Negative for cough, choking, chest tightness, shortness of breath and stridor.   Cardiovascular: Negative.  Negative for chest pain, palpitations and leg swelling.  Gastrointestinal: Negative.  Negative for nausea, vomiting, abdominal pain, diarrhea, constipation and blood in stool.  Endocrine: Negative.  Negative for polydipsia, polyphagia and polyuria.  Genitourinary: Negative.  Negative for urgency, hematuria, flank pain, decreased urine volume and difficulty urinating.  Musculoskeletal: Positive for arthralgias (left elbow only). Negative for myalgias, back pain, joint swelling, gait problem, neck pain and neck stiffness.  Skin: Negative.   Allergic/Immunologic: Negative.   Neurological: Negative.   Hematological: Negative.  Negative for adenopathy. Does not bruise/bleed easily.  Psychiatric/Behavioral: Negative.     Objective:  BP 130/82 mmHg  Pulse 67  Temp(Src) 98.4 F (36.9 C) (Oral)  Resp 16  Ht $R'5\' 10"'yx$  (1.778 m)  Wt 226 lb (102.513 kg)  BMI 32.43 kg/m2  SpO2 93%  BP Readings from Last 3 Encounters:  06/11/14 130/82  03/13/14 138/80  01/23/14 122/74    Wt Readings from Last 3 Encounters:  06/11/14 226 lb (102.513 kg)  03/13/14 223 lb 12 oz (101.492 kg)  01/23/14 220 lb (99.791 kg)    Physical Exam  Constitutional: He is oriented to person, place, and time. He appears well-developed and well-nourished. No distress.  HENT:  Head: Normocephalic and atraumatic.  Mouth/Throat: Oropharynx is clear and  moist. No oropharyngeal exudate.  Eyes: Conjunctivae are normal. Right eye exhibits no discharge. Left eye exhibits no discharge. No scleral icterus.  Neck: Normal range of motion. Neck supple. No JVD present. No tracheal deviation present. No thyromegaly present.  Cardiovascular: Normal rate, regular rhythm, normal heart sounds and intact distal pulses.  Exam reveals no gallop and no friction rub.   No murmur  heard. Pulmonary/Chest: Effort normal and breath sounds normal. No stridor. No respiratory distress. He has no wheezes. He has no rales. He exhibits no tenderness.  Abdominal: Soft. Bowel sounds are normal. He exhibits no distension and no mass. There is no tenderness. There is no rebound and no guarding.  Musculoskeletal: Normal range of motion. He exhibits no edema or tenderness.       Left elbow: Normal. He exhibits normal range of motion, no swelling, no effusion, no deformity and no laceration. No tenderness found.  Lymphadenopathy:    He has no cervical adenopathy.  Neurological: He is oriented to person, place, and time.  Skin: Skin is warm and dry. No rash noted. He is not diaphoretic. No erythema. No pallor.  Psychiatric: He has a normal mood and affect. His behavior is normal. Judgment and thought content normal.  Vitals reviewed.   Lab Results  Component Value Date   WBC 5.2 01/23/2014   HGB 13.0 01/23/2014   HCT 39.0 01/23/2014   PLT 313.0 01/23/2014   GLUCOSE 154* 06/11/2014   CHOL 132 01/23/2014   TRIG 72.0 01/23/2014   HDL 58.10 01/23/2014   LDLDIRECT 132.8 02/21/2013   LDLCALC 60 01/23/2014   ALT 11 01/23/2014   AST 16 01/23/2014   NA 136 06/11/2014   K 4.5 06/11/2014   CL 104 06/11/2014   CREATININE 1.09 06/11/2014   BUN 12 06/11/2014   CO2 27 06/11/2014   TSH 4.12 06/11/2014   PSA 0.09* 02/21/2013   HGBA1C 7.4* 06/11/2014   MICROALBUR 0.6 02/21/2013    Dg Chest 2 View  06/20/2013   CLINICAL DATA:  Cough for 3 weeks, and diabetes, hypertension  EXAM: CHEST  2 VIEW  COMPARISON:  Chest x-ray of 05/26/2011  FINDINGS: No active infiltrate or effusion is seen. There is some peribronchial thickening present which may indicate bronchitis. The heart is borderline enlarged. There is a moderate thoracolumbar scoliosis present.  IMPRESSION: 1. No pneumonia.  Peribronchial thickening may indicate bronchitis. 2. Moderate thoracolumbar scoliosis.   Electronically Signed    By: Terry Harrington M.D.   On: 06/20/2013 10:17    Assessment & Plan:   Terry Harrington was seen today for hypertension, hypothyroidism, hyperlipidemia, diabetes and elbow pain.  Diagnoses and all orders for this visit:  Type II diabetes mellitus with manifestations - his blood sugars are well controlled Orders: -     Basic metabolic panel; Future -     Hemoglobin A1c; Future  Other specified hypothyroidism - his TSh is on the normal range, will cont the current dose Orders: -     TSH; Future  Essential hypertension - his BP is well controlled, lytes and renal function are stable Orders: -     Basic metabolic panel; Future  Hyperlipidemia with target LDL less than 100  Elbow pain, left - his exam and xray are WNL, will start an nsaids and see how he responds, if this does not improve then I will consider getting an MRI of the elbow and/or ortho referral Orders: -     DG Elbow Complete Left; Future -  meloxicam (MOBIC) 15 MG tablet; Take 1 tablet (15 mg total) by mouth daily.   I am having Mr. Speir start on meloxicam. I am also having him maintain his multivitamin with minerals, vitamin B-12, cholecalciferol, Tavaborole, Insulin Pen Needle, nebivolol, Insulin Glargine, pravastatin, levothyroxine, Fenofibric Acid, TEKTURNA, and pioglitazone-metformin.  Meds ordered this encounter  Medications  . meloxicam (MOBIC) 15 MG tablet    Sig: Take 1 tablet (15 mg total) by mouth daily.    Dispense:  30 tablet    Refill:  1     Follow-up: Return in about 3 weeks (around 07/02/2014).  Scarlette Calico, MD

## 2014-06-13 ENCOUNTER — Encounter: Payer: Self-pay | Admitting: Internal Medicine

## 2014-06-15 ENCOUNTER — Encounter: Payer: Self-pay | Admitting: Internal Medicine

## 2014-07-27 ENCOUNTER — Other Ambulatory Visit (INDEPENDENT_AMBULATORY_CARE_PROVIDER_SITE_OTHER): Payer: BLUE CROSS/BLUE SHIELD

## 2014-07-27 ENCOUNTER — Ambulatory Visit (INDEPENDENT_AMBULATORY_CARE_PROVIDER_SITE_OTHER): Payer: BLUE CROSS/BLUE SHIELD | Admitting: Internal Medicine

## 2014-07-27 ENCOUNTER — Encounter: Payer: Self-pay | Admitting: Internal Medicine

## 2014-07-27 VITALS — BP 126/64 | HR 64 | Temp 98.7°F | Resp 16 | Ht 70.0 in | Wt 225.0 lb

## 2014-07-27 DIAGNOSIS — I10 Essential (primary) hypertension: Secondary | ICD-10-CM | POA: Diagnosis not present

## 2014-07-27 DIAGNOSIS — R0609 Other forms of dyspnea: Secondary | ICD-10-CM

## 2014-07-27 DIAGNOSIS — R0600 Dyspnea, unspecified: Secondary | ICD-10-CM

## 2014-07-27 DIAGNOSIS — E118 Type 2 diabetes mellitus with unspecified complications: Secondary | ICD-10-CM

## 2014-07-27 DIAGNOSIS — R9431 Abnormal electrocardiogram [ECG] [EKG]: Secondary | ICD-10-CM

## 2014-07-27 DIAGNOSIS — E038 Other specified hypothyroidism: Secondary | ICD-10-CM | POA: Diagnosis not present

## 2014-07-27 LAB — CBC WITH DIFFERENTIAL/PLATELET
BASOS ABS: 0 10*3/uL (ref 0.0–0.1)
Basophils Relative: 0.6 % (ref 0.0–3.0)
Eosinophils Absolute: 0.1 10*3/uL (ref 0.0–0.7)
Eosinophils Relative: 1.7 % (ref 0.0–5.0)
HEMATOCRIT: 37 % — AB (ref 39.0–52.0)
Hemoglobin: 12.4 g/dL — ABNORMAL LOW (ref 13.0–17.0)
LYMPHS ABS: 2.5 10*3/uL (ref 0.7–4.0)
Lymphocytes Relative: 46.2 % — ABNORMAL HIGH (ref 12.0–46.0)
MCHC: 33.6 g/dL (ref 30.0–36.0)
MCV: 88.4 fl (ref 78.0–100.0)
MONO ABS: 0.3 10*3/uL (ref 0.1–1.0)
Monocytes Relative: 5.7 % (ref 3.0–12.0)
NEUTROS ABS: 2.5 10*3/uL (ref 1.4–7.7)
NEUTROS PCT: 45.8 % (ref 43.0–77.0)
Platelets: 313 10*3/uL (ref 150.0–400.0)
RBC: 4.18 Mil/uL — ABNORMAL LOW (ref 4.22–5.81)
RDW: 14.3 % (ref 11.5–15.5)
WBC: 5.5 10*3/uL (ref 4.0–10.5)

## 2014-07-27 LAB — CARDIAC PANEL
CK-MB: 1.4 ng/mL (ref 0.3–4.0)
Relative Index: 1 calc (ref 0.0–2.5)
Total CK: 144 U/L (ref 7–232)

## 2014-07-27 LAB — TROPONIN I: TNIDX: 0 ug/L (ref 0.00–0.06)

## 2014-07-27 MED ORDER — ASPIRIN 81 MG PO TABS
81.0000 mg | ORAL_TABLET | Freq: Every day | ORAL | Status: DC
Start: 2014-07-27 — End: 2014-08-17

## 2014-07-27 NOTE — Progress Notes (Signed)
Pre visit review using our clinic review tool, if applicable. No additional management support is needed unless otherwise documented below in the visit note. 

## 2014-07-27 NOTE — Patient Instructions (Signed)

## 2014-07-27 NOTE — Progress Notes (Signed)
Subjective:  Patient ID: Terry Harrington, male    DOB: November 12, 1955  Age: 59 y.o. MRN: [id number]  CC: Hypothyroidism; Diabetes; and Hypertension   HPI Terry Harrington presents for the complaint of mild dyspnea on exertion for 3 months. He also complains for the last month he has had some orthostatic lightheadedness when getting out of his car.  Outpatient Prescriptions Prior to Visit  Medication Sig Dispense Refill  . cholecalciferol (VITAMIN D) 1000 UNITS tablet Take 2,000 Units by mouth daily.    . Choline Fenofibrate (FENOFIBRIC ACID) 135 MG CPDR TAKE 1 CAPSULE EVERY DAY 90 capsule 1  . Insulin Glargine (LANTUS) 100 UNIT/ML Solostar Pen Inject 20 Units into the skin daily at 10 pm. 15 mL 3  . Insulin Pen Needle (BD PEN NEEDLE NANO U/F) 32G X 4 MM MISC Use with injection, dx 250.00 100 each 1  . levothyroxine (SYNTHROID, LEVOTHROID) 100 MCG tablet TAKE 1 TABLET EVERY DAY 90 tablet 1  . meloxicam (MOBIC) 15 MG tablet Take 1 tablet (15 mg total) by mouth daily. 30 tablet 1  . Multiple Vitamin (MULITIVITAMIN WITH MINERALS) TABS Take 1 tablet by mouth daily.    . nebivolol (BYSTOLIC) 5 MG tablet Take 1 tablet (5 mg total) by mouth daily. 90 tablet 1  . pioglitazone-metformin (ACTOPLUS MET) 15-850 MG per tablet TAKE 2 TABLETS EVERY DAY WITH SUPPER 180 tablet 1  . pravastatin (PRAVACHOL) 40 MG tablet Take 1 tablet (40 mg total) by mouth daily. 90 tablet 3  . Tavaborole (KERYDIN) 5 % SOLN Apply 1 drop each affected toenail once daily for 12 months 10 mL 10  . TEKTURNA 150 MG tablet TAKE 1 TABLET EVERY DAY 90 tablet 1  . vitamin B-12 (CYANOCOBALAMIN) 1000 MCG tablet Take 2,000 mcg by mouth daily.     No facility-administered medications prior to visit.    ROS Review of Systems  Constitutional: Negative.  Negative for fever, chills, diaphoresis, activity change, appetite change, fatigue and unexpected weight change.  HENT: Negative.   Eyes: Negative.   Respiratory: Positive for  shortness of breath. Negative for cough, choking, chest tightness, wheezing and stridor.   Cardiovascular: Negative.  Negative for chest pain, palpitations and leg swelling.  Gastrointestinal: Negative.  Negative for abdominal pain.  Endocrine: Negative.  Negative for cold intolerance, heat intolerance, polydipsia, polyphagia and polyuria.  Genitourinary: Negative.   Musculoskeletal: Negative.   Skin: Negative.   Allergic/Immunologic: Negative.   Neurological: Positive for light-headedness. Negative for dizziness, tremors, seizures, facial asymmetry, weakness, numbness and headaches.  Hematological: Negative.  Negative for adenopathy. Does not bruise/bleed easily.  Psychiatric/Behavioral: Negative.     Objective:  BP 126/64 mmHg  Pulse 64  Temp(Src) 98.7 F (37.1 C) (Oral)  Resp 16  Ht 5' 10" (1.778 m)  Wt 225 lb (102.059 kg)  BMI 32.28 kg/m2  SpO2 97%  BP Readings from Last 3 Encounters:  07/27/14 126/64  06/11/14 130/82  03/13/14 138/80    Wt Readings from Last 3 Encounters:  07/27/14 225 lb (102.059 kg)  06/11/14 226 lb (102.513 kg)  03/13/14 223 lb 12 oz (101.492 kg)    Physical Exam  Constitutional: He is oriented to person, place, and time.  Non-toxic appearance. He does not have a sickly appearance. He does not appear ill. No distress.  BP laying down 144/80 pulse 60 BP standing 142/84 pulse 60  HENT:  Mouth/Throat: Oropharynx is clear and moist. No oropharyngeal exudate.  Eyes: Conjunctivae are normal. Right eye exhibits  no discharge. Left eye exhibits no discharge. No scleral icterus.  Neck: Normal range of motion. Neck supple. No JVD present. No tracheal deviation present. No thyromegaly present.  Cardiovascular: Normal rate, regular rhythm, normal heart sounds and intact distal pulses.  Exam reveals no gallop and no friction rub.   No murmur heard. EKG today  Sinus  Bradycardia  -  Negative precordial T-waves  -May be normal -consider anteroseptal ischemia.    BORDERLINE- these changes are new compared to his prior EKG of one year ago  Pulmonary/Chest: Effort normal and breath sounds normal. No stridor. No respiratory distress. He has no wheezes. He has no rales. He exhibits no tenderness.  Abdominal: Soft. Bowel sounds are normal. He exhibits no distension and no mass. There is no tenderness. There is no rebound and no guarding.  Musculoskeletal: Normal range of motion. He exhibits no edema or tenderness.  Lymphadenopathy:    He has no cervical adenopathy.  Neurological: He is oriented to person, place, and time.  Skin: Skin is warm and dry. No rash noted. He is not diaphoretic. No erythema. No pallor.    Lab Results  Component Value Date   WBC 5.5 07/27/2014   HGB 12.4* 07/27/2014   HCT 37.0* 07/27/2014   PLT 313.0 07/27/2014   GLUCOSE 154* 06/11/2014   CHOL 132 01/23/2014   TRIG 72.0 01/23/2014   HDL 58.10 01/23/2014   LDLDIRECT 132.8 02/21/2013   LDLCALC 60 01/23/2014   ALT 11 01/23/2014   AST 16 01/23/2014   NA 136 06/11/2014   K 4.5 06/11/2014   CL 104 06/11/2014   CREATININE 1.09 06/11/2014   BUN 12 06/11/2014   CO2 27 06/11/2014   TSH 4.12 06/11/2014   PSA 0.09* 02/21/2013   HGBA1C 7.4* 06/11/2014   MICROALBUR 0.6 02/21/2013    Dg Elbow Complete Left  06/12/2014   CLINICAL DATA:  59 year old male with burning elbow joint pain for 2 months with no known injury. Initial encounter.  EXAM: LEFT ELBOW - COMPLETE 3+ VIEW  COMPARISON:  Left shoulder series [phone]  FINDINGS: The anterior fat pad is prominent suggesting a joint effusion, however, no posterior fat pad sign is evident. Bone mineralization is within normal limits. Joint spaces and alignment are within normal limits for age. The distal humerus, visible radius and ulna appear intact. No periarticular erosions or acute osseous abnormality identified.  IMPRESSION: Suggestion of joint effusion but no acute osseous abnormality identified about the left elbow.   Electronically  Signed   By: Genevie Ann M.D.   On: 06/12/2014 07:53    Assessment & Plan:   Terry Harrington was seen today for hypothyroidism, diabetes and hypertension.  Diagnoses and all orders for this visit:  Other specified hypothyroidism- recent tsh was normal  Essential hypertension- his BP is well controlled  Type II diabetes mellitus with manifestations- blood sugars are well controlled  Dyspnea on exertion- he is mildly anemic but I don't think that is causing his shortness of breath. His other recent labs including tsh and A1c were normal. He does not have any signs of fluid overload. His EKG shows new inverted T waves in V1 and V2. His cardiac enzymes are normal today. This appears to be a stable situation but I'm concerned that he may be developing coronary artery disease and have therefore ordered a Lexiscan thallium scan. Will stat asa qd, cont the statin. Orders: -     EKG 12-Lead -     Myocardial Perfusion Imaging; Future -  CBC with Differential/Platelet; Future -     Troponin I; Future -     Cardiac panel; Future -     aspirin 81 MG tablet; Take 1 tablet (81 mg total) by mouth daily.  Nonspecific abnormal electrocardiogram (ECG) (EKG)- will get a Lexiscan done to screen for CAD Orders: -     Myocardial Perfusion Imaging; Future -     Troponin I; Future -     Cardiac panel; Future   I am having Terry Harrington start on aspirin. I am also having him maintain his multivitamin with minerals, vitamin B-12, cholecalciferol, Tavaborole, Insulin Pen Needle, nebivolol, Insulin Glargine, pravastatin, levothyroxine, Fenofibric Acid, TEKTURNA, pioglitazone-metformin, and meloxicam.  Meds ordered this encounter  Medications  . aspirin 81 MG tablet    Sig: Take 1 tablet (81 mg total) by mouth daily.    Dispense:  30 tablet    Refill:  11     Follow-up: Return in about 3 weeks (around 08/17/2014).  Scarlette Calico, MD

## 2014-07-28 ENCOUNTER — Encounter: Payer: Self-pay | Admitting: Internal Medicine

## 2014-08-03 ENCOUNTER — Telehealth (HOSPITAL_COMMUNITY): Payer: Self-pay

## 2014-08-03 NOTE — Telephone Encounter (Signed)
Left message on voicemail per DPR in reference to upcoming appointment scheduled on 08-05-2014 at 0730 with detailed instructions given per Myocardial Perfusion Study Information Sheet for the test. LM to arrive 15 minutes early, and that it is imperative to arrive on time for appointment to keep from having the test rescheduled.Phone number given for call back for any questions. Randa Evens, Glenis Musolf A

## 2014-08-05 ENCOUNTER — Ambulatory Visit (HOSPITAL_COMMUNITY): Payer: BLUE CROSS/BLUE SHIELD | Attending: Cardiology

## 2014-08-05 DIAGNOSIS — R0609 Other forms of dyspnea: Secondary | ICD-10-CM | POA: Diagnosis present

## 2014-08-05 DIAGNOSIS — E119 Type 2 diabetes mellitus without complications: Secondary | ICD-10-CM | POA: Insufficient documentation

## 2014-08-05 DIAGNOSIS — R9431 Abnormal electrocardiogram [ECG] [EKG]: Secondary | ICD-10-CM

## 2014-08-05 DIAGNOSIS — I10 Essential (primary) hypertension: Secondary | ICD-10-CM | POA: Diagnosis not present

## 2014-08-05 DIAGNOSIS — R002 Palpitations: Secondary | ICD-10-CM | POA: Diagnosis not present

## 2014-08-05 DIAGNOSIS — R0600 Dyspnea, unspecified: Secondary | ICD-10-CM

## 2014-08-05 LAB — MYOCARDIAL PERFUSION IMAGING
CHL CUP NUCLEAR SRS: 2
CHL CUP NUCLEAR SSS: 3
CHL CUP RESTING HR STRESS: 49 {beats}/min
LHR: 0.3
LV sys vol: 30 mL
LVDIAVOL: 85 mL
Peak HR: 81 {beats}/min
SDS: 1
TID: 1.01

## 2014-08-05 MED ORDER — REGADENOSON 0.4 MG/5ML IV SOLN
0.4000 mg | Freq: Once | INTRAVENOUS | Status: AC
  Administered 2014-08-05: 0.4 mg via INTRAVENOUS

## 2014-08-05 MED ORDER — TECHNETIUM TC 99M SESTAMIBI GENERIC - CARDIOLITE
10.1000 | Freq: Once | INTRAVENOUS | Status: AC | PRN
  Administered 2014-08-05: 10.1 via INTRAVENOUS

## 2014-08-05 MED ORDER — TECHNETIUM TC 99M SESTAMIBI GENERIC - CARDIOLITE
30.7000 | Freq: Once | INTRAVENOUS | Status: AC | PRN
  Administered 2014-08-05: 30.7 via INTRAVENOUS

## 2014-08-05 NOTE — Addendum Note (Signed)
Addended by: Etta Grandchild on: 08/05/2014 04:47 PM   Modules accepted: Kipp Brood

## 2014-08-06 ENCOUNTER — Telehealth: Payer: Self-pay | Admitting: Internal Medicine

## 2014-08-06 NOTE — Telephone Encounter (Signed)
Patient called returning Terry Harrington vm. advisedof stress test results notes.

## 2014-08-10 ENCOUNTER — Other Ambulatory Visit: Payer: Self-pay | Admitting: Internal Medicine

## 2014-08-10 DIAGNOSIS — E118 Type 2 diabetes mellitus with unspecified complications: Secondary | ICD-10-CM

## 2014-08-10 MED ORDER — INSULIN GLARGINE 100 UNITS/ML SOLOSTAR PEN
20.0000 [IU] | PEN_INJECTOR | Freq: Every day | SUBCUTANEOUS | Status: DC
Start: 2014-08-10 — End: 2015-02-18

## 2014-08-17 ENCOUNTER — Other Ambulatory Visit (INDEPENDENT_AMBULATORY_CARE_PROVIDER_SITE_OTHER): Payer: BLUE CROSS/BLUE SHIELD

## 2014-08-17 ENCOUNTER — Ambulatory Visit (INDEPENDENT_AMBULATORY_CARE_PROVIDER_SITE_OTHER): Payer: BLUE CROSS/BLUE SHIELD | Admitting: Internal Medicine

## 2014-08-17 ENCOUNTER — Encounter: Payer: Self-pay | Admitting: Internal Medicine

## 2014-08-17 VITALS — BP 144/80 | HR 64 | Temp 98.1°F | Resp 16 | Ht 70.0 in

## 2014-08-17 DIAGNOSIS — D510 Vitamin B12 deficiency anemia due to intrinsic factor deficiency: Secondary | ICD-10-CM

## 2014-08-17 DIAGNOSIS — R42 Dizziness and giddiness: Secondary | ICD-10-CM

## 2014-08-17 DIAGNOSIS — I10 Essential (primary) hypertension: Secondary | ICD-10-CM

## 2014-08-17 LAB — CBC WITH DIFFERENTIAL/PLATELET
BASOS PCT: 0.1 % (ref 0.0–3.0)
Basophils Absolute: 0 10*3/uL (ref 0.0–0.1)
EOS PCT: 1.1 % (ref 0.0–5.0)
Eosinophils Absolute: 0.1 10*3/uL (ref 0.0–0.7)
HEMATOCRIT: 37.5 % — AB (ref 39.0–52.0)
Hemoglobin: 12.7 g/dL — ABNORMAL LOW (ref 13.0–17.0)
LYMPHS ABS: 2.4 10*3/uL (ref 0.7–4.0)
LYMPHS PCT: 39.8 % (ref 12.0–46.0)
MCHC: 33.8 g/dL (ref 30.0–36.0)
MCV: 88.4 fl (ref 78.0–100.0)
Monocytes Absolute: 0.4 10*3/uL (ref 0.1–1.0)
Monocytes Relative: 6 % (ref 3.0–12.0)
Neutro Abs: 3.3 10*3/uL (ref 1.4–7.7)
Neutrophils Relative %: 53 % (ref 43.0–77.0)
Platelets: 349 10*3/uL (ref 150.0–400.0)
RBC: 4.24 Mil/uL (ref 4.22–5.81)
RDW: 14.4 % (ref 11.5–15.5)
WBC: 6.2 10*3/uL (ref 4.0–10.5)

## 2014-08-17 LAB — FOLATE: Folate: 9.4 ng/mL (ref >5.9)

## 2014-08-17 LAB — IBC PANEL
IRON: 62 ug/dL (ref 42–165)
Saturation Ratios: 13.2 % — ABNORMAL LOW (ref 20.0–50.0)
Transferrin: 336 mg/dL (ref 212.0–360.0)

## 2014-08-17 LAB — VITAMIN B12: Vitamin B-12: 346 pg/mL (ref 211–911)

## 2014-08-17 LAB — FERRITIN: Ferritin: 170 ng/mL (ref 22.0–322.0)

## 2014-08-17 MED ORDER — ALISKIREN FUMARATE 150 MG PO TABS: 150.0000 mg | ORAL_TABLET | Freq: Every day | ORAL | Status: DC

## 2014-08-17 NOTE — Progress Notes (Signed)
Pre visit review using our clinic review tool, if applicable. No additional management support is needed unless otherwise documented below in the visit note. 

## 2014-08-17 NOTE — Progress Notes (Signed)
Subjective:  Patient ID: Terry Harrington, male    DOB: Aug 10, 1955  Age: 59 y.o. MRN: [id number]  CC: Anemia and Hypertension   HPI Terry Harrington presents for follow-up. He complains of persistent orthostatic changes when he is getting in and out of his car. He describes it as a dizziness, lightheadedness, weakness, near syncope. His blood pressure is not low or high during these episodes. He does not check his pulse. He also reports for a recheck on anemia. He has chronic, unchanged shortness of breath but no dyspnea on exertion or chest pain.  Outpatient Prescriptions Prior to Visit  Medication Sig Dispense Refill  . cholecalciferol (VITAMIN D) 1000 UNITS tablet Take 2,000 Units by mouth daily.    . Choline Fenofibrate (FENOFIBRIC ACID) 135 MG CPDR TAKE 1 CAPSULE EVERY DAY 90 capsule 1  . insulin glargine (LANTUS) 100 unit/mL SOPN Inject 0.2 mLs (20 Units total) into the skin at bedtime. 15 mL 11  . Insulin Pen Needle (BD PEN NEEDLE NANO U/F) 32G X 4 MM MISC Use with injection, dx 250.00 100 each 1  . levothyroxine (SYNTHROID, LEVOTHROID) 100 MCG tablet TAKE 1 TABLET EVERY DAY 90 tablet 1  . meloxicam (MOBIC) 15 MG tablet Take 1 tablet (15 mg total) by mouth daily. 30 tablet 1  . Multiple Vitamin (MULITIVITAMIN WITH MINERALS) TABS Take 1 tablet by mouth daily.    . pioglitazone-metformin (ACTOPLUS MET) 15-850 MG per tablet TAKE 2 TABLETS EVERY DAY WITH SUPPER 180 tablet 1  . pravastatin (PRAVACHOL) 40 MG tablet Take 1 tablet (40 mg total) by mouth daily. 90 tablet 3  . Tavaborole (KERYDIN) 5 % SOLN Apply 1 drop each affected toenail once daily for 12 months 10 mL 10  . vitamin B-12 (CYANOCOBALAMIN) 1000 MCG tablet Take 2,000 mcg by mouth daily.    Marland Kitchen aspirin 81 MG tablet Take 1 tablet (81 mg total) by mouth daily. 30 tablet 11  . nebivolol (BYSTOLIC) 5 MG tablet Take 1 tablet (5 mg total) by mouth daily. 90 tablet 1  . TEKTURNA 150 MG tablet TAKE 1 TABLET EVERY DAY 90 tablet 1   No  facility-administered medications prior to visit.    ROS Review of Systems  Constitutional: Negative for fever, chills, diaphoresis, appetite change and fatigue.  HENT: Negative.   Eyes: Negative.   Respiratory: Negative.  Negative for cough, choking, chest tightness, shortness of breath, wheezing and stridor.   Cardiovascular: Negative.  Negative for chest pain and leg swelling.  Gastrointestinal: Negative.  Negative for nausea, vomiting, abdominal pain, diarrhea, constipation and blood in stool.  Endocrine: Negative.   Genitourinary: Negative.   Musculoskeletal: Negative.  Negative for myalgias, back pain, joint swelling and arthralgias.  Skin: Negative.  Negative for rash.  Allergic/Immunologic: Negative.   Neurological: Positive for dizziness, weakness and light-headedness. Negative for tremors, seizures, syncope, facial asymmetry, speech difficulty, numbness and headaches.  Hematological: Negative.   Psychiatric/Behavioral: Negative.     Objective:  BP 144/80 mmHg  Pulse 64  Temp(Src) 98.1 F (36.7 C) (Oral)  Resp 16  Ht 5\' 10"  (1.778 m)  SpO2 97%  BP Readings from Last 3 Encounters:  08/17/14 144/80  07/27/14 126/64  06/11/14 130/82    Wt Readings from Last 3 Encounters:  08/05/14 225 lb (102.059 kg)  07/27/14 225 lb (102.059 kg)  06/11/14 226 lb (102.513 kg)    Physical Exam  Constitutional: He is oriented to person, place, and time. He appears well-developed and well-nourished. No distress.  HENT:  Mouth/Throat: Oropharynx is clear and moist. No oropharyngeal exudate.  Eyes: Conjunctivae are normal. Right eye exhibits no discharge. Left eye exhibits no discharge. No scleral icterus.  Neck: Normal range of motion. Neck supple. No JVD present. No tracheal deviation present. No thyromegaly present.  Cardiovascular: Normal rate, regular rhythm, normal heart sounds and intact distal pulses.  Exam reveals no gallop and no friction rub.   No murmur  heard. Pulmonary/Chest: Effort normal and breath sounds normal. No stridor. No respiratory distress. He has no wheezes. He has no rales. He exhibits no tenderness.  Abdominal: Soft. Bowel sounds are normal. He exhibits no distension and no mass. There is no tenderness. There is no rebound and no guarding.  Musculoskeletal: Normal range of motion. He exhibits no edema or tenderness.  Lymphadenopathy:    He has no cervical adenopathy.  Neurological: He is alert and oriented to person, place, and time. He has normal reflexes. He displays normal reflexes. No cranial nerve deficit. He exhibits normal muscle tone. Coordination normal.  Skin: Skin is warm and dry. No rash noted. He is not diaphoretic. No erythema. No pallor.  Psychiatric: He has a normal mood and affect. His behavior is normal. Judgment and thought content normal.    Lab Results  Component Value Date   WBC 6.2 08/17/2014   HGB 12.7* 08/17/2014   HCT 37.5* 08/17/2014   PLT 349.0 08/17/2014   GLUCOSE 154* 06/11/2014   CHOL 132 01/23/2014   TRIG 72.0 01/23/2014   HDL 58.10 01/23/2014   LDLDIRECT 132.8 02/21/2013   LDLCALC 60 01/23/2014   ALT 11 01/23/2014   AST 16 01/23/2014   NA 136 06/11/2014   K 4.5 06/11/2014   CL 104 06/11/2014   CREATININE 1.09 06/11/2014   BUN 12 06/11/2014   CO2 27 06/11/2014   TSH 4.12 06/11/2014   PSA 0.09* 02/21/2013   HGBA1C 7.4* 06/11/2014   MICROALBUR 0.6 02/21/2013    Dg Elbow Complete Left  06/12/2014   CLINICAL DATA:  59 year old male with burning elbow joint pain for 2 months with no known injury. Initial encounter.  EXAM: LEFT ELBOW - COMPLETE 3+ VIEW  COMPARISON:  Left shoulder series [id number]  FINDINGS: The anterior fat pad is prominent suggesting a joint effusion, however, no posterior fat pad sign is evident. Bone mineralization is within normal limits. Joint spaces and alignment are within normal limits for age. The distal humerus, visible radius and ulna appear intact. No  periarticular erosions or acute osseous abnormality identified.  IMPRESSION: Suggestion of joint effusion but no acute osseous abnormality identified about the left elbow.   Electronically Signed   By: Genevie Ann M.D.   On: 06/12/2014 07:53    Assessment & Plan:   Jeremiyah was seen today for anemia and hypertension.  Diagnoses and all orders for this visit:  Dizziness and giddiness- I am concerned that the beta blocker may be causing a bradycardia which may contribute to his symptoms. I therefore asked him to stop taking bystolic. I've also asked him to see neurology to consider neurologic causes for the dizziness. -     Ambulatory referral to Neurology  Essential hypertension- he is having side effects that may be caused by bystolic so I have asked him to discontinue taking it. His blood pressure is adequately well controlled, will continue Tekturna. -     aliskiren (TEKTURNA) 150 MG tablet; Take 1 tablet (150 mg total) by mouth daily.  Pernicious anemia- his H&H has improved some.  His vitamin levels arel normal. During his next visit I will check him for hemoglobinopathy and low testosterone. He does not report any signs of blood loss. -     CBC with Differential/Platelet; Future -     IBC panel; Future -     Folate; Future -     Ferritin; Future -     Vitamin B12; Future   I have discontinued Mr. Frix nebivolol and aspirin. I have also changed his TEKTURNA to aliskiren. Additionally, I am having him maintain his multivitamin with minerals, vitamin B-12, cholecalciferol, Tavaborole, Insulin Pen Needle, pravastatin, levothyroxine, Fenofibric Acid, pioglitazone-metformin, meloxicam, and insulin glargine.  Meds ordered this encounter  Medications  . aliskiren (TEKTURNA) 150 MG tablet    Sig: Take 1 tablet (150 mg total) by mouth daily.    Dispense:  90 tablet    Refill:  1     Follow-up: Return in about 3 months (around 11/17/2014).  Scarlette Calico, MD

## 2014-08-17 NOTE — Patient Instructions (Signed)

## 2014-08-19 ENCOUNTER — Encounter: Payer: Self-pay | Admitting: Internal Medicine

## 2014-09-23 ENCOUNTER — Encounter: Payer: Self-pay | Admitting: Neurology

## 2014-09-23 ENCOUNTER — Ambulatory Visit (INDEPENDENT_AMBULATORY_CARE_PROVIDER_SITE_OTHER): Payer: BLUE CROSS/BLUE SHIELD | Admitting: Neurology

## 2014-09-23 VITALS — BP 130/78 | HR 74 | Resp 16 | Ht 70.5 in | Wt 221.0 lb

## 2014-09-23 DIAGNOSIS — R42 Dizziness and giddiness: Secondary | ICD-10-CM

## 2014-09-23 NOTE — Progress Notes (Signed)
NEUROLOGY CONSULTATION NOTE  SAFWAN TOMEI MRN: [id number] DOB: [birth year]  Referring provider: Dr. Ronnald Ramp  Primary care provider: Dr. Ronnald Ramp  Reason for consult:  dizziness  HISTORY OF PRESENT ILLNESS: Terry Harrington is a 59 year old right-handed male with type I diabetes, hypertension, hyperlipidemia, pernicious anemia, and hypothyroidism who presents for dizziness.  History obtained from patient and PCP note.  Labs also reviewed.  About 2 or 3 months ago, he began experiencing episodes of lightheadedness upon standing.  It would occur while getting out of a vehicle or after having sat for 2 or 3 hours.  When he would stand up, he would experience weakness in the knees and feeling of lightheadedness.  There was no associated spinning sensation, diaphoresis, palpitations, facial numbness, unilateral facial or extremity weakness, or slurred speech.  The spell would last only a couple of seconds.  It wouldn't occur every time he stood up.  Initially, it occurred 2 or 3 times a day.  His PCP discontinued Bystolic.  Since then, it has steadily improved.  It occurred only 2 or 3 days last week and so far only once this week.  He reports tinnitus in the morning and some decreased hearing in the right ear, however he works around loud heavy machinery.  Recent labs include B12 346, Hgb A1c 7.4, TSH 4.12, Hgb 12.7, HCT 37.5  PAST MEDICAL HISTORY: Past Medical History  Diagnosis Date  . Hypertension     on meds  . Heart murmur   . Hypothyroidism     on meds  . GERD (gastroesophageal reflux disease)   . Diabetes mellitus   . Hyperlipidemia     PAST SURGICAL HISTORY: Past Surgical History  Procedure Laterality Date  . Hernia repair      as a child  . Dental surgery      several dental procedure approx 10 between 1999-2012  . Shoulder surgery  05/31/2011     impingment left  . Shoulder arthroscopy  05/31/2011    Procedure: ARTHROSCOPY SHOULDER;  Surgeon: Sharmon Revere, MD;   Location: [facility];  Service: Orthopedics;  Laterality: Left;  Shoulder Acromioplasty Left    MEDICATIONS: Current Outpatient Prescriptions on File Prior to Visit  Medication Sig Dispense Refill  . aliskiren (TEKTURNA) 150 MG tablet Take 1 tablet (150 mg total) by mouth daily. 90 tablet 1  . cholecalciferol (VITAMIN D) 1000 UNITS tablet Take 2,000 Units by mouth daily.    . Choline Fenofibrate (FENOFIBRIC ACID) 135 MG CPDR TAKE 1 CAPSULE EVERY DAY 90 capsule 1  . insulin glargine (LANTUS) 100 unit/mL SOPN Inject 0.2 mLs (20 Units total) into the skin at bedtime. 15 mL 11  . levothyroxine (SYNTHROID, LEVOTHROID) 100 MCG tablet TAKE 1 TABLET EVERY DAY 90 tablet 1  . Multiple Vitamin (MULITIVITAMIN WITH MINERALS) TABS Take 1 tablet by mouth daily.    . pioglitazone-metformin (ACTOPLUS MET) 15-850 MG per tablet TAKE 2 TABLETS EVERY DAY WITH SUPPER 180 tablet 1  . pravastatin (PRAVACHOL) 40 MG tablet Take 1 tablet (40 mg total) by mouth daily. 90 tablet 3  . Tavaborole (KERYDIN) 5 % SOLN Apply 1 drop each affected toenail once daily for 12 months 10 mL 10  . vitamin B-12 (CYANOCOBALAMIN) 1000 MCG tablet Take 2,000 mcg by mouth daily.     No current facility-administered medications on file prior to visit.    ALLERGIES: Allergies  Allergen Reactions  . Crab [Shellfish Allergy] Anaphylaxis  . Iodine Anaphylaxis  . Lisinopril  cough  . Crestor [Rosuvastatin]     Muscle aches  . Iohexol Swelling  . Peanut-Containing Drug Products Itching    And whelps  . Aspirin Rash  . Codeine Rash  . Penicillins Rash    FAMILY HISTORY: Family History  Problem Relation Age of Onset  . Anesthesia problems Neg Hx   . Hypotension Neg Hx   . Malignant hyperthermia Neg Hx   . Pseudochol deficiency Neg Hx   . Hyperlipidemia Mother   . Hypertension Mother   . Stroke Father   . Kidney disease Father   . Diabetes Father   . Cancer Maternal Grandfather     SOCIAL HISTORY: Social History   Social  History  . Marital Status: Divorced    Spouse Name: N/A  . Number of Children: N/A  . Years of Education: N/A   Occupational History  . Construction    Social History Main Topics  . Smoking status: Former Smoker -- 1.50 packs/day for 26 years    Types: Cigarettes    Quit date: 03/02/2008  . Smokeless tobacco: Former Systems developer    Quit date: 05/25/2008  . Alcohol Use: 0.0 oz/week    0 Standard drinks or equivalent per week     Comment: rarely  . Drug Use: No  . Sexual Activity: Not Currently   Other Topics Concern  . Not on file   Social History Narrative    REVIEW OF SYSTEMS: Constitutional: No fevers, chills, or sweats, no generalized fatigue, change in appetite Eyes: No visual changes, double vision, eye pain Ear, nose and throat: No hearing loss, ear pain, nasal congestion, sore throat Cardiovascular: No chest pain, palpitations Respiratory:  No shortness of breath at rest or with exertion, wheezes GastrointestinaI: No nausea, vomiting, diarrhea, abdominal pain, fecal incontinence Genitourinary:  No dysuria, urinary retention or frequency Musculoskeletal:  No neck pain, back pain Integumentary: No rash, pruritus, skin lesions Neurological: as above Psychiatric: No depression, insomnia, anxiety Endocrine: No palpitations, fatigue, diaphoresis, mood swings, change in appetite, change in weight, increased thirst Hematologic/Lymphatic:  No anemia, purpura, petechiae. Allergic/Immunologic: no itchy/runny eyes, nasal congestion, recent allergic reactions, rashes  PHYSICAL EXAM: Filed Vitals:   09/23/14 1237  BP: 130/78  Pulse: 74  Resp: 16  Orthostatics:  Supine 160/90, HR 70; Sitting 158/90, HR 72; Standing 154/90, HR 80. General: No acute distress.  Patient appears well-groomed.   Head:  Normocephalic/atraumatic Eyes:  fundi unremarkable, without vessel changes, exudates, hemorrhages or papilledema. Neck: supple, no paraspinal tenderness, full range of motion Back: No  paraspinal tenderness Heart: regular rate and rhythm Lungs: Clear to auscultation bilaterally. Vascular: No carotid bruits. Neurological Exam: Mental status: alert and oriented to person, place, and time, recent and remote memory intact, fund of knowledge intact, attention and concentration intact, speech fluent and not dysarthric, language intact. Cranial nerves: CN I: not tested CN II: pupils equal, round and reactive to light, visual fields intact, fundi unremarkable, without vessel changes, exudates, hemorrhages or papilledema. CN III, IV, VI:  full range of motion, no nystagmus, no ptosis CN V: facial sensation intact CN VII: upper and lower face symmetric CN VIII: Mildly decreased hearing in right ear. CN IX, X: gag intact, uvula midline CN XI: sternocleidomastoid and trapezius muscles intact CN XII: tongue midline Bulk & Tone: normal, no fasciculations. Motor:  5/5 throughout Sensation:  Pinprick and vibration sensation decreased in feet. Deep Tendon Reflexes:  2+ throughout, except absent in ankles, toes downgoing.  Finger to nose testing:  Without  dysmetria.  Heel to shin:  Without dysmetria.  Gait:  Normal station and stride.  Able to turn and tandem walk. Romberg negative.  IMPRESSION: Dizziness and lightheadedness.  It is probably related to the Bystolic, since there has been improvement after its discontinuation.  Given his uncontrolled diabetes, autonomic dysfunction is also a possibility.  Orthostatics were negative, however he was asymptomatic and he has been improving since onset of symptoms.  I don't suspect a cerebrovascular etiology given the absence of focal symptoms and brief duration of the spells.  Also, his neurological exam is unremarkable.  PLAN: 1.  I don't think there is any indication for imaging of the head 2.  Advised to take care when standing up 3.  Follow up as needed.  Thank you for allowing me to take part in the care of this patient.  Metta Clines, DO  CC:  Scarlette Calico, MD

## 2014-09-23 NOTE — Patient Instructions (Signed)
I think that the lightheadedness is related to fluctuation in blood pressure which seems to have improved since discontinuing the Bystolic.  Your exam looks okay and you don't exhibit any signs or symptoms to suggest something in the brain.  Please call with questions or concerns.

## 2014-10-01 ENCOUNTER — Telehealth: Payer: Self-pay

## 2014-10-01 NOTE — Telephone Encounter (Signed)
Pa initiated. Approved through 09/30/2016 via covermymeds

## 2014-11-16 ENCOUNTER — Encounter: Payer: Self-pay | Admitting: Internal Medicine

## 2014-11-16 ENCOUNTER — Ambulatory Visit (INDEPENDENT_AMBULATORY_CARE_PROVIDER_SITE_OTHER): Payer: BLUE CROSS/BLUE SHIELD | Admitting: Internal Medicine

## 2014-11-16 ENCOUNTER — Other Ambulatory Visit (INDEPENDENT_AMBULATORY_CARE_PROVIDER_SITE_OTHER): Payer: BLUE CROSS/BLUE SHIELD

## 2014-11-16 VITALS — BP 138/72 | HR 69 | Temp 98.3°F | Resp 16 | Ht 70.5 in | Wt 216.0 lb

## 2014-11-16 DIAGNOSIS — Z23 Encounter for immunization: Secondary | ICD-10-CM

## 2014-11-16 DIAGNOSIS — E118 Type 2 diabetes mellitus with unspecified complications: Secondary | ICD-10-CM

## 2014-11-16 DIAGNOSIS — E038 Other specified hypothyroidism: Secondary | ICD-10-CM | POA: Diagnosis not present

## 2014-11-16 DIAGNOSIS — I10 Essential (primary) hypertension: Secondary | ICD-10-CM

## 2014-11-16 DIAGNOSIS — D510 Vitamin B12 deficiency anemia due to intrinsic factor deficiency: Secondary | ICD-10-CM

## 2014-11-16 LAB — CBC WITH DIFFERENTIAL/PLATELET
BASOS ABS: 0 10*3/uL (ref 0.0–0.1)
BASOS PCT: 0.8 % (ref 0.0–3.0)
EOS ABS: 0.1 10*3/uL (ref 0.0–0.7)
Eosinophils Relative: 1.6 % (ref 0.0–5.0)
HCT: 40 % (ref 39.0–52.0)
HEMOGLOBIN: 13.6 g/dL (ref 13.0–17.0)
Lymphocytes Relative: 45.3 % (ref 12.0–46.0)
Lymphs Abs: 2.6 10*3/uL (ref 0.7–4.0)
MCHC: 33.9 g/dL (ref 30.0–36.0)
MCV: 87.8 fl (ref 78.0–100.0)
MONO ABS: 0.3 10*3/uL (ref 0.1–1.0)
Monocytes Relative: 5.8 % (ref 3.0–12.0)
Neutro Abs: 2.7 10*3/uL (ref 1.4–7.7)
Neutrophils Relative %: 46.5 % (ref 43.0–77.0)
PLATELETS: 329 10*3/uL (ref 150.0–400.0)
RBC: 4.55 Mil/uL (ref 4.22–5.81)
RDW: 14.6 % (ref 11.5–15.5)
WBC: 5.8 10*3/uL (ref 4.0–10.5)

## 2014-11-16 LAB — BASIC METABOLIC PANEL
BUN: 11 mg/dL (ref 6–23)
CALCIUM: 10 mg/dL (ref 8.4–10.5)
CHLORIDE: 105 meq/L (ref 96–112)
CO2: 25 mEq/L (ref 19–32)
CREATININE: 0.97 mg/dL (ref 0.40–1.50)
GFR: 101.16 mL/min (ref >60.00)
Glucose, Bld: 121 mg/dL — ABNORMAL HIGH (ref 70–99)
Potassium: 3.8 mEq/L (ref 3.5–5.1)
Sodium: 140 mEq/L (ref 135–145)

## 2014-11-16 LAB — TSH: TSH: 2.82 u[IU]/mL (ref 0.35–4.50)

## 2014-11-16 LAB — HEMOGLOBIN A1C: HEMOGLOBIN A1C: 7.2 % — AB (ref 4.6–6.5)

## 2014-11-16 NOTE — Progress Notes (Signed)
Pre visit review using our clinic review tool, if applicable. No additional management support is needed unless otherwise documented below in the visit note. 

## 2014-11-16 NOTE — Progress Notes (Signed)
Subjective:  Patient ID: Terry Harrington, male    DOB: November 27, 1955  Age: 59 y.o. MRN: [id number]  CC: Hypertension; Hypothyroidism; and Diabetes   HPI ATILLA ZOLLNER presents for f/up - he feels well and offers no complaints.  Outpatient Prescriptions Prior to Visit  Medication Sig Dispense Refill  . aliskiren (TEKTURNA) 150 MG tablet Take 1 tablet (150 mg total) by mouth daily. 90 tablet 1  . cholecalciferol (VITAMIN D) 1000 UNITS tablet Take 2,000 Units by mouth daily.    . Choline Fenofibrate (FENOFIBRIC ACID) 135 MG CPDR TAKE 1 CAPSULE EVERY DAY 90 capsule 1  . insulin glargine (LANTUS) 100 unit/mL SOPN Inject 0.2 mLs (20 Units total) into the skin at bedtime. 15 mL 11  . levothyroxine (SYNTHROID, LEVOTHROID) 100 MCG tablet TAKE 1 TABLET EVERY DAY 90 tablet 1  . Multiple Vitamin (MULITIVITAMIN WITH MINERALS) TABS Take 1 tablet by mouth daily.    . pioglitazone-metformin (ACTOPLUS MET) 15-850 MG per tablet TAKE 2 TABLETS EVERY DAY WITH SUPPER 180 tablet 1  . pravastatin (PRAVACHOL) 40 MG tablet Take 1 tablet (40 mg total) by mouth daily. 90 tablet 3  . Tavaborole (KERYDIN) 5 % SOLN Apply 1 drop each affected toenail once daily for 12 months 10 mL 10  . vitamin B-12 (CYANOCOBALAMIN) 1000 MCG tablet Take 2,000 mcg by mouth daily.     No facility-administered medications prior to visit.    ROS Review of Systems  Constitutional: Negative.  Negative for fever, chills, diaphoresis, appetite change and fatigue.  HENT: Negative.   Eyes: Negative.   Respiratory: Negative.  Negative for cough, choking, shortness of breath and stridor.   Cardiovascular: Negative.  Negative for chest pain, palpitations and leg swelling.  Gastrointestinal: Negative.  Negative for nausea, vomiting, abdominal pain, diarrhea, constipation and blood in stool.  Endocrine: Negative.   Genitourinary: Negative.  Negative for difficulty urinating.  Musculoskeletal: Negative.  Negative for myalgias, back pain  and arthralgias.  Skin: Negative.   Allergic/Immunologic: Negative.   Neurological: Negative.  Negative for dizziness, tremors, light-headedness and numbness.  Hematological: Negative.  Negative for adenopathy. Does not bruise/bleed easily.  Psychiatric/Behavioral: Negative.     Objective:  BP 138/72 mmHg  Pulse 69  Temp(Src) 98.3 F (36.8 C) (Oral)  Resp 16  Ht 5' 10.5" (1.791 m)  Wt 216 lb (97.977 kg)  BMI 30.54 kg/m2  SpO2 98%  BP Readings from Last 3 Encounters:  11/16/14 138/72  09/23/14 130/78  08/17/14 144/80    Wt Readings from Last 3 Encounters:  11/16/14 216 lb (97.977 kg)  09/23/14 221 lb (100.245 kg)  08/05/14 225 lb (102.059 kg)    Physical Exam  Constitutional: He is oriented to person, place, and time. He appears well-developed and well-nourished. No distress.  HENT:  Head: Normocephalic and atraumatic.  Mouth/Throat: Oropharynx is clear and moist. No oropharyngeal exudate.  Eyes: Conjunctivae are normal. Right eye exhibits no discharge. Left eye exhibits no discharge. No scleral icterus.  Neck: Normal range of motion. Neck supple. No JVD present. No tracheal deviation present. No thyromegaly present.  Cardiovascular: Normal rate, regular rhythm, normal heart sounds and intact distal pulses.  Exam reveals no gallop and no friction rub.   No murmur heard. Pulmonary/Chest: Effort normal and breath sounds normal. No stridor. No respiratory distress. He has no wheezes. He has no rales. He exhibits no tenderness.  Abdominal: Soft. Bowel sounds are normal. He exhibits no distension and no mass. There is no tenderness. There is  no rebound and no guarding.  Musculoskeletal: Normal range of motion. He exhibits no edema or tenderness.  Lymphadenopathy:    He has no cervical adenopathy.  Neurological: He is oriented to person, place, and time.  Skin: Skin is warm and dry. No rash noted. He is not diaphoretic. No erythema. No pallor.  Vitals reviewed.   Lab  Results  Component Value Date   WBC 5.8 11/16/2014   HGB 13.6 11/16/2014   HCT 40.0 11/16/2014   PLT 329.0 11/16/2014   GLUCOSE 121* 11/16/2014   CHOL 132 01/23/2014   TRIG 72.0 01/23/2014   HDL 58.10 01/23/2014   LDLDIRECT 132.8 02/21/2013   LDLCALC 60 01/23/2014   ALT 11 01/23/2014   AST 16 01/23/2014   NA 140 11/16/2014   K 3.8 11/16/2014   CL 105 11/16/2014   CREATININE 0.97 11/16/2014   BUN 11 11/16/2014   CO2 25 11/16/2014   TSH 2.82 11/16/2014   PSA 0.09* 02/21/2013   HGBA1C 7.2* 11/16/2014   MICROALBUR 0.6 02/21/2013    Dg Elbow Complete Left  06/12/2014  CLINICAL DATA:  59 year old male with burning elbow joint pain for 2 months with no known injury. Initial encounter. EXAM: LEFT ELBOW - COMPLETE 3+ VIEW COMPARISON:  Left shoulder series [phone] FINDINGS: The anterior fat pad is prominent suggesting a joint effusion, however, no posterior fat pad sign is evident. Bone mineralization is within normal limits. Joint spaces and alignment are within normal limits for age. The distal humerus, visible radius and ulna appear intact. No periarticular erosions or acute osseous abnormality identified. IMPRESSION: Suggestion of joint effusion but no acute osseous abnormality identified about the left elbow. Electronically Signed   By: Genevie Ann M.D.   On: 06/12/2014 07:53    Assessment & Plan:   Malakie was seen today for hypertension, hypothyroidism and diabetes.  Diagnoses and all orders for this visit:  Essential hypertension- his BP is well controlled. Lytes and renal function are stable. Will cont the current meds. -     Basic metabolic panel; Future  Need for influenza vaccination -     Flu Vaccine QUAD 36+ mos IM  Other specified hypothyroidism- his TSh is on the normal range, will stay on the current dose -     TSH; Future  Type 2 diabetes mellitus with complication, without long-term current use of insulin ([place])- his A1C is down to 7.2%, will cont the current meds. -      Hemoglobin A1c; Future  Pernicious anemia- improvement noted -     CBC with Differential/Platelet; Future   I am having Mr. Mcfarland maintain his multivitamin with minerals, vitamin B-12, cholecalciferol, Tavaborole, pravastatin, levothyroxine, Fenofibric Acid, pioglitazone-metformin, insulin glargine, and aliskiren.  No orders of the defined types were placed in this encounter.     Follow-up: Return in about 4 months (around 03/16/2015).  Scarlette Calico, MD

## 2014-11-16 NOTE — Patient Instructions (Signed)

## 2014-11-17 ENCOUNTER — Encounter: Payer: Self-pay | Admitting: Internal Medicine

## 2015-01-27 LAB — HM DIABETES EYE EXAM

## 2015-02-12 ENCOUNTER — Encounter (HOSPITAL_COMMUNITY): Payer: Self-pay

## 2015-02-12 ENCOUNTER — Observation Stay (HOSPITAL_COMMUNITY)
Admission: EM | Admit: 2015-02-12 | Discharge: 2015-02-14 | Disposition: A | Discharge status: Home / Self Care | Payer: BLUE CROSS/BLUE SHIELD | Attending: Internal Medicine | Admitting: Internal Medicine

## 2015-02-12 DIAGNOSIS — Z79899 Other long term (current) drug therapy: Secondary | ICD-10-CM | POA: Diagnosis not present

## 2015-02-12 DIAGNOSIS — K219 Gastro-esophageal reflux disease without esophagitis: Secondary | ICD-10-CM | POA: Insufficient documentation

## 2015-02-12 DIAGNOSIS — I10 Essential (primary) hypertension: Secondary | ICD-10-CM | POA: Diagnosis not present

## 2015-02-12 DIAGNOSIS — Z87891 Personal history of nicotine dependence: Secondary | ICD-10-CM | POA: Diagnosis not present

## 2015-02-12 DIAGNOSIS — Z794 Long term (current) use of insulin: Secondary | ICD-10-CM | POA: Insufficient documentation

## 2015-02-12 DIAGNOSIS — E785 Hyperlipidemia, unspecified: Secondary | ICD-10-CM | POA: Insufficient documentation

## 2015-02-12 DIAGNOSIS — E118 Type 2 diabetes mellitus with unspecified complications: Secondary | ICD-10-CM | POA: Diagnosis present

## 2015-02-12 DIAGNOSIS — E039 Hypothyroidism, unspecified: Secondary | ICD-10-CM | POA: Insufficient documentation

## 2015-02-12 DIAGNOSIS — R42 Dizziness and giddiness: Secondary | ICD-10-CM | POA: Diagnosis present

## 2015-02-12 DIAGNOSIS — R739 Hyperglycemia, unspecified: Secondary | ICD-10-CM

## 2015-02-12 DIAGNOSIS — E1165 Type 2 diabetes mellitus with hyperglycemia: Secondary | ICD-10-CM | POA: Diagnosis not present

## 2015-02-12 DIAGNOSIS — D510 Vitamin B12 deficiency anemia due to intrinsic factor deficiency: Secondary | ICD-10-CM | POA: Diagnosis not present

## 2015-02-12 LAB — BASIC METABOLIC PANEL
ANION GAP: 11 (ref 5–15)
BUN: 13 mg/dL (ref 6–20)
CO2: 22 mmol/L (ref 22–32)
Calcium: 9.5 mg/dL (ref 8.9–10.3)
Chloride: 107 mmol/L (ref 101–111)
Creatinine, Ser: 1.01 mg/dL (ref 0.61–1.24)
GFR calc Af Amer: >60 mL/min (ref >60)
GLUCOSE: 424 mg/dL — AB (ref 65–99)
POTASSIUM: 3.8 mmol/L (ref 3.5–5.1)
Sodium: 140 mmol/L (ref 135–145)

## 2015-02-12 LAB — URINALYSIS, ROUTINE W REFLEX MICROSCOPIC
BILIRUBIN URINE: NEGATIVE
Glucose, UA: >1000 mg/dL — AB
Hgb urine dipstick: NEGATIVE
Ketones, ur: NEGATIVE mg/dL
Leukocytes, UA: NEGATIVE
NITRITE: NEGATIVE
PH: 8 (ref 5.0–8.0)
Protein, ur: NEGATIVE mg/dL
SPECIFIC GRAVITY, URINE: 1.03 (ref 1.005–1.030)

## 2015-02-12 LAB — CBC
HEMATOCRIT: 39 % (ref 39.0–52.0)
HEMOGLOBIN: 13.1 g/dL (ref 13.0–17.0)
MCH: 29.6 pg (ref 26.0–34.0)
MCHC: 33.6 g/dL (ref 30.0–36.0)
MCV: 88 fL (ref 78.0–100.0)
Platelets: 277 10*3/uL (ref 150–400)
RBC: 4.43 MIL/uL (ref 4.22–5.81)
RDW: 13.6 % (ref 11.5–15.5)
WBC: 5.6 10*3/uL (ref 4.0–10.5)

## 2015-02-12 LAB — CBG MONITORING, ED
GLUCOSE-CAPILLARY: 332 mg/dL — AB (ref 65–99)
Glucose-Capillary: 371 mg/dL — ABNORMAL HIGH (ref 65–99)

## 2015-02-12 LAB — I-STAT TROPONIN, ED: TROPONIN I, POC: 0 ng/mL (ref 0.00–0.08)

## 2015-02-12 LAB — URINE MICROSCOPIC-ADD ON: RBC / HPF: NONE SEEN RBC/hpf (ref 0–5)

## 2015-02-12 LAB — I-STAT CG4 LACTIC ACID, ED: LACTIC ACID, VENOUS: 1.51 mmol/L (ref 0.5–2.0)

## 2015-02-12 MED ORDER — INSULIN ASPART 100 UNIT/ML ~~LOC~~ SOLN
10.0000 [IU] | Freq: Once | SUBCUTANEOUS | Status: AC
  Administered 2015-02-13: 10 [IU] via INTRAVENOUS
  Filled 2015-02-12: qty 1

## 2015-02-12 MED ORDER — SODIUM CHLORIDE 0.9 % IV BOLUS (SEPSIS): 1000.0000 mL | Freq: Once | INTRAVENOUS | Status: DC

## 2015-02-12 MED ORDER — SODIUM CHLORIDE 0.9 % IV BOLUS (SEPSIS)
1000.0000 mL | Freq: Once | INTRAVENOUS | Status: AC
  Administered 2015-02-12: 1000 mL via INTRAVENOUS

## 2015-02-12 MED ORDER — MECLIZINE HCL 25 MG PO TABS
25.0000 mg | ORAL_TABLET | Freq: Once | ORAL | Status: AC
  Administered 2015-02-13: 25 mg via ORAL
  Filled 2015-02-12: qty 1

## 2015-02-12 NOTE — ED Notes (Signed)
Bed: QM57 Expected date:  Expected time:  Means of arrival:  Comments: EMS 60 yo male dizziness, near syncope, CBG 440, nausea, Zofran 4 mg IV, 12 lead unremarkable 156/74

## 2015-02-12 NOTE — ED Notes (Signed)
According to EMS, pt c/o acute onset of dizziness nausea and near syncopal episode when attempting to stand. Pt BSG with EMS 440. Pt arrives to [facility] ED A+OX4, speaking in complete sentences, feeling dizzy.

## 2015-02-12 NOTE — ED Provider Notes (Addendum)
CSN: [id number]     Arrival date & time 02/12/15  2157 History   First MD Initiated Contact with Patient 02/12/15 2205     Chief Complaint  Patient presents with  . Hyperglycemia     (Consider location/radiation/quality/duration/timing/severity/associated sxs/prior Treatment) HPI Comments: Pt states has not checked blood sugar since Tuesday but has been taking his diabetic meds but states has not been following diabetic diet and this afternoon starting feeling bad with nausea, dizziness and near syncope.  Pt denies chest pain, dysuria, fever, SOB or abd pain.  Pt states that he missed BP med yesterday but took it today.  Normally pressures are in the 120's.  Patient is a 60 y.o. male presenting with hyperglycemia. The history is provided by the patient.  Hyperglycemia Blood sugar level PTA:  >400 Severity:  Severe Onset quality:  Gradual Timing:  Constant Progression:  Worsening Chronicity:  New Diabetes status:  Controlled with oral medications and controlled with insulin Current diabetic therapy:  Lantus qhs and metformin Time since last antidiabetic medication: today. Context: recent change in diet   Context: not change in medication   Relieved by:  None tried Ineffective treatments:  None tried Associated symptoms: dizziness, increased thirst and nausea   Associated symptoms: no altered mental status, no blurred vision, no chest pain, no confusion, no dysuria, no fever and no shortness of breath   Associated symptoms comment:  Lightheaded when standing Risk factors: no hx of DKA     Past Medical History  Diagnosis Date  . Hypertension     on meds  . Heart murmur   . Hypothyroidism     on meds  . GERD (gastroesophageal reflux disease)   . Diabetes mellitus   . Hyperlipidemia    Past Surgical History  Procedure Laterality Date  . Hernia repair      as a child  . Dental surgery      several dental procedure approx 10 between 1999-2012  . Shoulder surgery  05/31/2011      impingment left  . Shoulder arthroscopy  05/31/2011    Procedure: ARTHROSCOPY SHOULDER;  Surgeon: Kennieth Rad, MD;  Location: [facility] OR;  Service: Orthopedics;  Laterality: Left;  Shoulder Acromioplasty Left   Family History  Problem Relation Age of Onset  . Anesthesia problems Neg Hx   . Hypotension Neg Hx   . Malignant hyperthermia Neg Hx   . Pseudochol deficiency Neg Hx   . Hyperlipidemia Mother   . Hypertension Mother   . Stroke Father   . Kidney disease Father   . Diabetes Father   . Cancer Maternal Grandfather    Social History  Substance Use Topics  . Smoking status: Former Smoker -- 1.50 packs/day for 26 years    Types: Cigarettes    Quit date: 03/02/2008  . Smokeless tobacco: Former Neurosurgeon    Quit date: 05/25/2008  . Alcohol Use: 0.0 oz/week    0 Standard drinks or equivalent per week     Comment: rarely    Review of Systems  Constitutional: Negative for fever.  Eyes: Negative for blurred vision.  Respiratory: Negative for shortness of breath.   Cardiovascular: Negative for chest pain.  Gastrointestinal: Positive for nausea.  Endocrine: Positive for polydipsia.  Genitourinary: Negative for dysuria.  Neurological: Positive for dizziness.  Psychiatric/Behavioral: Negative for confusion.  All other systems reviewed and are negative.     Allergies  Crab; Iodine; Lisinopril; Crestor; Iohexol; Peanut-containing drug products; Aspirin; Codeine; and Penicillins  Home  Medications   Prior to Admission medications   Medication Sig Start Date End Date Taking? Authorizing Provider  aliskiren (TEKTURNA) 150 MG tablet Take 1 tablet (150 mg total) by mouth daily. 08/17/14  Yes Janith Lima, MD  cholecalciferol (VITAMIN D) 1000 UNITS tablet Take 2,000 Units by mouth daily.   Yes Historical Provider, MD  Choline Fenofibrate (FENOFIBRIC ACID) 135 MG CPDR TAKE 1 CAPSULE EVERY DAY 04/15/14  Yes Janith Lima, MD  insulin glargine (LANTUS) 100 unit/mL SOPN Inject 0.2 mLs (20  Units total) into the skin at bedtime. 08/10/14  Yes Janith Lima, MD  levothyroxine (SYNTHROID, LEVOTHROID) 100 MCG tablet TAKE 1 TABLET EVERY DAY 04/13/14  Yes Janith Lima, MD  Multiple Vitamin (MULITIVITAMIN WITH MINERALS) TABS Take 1 tablet by mouth daily.   Yes Historical Provider, MD  pioglitazone-metformin (ACTOPLUS MET) 15-850 MG per tablet TAKE 2 TABLETS EVERY DAY WITH SUPPER 06/05/14  Yes Janith Lima, MD  pravastatin (PRAVACHOL) 40 MG tablet Take 1 tablet (40 mg total) by mouth daily. 01/23/14  Yes Janith Lima, MD  vitamin B-12 (CYANOCOBALAMIN) 1000 MCG tablet Take 2,000 mcg by mouth daily.   Yes Historical Provider, MD   BP 182/71 mmHg  Pulse 62  Temp(Src) 97.5 F (36.4 C) (Oral)  Resp 20  SpO2 97% Physical Exam  Constitutional: He is oriented to person, place, and time. He appears well-developed and well-nourished. No distress.  HENT:  Head: Normocephalic and atraumatic.  Mouth/Throat: Oropharynx is clear and moist.  Eyes: Conjunctivae and EOM are normal. Pupils are equal, round, and reactive to light.  Neck: Normal range of motion. Neck supple.  Cardiovascular: Normal rate, regular rhythm and intact distal pulses.   No murmur heard. Pulmonary/Chest: Effort normal and breath sounds normal. No respiratory distress. He has no wheezes. He has no rales.  Abdominal: Soft. He exhibits no distension. There is no tenderness. There is no rebound and no guarding.  Musculoskeletal: Normal range of motion. He exhibits no edema or tenderness.  Neurological: He is alert and oriented to person, place, and time.  Skin: Skin is warm and dry. No rash noted. No erythema.  Psychiatric: He has a normal mood and affect. His behavior is normal.  Nursing note and vitals reviewed.   ED Course  Procedures (including critical care time) Labs Review Labs Reviewed  BASIC METABOLIC PANEL - Abnormal; Notable for the following:    Glucose, Bld 424 (*)    All other components within normal  limits  URINALYSIS, ROUTINE W REFLEX MICROSCOPIC (NOT AT [facility]) - Abnormal; Notable for the following:    Glucose, UA >1000 (*)    All other components within normal limits  URINE MICROSCOPIC-ADD ON - Abnormal; Notable for the following:    Squamous Epithelial / LPF 0-5 (*)    Bacteria, UA RARE (*)    All other components within normal limits  CBG MONITORING, ED - Abnormal; Notable for the following:    Glucose-Capillary 371 (*)    All other components within normal limits  CBC  I-STAT CG4 LACTIC ACID, ED  I-STAT TROPOININ, ED    Imaging Review No results found. I have personally reviewed and evaluated these images and lab results as part of my medical decision-making.   EKG Interpretation   Date/Time:  Friday February 12 2015 22:28:43 EST Ventricular Rate:  56 PR Interval:  165 QRS Duration: 103 QT Interval:  454 QTC Calculation: 438 R Axis:   67 Text Interpretation:  Sinus rhythm  Normal ECG No significant change since  last tracing Confirmed by [facility]  MD, Loree Fee ([id number]) on 02/12/2015  11:23:09 PM      MDM   Final diagnoses:  None    Pt with near syncope, dizziness and nausea today with elevated BS.  Pt states blood sugars are usually <100 and today in the 400's.  Still taking meds but he has not been following the diet and lots of stress this week.  Denies any infectious sx.  No chest pain or SOB.  Low suspicion for ACS and no signs of infection.  Sx most likely related to hyperglycemia.  No signs of DKA.  Pt given IVF.  BMP with hyperglycemia but no anion gap.  CBC, UA, trop and EKG wnl.  Will treat hyperglycemia with fluids and insulin.  Will ensure pt feeling better before d/c home.  11:58 PM When patient attempted to walk to the bathroom he was feeling very dizzy and now he describes it as a more vertiginous symptom. He does have a history of vertigo and states that this feels similar. Patient was treated with meclizine and will recheck.  Patient's symptoms are  reproducible with positioning and movement which seems more peripheral than strokelike.  Blanchie Dessert, MD 02/12/15 [id number]  Blanchie Dessert, MD 02/12/15 [phone]

## 2015-02-13 ENCOUNTER — Encounter (HOSPITAL_COMMUNITY): Payer: Self-pay | Admitting: Family Medicine

## 2015-02-13 ENCOUNTER — Observation Stay (HOSPITAL_BASED_OUTPATIENT_CLINIC_OR_DEPARTMENT_OTHER): Payer: BLUE CROSS/BLUE SHIELD

## 2015-02-13 ENCOUNTER — Emergency Department (HOSPITAL_COMMUNITY): Payer: BLUE CROSS/BLUE SHIELD

## 2015-02-13 ENCOUNTER — Observation Stay (HOSPITAL_COMMUNITY): Payer: BLUE CROSS/BLUE SHIELD

## 2015-02-13 DIAGNOSIS — H8110 Benign paroxysmal vertigo, unspecified ear: Secondary | ICD-10-CM

## 2015-02-13 DIAGNOSIS — E038 Other specified hypothyroidism: Secondary | ICD-10-CM

## 2015-02-13 DIAGNOSIS — E118 Type 2 diabetes mellitus with unspecified complications: Secondary | ICD-10-CM | POA: Diagnosis not present

## 2015-02-13 DIAGNOSIS — D510 Vitamin B12 deficiency anemia due to intrinsic factor deficiency: Secondary | ICD-10-CM

## 2015-02-13 DIAGNOSIS — R42 Dizziness and giddiness: Secondary | ICD-10-CM

## 2015-02-13 DIAGNOSIS — I10 Essential (primary) hypertension: Secondary | ICD-10-CM

## 2015-02-13 LAB — CBG MONITORING, ED
GLUCOSE-CAPILLARY: 276 mg/dL — AB (ref 65–99)
GLUCOSE-CAPILLARY: 210 mg/dL — AB (ref 65–99)
Glucose-Capillary: 284 mg/dL — ABNORMAL HIGH (ref 65–99)

## 2015-02-13 LAB — RAPID URINE DRUG SCREEN, HOSP PERFORMED
AMPHETAMINES: NOT DETECTED
BARBITURATES: NOT DETECTED
Benzodiazepines: NOT DETECTED
Cocaine: NOT DETECTED
Opiates: NOT DETECTED
TETRAHYDROCANNABINOL: NOT DETECTED

## 2015-02-13 LAB — VITAMIN B12: VITAMIN B 12: 338 pg/mL (ref 180–914)

## 2015-02-13 LAB — GLUCOSE, CAPILLARY
GLUCOSE-CAPILLARY: 184 mg/dL — AB (ref 65–99)
GLUCOSE-CAPILLARY: 267 mg/dL — AB (ref 65–99)
Glucose-Capillary: 191 mg/dL — ABNORMAL HIGH (ref 65–99)
Glucose-Capillary: 159 mg/dL — ABNORMAL HIGH (ref 65–99)
Glucose-Capillary: 203 mg/dL — ABNORMAL HIGH (ref 65–99)

## 2015-02-13 LAB — I-STAT CG4 LACTIC ACID, ED: Lactic Acid, Venous: 1.34 mmol/L (ref 0.5–2.0)

## 2015-02-13 MED ORDER — FENOFIBRATE 160 MG PO TABS
160.0000 mg | ORAL_TABLET | Freq: Every day | ORAL | Status: DC
  Administered 2015-02-13 – 2015-02-14 (×2): 160 mg via ORAL
  Filled 2015-02-13 (×2): qty 1

## 2015-02-13 MED ORDER — MECLIZINE HCL 25 MG PO TABS
25.0000 mg | ORAL_TABLET | Freq: Three times a day (TID) | ORAL | Status: DC | PRN
  Filled 2015-02-13: qty 1

## 2015-02-13 MED ORDER — VITAMIN B-12 1000 MCG PO TABS
2000.0000 ug | ORAL_TABLET | Freq: Every day | ORAL | Status: DC
  Administered 2015-02-13 – 2015-02-14 (×2): 2000 ug via ORAL
  Filled 2015-02-13 (×2): qty 2

## 2015-02-13 MED ORDER — INSULIN ASPART 100 UNIT/ML ~~LOC~~ SOLN
0.0000 [IU] | Freq: Three times a day (TID) | SUBCUTANEOUS | Status: DC
  Administered 2015-02-13: 8 [IU] via SUBCUTANEOUS
  Administered 2015-02-13 – 2015-02-14 (×3): 3 [IU] via SUBCUTANEOUS
  Administered 2015-02-14: 5 [IU] via SUBCUTANEOUS

## 2015-02-13 MED ORDER — INSULIN GLARGINE 100 UNIT/ML ~~LOC~~ SOLN
20.0000 [IU] | Freq: Every day | SUBCUTANEOUS | Status: DC
  Administered 2015-02-13: 20 [IU] via SUBCUTANEOUS
  Filled 2015-02-13 (×2): qty 0.2

## 2015-02-13 MED ORDER — INSULIN ASPART 100 UNIT/ML ~~LOC~~ SOLN
0.0000 [IU] | Freq: Every day | SUBCUTANEOUS | Status: DC
  Administered 2015-02-13: 2 [IU] via SUBCUTANEOUS

## 2015-02-13 MED ORDER — LEVOTHYROXINE SODIUM 100 MCG PO TABS
100.0000 ug | ORAL_TABLET | Freq: Every day | ORAL | Status: DC
  Administered 2015-02-13 – 2015-02-14 (×2): 100 ug via ORAL
  Filled 2015-02-13 (×4): qty 1

## 2015-02-13 MED ORDER — ALISKIREN FUMARATE 150 MG PO TABS
150.0000 mg | ORAL_TABLET | Freq: Every day | ORAL | Status: DC
  Administered 2015-02-13 – 2015-02-14 (×2): 150 mg via ORAL
  Filled 2015-02-13 (×2): qty 1

## 2015-02-13 MED ORDER — MECLIZINE HCL 25 MG PO TABS
25.0000 mg | ORAL_TABLET | Freq: Once | ORAL | Status: AC
  Administered 2015-02-13: 25 mg via ORAL
  Filled 2015-02-13: qty 1

## 2015-02-13 MED ORDER — PIOGLITAZONE HCL 15 MG PO TABS
15.0000 mg | ORAL_TABLET | Freq: Every day | ORAL | Status: DC
  Administered 2015-02-13: 15 mg via ORAL
  Filled 2015-02-13 (×2): qty 1

## 2015-02-13 MED ORDER — ONDANSETRON HCL 4 MG PO TABS: 8.0000 mg | ORAL_TABLET | Freq: Three times a day (TID) | ORAL | Status: DC | PRN

## 2015-02-13 MED ORDER — METFORMIN HCL 850 MG PO TABS
850.0000 mg | ORAL_TABLET | Freq: Two times a day (BID) | ORAL | Status: DC
  Administered 2015-02-13 – 2015-02-14 (×3): 850 mg via ORAL
  Filled 2015-02-13 (×5): qty 1

## 2015-02-13 MED ORDER — PROMETHAZINE HCL 25 MG PO TABS: 12.5000 mg | ORAL_TABLET | Freq: Four times a day (QID) | ORAL | Status: DC | PRN

## 2015-02-13 MED ORDER — PRAVASTATIN SODIUM 40 MG PO TABS
40.0000 mg | ORAL_TABLET | Freq: Every day | ORAL | Status: DC
  Administered 2015-02-13: 40 mg via ORAL
  Filled 2015-02-13 (×2): qty 1

## 2015-02-13 MED ORDER — PIOGLITAZONE HCL-METFORMIN HCL 15-850 MG PO TABS
2.0000 | ORAL_TABLET | Freq: Every day | ORAL | Status: DC
Start: 2015-02-13 — End: 2015-02-13

## 2015-02-13 MED ORDER — INSULIN ASPART 100 UNIT/ML ~~LOC~~ SOLN
3.0000 [IU] | Freq: Once | SUBCUTANEOUS | Status: AC
  Administered 2015-02-13: 3 [IU] via SUBCUTANEOUS
  Filled 2015-02-13: qty 1

## 2015-02-13 NOTE — H&P (Signed)
History and Physical  Patient Name: Terry Harrington     BJY:[id number]    DOB: [birth year]    DOA: 02/12/2015 Referring physician: April Palumbo, MD PCP: Scarlette Calico, MD      Chief Complaint: Vertigo  HPI: Terry Harrington is a 60 y.o. male with a past medical history significant for IDDM, hypothyroidism and HTN who presents with vertigo for two hours.   There was initial confusion from EDP about the patient's symptoms, but he relates to me that he was in his normal state of health until today around 6PM when he stood up and had sudden onset vertigo, "feeling of falling forward" nausea, and feeling wobbly on his feet when he went to walk to the bathroom.  He lay down and this went away, but when he stood up again, the symptoms returned.  His wife called, and she was concerned, so she brought him to the ER.  In the ED, he was hyperglycemic.  His symptoms worsened with laying down, and he would have severe nausea/sensation of spinning for a few minutes that would slowly subside.  He was afebrile, hemodynamically stable.  K 3.8, bicarb 22, Cr 1.0, AG normal.  WBC 5.6K, Hgb 13, lactate normal, TNI negative.  A CT of the head was obtained, which was normal.  An ECG showed rate 56, sinus rhythm and no ST changes.  The patient denied focal weakness, confusion, slurred speech, or syncope.  He had prominent nausea, episodic vertigo, positional.  From chart review, he had episodes of presycnope with standing last fall related to Bystolic, for which he was seen by Neurology, but he has never had true vertigo.      Review of Systems:  Pt complains of vertigo, nausea. Pt denies any confusion, seizures, syncope, weakness, speech problems, headaches, or numbness/tingling.  All other systems negative except as just noted or noted in the history of present illness.   Allergies  Allergen Reactions  . Crab [Shellfish Allergy] Anaphylaxis  . Iodine Anaphylaxis  . Lisinopril     cough  . Crestor  [Rosuvastatin]     Muscle aches  . Iohexol Swelling  . Peanut-Containing Drug Products Itching    And whelps  . Aspirin Rash  . Codeine Rash  . Penicillins Rash    Has patient had a PCN reaction causing immediate rash, facial/tongue/throat swelling, SOB or lightheadedness with hypotension: Yes Has patient had a PCN reaction causing severe rash involving mucus membranes or skin necrosis: Yes Has patient had a PCN reaction that required hospitalization No Has patient had a PCN reaction occurring within the last 10 years: No If all of the above answers are "NO", then may proceed with Cephalosporin use.     Prior to Admission medications   Medication Sig Start Date End Date Taking? Authorizing Provider  aliskiren (TEKTURNA) 150 MG tablet Take 1 tablet (150 mg total) by mouth daily. 08/17/14  Yes Janith Lima, MD  cholecalciferol (VITAMIN D) 1000 UNITS tablet Take 2,000 Units by mouth daily.   Yes Historical Provider, MD  Choline Fenofibrate (FENOFIBRIC ACID) 135 MG CPDR TAKE 1 CAPSULE EVERY DAY 04/15/14  Yes Janith Lima, MD  insulin glargine (LANTUS) 100 unit/mL SOPN Inject 0.2 mLs (20 Units total) into the skin at bedtime. 08/10/14  Yes Janith Lima, MD  levothyroxine (SYNTHROID, LEVOTHROID) 100 MCG tablet TAKE 1 TABLET EVERY DAY 04/13/14  Yes Janith Lima, MD  Multiple Vitamin (MULITIVITAMIN WITH MINERALS) TABS Take 1 tablet by mouth daily.  Yes Historical Provider, MD  pioglitazone-metformin (ACTOPLUS MET) 15-850 MG per tablet TAKE 2 TABLETS EVERY DAY WITH SUPPER 06/05/14  Yes Janith Lima, MD  pravastatin (PRAVACHOL) 40 MG tablet Take 1 tablet (40 mg total) by mouth daily. 01/23/14  Yes Janith Lima, MD  vitamin B-12 (CYANOCOBALAMIN) 1000 MCG tablet Take 2,000 mcg by mouth daily.   Yes Historical Provider, MD    Past Medical History  Diagnosis Date  . Hypertension     on meds  . Heart murmur   . Hypothyroidism     on meds  . GERD (gastroesophageal reflux disease)   . Diabetes  mellitus   . Hyperlipidemia     Past Surgical History  Procedure Laterality Date  . Hernia repair      as a child  . Dental surgery      several dental procedure approx 10 between 1999-2012  . Shoulder surgery  05/31/2011     impingment left  . Shoulder arthroscopy  05/31/2011    Procedure: ARTHROSCOPY SHOULDER;  Surgeon: Sharmon Revere, MD;  Location: [place];  Service: Orthopedics;  Laterality: Left;  Shoulder Acromioplasty Left    Family history: family history includes Cancer in his maternal grandfather; Diabetes in his father; Hyperlipidemia in his mother; Hypertension in his mother; Kidney disease in his father; Stroke in his father. There is no history of Anesthesia problems, Hypotension, Malignant hyperthermia, or Pseudochol deficiency.  Social History: Patient lives with his wife.  He quit smoking a few years ago.  He does not use a cane or walker at baseline.     Physical Exam: BP 182/71 mmHg  Pulse 62  Temp(Src) 97.5 F (36.4 C) (Oral)  Resp 20  SpO2 97% General appearance: Well-developed, adult male, alert and in no acute distress.   Eyes: Anicteric, conjunctiva pink, lids and lashes normal.     ENT: No nasal deformity, discharge, or epistaxis.  OP moist without lesions.   Skin: Warm and dry.  No suspicious rashes or lesions. Cardiac: RRR, nl S1-S2, no murmurs appreciated.  Capillary refill is brisk.  No JVD.  No LE edema.  Radial and DP pulses 2+ and symmetric. Respiratory: Normal respiratory rate and rhythm.  CTAB without rales or wheezes. Abdomen: Abdomen soft without rigidity.  No TTP. No ascites, distension.   MSK: No deformities or effusions. Neuro: There is no nystagmus, spontaneous, with lateral gaze or with eye covering.  Head impulse testing is normal.  Pupils are 3 mm and reactive to 2 mm.  Extraocular movements are intact, without nystagmus.  Cranial nerve 5 is within normal limits.  Cranial nerve 7 is symmetrical.  Cranial nerve 8 is within normal limits.   Cranial nerves 9 and 10 reveal equal palate elevation.  Cranial nerve 11 reveals sternocleidomastoid strong.  Cranial nerve 12 is midline.  Motor strength testing is 5/5 in the upper and lower extremities bilaterally with normal motor, tone and bulk. Deep tendon reflexes are 1/4.  Sensory examination is intact to light touch and position.  Romberg maneuver is negative for pathology.  Finger-to-nose testing is within normal limits and symmetric.  The patient is oriented to time, place and person.  Speech is fluent.  Naming is grossly intact.  Recall, recent and remote, as well as general fund of knowledge seem within normal limits.  Attention span and concentration are within normal limits. Psych: Behavior appropriate.  Affect normal.  No evidence of aural or visual hallucinations or delusions.  Labs on Admission:  The metabolic panel shows normal sodium, potassium, bicarbonate, and renal function. Abdomen Is normal. The glucose is 424 at admission. Urinalysis is clear with glucosuria. Lactic acid level is normal. The troponin is negative. The complete blood count shows a leukocytosis, anemia, thrombocytopenia.   Radiological Exams on Admission: Ct Head Wo Contrast 02/13/2015 [facility]  EKG: Independently reviewed. Rate 56, sinus rhythm, no ST changes. QTC 438.    Assessment/Plan 1. Hyperglycemia and NIDDM:  Endorses med adherence, dietary indiscretion.    -Hold pioglitazone and metformin -Continue home glargine tomorrow night -Sliding scale corrections in the meantime   2. Vertigo:  This is new.  This is episodic and provoked markedly by rapidly sitting back with head tilted to left, ie. [place]pike. Peripheral cause (BPPV) is suspected.  Central vertigo doubted, although patient has risk factors.  -May continue meclizine PRN -Zofran for nausea  3. HTN:  -Continue home aliskiren, statin, fibrate  4. Hypothyroidism:  -Continue home levothyroxine      DVT PPx:  Lovenox Diet: Diabetic Consultants: None Code Status: Full Family Communication: Wife, present at bedside  Medical decision making: What exists of the patient's previous chart was reviewed in depth and the case was discussed with Dr. Randal Buba. Patient seen 4:02 AM on 02/13/2015.  Disposition Plan:  I recommend admission to med surg observation status.  Clinical condition: stable.  Anticipate observation during the day given his hyperglycemia, ongoing nausea, and vertigo. Treatment of vertigo for symptomatic relief, treatment of hyperglycemia and discharge.      Edwin Dada [facility] Pager [phone]

## 2015-02-13 NOTE — Progress Notes (Signed)
VASCULAR LAB PRELIMINARY  PRELIMINARY  PRELIMINARY  PRELIMINARY  Carotid duplex completed.    Preliminary report:  1-39% ICA plaquing.  Vertebral artery flow is antegrade.   Irl Bodie, RVT 02/13/2015, 5:22 PM

## 2015-02-13 NOTE — Progress Notes (Signed)
Terry Harrington UJW:[id number] DOB: Dec 24, 1955 DOA: 02/12/2015 PCP: Sanda Linger, MD  Assessment by Dr Maryfrances Bunnell at time of admission on 02/13/15 Terry Harrington is a 60 y.o. male with a past medical history significant for IDDM, hypothyroidism and HTN who presents with vertigo for two hours.   There was initial confusion from EDP about the patient's symptoms, but he relates to me that he was in his normal state of health until today around 6PM when he stood up and had sudden onset vertigo, "feeling of falling forward" nausea, and feeling wobbly on his feet when he went to walk to the bathroom. He lay down and this went away, but when he stood up again, the symptoms returned. His wife called, and she was concerned, so she brought him to the ER.  In the ED, he was hyperglycemic. His symptoms worsened with laying down, and he would have severe nausea/sensation of spinning for a few minutes that would slowly subside. He was afebrile, hemodynamically stable. K 3.8, bicarb 22, Cr 1.0, AG normal. WBC 5.6K, Hgb 13, lactate normal, TNI negative. A CT of the head was obtained, which was normal. An ECG showed rate 56, sinus rhythm and no ST changes.  The patient denied focal weakness, confusion, slurred speech, or syncope. He had prominent nausea, episodic vertigo, positional. From chart review, he had episodes of presycnope with standing last fall related to Bystolic, for which he was seen by Neurology, but he has never had true vertigo. Summary&Daily Progress Notes since admission 02/13/15: I have seen and examined Terry Harrington at bedside and reviewed his chart. He came in with positional vertigo, question is underlying cause- sugars generally controlled till now, HbA1C 7.2 Nov last year, indiscretion the past week because of stresses related to ongoing family issues, not sure if this is enough to account for vertigo. Possibility of TIA/CVA/other intracranial abnormalities. Will therefore obtain brain  MRI/carotid Doppler/2-D echo/RPR/vitamin B12 level/urine drug screen and continue rest of management per Dr. Maryfrances Bunnell. Please refer to his H&P for details. Problem List Plan  Active Problems:   Type II diabetes mellitus with manifestations (HCC)   HTN (hypertension)   Hypothyroidism   Pernicious anemia   Vertigo   Brain MRI/Carotid Doppler/2D echo  RPR/vitamin b12 level  Urine drug screen  Code Status: Full Code Family Communication:  Disposition Plan: Home in the next couple of days Consultants:  None Procedures:   Antibiotics:  None  HPI/Subjective: Feels somewhat better.  Objective: Filed Vitals:   02/13/15 0510 02/13/15 0533  BP: 143/72 149/74  Pulse: 59 61  Temp:  97.7 F (36.5 C)  Resp: 16 16    Intake/Output Summary (Last 24 hours) at 02/13/15 0917 Last data filed at 02/13/15 0530  Gross per 24 hour  Intake      0 ml  Output    400 ml  Net   -400 ml   Filed Weights   02/13/15 0533  Weight: 93.985 kg (207 lb 3.2 oz)    Exam:   General:  Comfortable at rest.  Cardiovascular: S1-S2 normal. No murmurs. Pulse regular.  Respiratory: Good air entry bilaterally. No rhonchi or rales.  Abdomen: Soft and nontender. Normal bowel sounds. No organomegaly.  Musculoskeletal: No pedal edema   Neurological: Intact  Data Reviewed: Basic Metabolic Panel:  Recent Labs Lab 02/12/15 2207  NA 140  K 3.8  CL 107  CO2 22  GLUCOSE 424*  BUN 13  CREATININE 1.01  CALCIUM 9.5   Liver Function Tests:  No results for input(s): AST, ALT, ALKPHOS, BILITOT, PROT, ALBUMIN in the last 168 hours. No results for input(s): LIPASE, AMYLASE in the last 168 hours. No results for input(s): AMMONIA in the last 168 hours. CBC:  Recent Labs Lab 02/12/15 2207  WBC 5.6  HGB 13.1  HCT 39.0  MCV 88.0  PLT 277   Cardiac Enzymes: No results for input(s): CKTOTAL, CKMB, CKMBINDEX, TROPONINI in the last 168 hours. BNP (last 3 results) No results for input(s): BNP in  the last 8760 hours.  ProBNP (last 3 results) No results for input(s): PROBNP in the last 8760 hours.  CBG:  Recent Labs Lab 02/13/15 0120 02/13/15 0233 02/13/15 0447 02/13/15 0548 02/13/15 0746  GLUCAP 276* 284* 210* 191* 159*    No results found for this or any previous visit (from the past 240 hour(s)).   Studies: Ct Head Wo Contrast  02/13/2015  CLINICAL DATA:  Acute onset of vertigo. Dizziness, nausea and near syncope. EXAM: CT HEAD WITHOUT CONTRAST TECHNIQUE: Contiguous axial images were obtained from the base of the skull through the vertex without intravenous contrast. COMPARISON:  None. FINDINGS: No intracranial hemorrhage, mass effect, or midline shift. No hydrocephalus. The basilar cisterns are patent. No evidence of territorial infarct. No intracranial fluid collection. Calvarium is intact. Included paranasal sinuses and mastoid air cells are well aerated. IMPRESSION: No acute intracranial abnormality. Electronically Signed   By: Rubye Oaks M.D.   On: 02/13/2015 01:58    Scheduled Meds: . aliskiren  150 mg Oral Daily  . fenofibrate  160 mg Oral Daily  . insulin aspart  0-15 Units Subcutaneous TID WC  . insulin aspart  0-5 Units Subcutaneous QHS  . insulin glargine  20 Units Subcutaneous QHS  . levothyroxine  100 mcg Oral QAC breakfast  . metFORMIN  850 mg Oral BID WC  . pioglitazone  15 mg Oral Q supper  . pravastatin  40 mg Oral q1800  . vitamin B-12  2,000 mcg Oral Daily   Continuous Infusions:      Terry Harrington  [facility] Pager [phone]. If 7PM-7AM, please contact night-coverage at [email or web address], password [facility] 02/13/2015, 9:17 AM

## 2015-02-14 ENCOUNTER — Observation Stay (HOSPITAL_COMMUNITY): Payer: BLUE CROSS/BLUE SHIELD

## 2015-02-14 DIAGNOSIS — R42 Dizziness and giddiness: Secondary | ICD-10-CM | POA: Diagnosis not present

## 2015-02-14 LAB — CBC WITH DIFFERENTIAL/PLATELET
BASOS ABS: 0 10*3/uL (ref 0.0–0.1)
BASOS PCT: 0 %
EOS ABS: 0.1 10*3/uL (ref 0.0–0.7)
Eosinophils Relative: 1 %
HCT: 36.9 % — ABNORMAL LOW (ref 39.0–52.0)
HEMOGLOBIN: 12.1 g/dL — AB (ref 13.0–17.0)
Lymphocytes Relative: 56 %
Lymphs Abs: 2.6 10*3/uL (ref 0.7–4.0)
MCH: 29.1 pg (ref 26.0–34.0)
MCHC: 32.8 g/dL (ref 30.0–36.0)
MCV: 88.7 fL (ref 78.0–100.0)
Monocytes Absolute: 0.3 10*3/uL (ref 0.1–1.0)
Monocytes Relative: 6 %
NEUTROS PCT: 37 %
Neutro Abs: 1.7 10*3/uL (ref 1.7–7.7)
Platelets: 276 10*3/uL (ref 150–400)
RBC: 4.16 MIL/uL — AB (ref 4.22–5.81)
RDW: 14 % (ref 11.5–15.5)
WBC: 4.6 10*3/uL (ref 4.0–10.5)

## 2015-02-14 LAB — LIPID PANEL
CHOLESTEROL: 171 mg/dL (ref 0–200)
HDL: 50 mg/dL (ref >40)
LDL Cholesterol: 95 mg/dL (ref 0–99)
Total CHOL/HDL Ratio: 3.4 RATIO
Triglycerides: 129 mg/dL (ref <150)
VLDL: 26 mg/dL (ref 0–40)

## 2015-02-14 LAB — GLUCOSE, CAPILLARY
GLUCOSE-CAPILLARY: 212 mg/dL — AB (ref 65–99)
GLUCOSE-CAPILLARY: 153 mg/dL — AB (ref 65–99)

## 2015-02-14 LAB — COMPREHENSIVE METABOLIC PANEL
ALBUMIN: 3.4 g/dL — AB (ref 3.5–5.0)
ALK PHOS: 79 U/L (ref 38–126)
ALT: 11 U/L — ABNORMAL LOW (ref 17–63)
ANION GAP: 8 (ref 5–15)
AST: 13 U/L — ABNORMAL LOW (ref 15–41)
BILIRUBIN TOTAL: 0.3 mg/dL (ref 0.3–1.2)
BUN: 11 mg/dL (ref 6–20)
CALCIUM: 9 mg/dL (ref 8.9–10.3)
CO2: 22 mmol/L (ref 22–32)
Chloride: 108 mmol/L (ref 101–111)
Creatinine, Ser: 0.84 mg/dL (ref 0.61–1.24)
GFR calc Af Amer: >60 mL/min (ref >60)
GFR calc non Af Amer: >60 mL/min (ref >60)
GLUCOSE: 235 mg/dL — AB (ref 65–99)
Potassium: 4 mmol/L (ref 3.5–5.1)
Sodium: 138 mmol/L (ref 135–145)
TOTAL PROTEIN: 6.5 g/dL (ref 6.5–8.1)

## 2015-02-14 LAB — RPR: RPR Ser Ql: NONREACTIVE

## 2015-02-14 MED ORDER — MECLIZINE HCL 25 MG PO TABS: 25.0000 mg | ORAL_TABLET | Freq: Three times a day (TID) | ORAL | Status: DC | PRN

## 2015-02-14 NOTE — Progress Notes (Signed)
Discharged from floor ambulatory, family & belongings with pt. No changes in assessment. Terry Harrington, [employer]

## 2015-02-14 NOTE — Discharge Summary (Signed)
Terry Harrington, is a 60 y.o. male  DOB 30-Mar-1955  MRN [id number].  Admission date:  02/12/2015  Admitting Physician  Edwin Dada, MD  Discharge Date:  02/14/2015   Primary MD  Scarlette Calico, MD  Recommendations for primary care physician for things to follow:  Please arrange for 2D Echo outpatient  Admission Diagnosis   Vertigo [R42] Hyperglycemia [R73.9]   Discharge Diagnosis  Vertigo [R42] Hyperglycemia [R73.9]   Active Problems:   Type II diabetes mellitus with manifestations ([place])   HTN (hypertension)   Hypothyroidism   Pernicious anemia   Vertigo      Summary Terry Harrington is a pleasant 60 year old male with generally well controlled insulin-dependent diabetes mellitus(HbA1C 7.11 Nov 2014)/hypothyroidism/essential hypertension who presented with vertigo and was found to have hyperglycemia likely causing his symptoms. Workup was negative for infection/acute intracranial events including negative brain MRI/carotid Doppler/UA. 2-D echocardiogram pending at time of discharge and can be arranged outpatient. Patient admitted to not taking his diabetic regimen including diet as prescribed for a week or so because of family related stress. He feels that he is now at his baseline and therefore will be discharged home to resume his regular regimen and follow with his PCP in the next 1-2 weeks.  Please refer to the daily progress notes below for details of patient's hospital stay;  Hospital Course  Assessment by Dr Loleta Books at time of admission on 02/13/15 Terry Harrington is a 60 y.o. male with a past medical history significant for IDDM, hypothyroidism and HTN who presents with vertigo for two hours.   There was initial confusion from EDP about the patient's symptoms, but he relates to me that he was in his  normal state of health until today around 6PM when he stood up and had sudden onset vertigo, "feeling of falling forward" nausea, and feeling wobbly on his feet when he went to walk to the bathroom. He lay down and this went away, but when he stood up again, the symptoms returned. His wife called, and she was concerned, so she brought him to the ER.  In the ED, he was hyperglycemic. His symptoms worsened with laying down, and he would have severe nausea/sensation of spinning for a few minutes that would slowly subside. He was afebrile, hemodynamically stable. K 3.8, bicarb 22, Cr 1.0, AG normal. WBC 5.6K, Hgb 13, lactate normal, TNI negative. A CT of the head was obtained, which was normal. An ECG showed rate 56, sinus rhythm and no ST changes.  The patient denied focal weakness, confusion, slurred speech, or syncope. He had prominent nausea, episodic vertigo, positional. From chart review, he had episodes of presycnope with standing last fall related to Bystolic, for which he was seen by Neurology, but he has never had true vertigo. Summary&Daily Progress Notes since admission 02/13/15: I have seen and examined Terry Harrington at bedside and reviewed his chart. He came in with positional vertigo, question is underlying cause- sugars generally controlled till now, HbA1C 7.2 Nov last year, indiscretion  the past week because of stresses related to ongoing family issues, not sure if this is enough to account for vertigo. Possibility of TIA/CVA/other intracranial abnormalities. Will therefore obtain brain MRI/carotid Doppler/2-D echo/RPR/vitamin B12 level/urine drug screen and continue rest of management per Dr. Loleta Books. Please refer to his H&P for details. 02/14/15: Feels 100% better and is eager to go home. Sugars better controlled. Will discharge patient to follow with his PCP in the next 1-2 weeks.  Discharge Condition Stable.  Consults obtained  None  Follow UP PCP in 1-2 weeks  Discharge  Instructions  and  Discharge Medications  Discharge Instructions    Diet - low sodium heart healthy    Complete by:  As directed      Discharge instructions    Complete by:  As directed   Follow with your PCP for 2D echo     Increase activity slowly    Complete by:  As directed             Medication List    TAKE these medications        aliskiren 150 MG tablet  Commonly known as:  TEKTURNA  Take 1 tablet (150 mg total) by mouth daily.     cholecalciferol 1000 units tablet  Commonly known as:  VITAMIN D  Take 2,000 Units by mouth daily.     Fenofibric Acid 135 MG Cpdr  TAKE 1 CAPSULE EVERY DAY     insulin glargine 100 unit/mL Sopn  Commonly known as:  LANTUS  Inject 0.2 mLs (20 Units total) into the skin at bedtime.     levothyroxine 100 MCG tablet  Commonly known as:  SYNTHROID, LEVOTHROID  TAKE 1 TABLET EVERY DAY     meclizine 25 MG tablet  Commonly known as:  ANTIVERT  Take 1 tablet (25 mg total) by mouth 3 (three) times daily as needed for dizziness.     multivitamin with minerals Tabs tablet  Take 1 tablet by mouth daily.     pioglitazone-metformin 15-850 MG tablet  Commonly known as:  ACTOPLUS MET  TAKE 2 TABLETS EVERY DAY WITH SUPPER     pravastatin 40 MG tablet  Commonly known as:  PRAVACHOL  Take 1 tablet (40 mg total) by mouth daily.     vitamin B-12 1000 MCG tablet  Commonly known as:  CYANOCOBALAMIN  Take 2,000 mcg by mouth daily.        Diet and Activity recommendation: See Discharge Instructions above  Major procedures and Radiology Reports - PLEASE review detailed and final reports for all details, in brief -    Ct Head Wo Contrast  02/13/2015  CLINICAL DATA:  Acute onset of vertigo. Dizziness, nausea and near syncope. EXAM: CT HEAD WITHOUT CONTRAST TECHNIQUE: Contiguous axial images were obtained from the base of the skull through the vertex without intravenous contrast. COMPARISON:  None. FINDINGS: No intracranial hemorrhage, mass  effect, or midline shift. No hydrocephalus. The basilar cisterns are patent. No evidence of territorial infarct. No intracranial fluid collection. Calvarium is intact. Included paranasal sinuses and mastoid air cells are well aerated. IMPRESSION: No acute intracranial abnormality. Electronically Signed   By: Terry Harrington M.D.   On: 02/13/2015 01:58   Terry Brain Wo Contrast  02/13/2015  CLINICAL DATA:  60 year old male with acute onset vertigo since last night. Initial encounter. EXAM: MRI HEAD WITHOUT CONTRAST TECHNIQUE: Multiplanar, multiecho pulse sequences of the brain and surrounding structures were obtained without intravenous contrast. COMPARISON:  Head CT without contrast  0152 hours today. FINDINGS: Cerebral volume is within normal limits for age. No restricted diffusion to suggest acute infarction. No midline shift, mass effect, evidence of mass lesion, ventriculomegaly, extra-axial collection or acute intracranial hemorrhage. Cervicomedullary junction and pituitary are within normal limits. Negative visualized cervical spine. Major intracranial vascular flow voids are within normal limits. Pear[place] Cables and white matter signal is within normal limits throughout the brain. No encephalomalacia or chronic cerebral blood products. Visible internal auditory structures appear normal. Mastoids are clear. Paranasal sinuses and mastoids are clear. Negative orbit and scalp soft tissues. Normal bone marrow signal. IMPRESSION: Normal noncontrast MRI appearance of the brain. Electronically Signed   By: Terry Ann M.D.   On: 02/13/2015 12:41    Micro Results   No results found for this or any previous visit (from the past 240 hour(s)).     Today   Subjective:   Terry Harrington today has no headache,no chest abdominal pain,no new weakness tingling or numbness, feels much better wants to go home today.   Objective:   Blood pressure 137/74, pulse 58, temperature 98 F (36.7 C), temperature source Oral, resp. rate  17, height '5\' 11"'$  (1.803 m), weight 93.985 kg (207 lb 3.2 oz), SpO2 100 %.   Intake/Output Summary (Last 24 hours) at 02/14/15 1433 Last data filed at 02/14/15 1000  Gross per 24 hour  Intake    840 ml  Output      2 ml  Net    838 ml    Exam Awake Alert, Oriented x 3, No new F.N deficits, Normal affect Blanket.AT,PERRAL Supple Neck,No JVD, No cervical lymphadenopathy appriciated.  Symmetrical Chest wall movement, Good air movement bilaterally, CTAB RRR,No Gallops,Rubs or new Murmurs, No Parasternal Heave +ve B.Sounds, Abd Soft, Non tender, No organomegaly appriciated, No rebound -guarding or rigidity. No Cyanosis, Clubbing or edema, No new Rash or bruise  Data Review   CBC w Diff: Lab Results  Component Value Date   WBC 4.6 02/14/2015   HGB 12.1* 02/14/2015   HCT 36.9* 02/14/2015   PLT 276 02/14/2015   LYMPHOPCT 56 02/14/2015   MONOPCT 6 02/14/2015   EOSPCT 1 02/14/2015   BASOPCT 0 02/14/2015    CMP: Lab Results  Component Value Date   NA 138 02/14/2015   K 4.0 02/14/2015   CL 108 02/14/2015   CO2 22 02/14/2015   BUN 11 02/14/2015   CREATININE 0.84 02/14/2015   PROT 6.5 02/14/2015   ALBUMIN 3.4* 02/14/2015   BILITOT 0.3 02/14/2015   ALKPHOS 79 02/14/2015   AST 13* 02/14/2015   ALT 11* 02/14/2015  .   Total Time in preparing paper work, data evaluation and todays exam -  20 minutes  Terry Harrington M.D on 02/14/2015 at 2:33 PM  [facility] Office  [phone]

## 2015-02-18 ENCOUNTER — Other Ambulatory Visit (INDEPENDENT_AMBULATORY_CARE_PROVIDER_SITE_OTHER): Payer: BLUE CROSS/BLUE SHIELD

## 2015-02-18 ENCOUNTER — Encounter: Payer: Self-pay | Admitting: Internal Medicine

## 2015-02-18 ENCOUNTER — Ambulatory Visit (INDEPENDENT_AMBULATORY_CARE_PROVIDER_SITE_OTHER): Payer: BLUE CROSS/BLUE SHIELD | Admitting: Internal Medicine

## 2015-02-18 VITALS — BP 124/78 | HR 71 | Temp 97.6°F | Resp 16 | Ht 71.0 in | Wt 210.0 lb

## 2015-02-18 DIAGNOSIS — I10 Essential (primary) hypertension: Secondary | ICD-10-CM | POA: Diagnosis not present

## 2015-02-18 DIAGNOSIS — Z794 Long term (current) use of insulin: Secondary | ICD-10-CM

## 2015-02-18 DIAGNOSIS — E118 Type 2 diabetes mellitus with unspecified complications: Secondary | ICD-10-CM

## 2015-02-18 DIAGNOSIS — E038 Other specified hypothyroidism: Secondary | ICD-10-CM

## 2015-02-18 DIAGNOSIS — D510 Vitamin B12 deficiency anemia due to intrinsic factor deficiency: Secondary | ICD-10-CM

## 2015-02-18 DIAGNOSIS — R42 Dizziness and giddiness: Secondary | ICD-10-CM

## 2015-02-18 DIAGNOSIS — G4700 Insomnia, unspecified: Secondary | ICD-10-CM | POA: Insufficient documentation

## 2015-02-18 LAB — CBC WITH DIFFERENTIAL/PLATELET
BASOS ABS: 0 10*3/uL (ref 0.0–0.1)
Basophils Relative: 0.7 % (ref 0.0–3.0)
EOS ABS: 0.1 10*3/uL (ref 0.0–0.7)
Eosinophils Relative: 1.4 % (ref 0.0–5.0)
HEMATOCRIT: 41.4 % (ref 39.0–52.0)
Hemoglobin: 13.6 g/dL (ref 13.0–17.0)
LYMPHS PCT: 45.8 % (ref 12.0–46.0)
Lymphs Abs: 2.6 10*3/uL (ref 0.7–4.0)
MCHC: 32.8 g/dL (ref 30.0–36.0)
MCV: 88.1 fl (ref 78.0–100.0)
MONO ABS: 0.4 10*3/uL (ref 0.1–1.0)
Monocytes Relative: 6.2 % (ref 3.0–12.0)
NEUTROS ABS: 2.6 10*3/uL (ref 1.4–7.7)
Neutrophils Relative %: 45.9 % (ref 43.0–77.0)
Platelets: 306 10*3/uL (ref 150.0–400.0)
RBC: 4.7 Mil/uL (ref 4.22–5.81)
RDW: 14.5 % (ref 11.5–15.5)
WBC: 5.6 10*3/uL (ref 4.0–10.5)

## 2015-02-18 LAB — BASIC METABOLIC PANEL
BUN: 16 mg/dL (ref 6–23)
CHLORIDE: 101 meq/L (ref 96–112)
CO2: 28 mEq/L (ref 19–32)
Calcium: 10.1 mg/dL (ref 8.4–10.5)
Creatinine, Ser: 1.16 mg/dL (ref 0.40–1.50)
GFR: 82.23 mL/min (ref >60.00)
GLUCOSE: 235 mg/dL — AB (ref 70–99)
POTASSIUM: 4 meq/L (ref 3.5–5.1)
Sodium: 135 mEq/L (ref 135–145)

## 2015-02-18 LAB — MICROALBUMIN / CREATININE URINE RATIO
Creatinine,U: 162.3 mg/dL
Microalb Creat Ratio: 0.4 mg/g (ref 0.0–30.0)
Microalb, Ur: <0.7 mg/dL (ref 0.0–1.9)

## 2015-02-18 LAB — URINALYSIS, ROUTINE W REFLEX MICROSCOPIC
BILIRUBIN URINE: NEGATIVE
Hgb urine dipstick: NEGATIVE
KETONES UR: NEGATIVE
LEUKOCYTES UA: NEGATIVE
NITRITE: NEGATIVE
PH: 6 (ref 5.0–8.0)
Specific Gravity, Urine: 1.025 (ref 1.000–1.030)
TOTAL PROTEIN, URINE-UPE24: NEGATIVE
UROBILINOGEN UA: 0.2 (ref 0.0–1.0)
Urine Glucose: 250 — AB

## 2015-02-18 LAB — HEMOGLOBIN A1C: Hgb A1c MFr Bld: 12.7 % — ABNORMAL HIGH (ref 4.6–6.5)

## 2015-02-18 LAB — TSH: TSH: 1.84 u[IU]/mL (ref 0.35–4.50)

## 2015-02-18 MED ORDER — DOXEPIN HCL 6 MG PO TABS: 1.0000 | ORAL_TABLET | Freq: Every evening | ORAL | Status: DC | PRN

## 2015-02-18 MED ORDER — MECLIZINE HCL 25 MG PO TABS: 25.0000 mg | ORAL_TABLET | Freq: Three times a day (TID) | ORAL | Status: DC | PRN

## 2015-02-18 MED ORDER — INSULIN GLARGINE 100 UNIT/ML SOLOSTAR PEN: 40.0000 [IU] | PEN_INJECTOR | Freq: Every day | SUBCUTANEOUS | Status: DC

## 2015-02-18 NOTE — Patient Instructions (Signed)

## 2015-02-18 NOTE — Progress Notes (Signed)
Subjective:  Patient ID: Terry Harrington, male    DOB: [birth year]  Age: 60 y.o. MRN: [id number]  CC: Anemia; Diabetes; and Hypothyroidism   HPI Terry Harrington presents for a hospital follow-up. In the last week he developed a severe case of vertigo and was admitted. His MRI was normal. He tells me that he felt off balance and dizzy. He fortunately today tells me that his symptoms are much much better, he has gotten some relief with meclizine and wants a refill. He is also concerned about his blood sugars, his insurance will no longer cover Lantus and needs needs to change to a different basal insulin agent. He does not monitor his blood sugar at home very frequently but when he does he notices that his numbers are usually around 200 and not well controlled. Hospital discharge summary says they're concerned he might have had a TIA ask that an echocardiogram be ordered.  Outpatient Prescriptions Prior to Visit  Medication Sig Dispense Refill  . aliskiren (TEKTURNA) 150 MG tablet Take 1 tablet (150 mg total) by mouth daily. 90 tablet 1  . cholecalciferol (VITAMIN D) 1000 UNITS tablet Take 2,000 Units by mouth daily.    . Choline Fenofibrate (FENOFIBRIC ACID) 135 MG CPDR TAKE 1 CAPSULE EVERY DAY 90 capsule 1  . levothyroxine (SYNTHROID, LEVOTHROID) 100 MCG tablet TAKE 1 TABLET EVERY DAY 90 tablet 1  . Multiple Vitamin (MULITIVITAMIN WITH MINERALS) TABS Take 1 tablet by mouth daily.    . pioglitazone-metformin (ACTOPLUS MET) 15-850 MG per tablet TAKE 2 TABLETS EVERY DAY WITH SUPPER 180 tablet 1  . pravastatin (PRAVACHOL) 40 MG tablet Take 1 tablet (40 mg total) by mouth daily. 90 tablet 3  . vitamin B-12 (CYANOCOBALAMIN) 1000 MCG tablet Take 2,000 mcg by mouth daily.    . insulin glargine (LANTUS) 100 unit/mL SOPN Inject 0.2 mLs (20 Units total) into the skin at bedtime. 15 mL 11  . meclizine (ANTIVERT) 25 MG tablet Take 1 tablet (25 mg total) by mouth 3 (three) times daily as needed for  dizziness. 30 tablet 0   No facility-administered medications prior to visit.    ROS Review of Systems  Constitutional: Negative.  Negative for fever, chills, diaphoresis, appetite change and fatigue.  HENT: Negative.  Negative for sore throat and trouble swallowing.   Eyes: Negative.  Negative for photophobia and visual disturbance.  Respiratory: Negative.  Negative for cough, choking, chest tightness, shortness of breath and stridor.   Cardiovascular: Negative.  Negative for chest pain, palpitations and leg swelling.  Gastrointestinal: Negative.  Negative for nausea, vomiting, abdominal pain, diarrhea, constipation and blood in stool.  Endocrine: Negative.  Negative for polydipsia, polyphagia and polyuria.  Genitourinary: Negative.  Negative for dysuria, urgency, hematuria and decreased urine volume.  Musculoskeletal: Positive for gait problem. Negative for myalgias, back pain, joint swelling, arthralgias, neck pain and neck stiffness.  Skin: Negative.  Negative for color change and rash.  Allergic/Immunologic: Negative.   Neurological: Positive for dizziness. Negative for tremors, seizures, syncope, facial asymmetry, speech difficulty, light-headedness, numbness and headaches.  Hematological: Negative.  Negative for adenopathy. Does not bruise/bleed easily.  Psychiatric/Behavioral: Positive for sleep disturbance. Negative for suicidal ideas and dysphoric mood. The patient is not nervous/anxious.        He complains of insomnia with awakening in the middle of the night and not being able to go back to sleep. He wants a medication to treat this.    Objective:  BP 124/78 mmHg  Pulse  71  Temp(Src) 97.6 F (36.4 C) (Oral)  Resp 16  Ht '5\' 11"'$  (1.803 m)  Wt 210 lb (95.255 kg)  BMI 29.30 kg/m2  SpO2 99%  BP Readings from Last 3 Encounters:  02/18/15 124/78  02/14/15 151/76  11/16/14 138/72    Wt Readings from Last 3 Encounters:  02/18/15 210 lb (95.255 kg)  02/13/15 207 lb 3.2  oz (93.985 kg)  11/16/14 216 lb (97.977 kg)    Physical Exam  Constitutional: No distress.  HENT:  Head: Normocephalic and atraumatic.  Mouth/Throat: Oropharynx is clear and moist. No oropharyngeal exudate.  Eyes: Conjunctivae are normal. Right eye exhibits no discharge. Left eye exhibits no discharge. No scleral icterus.  Neck: Normal range of motion. Neck supple. No JVD present. No tracheal deviation present. No thyromegaly present.  Cardiovascular: Normal rate, regular rhythm and intact distal pulses.  Exam reveals no gallop and no friction rub.   No murmur heard. Pulmonary/Chest: Effort normal and breath sounds normal. No stridor. No respiratory distress. He has no wheezes. He has no rales. He exhibits no tenderness.  Abdominal: Soft. Bowel sounds are normal. He exhibits no distension and no mass. There is no tenderness. There is no rebound and no guarding.  Musculoskeletal: Normal range of motion. He exhibits no edema or tenderness.  Lymphadenopathy:    He has no cervical adenopathy.  Neurological: He is alert. He has normal strength. He displays no atrophy, no tremor and normal reflexes. No cranial nerve deficit or sensory deficit. He exhibits normal muscle tone. He displays a negative Romberg sign. He displays no seizure activity. Coordination and gait normal. He displays no Babinski's sign on the right side. He displays no Babinski's sign on the left side.  Reflex Scores:      Tricep reflexes are 1+ on the right side and 1+ on the left side.      Bicep reflexes are 1+ on the right side and 1+ on the left side.      Brachioradialis reflexes are 1+ on the right side and 1+ on the left side.      Patellar reflexes are 1+ on the right side and 1+ on the left side.      Achilles reflexes are 1+ on the right side and 1+ on the left side. Skin: Skin is warm and dry. No rash noted. He is not diaphoretic. No erythema. No pallor.  Psychiatric: He has a normal mood and affect. His behavior is  normal. Judgment and thought content normal.  Vitals reviewed.   Lab Results  Component Value Date   WBC 5.6 02/18/2015   HGB 13.6 02/18/2015   HCT 41.4 02/18/2015   PLT 306.0 02/18/2015   GLUCOSE 235* 02/18/2015   CHOL 171 02/14/2015   TRIG 129 02/14/2015   HDL 50 02/14/2015   LDLDIRECT 132.8 02/21/2013   LDLCALC 95 02/14/2015   ALT 11* 02/14/2015   AST 13* 02/14/2015   NA 135 02/18/2015   K 4.0 02/18/2015   CL 101 02/18/2015   CREATININE 1.16 02/18/2015   BUN 16 02/18/2015   CO2 28 02/18/2015   TSH 1.84 02/18/2015   PSA 0.09* 02/21/2013   HGBA1C 12.7* 02/18/2015   MICROALBUR <0.7 02/18/2015    Ct Head Wo Contrast  02/13/2015  CLINICAL DATA:  Acute onset of vertigo. Dizziness, nausea and near syncope. EXAM: CT HEAD WITHOUT CONTRAST TECHNIQUE: Contiguous axial images were obtained from the base of the skull through the vertex without intravenous contrast. COMPARISON:  None. FINDINGS:  No intracranial hemorrhage, mass effect, or midline shift. No hydrocephalus. The basilar cisterns are patent. No evidence of territorial infarct. No intracranial fluid collection. Calvarium is intact. Included paranasal sinuses and mastoid air cells are well aerated. IMPRESSION: No acute intracranial abnormality. Electronically Signed   By: Jeb Levering M.D.   On: 02/13/2015 01:58   Mr Brain Wo Contrast  02/13/2015  CLINICAL DATA:  60 year old male with acute onset vertigo since last night. Initial encounter. EXAM: MRI HEAD WITHOUT CONTRAST TECHNIQUE: Multiplanar, multiecho pulse sequences of the brain and surrounding structures were obtained without intravenous contrast. COMPARISON:  Head CT without contrast 0152 hours today. FINDINGS: Cerebral volume is within normal limits for age. No restricted diffusion to suggest acute infarction. No midline shift, mass effect, evidence of mass lesion, ventriculomegaly, extra-axial collection or acute intracranial hemorrhage. Cervicomedullary junction and  pituitary are within normal limits. Negative visualized cervical spine. Major intracranial vascular flow voids are within normal limits. Pearline Cables and white matter signal is within normal limits throughout the brain. No encephalomalacia or chronic cerebral blood products. Visible internal auditory structures appear normal. Mastoids are clear. Paranasal sinuses and mastoids are clear. Negative orbit and scalp soft tissues. Normal bone marrow signal. IMPRESSION: Normal noncontrast MRI appearance of the brain. Electronically Signed   By: Genevie Ann M.D.   On: 02/13/2015 12:41    Assessment & Plan:   Elizjah was seen today for anemia, diabetes and hypothyroidism.  Diagnoses and all orders for this visit:  Vertigo- this is improving, will continue meclizine for now, his neurologic exam is normal today, echocardiogram ordered as requested -     meclizine (ANTIVERT) 25 MG tablet; Take 1 tablet (25 mg total) by mouth 3 (three) times daily as needed for dizziness. -     ECHOCARDIOGRAM COMPLETE; Future  Essential hypertension- his blood sugars adequately well controlled, his electrolytes and renal function are stable. -     Basic metabolic panel; Future -     Urinalysis, Routine w reflex microscopic (not at [facility]); Future  Other specified hypothyroidism- his TSH is in the normal range, he will remain on the current dose of Synthroid. -     TSH; Future  Type 2 diabetes mellitus with complication, with long-term current use of insulin ([place])- he has not been compliant with his diabetic medications and his A1c is up to 12.7%, this may be causing his vertigo, I have changed his basal insulin to one that is approved on his insurance and asked him to restart his prior meds for diabetes, I'm also asking him to see endocrinology for further evaluation. -     Insulin Glargine (BASAGLAR KWIKPEN) 100 UNIT/ML Solostar Pen; Inject 40 Units into the skin daily at 10 pm. -     Microalbumin / creatinine urine ratio; Future -      Hemoglobin A1c; Future  Pernicious anemia- improvement noted -     CBC with Differential/Platelet; Future  Middle insomnia- he will try Silenor -     Doxepin HCl (SILENOR) 6 MG TABS; Take 1 tablet (6 mg total) by mouth at bedtime as needed.  I have discontinued Mr. Kinker insulin glargine. I am also having him start on Doxepin HCl and Insulin Glargine. Additionally, I am having him maintain his multivitamin with minerals, vitamin B-12, cholecalciferol, pravastatin, levothyroxine, Fenofibric Acid, pioglitazone-metformin, aliskiren, and meclizine.  Meds ordered this encounter  Medications  . Doxepin HCl (SILENOR) 6 MG TABS    Sig: Take 1 tablet (6 mg total) by mouth at  bedtime as needed.    Dispense:  30 tablet    Refill:  11  . meclizine (ANTIVERT) 25 MG tablet    Sig: Take 1 tablet (25 mg total) by mouth 3 (three) times daily as needed for dizziness.    Dispense:  30 tablet    Refill:  0  . Insulin Glargine (BASAGLAR KWIKPEN) 100 UNIT/ML Solostar Pen    Sig: Inject 40 Units into the skin daily at 10 pm.    Dispense:  15 mL    Refill:  11     Follow-up: Return in about 3 weeks (around 03/11/2015).  Scarlette Calico, MD

## 2015-02-18 NOTE — Progress Notes (Signed)
Pre visit review using our clinic review tool, if applicable. No additional management support is needed unless otherwise documented below in the visit note. 

## 2015-02-19 ENCOUNTER — Encounter: Payer: Self-pay | Admitting: Internal Medicine

## 2015-02-19 MED ORDER — PIOGLITAZONE HCL-METFORMIN HCL 15-850 MG PO TABS: ORAL_TABLET | ORAL | Status: DC

## 2015-02-23 ENCOUNTER — Encounter: Payer: Self-pay | Admitting: Endocrinology

## 2015-03-06 ENCOUNTER — Other Ambulatory Visit: Payer: Self-pay | Admitting: Internal Medicine

## 2015-03-09 ENCOUNTER — Other Ambulatory Visit: Payer: Self-pay | Admitting: Internal Medicine

## 2015-03-16 ENCOUNTER — Encounter: Payer: Self-pay | Admitting: Internal Medicine

## 2015-03-16 ENCOUNTER — Ambulatory Visit (INDEPENDENT_AMBULATORY_CARE_PROVIDER_SITE_OTHER): Payer: BLUE CROSS/BLUE SHIELD | Admitting: Internal Medicine

## 2015-03-16 VITALS — BP 146/80 | HR 84 | Temp 98.4°F | Resp 16 | Ht 71.0 in | Wt 215.0 lb

## 2015-03-16 DIAGNOSIS — E118 Type 2 diabetes mellitus with unspecified complications: Secondary | ICD-10-CM

## 2015-03-16 DIAGNOSIS — Z794 Long term (current) use of insulin: Secondary | ICD-10-CM

## 2015-03-16 DIAGNOSIS — R9431 Abnormal electrocardiogram [ECG] [EKG]: Secondary | ICD-10-CM

## 2015-03-16 DIAGNOSIS — G4700 Insomnia, unspecified: Secondary | ICD-10-CM

## 2015-03-16 LAB — GLUCOSE, POCT (MANUAL RESULT ENTRY): POC Glucose: 207 mg/dl — AB (ref 70–99)

## 2015-03-16 MED ORDER — INSULIN GLARGINE 100 UNIT/ML SOLOSTAR PEN: 40.0000 [IU] | PEN_INJECTOR | Freq: Every day | SUBCUTANEOUS | Status: DC

## 2015-03-16 MED ORDER — ESZOPICLONE 2 MG PO TABS: 2.0000 mg | ORAL_TABLET | Freq: Every evening | ORAL | Status: DC | PRN

## 2015-03-16 NOTE — Progress Notes (Signed)
Pre visit review using our clinic review tool, if applicable. No additional management support is needed unless otherwise documented below in the visit note. 

## 2015-03-16 NOTE — Patient Instructions (Signed)

## 2015-03-16 NOTE — Progress Notes (Signed)
Subjective:  Patient ID: Terry Harrington, male    DOB: 1955-03-31  Age: 60 y.o. MRN: [id number]  CC: Diabetes   HPI Terry Harrington presents for a blood sugar recheck and complaints about insomnia.  He tells me that his home his blood sugar usually runs around 100-120 so instead of taking the recommended dose of Basaglar at 40 units when I last saw him he has only been using 20 units a day. He denies any overt signs and symptoms of hyper or hypoglycemia.  He also complains of insomnia that he describes as frequent awakening. He says he will awake at about 2 in the morning and can't go to sleep for several hours. He wants to have this treated.  Outpatient Prescriptions Prior to Visit  Medication Sig Dispense Refill  . aliskiren (TEKTURNA) 150 MG tablet Take 1 tablet (150 mg total) by mouth daily. 90 tablet 1  . cholecalciferol (VITAMIN D) 1000 UNITS tablet Take 2,000 Units by mouth daily.    . Choline Fenofibrate (FENOFIBRIC ACID) 135 MG CPDR TAKE 1 CAPSULE EVERY DAY 90 capsule 1  . levothyroxine (SYNTHROID, LEVOTHROID) 100 MCG tablet Take 1 tablet (100 mcg total) by mouth daily. 90 tablet 3  . meclizine (ANTIVERT) 25 MG tablet Take 1 tablet (25 mg total) by mouth 3 (three) times daily as needed for dizziness. 30 tablet 0  . Multiple Vitamin (MULITIVITAMIN WITH MINERALS) TABS Take 1 tablet by mouth daily.    . pioglitazone-metformin (ACTOPLUS MET) 15-850 MG tablet TAKE 2 TABLETS EVERY DAY WITH SUPPER 180 tablet 1  . pravastatin (PRAVACHOL) 40 MG tablet Take 1 tablet (40 mg total) by mouth daily. 90 tablet 3  . vitamin B-12 (CYANOCOBALAMIN) 1000 MCG tablet Take 2,000 mcg by mouth daily.    . Doxepin HCl (SILENOR) 6 MG TABS Take 1 tablet (6 mg total) by mouth at bedtime as needed. 30 tablet 11  . Insulin Glargine (BASAGLAR KWIKPEN) 100 UNIT/ML Solostar Pen Inject 40 Units into the skin daily at 10 pm. (Patient taking differently: Inject 20 Units into the skin daily at 10 pm. ) 15 mL 11    No facility-administered medications prior to visit.    ROS Review of Systems  Constitutional: Negative.  Negative for appetite change, fatigue and unexpected weight change.  HENT: Negative.   Eyes: Negative.   Respiratory: Negative.  Negative for cough, choking, chest tightness, shortness of breath and stridor.   Cardiovascular: Negative.  Negative for chest pain, palpitations and leg swelling.  Gastrointestinal: Negative.  Negative for nausea, vomiting, abdominal pain, diarrhea and constipation.  Endocrine: Negative.  Negative for polydipsia, polyphagia and polyuria.  Genitourinary: Negative.  Negative for difficulty urinating.  Musculoskeletal: Negative.  Negative for myalgias, back pain, joint swelling and arthralgias.  Skin: Negative.  Negative for color change and rash.  Allergic/Immunologic: Negative.   Neurological: Negative.  Negative for dizziness, tremors, weakness, light-headedness, numbness and headaches.  Hematological: Negative.  Negative for adenopathy. Does not bruise/bleed easily.  Psychiatric/Behavioral: Positive for sleep disturbance. Negative for dysphoric mood and decreased concentration. The patient is not nervous/anxious.     Objective:  BP 146/80 mmHg  Pulse 84  Temp(Src) 98.4 F (36.9 C) (Oral)  Resp 16  Ht '5\' 11"'$  (1.803 m)  Wt 215 lb (97.523 kg)  BMI 30.00 kg/m2  SpO2 97%  BP Readings from Last 3 Encounters:  03/16/15 146/80  02/18/15 124/78  02/14/15 151/76    Wt Readings from Last 3 Encounters:  03/16/15 215 lb (  97.523 kg)  02/18/15 210 lb (95.255 kg)  02/13/15 207 lb 3.2 oz (93.985 kg)    Physical Exam  Constitutional: He is oriented to person, place, and time. No distress.  HENT:  Mouth/Throat: Oropharynx is clear and moist. No oropharyngeal exudate.  Eyes: Conjunctivae are normal. Right eye exhibits no discharge. Left eye exhibits no discharge. No scleral icterus.  Neck: Normal range of motion. Neck supple. No JVD present. No  tracheal deviation present. No thyromegaly present.  Cardiovascular: Normal rate, regular rhythm, normal heart sounds and intact distal pulses.  Exam reveals no gallop and no friction rub.   No murmur heard. Pulmonary/Chest: Effort normal and breath sounds normal. No stridor. No respiratory distress. He has no wheezes. He has no rales. He exhibits no tenderness.  Abdominal: Soft. Bowel sounds are normal. He exhibits no distension and no mass. There is no tenderness. There is no rebound and no guarding.  Musculoskeletal: Normal range of motion. He exhibits no edema or tenderness.  Lymphadenopathy:    He has no cervical adenopathy.  Neurological: He is oriented to person, place, and time.  Skin: Skin is warm and dry. No rash noted. He is not diaphoretic. No erythema. No pallor.  Psychiatric: He has a normal mood and affect. His behavior is normal. Judgment and thought content normal.  Vitals reviewed.   Lab Results  Component Value Date   WBC 5.6 02/18/2015   HGB 13.6 02/18/2015   HCT 41.4 02/18/2015   PLT 306.0 02/18/2015   GLUCOSE 235* 02/18/2015   CHOL 171 02/14/2015   TRIG 129 02/14/2015   HDL 50 02/14/2015   LDLDIRECT 132.8 02/21/2013   LDLCALC 95 02/14/2015   ALT 11* 02/14/2015   AST 13* 02/14/2015   NA 135 02/18/2015   K 4.0 02/18/2015   CL 101 02/18/2015   CREATININE 1.16 02/18/2015   BUN 16 02/18/2015   CO2 28 02/18/2015   TSH 1.84 02/18/2015   PSA 0.09* 02/21/2013   HGBA1C 12.7* 02/18/2015   MICROALBUR <0.7 02/18/2015    Ct Head Wo Contrast  02/13/2015  CLINICAL DATA:  Acute onset of vertigo. Dizziness, nausea and near syncope. EXAM: CT HEAD WITHOUT CONTRAST TECHNIQUE: Contiguous axial images were obtained from the base of the skull through the vertex without intravenous contrast. COMPARISON:  None. FINDINGS: No intracranial hemorrhage, mass effect, or midline shift. No hydrocephalus. The basilar cisterns are patent. No evidence of territorial infarct. No intracranial  fluid collection. Calvarium is intact. Included paranasal sinuses and mastoid air cells are well aerated. IMPRESSION: No acute intracranial abnormality. Electronically Signed   By: Jeb Levering M.D.   On: 02/13/2015 01:58   Mr Brain Wo Contrast  02/13/2015  CLINICAL DATA:  60 year old male with acute onset vertigo since last night. Initial encounter. EXAM: MRI HEAD WITHOUT CONTRAST TECHNIQUE: Multiplanar, multiecho pulse sequences of the brain and surrounding structures were obtained without intravenous contrast. COMPARISON:  Head CT without contrast 0152 hours today. FINDINGS: Cerebral volume is within normal limits for age. No restricted diffusion to suggest acute infarction. No midline shift, mass effect, evidence of mass lesion, ventriculomegaly, extra-axial collection or acute intracranial hemorrhage. Cervicomedullary junction and pituitary are within normal limits. Negative visualized cervical spine. Major intracranial vascular flow voids are within normal limits. Pearline Cables and white matter signal is within normal limits throughout the brain. No encephalomalacia or chronic cerebral blood products. Visible internal auditory structures appear normal. Mastoids are clear. Paranasal sinuses and mastoids are clear. Negative orbit and scalp soft tissues. Normal  bone marrow signal. IMPRESSION: Normal noncontrast MRI appearance of the brain. Electronically Signed   By: Genevie Ann M.D.   On: 02/13/2015 12:41    Assessment & Plan:   Terry Harrington was seen today for diabetes.  Diagnoses and all orders for this visit:  Middle insomnia- he will treat the insomnia with Lunesta. -     eszopiclone (LUNESTA) 2 MG TABS tablet; Take 1 tablet (2 mg total) by mouth at bedtime as needed for sleep. Take immediately before bedtime  Type 2 diabetes mellitus with complication, with long-term current use of insulin ([place])- his blood sugars are not adequately well controlled, I have asked him to increase the basic lower from 20 units a  day back up to 40 units a day, he will also be seeing endocrinology soon. -     POCT Glucose (CBG) -     Insulin Glargine (LANTUS) 100 UNIT/ML Solostar Pen; Inject 40 Units into the skin daily at 10 pm.   I have discontinued Terry Harrington Doxepin HCl. I have also changed his Insulin Glargine. Additionally, I am having him start on eszopiclone. Lastly, I am having him maintain his multivitamin with minerals, vitamin B-12, cholecalciferol, pravastatin, aliskiren, meclizine, pioglitazone-metformin, levothyroxine, and Fenofibric Acid.  Meds ordered this encounter  Medications  . Insulin Glargine (LANTUS) 100 UNIT/ML Solostar Pen    Sig: Inject 40 Units into the skin daily at 10 pm.    Dispense:  15 mL    Refill:  11  . eszopiclone (LUNESTA) 2 MG TABS tablet    Sig: Take 1 tablet (2 mg total) by mouth at bedtime as needed for sleep. Take immediately before bedtime    Dispense:  30 tablet    Refill:  5     Follow-up: Return in about 3 months (around 06/16/2015).  Scarlette Calico, MD

## 2015-03-22 ENCOUNTER — Other Ambulatory Visit (INDEPENDENT_AMBULATORY_CARE_PROVIDER_SITE_OTHER): Payer: BLUE CROSS/BLUE SHIELD | Admitting: *Deleted

## 2015-03-22 ENCOUNTER — Encounter: Payer: Self-pay | Admitting: Endocrinology

## 2015-03-22 ENCOUNTER — Ambulatory Visit (INDEPENDENT_AMBULATORY_CARE_PROVIDER_SITE_OTHER): Payer: BLUE CROSS/BLUE SHIELD | Admitting: Endocrinology

## 2015-03-22 VITALS — BP 122/60 | HR 80 | Temp 97.6°F | Resp 12 | Ht 71.0 in | Wt 215.0 lb

## 2015-03-22 DIAGNOSIS — E118 Type 2 diabetes mellitus with unspecified complications: Secondary | ICD-10-CM

## 2015-03-22 DIAGNOSIS — Z794 Long term (current) use of insulin: Secondary | ICD-10-CM

## 2015-03-22 LAB — POCT GLYCOSYLATED HEMOGLOBIN (HGB A1C): Hemoglobin A1C: 10

## 2015-03-22 MED ORDER — BASAGLAR KWIKPEN 100 UNIT/ML ~~LOC~~ SOPN: 40.0000 [IU] | PEN_INJECTOR | SUBCUTANEOUS | Status: DC

## 2015-03-22 NOTE — Progress Notes (Signed)
Subjective:    Patient ID: Terry Harrington, male    DOB: [birth year], 60 y.o.   MRN: [id number]  HPI pt states DM was dx'ed in 2010; he has mild if any neuropathy of the lower extremities; he is unaware of any associated chronic complications; he has been on insulin since 2015; pt says his diet id good, but exercise is poor; he has never had pancreatitis, severe hypoglycemia or DKA.  He was recently hospitalized for nonketotic hyperosmolar hyperglycemic state.  Pt says this was due to missing insulin doses.  Since hosp d/c, he says he never misses the insulin, and cbg's are in the low to mid-100's.  It is in general higher as the day goes on. He works M-F, day shift.  He says cbg's are higher on weekends.   Past Medical History  Diagnosis Date  . Hypertension     on meds  . Heart murmur   . Hypothyroidism     on meds  . GERD (gastroesophageal reflux disease)   . Diabetes mellitus   . Hyperlipidemia     Past Surgical History  Procedure Laterality Date  . Hernia repair      as a child  . Dental surgery      several dental procedure approx 10 between 1999-2012  . Shoulder surgery  05/31/2011     impingment left  . Shoulder arthroscopy  05/31/2011    Procedure: ARTHROSCOPY SHOULDER;  Surgeon: Sharmon Revere, MD;  Location: [facility];  Service: Orthopedics;  Laterality: Left;  Shoulder Acromioplasty Left    Social History   Social History  . Marital Status: Divorced    Spouse Name: N/A  . Number of Children: N/A  . Years of Education: N/A   Occupational History  . Construction    Social History Main Topics  . Smoking status: Former Smoker -- 1.50 packs/day for 26 years    Types: Cigarettes    Quit date: 03/02/2008  . Smokeless tobacco: Former Systems developer    Quit date: 05/25/2008  . Alcohol Use: 0.0 oz/week    0 Standard drinks or equivalent per week     Comment: rarely  . Drug Use: No  . Sexual Activity: Not Currently   Other Topics Concern  . Not on file   Social History  Narrative    Current Outpatient Prescriptions on File Prior to Visit  Medication Sig Dispense Refill  . aliskiren (TEKTURNA) 150 MG tablet Take 1 tablet (150 mg total) by mouth daily. 90 tablet 1  . cholecalciferol (VITAMIN D) 1000 UNITS tablet Take 2,000 Units by mouth daily.    . Choline Fenofibrate (FENOFIBRIC ACID) 135 MG CPDR TAKE 1 CAPSULE EVERY DAY 90 capsule 1  . eszopiclone (LUNESTA) 2 MG TABS tablet Take 1 tablet (2 mg total) by mouth at bedtime as needed for sleep. Take immediately before bedtime 30 tablet 5  . levothyroxine (SYNTHROID, LEVOTHROID) 100 MCG tablet Take 1 tablet (100 mcg total) by mouth daily. 90 tablet 3  . meclizine (ANTIVERT) 25 MG tablet Take 1 tablet (25 mg total) by mouth 3 (three) times daily as needed for dizziness. 30 tablet 0  . Multiple Vitamin (MULITIVITAMIN WITH MINERALS) TABS Take 1 tablet by mouth daily.    . pioglitazone-metformin (ACTOPLUS MET) 15-850 MG tablet TAKE 2 TABLETS EVERY DAY WITH SUPPER 180 tablet 1  . pravastatin (PRAVACHOL) 40 MG tablet Take 1 tablet (40 mg total) by mouth daily. 90 tablet 3  . vitamin B-12 (CYANOCOBALAMIN) 1000 MCG  tablet Take 2,000 mcg by mouth daily.     No current facility-administered medications on file prior to visit.    Allergies  Allergen Reactions  . Crab [Shellfish Allergy] Anaphylaxis  . Iodine Anaphylaxis  . Lisinopril     cough  . Crestor [Rosuvastatin]     Muscle aches  . Iohexol Swelling  . Peanut-Containing Drug Products Itching    And whelps  . Aspirin Rash  . Codeine Rash  . Penicillins Rash    Has patient had a PCN reaction causing immediate rash, facial/tongue/throat swelling, SOB or lightheadedness with hypotension: Yes Has patient had a PCN reaction causing severe rash involving mucus membranes or skin necrosis: Yes Has patient had a PCN reaction that required hospitalization No Has patient had a PCN reaction occurring within the last 10 years: No If all of the above answers are "NO",  then may proceed with Cephalosporin use.     Family History  Problem Relation Age of Onset  . Anesthesia problems Neg Hx   . Hypotension Neg Hx   . Malignant hyperthermia Neg Hx   . Pseudochol deficiency Neg Hx   . Hyperlipidemia Mother   . Hypertension Mother   . Stroke Father   . Kidney disease Father   . Diabetes Father   . Cancer Maternal Grandfather     BP 122/60 mmHg  Pulse 80  Temp(Src) 97.6 F (36.4 C) (Oral)  Resp 12  Ht '5\' 11"'$  (1.803 m)  Wt 215 lb (97.523 kg)  BMI 30.00 kg/m2  SpO2 97%  Review of Systems denies blurry vision, headache, chest pain, sob, n/v, urinary frequency, muscle cramps, excessive diaphoresis,  memory loss, cold intolerance, rhinorrhea, and easy bruising.  He has weight gain.     Objective:   Physical Exam VS: see vs page GEN: no distress HEAD: head: no deformity eyes: no periorbital swelling, no proptosis external nose and ears are normal mouth: no lesion seen NECK: supple, thyroid is not enlarged CHEST WALL: no deformity LUNGS: clear to auscultation BREASTS:  No gynecomastia CV: reg rate and rhythm, no murmur ABD: abdomen is soft, nontender.  no hepatosplenomegaly.  not distended.  Large self-reducing ventral hernia MUSCULOSKELETAL: muscle bulk and strength are grossly normal.  no obvious joint swelling.  gait is normal and steady EXTEMITIES: no deformity.  no ulcer on the feet.  feet are of normal color and temp.  Trace bilat leg edema PULSES: dorsalis pedis intact bilat.  no carotid bruit NEURO:  cn 2-12 grossly intact.   readily moves all 4's.  sensation is intact to touch on the feet, but the skin is dry.  SKIN:  Normal texture and temperature.  No rash or suspicious lesion is visible.   NODES:  None palpable at the neck.   PSYCH: alert, well-oriented.  Does not appear anxious nor depressed.    Lab Results  Component Value Date   HGBA1C 12.7* 02/18/2015   i personally reviewed electrocardiogram tracing  (02/12/15): Indication: DM.  Impression: normal.     Assessment & Plan:  DM: severe exacerbation Noncompliance by hx: QD insulin is best, at least for now.   Edema: we discussed this is caused or exac by actos + met.  Pt chooses to continue.  Occupational status: Please continue the same insulin each day of the week for now, but in the future, we may need to adjust by day of the week.    Patient is advised the following: Patient Instructions  good diet and exercise significantly  improve the control of your diabetes.  please let me know if you wish to be referred to a dietician.  high blood sugar is very risky to your health.  you should see an eye doctor and dentist every year.  It is very important to get all recommended vaccinations.  controlling your blood pressure and cholesterol drastically reduces the damage diabetes does to your body.  Those who smoke should quit.  please discuss these with your doctor.  check your blood sugar twice a day.  vary the time of day when you check, between before the 3 meals, and at bedtime.  also check if you have symptoms of your blood sugar being too high or too low.  please keep a record of the readings and bring it to your next appointment here (or you can bring the meter itself).  You can write it on any piece of paper.  please call us sooner if your blood sugar goes below 70, or if you have a lot of readings over 200.  At our office, we are fortunate to have two specialists who are happy to help you:   Leonia Reader, RN, CDE, is a diabetes educator and pump trainer.  She is here on Monday mornings, and all day Tuesday and Wednesday.  She is can help you with low blood sugar avoidance and treatment, injecting insulin, sick day management, and others.   Antonieta Iba, RD is our dietician.  She is here all day Thursday and Friday.  She can advise you about a healthy diet.  She can also help you about a variety of special diabetes situations, such as shift work,  Actor, gluten-free, diet for kidney patients, and traveling with diabetes.  The once a day insulin works better in the morning.  i have sent a prescription to your pharmacy, for the generic. blood tests are requested for you today.  We'll let you know about the results. Please come back for a follow-up appointment in 4 months.

## 2015-03-22 NOTE — Patient Instructions (Addendum)
good diet and exercise significantly improve the control of your diabetes.  please let me know if you wish to be referred to a dietician.  high blood sugar is very risky to your health.  you should see an eye doctor and dentist every year.  It is very important to get all recommended vaccinations.  controlling your blood pressure and cholesterol drastically reduces the damage diabetes does to your body.  Those who smoke should quit.  please discuss these with your doctor.  check your blood sugar twice a day.  vary the time of day when you check, between before the 3 meals, and at bedtime.  also check if you have symptoms of your blood sugar being too high or too low.  please keep a record of the readings and bring it to your next appointment here (or you can bring the meter itself).  You can write it on any piece of paper.  please call us sooner if your blood sugar goes below 70, or if you have a lot of readings over 200.  At our office, we are fortunate to have two specialists who are happy to help you:   Cristy FolksLinda Spagnola, RN, CDE, is a diabetes educator and pump trainer.  She is here on Monday mornings, and all day Tuesday and Wednesday.  She is can help you with low blood sugar avoidance and treatment, injecting insulin, sick day management, and others.   Oran ReinLaura Jobe, RD is our dietician.  She is here all day Thursday and Friday.  She can advise you about a healthy diet.  She can also help you about a variety of special diabetes situations, such as shift work, Animal nutritionistvegeterian diet, gluten-free, diet for kidney patients, and traveling with diabetes.  The once a day insulin works better in the morning.  i have sent a prescription to your pharmacy, for the generic. blood tests are requested for you today.  We'll let you know about the results. Please come back for a follow-up appointment in 4 months.

## 2015-03-23 LAB — FRUCTOSAMINE: Fructosamine: 311 umol/L — ABNORMAL HIGH (ref 0–285)

## 2015-03-25 ENCOUNTER — Ambulatory Visit (HOSPITAL_COMMUNITY): Payer: BLUE CROSS/BLUE SHIELD | Attending: Cardiology

## 2015-03-25 ENCOUNTER — Other Ambulatory Visit: Payer: Self-pay

## 2015-03-25 DIAGNOSIS — Z8249 Family history of ischemic heart disease and other diseases of the circulatory system: Secondary | ICD-10-CM | POA: Insufficient documentation

## 2015-03-25 DIAGNOSIS — I119 Hypertensive heart disease without heart failure: Secondary | ICD-10-CM | POA: Insufficient documentation

## 2015-03-25 DIAGNOSIS — E119 Type 2 diabetes mellitus without complications: Secondary | ICD-10-CM | POA: Insufficient documentation

## 2015-03-25 DIAGNOSIS — R42 Dizziness and giddiness: Secondary | ICD-10-CM

## 2015-03-25 DIAGNOSIS — E785 Hyperlipidemia, unspecified: Secondary | ICD-10-CM | POA: Insufficient documentation

## 2015-03-25 DIAGNOSIS — Z87891 Personal history of nicotine dependence: Secondary | ICD-10-CM | POA: Diagnosis not present

## 2015-03-25 DIAGNOSIS — G459 Transient cerebral ischemic attack, unspecified: Secondary | ICD-10-CM | POA: Diagnosis present

## 2015-04-03 ENCOUNTER — Encounter: Payer: Self-pay | Admitting: Family Medicine

## 2015-04-03 ENCOUNTER — Ambulatory Visit (INDEPENDENT_AMBULATORY_CARE_PROVIDER_SITE_OTHER): Payer: BLUE CROSS/BLUE SHIELD | Admitting: Family Medicine

## 2015-04-03 VITALS — BP 126/74 | HR 69 | Temp 98.0°F | Wt 218.0 lb

## 2015-04-03 DIAGNOSIS — M533 Sacrococcygeal disorders, not elsewhere classified: Secondary | ICD-10-CM

## 2015-04-03 MED ORDER — PREDNISONE 20 MG PO TABS: ORAL_TABLET | ORAL | Status: DC

## 2015-04-03 NOTE — Progress Notes (Signed)
OFFICE VISIT  04/04/2015   CC:  Chief Complaint  Patient presents with  . Hip Pain    right side--with radiation to left side--x 3-4 months--pt reports he used an exercise bike on Monday and pain increased---now pt reports he is having pain from under scrotum w/ radiation to rectum and describes the pain as intermittent sharp pain---pt has not taken any OTC oral meds     HPI:    Patient is a 60 y.o. African-American male who presents for 3-4 mo of pain in coccyx region when sitting.  Then, he recently had increased pain from coccyx into L groin after sitting on exercise bike for 30 min.  This pain was intense and radiated from coccyx under anal area into the scrotum.  It lasted about 5 sec's.  Has not recurred.  Still with bothersome coccyx pain while sitting.  No hx of trauma/injury to the area.  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  The pain does not radiate down his legs or up his back or into glut's/hips.  Past Medical History  Diagnosis Date  . Hypertension     on meds  . Heart murmur   . Hypothyroidism     on meds  . GERD (gastroesophageal reflux disease)   . Diabetes mellitus   . Hyperlipidemia     Past Surgical History  Procedure Laterality Date  . Hernia repair      as a child  . Dental surgery      several dental procedure approx 10 between 1999-2012  . Shoulder surgery  05/31/2011     impingment left  . Shoulder arthroscopy  05/31/2011    Procedure: ARTHROSCOPY SHOULDER;  Surgeon: Sharmon Revere, MD;  Location: [facility];  Service: Orthopedics;  Laterality: Left;  Shoulder Acromioplasty Left    Outpatient Prescriptions Prior to Visit  Medication Sig Dispense Refill  . aliskiren (TEKTURNA) 150 MG tablet Take 1 tablet (150 mg total) by mouth daily. 90 tablet 1  . cholecalciferol (VITAMIN D) 1000 UNITS tablet Take 2,000 Units by mouth daily.    . Choline Fenofibrate (FENOFIBRIC ACID) 135 MG CPDR TAKE 1 CAPSULE EVERY DAY 90 capsule  1  . eszopiclone (LUNESTA) 2 MG TABS tablet Take 1 tablet (2 mg total) by mouth at bedtime as needed for sleep. Take immediately before bedtime 30 tablet 5  . Insulin Glargine (BASAGLAR KWIKPEN) 100 UNIT/ML SOPN Inject 0.4 mLs (40 Units total) into the skin every morning. And pen needles 1/day 15 pen 3  . levothyroxine (SYNTHROID, LEVOTHROID) 100 MCG tablet Take 1 tablet (100 mcg total) by mouth daily. 90 tablet 3  . meclizine (ANTIVERT) 25 MG tablet Take 1 tablet (25 mg total) by mouth 3 (three) times daily as needed for dizziness. 30 tablet 0  . Multiple Vitamin (MULITIVITAMIN WITH MINERALS) TABS Take 1 tablet by mouth daily.    . pioglitazone-metformin (ACTOPLUS MET) 15-850 MG tablet TAKE 2 TABLETS EVERY DAY WITH SUPPER 180 tablet 1  . pravastatin (PRAVACHOL) 40 MG tablet Take 1 tablet (40 mg total) by mouth daily. 90 tablet 3  . vitamin B-12 (CYANOCOBALAMIN) 1000 MCG tablet Take 2,000 mcg by mouth daily.     No facility-administered medications prior to visit.    Allergies  Allergen Reactions  . Crab [Shellfish Allergy] Anaphylaxis  . Iodine Anaphylaxis  . Lisinopril     cough  . Crestor [Rosuvastatin]     Muscle aches  . Iohexol Swelling  . Peanut-Containing  Drug Products Itching    And whelps  . Aspirin Rash  . Codeine Rash  . Penicillins Rash    Has patient had a PCN reaction causing immediate rash, facial/tongue/throat swelling, SOB or lightheadedness with hypotension: Yes Has patient had a PCN reaction causing severe rash involving mucus membranes or skin necrosis: Yes Has patient had a PCN reaction that required hospitalization No Has patient had a PCN reaction occurring within the last 10 years: No If all of the above answers are "NO", then may proceed with Cephalosporin use.     ROS As per HPI  PE: Blood pressure 126/74, pulse 69, temperature 98 F (36.7 C), temperature source Oral, weight 218 lb (98.884 kg), SpO2 98 %. Gen: Alert, well appearing.  Patient is  oriented to person, place, time, and situation. Tender directly over coccyx but nowhere else.  This tenderness is mild.  LABS:  none  IMPRESSION AND PLAN:  Coccydynia: discussed use of a donut or similar type cushion with a wedge cut out. Has allergy to aspirin, so will avoid NSAIDs. Will do low dose prednisone: 69m qd x 5d, then 182mqd x 6d. Avoid exercise bike until this resolves.  An After Visit Summary was printed and given to the patient.  FOLLOW UP: Return if symptoms worsen or fail to improve.  Signed:  PhCrissie SicklesMD           04/04/2015

## 2015-04-04 ENCOUNTER — Other Ambulatory Visit: Payer: Self-pay | Admitting: Internal Medicine

## 2015-04-10 ENCOUNTER — Other Ambulatory Visit: Payer: Self-pay | Admitting: *Deleted

## 2015-04-10 MED ORDER — PREDNISONE 20 MG PO TABS: ORAL_TABLET | ORAL | Status: DC

## 2015-04-18 ENCOUNTER — Other Ambulatory Visit: Payer: Self-pay | Admitting: Internal Medicine

## 2015-06-16 ENCOUNTER — Ambulatory Visit (INDEPENDENT_AMBULATORY_CARE_PROVIDER_SITE_OTHER): Payer: BLUE CROSS/BLUE SHIELD | Admitting: Internal Medicine

## 2015-06-16 ENCOUNTER — Other Ambulatory Visit (INDEPENDENT_AMBULATORY_CARE_PROVIDER_SITE_OTHER): Payer: BLUE CROSS/BLUE SHIELD

## 2015-06-16 ENCOUNTER — Encounter: Payer: Self-pay | Admitting: Internal Medicine

## 2015-06-16 VITALS — BP 140/80 | HR 62 | Temp 98.0°F | Resp 16 | Ht 71.0 in | Wt 220.0 lb

## 2015-06-16 DIAGNOSIS — E118 Type 2 diabetes mellitus with unspecified complications: Secondary | ICD-10-CM

## 2015-06-16 DIAGNOSIS — I10 Essential (primary) hypertension: Secondary | ICD-10-CM

## 2015-06-16 DIAGNOSIS — E038 Other specified hypothyroidism: Secondary | ICD-10-CM

## 2015-06-16 DIAGNOSIS — M533 Sacrococcygeal disorders, not elsewhere classified: Secondary | ICD-10-CM

## 2015-06-16 DIAGNOSIS — Z794 Long term (current) use of insulin: Secondary | ICD-10-CM

## 2015-06-16 LAB — COMPREHENSIVE METABOLIC PANEL
ALK PHOS: 79 U/L (ref 39–117)
ALT: 11 U/L (ref 0–53)
AST: 12 U/L (ref 0–37)
Albumin: 4.4 g/dL (ref 3.5–5.2)
BILIRUBIN TOTAL: 0.2 mg/dL (ref 0.2–1.2)
BUN: 21 mg/dL (ref 6–23)
CALCIUM: 9.9 mg/dL (ref 8.4–10.5)
CO2: 27 mEq/L (ref 19–32)
Chloride: 105 mEq/L (ref 96–112)
Creatinine, Ser: 1.32 mg/dL (ref 0.40–1.50)
GFR: 70.76 mL/min (ref >60.00)
GLUCOSE: 116 mg/dL — AB (ref 70–99)
POTASSIUM: 4.2 meq/L (ref 3.5–5.1)
Sodium: 138 mEq/L (ref 135–145)
TOTAL PROTEIN: 7.6 g/dL (ref 6.0–8.3)

## 2015-06-16 LAB — TSH: TSH: 3.08 u[IU]/mL (ref 0.35–4.50)

## 2015-06-16 LAB — HEMOGLOBIN A1C: HEMOGLOBIN A1C: 6.5 % (ref 4.6–6.5)

## 2015-06-16 NOTE — Progress Notes (Signed)
Subjective:  Patient ID: Terry Harrington, male    DOB: Mar 27, 1955  Age: 60 y.o. MRN: [id number]  CC: Tailbone Pain; Hypertension; Hypothyroidism; and Diabetes   HPI Terry Harrington presents for a 3 month history of tailbone pain. He initially injured it riding a stationary bike. He was seen here about 6 weeks ago and tried a course of systemic steroids which helped some. He also takes ibuprofen which helps with the discomfort. He has not noticed any bruising or swelling and he has no pain that radiates into his buttocks or lower extremities. He denies numbness/weakness/tingling in his lower extremities.  Outpatient Prescriptions Prior to Visit  Medication Sig Dispense Refill  . aliskiren (TEKTURNA) 150 MG tablet Take 1 tablet (150 mg total) by mouth daily. 90 tablet 1  . cholecalciferol (VITAMIN D) 1000 UNITS tablet Take 2,000 Units by mouth daily.    . Choline Fenofibrate (FENOFIBRIC ACID) 135 MG CPDR TAKE 1 CAPSULE EVERY DAY 90 capsule 1  . eszopiclone (LUNESTA) 2 MG TABS tablet Take 1 tablet (2 mg total) by mouth at bedtime as needed for sleep. Take immediately before bedtime 30 tablet 5  . Insulin Glargine (BASAGLAR KWIKPEN) 100 UNIT/ML SOPN Inject 0.4 mLs (40 Units total) into the skin every morning. And pen needles 1/day 15 pen 3  . levothyroxine (SYNTHROID, LEVOTHROID) 100 MCG tablet Take 1 tablet (100 mcg total) by mouth daily. 90 tablet 3  . meclizine (ANTIVERT) 25 MG tablet Take 1 tablet (25 mg total) by mouth 3 (three) times daily as needed for dizziness. 30 tablet 0  . Multiple Vitamin (MULITIVITAMIN WITH MINERALS) TABS Take 1 tablet by mouth daily.    . pioglitazone-metformin (ACTOPLUS MET) 15-850 MG tablet TAKE 2 TABLETS EVERY DAY WITH SUPPER 180 tablet 1  . pravastatin (PRAVACHOL) 40 MG tablet Take 1 tablet (40 mg total) by mouth daily. 90 tablet 3  . TEKTURNA 150 MG tablet TAKE 1 TABLET EVERY DAY 90 tablet 1  . vitamin B-12 (CYANOCOBALAMIN) 1000 MCG tablet Take 2,000 mcg  by mouth daily.    . predniSONE (DELTASONE) 20 MG tablet 1 tab po qd x 5d, then 1/2 tab po qd x 6d 8 tablet 0   No facility-administered medications prior to visit.    ROS Review of Systems  Constitutional: Negative.  Negative for fever, chills, diaphoresis, appetite change and fatigue.  HENT: Negative.   Eyes: Negative.   Respiratory: Negative.  Negative for cough, choking, chest tightness, shortness of breath and stridor.   Cardiovascular: Negative.  Negative for chest pain, palpitations and leg swelling.  Gastrointestinal: Negative.   Endocrine: Negative.  Negative for polydipsia, polyphagia and polyuria.  Genitourinary: Negative.  Negative for dysuria, urgency, frequency, hematuria, flank pain, scrotal swelling, difficulty urinating and testicular pain.  Musculoskeletal: Positive for back pain. Negative for myalgias, joint swelling, arthralgias and gait problem.  Skin: Negative.  Negative for color change and rash.  Allergic/Immunologic: Negative.   Neurological: Negative.  Negative for dizziness, tremors, weakness, light-headedness and numbness.  Hematological: Negative.  Negative for adenopathy. Does not bruise/bleed easily.  Psychiatric/Behavioral: Negative.     Objective:  BP 140/80 mmHg  Pulse 62  Temp(Src) 98 F (36.7 C) (Oral)  Resp 16  Ht _0  (1.803 m)  Wt 220 lb (99.791 kg)  BMI 30.70 kg/m2  SpO2 98%  BP Readings from Last 3 Encounters:  06/16/15 140/80  04/03/15 126/74  03/22/15 122/60    Wt Readings from Last 3 Encounters:  06/16/15 220 lb (  99.791 kg)  04/03/15 218 lb (98.884 kg)  03/22/15 215 lb (97.523 kg)    Physical Exam  Constitutional: He is oriented to person, place, and time. No distress.  HENT:  Mouth/Throat: Oropharynx is clear and moist. No oropharyngeal exudate.  Eyes: Conjunctivae are normal. Right eye exhibits no discharge. Left eye exhibits no discharge. No scleral icterus.  Neck: Normal range of motion. Neck supple. No JVD present.  No tracheal deviation present. No thyromegaly present.  Cardiovascular: Normal rate, regular rhythm, normal heart sounds and intact distal pulses.  Exam reveals no gallop and no friction rub.   No murmur heard. Pulmonary/Chest: Effort normal and breath sounds normal. No stridor. No respiratory distress. He has no wheezes. He has no rales. He exhibits no tenderness.  Abdominal: Soft. Bowel sounds are normal. He exhibits no distension and no mass. There is no tenderness. There is no rebound and no guarding. Hernia confirmed negative in the right inguinal area and confirmed negative in the left inguinal area.  Genitourinary: Rectum normal, prostate normal and penis normal. Rectal exam shows no external hemorrhoid, no internal hemorrhoid, no fissure, no mass, no tenderness and anal tone normal. Guaiac negative stool. Prostate is not enlarged and not tender. Right testis shows no mass, no swelling and no tenderness. Right testis is descended. Left testis shows no swelling and no tenderness. Left testis is descended. Circumcised. No penile erythema or penile tenderness. No discharge found.  Musculoskeletal: Normal range of motion. He exhibits no edema.       Lumbar back: Normal. He exhibits normal range of motion, no tenderness, no bony tenderness, no swelling, no edema, no deformity, no laceration, no pain, no spasm and normal pulse.  Lymphadenopathy:    He has no cervical adenopathy.       Right: No inguinal adenopathy present.       Left: No inguinal adenopathy present.  Neurological: He is oriented to person, place, and time.  Neg SLR in BLE  Skin: Skin is warm and dry. No rash noted. He is not diaphoretic. No erythema. No pallor.  Vitals reviewed.   Lab Results  Component Value Date   WBC 5.6 02/18/2015   HGB 13.6 02/18/2015   HCT 41.4 02/18/2015   PLT 306.0 02/18/2015   GLUCOSE 116* 06/16/2015   CHOL 171 02/14/2015   TRIG 129 02/14/2015   HDL 50 02/14/2015   LDLDIRECT 132.8 02/21/2013    LDLCALC 95 02/14/2015   ALT 11 06/16/2015   AST 12 06/16/2015   NA 138 06/16/2015   K 4.2 06/16/2015   CL 105 06/16/2015   CREATININE 1.32 06/16/2015   BUN 21 06/16/2015   CO2 27 06/16/2015   TSH 3.08 06/16/2015   PSA 0.09* 02/21/2013   HGBA1C 6.5 06/16/2015   MICROALBUR <0.7 02/18/2015    Ct Head Wo Contrast  02/13/2015  CLINICAL DATA:  Acute onset of vertigo. Dizziness, nausea and near syncope. EXAM: CT HEAD WITHOUT CONTRAST TECHNIQUE: Contiguous axial images were obtained from the base of the skull through the vertex without intravenous contrast. COMPARISON:  None. FINDINGS: No intracranial hemorrhage, mass effect, or midline shift. No hydrocephalus. The basilar cisterns are patent. No evidence of territorial infarct. No intracranial fluid collection. Calvarium is intact. Included paranasal sinuses and mastoid air cells are well aerated. IMPRESSION: No acute intracranial abnormality. Electronically Signed   By: Jeb Levering M.D.   On: 02/13/2015 01:58   Mr Brain Wo Contrast  02/13/2015  CLINICAL DATA:  60 year old male with acute onset  vertigo since last night. Initial encounter. EXAM: MRI HEAD WITHOUT CONTRAST TECHNIQUE: Multiplanar, multiecho pulse sequences of the brain and surrounding structures were obtained without intravenous contrast. COMPARISON:  Head CT without contrast 0152 hours today. FINDINGS: Cerebral volume is within normal limits for age. No restricted diffusion to suggest acute infarction. No midline shift, mass effect, evidence of mass lesion, ventriculomegaly, extra-axial collection or acute intracranial hemorrhage. Cervicomedullary junction and pituitary are within normal limits. Negative visualized cervical spine. Major intracranial vascular flow voids are within normal limits. Pearline Cables and white matter signal is within normal limits throughout the brain. No encephalomalacia or chronic cerebral blood products. Visible internal auditory structures appear normal. Mastoids are  clear. Paranasal sinuses and mastoids are clear. Negative orbit and scalp soft tissues. Normal bone marrow signal. IMPRESSION: Normal noncontrast MRI appearance of the brain. Electronically Signed   By: Genevie Ann M.D.   On: 02/13/2015 12:41    Assessment & Plan:   Kairon was seen today for tailbone pain, hypertension, hypothyroidism and diabetes.  Diagnoses and all orders for this visit:  Coccydynia- he will continue ibuprofen as needed for discomfort, I've asked him to be seen by pain mngt to consider other modalities to treat the pain, possibly a direct steroid injection into the region.  Essential hypertension- his blood pressure is well-controlled, electrolytes and renal function are stable.  Type 2 diabetes mellitus with complication, with long-term current use of insulin ([place])- his blood sugars are well-controlled, will continue the current regimen for blood sugar control.  Other specified hypothyroidism- his TSH is in the normal range, he will remain on the current dose of levothyroxine.  I have discontinued Mr. Benincasa predniSONE. I am also having him maintain his multivitamin with minerals, vitamin B-12, cholecalciferol, aliskiren, meclizine, pioglitazone-metformin, levothyroxine, Fenofibric Acid, eszopiclone, BASAGLAR KWIKPEN, TEKTURNA, and pravastatin.  No orders of the defined types were placed in this encounter.     Follow-up: Return in about 4 months (around 10/16/2015).  Scarlette Calico, MD

## 2015-06-16 NOTE — Patient Instructions (Signed)
Tailbone Injury °The tailbone (coccyx) is the small bone at the lower end of the spine. A tailbone injury may involve stretched ligaments, bruising, or a broken bone (fracture). Tailbone injuries can be painful, and some may take a long time to heal. °CAUSES °This condition is often caused by falling and landing on the tailbone. Other causes include: °· Repeated strain or friction from actions such as rowing and bicycling. °· Childbirth. °In some cases, the cause may not be known. °RISK FACTORS °This condition is more common in women than in men. °SYMPTOMS °Symptoms of this condition include: °· Pain in the lower back, especially when sitting. °· Pain or difficulty when standing up from a sitting position. °· Bruising in the tailbone area. °· Painful bowel movements. °· In women, pain during intercourse. °DIAGNOSIS °This condition may be diagnosed based on your symptoms and a physical exam. X-rays may be taken if a fracture is suspected. You may also have other tests, such as a CT scan or MRI. °TREATMENT °This condition may be treated with medicines to help relieve your pain. Most tailbone injuries heal on their own in 4-6 weeks. However, recovery time may be longer if the injury involves a fracture. °HOME CARE INSTRUCTIONS °· Take medicines only as directed by your health care provider. °· If directed, apply ice to the injured area: °¨ Put ice in a plastic bag. °¨ Place a towel between your skin and the bag. °¨ Leave the ice on for 20 minutes, 2-3 times per day for the first 1-2 days. °· Sit on a large, rubber or inflated ring or cushion to ease your pain. Lean forward when you are sitting to help decrease discomfort. °· Avoid sitting for long periods of time. °· Increase your activity as the pain allows. Perform any exercises that are recommended by your health care provider or physical therapist. °· If you have pain during bowel movements, use stool softeners as directed by your health care provider. °· Eat a  diet that includes plenty of fiber to help prevent constipation. °· Keep all follow-up visits as directed by your health care provider. This is important. °PREVENTION °Wear appropriate padding and sports gear when bicycling and rowing. This can help to prevent developing an injury that is caused by repeated strain or friction. °SEEK MEDICAL CARE IF: °· Your pain becomes worse. °· Your bowel movements cause a great deal of discomfort. °· You are unable to have a bowel movement. °· You have uncontrolled urine loss (urinary incontinence). °· You have a fever. °  °This information is not intended to replace advice given to you by your health care provider. Make sure you discuss any questions you have with your health care provider. °  °Document Released: 12/24/1999 Document Revised: 05/12/2014 Document Reviewed: 12/22/2013 °Elsevier Interactive Patient Education ©2016 [employer] ° °

## 2015-06-16 NOTE — Progress Notes (Signed)
Pre visit review using our clinic review tool, if applicable. No additional management support is needed unless otherwise documented below in the visit note. 

## 2015-06-17 ENCOUNTER — Encounter: Payer: Self-pay | Admitting: Internal Medicine

## 2015-07-17 ENCOUNTER — Other Ambulatory Visit: Payer: Self-pay | Admitting: Internal Medicine

## 2015-07-26 ENCOUNTER — Ambulatory Visit (INDEPENDENT_AMBULATORY_CARE_PROVIDER_SITE_OTHER): Payer: BLUE CROSS/BLUE SHIELD | Admitting: Endocrinology

## 2015-07-26 ENCOUNTER — Encounter: Payer: Self-pay | Admitting: Endocrinology

## 2015-07-26 VITALS — BP 126/76 | HR 86 | Ht 71.0 in | Wt 225.0 lb

## 2015-07-26 DIAGNOSIS — Z794 Long term (current) use of insulin: Secondary | ICD-10-CM

## 2015-07-26 DIAGNOSIS — E118 Type 2 diabetes mellitus with unspecified complications: Secondary | ICD-10-CM

## 2015-07-26 NOTE — Progress Notes (Signed)
Subjective:    Patient ID: Terry Harrington, male    DOB: 15-Jan-1955, 60 y.o.   MRN: [id number]  HPI Pt returns for f/u of diabetes mellitus: DM type: Insulin-requiring type 2 Dx'ed: 1517 Complications: mild renal insufficiency Therapy: insulin since 2015, and actos+met DKA: never Severe hypoglycemia: never.  Pancreatitis: never Other: in early 2017, he was hospitalized for nonketotic hyperosmolar hyperglycemic state, due to missing insulin doses, he works M-F, day shift.  He says cbg's are higher on weekends; QD insulin is chosen, due to h/o noncompliance; he chooses to continue pioglitizone+met, despite edema.   Interval history: he takes basaglar, 35 units qam.  no cbg record, but states cbg's vary from 70-220.  It is in general higher as the day goes on.  Past Medical History  Diagnosis Date  . Hypertension     on meds  . Heart murmur   . Hypothyroidism     on meds  . GERD (gastroesophageal reflux disease)   . Diabetes mellitus   . Hyperlipidemia     Past Surgical History  Procedure Laterality Date  . Hernia repair      as a child  . Dental surgery      several dental procedure approx 10 between 1999-2012  . Shoulder surgery  05/31/2011     impingment left  . Shoulder arthroscopy  05/31/2011    Procedure: ARTHROSCOPY SHOULDER;  Surgeon: Sharmon Revere, MD;  Location: [facility];  Service: Orthopedics;  Laterality: Left;  Shoulder Acromioplasty Left    Social History   Social History  . Marital Status: Divorced    Spouse Name: N/A  . Number of Children: N/A  . Years of Education: N/A   Occupational History  . Construction    Social History Main Topics  . Smoking status: Former Smoker -- 1.50 packs/day for 26 years    Types: Cigarettes    Quit date: 03/02/2008  . Smokeless tobacco: Former Systems developer    Quit date: 05/25/2008  . Alcohol Use: 0.0 oz/week    0 Standard drinks or equivalent per week     Comment: rarely  . Drug Use: No  . Sexual Activity: Not Currently     Other Topics Concern  . Not on file   Social History Narrative    Current Outpatient Prescriptions on File Prior to Visit  Medication Sig Dispense Refill  . aliskiren (TEKTURNA) 150 MG tablet Take 1 tablet (150 mg total) by mouth daily. 90 tablet 1  . cholecalciferol (VITAMIN D) 1000 UNITS tablet Take 2,000 Units by mouth daily.    . Choline Fenofibrate (FENOFIBRIC ACID) 135 MG CPDR TAKE 1 CAPSULE EVERY DAY 90 capsule 1  . eszopiclone (LUNESTA) 2 MG TABS tablet Take 1 tablet (2 mg total) by mouth at bedtime as needed for sleep. Take immediately before bedtime 30 tablet 5  . Insulin Glargine (BASAGLAR KWIKPEN) 100 UNIT/ML SOPN Inject 0.4 mLs (40 Units total) into the skin every morning. And pen needles 1/day 15 pen 3  . levothyroxine (SYNTHROID, LEVOTHROID) 100 MCG tablet Take 1 tablet (100 mcg total) by mouth daily. 90 tablet 3  . meclizine (ANTIVERT) 25 MG tablet Take 1 tablet (25 mg total) by mouth 3 (three) times daily as needed for dizziness. 30 tablet 0  . Multiple Vitamin (MULITIVITAMIN WITH MINERALS) TABS Take 1 tablet by mouth daily.    . pioglitazone-metformin (ACTOPLUS MET) 15-850 MG tablet TAKE 2 TABLETS EVERY DAY WITH SUPPER 180 tablet 1  . pravastatin (PRAVACHOL) 40  MG tablet Take 1 tablet (40 mg total) by mouth daily. 90 tablet 3  . TEKTURNA 150 MG tablet TAKE 1 TABLET EVERY DAY 90 tablet 1  . vitamin B-12 (CYANOCOBALAMIN) 1000 MCG tablet Take 2,000 mcg by mouth daily.     No current facility-administered medications on file prior to visit.    Allergies  Allergen Reactions  . Crab [Shellfish Allergy] Anaphylaxis  . Iodine Anaphylaxis  . Lisinopril     cough  . Crestor [Rosuvastatin]     Muscle aches  . Iohexol Swelling  . Peanut-Containing Drug Products Itching    And whelps  . Aspirin Rash  . Codeine Rash  . Penicillins Rash    Has patient had a PCN reaction causing immediate rash, facial/tongue/throat swelling, SOB or lightheadedness with hypotension:  Yes Has patient had a PCN reaction causing severe rash involving mucus membranes or skin necrosis: Yes Has patient had a PCN reaction that required hospitalization No Has patient had a PCN reaction occurring within the last 10 years: No If all of the above answers are "NO", then may proceed with Cephalosporin use.     Family History  Problem Relation Age of Onset  . Anesthesia problems Neg Hx   . Hypotension Neg Hx   . Malignant hyperthermia Neg Hx   . Pseudochol deficiency Neg Hx   . Hyperlipidemia Mother   . Hypertension Mother   . Stroke Father   . Kidney disease Father   . Diabetes Father   . Cancer Maternal Grandfather     BP 126/76 mmHg  Pulse 86  Ht _0  (1.803 m)  Wt 225 lb (102.059 kg)  BMI 31.39 kg/m2  SpO2 98%  Review of Systems Denies LOC, but he has gained weight.     Objective:   Physical Exam VITAL SIGNS:  See vs page GENERAL: no distress Pulses: dorsalis pedis intact bilat.   MSK: no deformity of the feet CV: trace bilat leg edema Skin:  no ulcer on the feet.  normal color and temp on the feet. Neuro: sensation is intact to touch on the feet Ext: There is bilateral onychomycosis of the toenails.     Assessment & Plan:  Insulin-requiring type 2 DM: overcontrolled.   Weight gain, new.  This could be contributed to by pioglitizone.  We'll continue for now, as goal is to get off insulin.  However, if weight gain persists, we'll have to change to alternative.      Patient is advised the following: Patient Instructions  check your blood sugar twice a day.  vary the time of day when you check, between before the 3 meals, and at bedtime.  also check if you have symptoms of your blood sugar being too high or too low.  please keep a record of the readings and bring it to your next appointment here (or you can bring the meter itself).  You can write it on any piece of paper.  please call us sooner if your blood sugar goes below 70, or if you have a lot of  readings over 200.   Please reduce the basaglar to 25 units each morning.   Please come back for a follow-up appointment in 2 months.   Our plan will be to see if you can get off the insulin.     Renato Shin, MD

## 2015-07-26 NOTE — Patient Instructions (Addendum)
check your blood sugar twice a day.  vary the time of day when you check, between before the 3 meals, and at bedtime.  also check if you have symptoms of your blood sugar being too high or too low.  please keep a record of the readings and bring it to your next appointment here (or you can bring the meter itself).  You can write it on any piece of paper.  please call us sooner if your blood sugar goes below 70, or if you have a lot of readings over 200.   Please reduce the basaglar to 25 units each morning.   Please come back for a follow-up appointment in 2 months.   Our plan will be to see if you can get off the insulin.

## 2015-09-22 ENCOUNTER — Other Ambulatory Visit (INDEPENDENT_AMBULATORY_CARE_PROVIDER_SITE_OTHER): Payer: BLUE CROSS/BLUE SHIELD

## 2015-09-22 DIAGNOSIS — Z794 Long term (current) use of insulin: Secondary | ICD-10-CM

## 2015-09-22 DIAGNOSIS — E118 Type 2 diabetes mellitus with unspecified complications: Secondary | ICD-10-CM | POA: Diagnosis not present

## 2015-09-22 LAB — LIPID PANEL
Cholesterol: 145 mg/dL (ref 0–200)
HDL: 59.4 mg/dL (ref >39.00)
LDL Cholesterol: 71 mg/dL (ref 0–99)
NonHDL: 85.62
Total CHOL/HDL Ratio: 2
Triglycerides: 71 mg/dL (ref 0.0–149.0)
VLDL: 14.2 mg/dL (ref 0.0–40.0)

## 2015-09-22 LAB — HEMOGLOBIN A1C: HEMOGLOBIN A1C: 6.8 % — AB (ref 4.6–6.5)

## 2015-09-27 ENCOUNTER — Ambulatory Visit (INDEPENDENT_AMBULATORY_CARE_PROVIDER_SITE_OTHER): Payer: BLUE CROSS/BLUE SHIELD | Admitting: Endocrinology

## 2015-09-27 VITALS — BP 128/82 | HR 77 | Ht 71.0 in | Wt 222.0 lb

## 2015-09-27 DIAGNOSIS — E118 Type 2 diabetes mellitus with unspecified complications: Secondary | ICD-10-CM | POA: Diagnosis not present

## 2015-09-27 MED ORDER — SITAGLIPTIN PHOSPHATE 100 MG PO TABS: 100.0000 mg | ORAL_TABLET | Freq: Every day | ORAL | 11 refills | Status: DC

## 2015-09-27 NOTE — Progress Notes (Signed)
Subjective:    Patient ID: Terry Harrington, male    DOB: 08-Apr-1955, 60 y.o.   MRN: [id number]  HPI Pt returns for f/u of diabetes mellitus: DM type: Insulin-requiring type 2 Dx'ed: 3524 Complications: mild renal insufficiency Therapy: insulin since 2015, and actos+met.  DKA: never Severe hypoglycemia: never.  Pancreatitis: never Other: in early 2017, he was hospitalized for nonketotic hyperosmolar hyperglycemic state, due to missing insulin doses, he works M-F, day shift.  He says cbg's are higher on weekends; QD insulin is chosen, due to h/o noncompliance; he chooses to continue pioglitizone+met, despite edema.   Interval history: he takes basaglar, 20 units qam.  no cbg record, but states cbg's are well-controlled.  pt states he feels well in general.  Past Medical History:  Diagnosis Date  . Diabetes mellitus   . GERD (gastroesophageal reflux disease)   . Heart murmur   . Hyperlipidemia   . Hypertension    on meds  . Hypothyroidism    on meds    Past Surgical History:  Procedure Laterality Date  . DENTAL SURGERY     several dental procedure approx 10 between 1999-2012  . HERNIA REPAIR     as a child  . SHOULDER ARTHROSCOPY  05/31/2011   Procedure: ARTHROSCOPY SHOULDER;  Surgeon: Sharmon Revere, MD;  Location: [facility];  Service: Orthopedics;  Laterality: Left;  Shoulder Acromioplasty Left  . SHOULDER SURGERY  05/31/2011    impingment left    Social History   Social History  . Marital status: Divorced    Spouse name: N/A  . Number of children: N/A  . Years of education: N/A   Occupational History  . Construction    Social History Main Topics  . Smoking status: Former Smoker    Packs/day: 1.50    Years: 26.00    Types: Cigarettes    Quit date: 03/02/2008  . Smokeless tobacco: Former Systems developer    Quit date: 05/25/2008  . Alcohol use 0.0 oz/week     Comment: rarely  . Drug use: No  . Sexual activity: Not Currently   Other Topics Concern  . Not on file    Social History Narrative  . No narrative on file    Current Outpatient Prescriptions on File Prior to Visit  Medication Sig Dispense Refill  . aliskiren (TEKTURNA) 150 MG tablet Take 1 tablet (150 mg total) by mouth daily. 90 tablet 1  . cholecalciferol (VITAMIN D) 1000 UNITS tablet Take 2,000 Units by mouth daily.    . Choline Fenofibrate (FENOFIBRIC ACID) 135 MG CPDR TAKE 1 CAPSULE EVERY DAY 90 capsule 1  . levothyroxine (SYNTHROID, LEVOTHROID) 100 MCG tablet Take 1 tablet (100 mcg total) by mouth daily. 90 tablet 3  . meclizine (ANTIVERT) 25 MG tablet Take 1 tablet (25 mg total) by mouth 3 (three) times daily as needed for dizziness. 30 tablet 0  . Multiple Vitamin (MULITIVITAMIN WITH MINERALS) TABS Take 1 tablet by mouth daily.    . pioglitazone-metformin (ACTOPLUS MET) 15-850 MG tablet TAKE 2 TABLETS EVERY DAY WITH SUPPER 180 tablet 1  . pravastatin (PRAVACHOL) 40 MG tablet Take 1 tablet (40 mg total) by mouth daily. 90 tablet 3  . vitamin B-12 (CYANOCOBALAMIN) 1000 MCG tablet Take 2,000 mcg by mouth daily.    . eszopiclone (LUNESTA) 2 MG TABS tablet Take 1 tablet (2 mg total) by mouth at bedtime as needed for sleep. Take immediately before bedtime (Patient not taking: Reported on 09/27/2015) 30 tablet 5  No current facility-administered medications on file prior to visit.     Allergies  Allergen Reactions  . Crab [Shellfish Allergy] Anaphylaxis  . Iodine Anaphylaxis  . Lisinopril     cough  . Crestor [Rosuvastatin]     Muscle aches  . Iohexol Swelling  . Peanut-Containing Drug Products Itching    And whelps  . Aspirin Rash  . Codeine Rash  . Penicillins Rash    Has patient had a PCN reaction causing immediate rash, facial/tongue/throat swelling, SOB or lightheadedness with hypotension: Yes Has patient had a PCN reaction causing severe rash involving mucus membranes or skin necrosis: Yes Has patient had a PCN reaction that required hospitalization No Has patient had a  PCN reaction occurring within the last 10 years: No If all of the above answers are "NO", then may proceed with Cephalosporin use.     Family History  Problem Relation Age of Onset  . Anesthesia problems Neg Hx   . Hypotension Neg Hx   . Malignant hyperthermia Neg Hx   . Pseudochol deficiency Neg Hx   . Hyperlipidemia Mother   . Hypertension Mother   . Stroke Father   . Kidney disease Father   . Diabetes Father   . Cancer Maternal Grandfather     BP 128/82   Pulse 77   Ht 5' 11" (1.803 m)   Wt 222 lb (100.7 kg)   SpO2 96%   BMI 30.96 kg/m    Review of Systems He denies hypoglycemia    Objective:   Physical Exam VITAL SIGNS:  See vs page GENERAL: no distress Pulses: dorsalis pedis intact bilat.   MSK: no deformity of the feet CV: trace bilat leg edema Skin:  no ulcer on the feet.  normal color and temp on the feet. Neuro: sensation is intact to touch on the feet Ext: There is bilateral onychomycosis of the toenails.   Lab Results  Component Value Date   HGBA1C 6.8 (H) 09/22/2015      Assessment & Plan:  Type 2 DM: he is ready to d/c insulin  

## 2015-09-27 NOTE — Patient Instructions (Addendum)
check your blood sugar twice a day.  vary the time of day when you check, between before the 3 meals, and at bedtime.  also check if you have symptoms of your blood sugar being too high or too low.  please keep a record of the readings and bring it to your next appointment here (or you can bring the meter itself).  You can write it on any piece of paper.  please call us sooner if your blood sugar goes below 70, or if you have a lot of readings over 200.   Please go off  the basaglar, and: Please continue the same pioglitizone + met, and: I have sent a prescription to your pharmacy, to add [place].  Please come back for a follow-up appointment in 2 months.

## 2015-10-18 ENCOUNTER — Ambulatory Visit: Payer: BLUE CROSS/BLUE SHIELD | Admitting: Physical Medicine & Rehabilitation

## 2015-10-18 ENCOUNTER — Ambulatory Visit (INDEPENDENT_AMBULATORY_CARE_PROVIDER_SITE_OTHER): Payer: BLUE CROSS/BLUE SHIELD | Admitting: Internal Medicine

## 2015-10-18 ENCOUNTER — Encounter: Payer: Self-pay | Admitting: Internal Medicine

## 2015-10-18 VITALS — BP 142/82 | HR 70 | Temp 98.8°F | Ht 71.0 in | Wt 222.0 lb

## 2015-10-18 DIAGNOSIS — Z2911 Encounter for prophylactic immunotherapy for respiratory syncytial virus (RSV): Secondary | ICD-10-CM

## 2015-10-18 DIAGNOSIS — E032 Hypothyroidism due to medicaments and other exogenous substances: Secondary | ICD-10-CM | POA: Diagnosis not present

## 2015-10-18 DIAGNOSIS — I10 Essential (primary) hypertension: Secondary | ICD-10-CM | POA: Diagnosis not present

## 2015-10-18 DIAGNOSIS — Z23 Encounter for immunization: Secondary | ICD-10-CM

## 2015-10-18 NOTE — Progress Notes (Signed)
Subjective:  Patient ID: Terry Harrington, male    DOB: [birth year]  Age: 60 y.o. MRN: [id number]  CC: Diabetes (Pt stated Aic 6.7 last month) and Hypertension   HPI Terry Harrington presents for follow-up on diabetes, hypothyroidism, and hypertension. He feels well and offers no complaints. His recent A1c was 6.7%. He denies headache, or vision, chest pain, shortness of breath, constipation, edema, or fatigue.  Outpatient Medications Prior to Visit  Medication Sig Dispense Refill  . aliskiren (TEKTURNA) 150 MG tablet Take 1 tablet (150 mg total) by mouth daily. 90 tablet 1  . cholecalciferol (VITAMIN D) 1000 UNITS tablet Take 2,000 Units by mouth daily.    . eszopiclone (LUNESTA) 2 MG TABS tablet Take 1 tablet (2 mg total) by mouth at bedtime as needed for sleep. Take immediately before bedtime 30 tablet 5  . levothyroxine (SYNTHROID, LEVOTHROID) 100 MCG tablet Take 1 tablet (100 mcg total) by mouth daily. 90 tablet 3  . meclizine (ANTIVERT) 25 MG tablet Take 1 tablet (25 mg total) by mouth 3 (three) times daily as needed for dizziness. 30 tablet 0  . Multiple Vitamin (MULITIVITAMIN WITH MINERALS) TABS Take 1 tablet by mouth daily.    . pioglitazone-metformin (ACTOPLUS MET) 15-850 MG tablet TAKE 2 TABLETS EVERY DAY WITH SUPPER 180 tablet 1  . pravastatin (PRAVACHOL) 40 MG tablet Take 1 tablet (40 mg total) by mouth daily. 90 tablet 3  . sitaGLIPtin (JANUVIA) 100 MG tablet Take 1 tablet (100 mg total) by mouth daily. 30 tablet 11  . vitamin B-12 (CYANOCOBALAMIN) 1000 MCG tablet Take 2,000 mcg by mouth daily.    . Choline Fenofibrate (FENOFIBRIC ACID) 135 MG CPDR TAKE 1 CAPSULE EVERY DAY 90 capsule 1   No facility-administered medications prior to visit.     ROS Review of Systems  Constitutional: Negative.  Negative for activity change, appetite change, diaphoresis, fatigue and fever.  HENT: Negative.  Negative for trouble swallowing and voice change.   Eyes: Negative.   Respiratory:  Negative.  Negative for cough, choking, chest tightness, shortness of breath and stridor.   Cardiovascular: Negative.  Negative for chest pain, palpitations and leg swelling.  Gastrointestinal: Negative.  Negative for abdominal pain, constipation, diarrhea, nausea and vomiting.  Endocrine: Negative for cold intolerance, heat intolerance, polydipsia, polyphagia and polyuria.  Genitourinary: Negative.  Negative for difficulty urinating.  Musculoskeletal: Negative.  Negative for back pain, joint swelling and neck pain.  Skin: Negative.  Negative for color change and rash.  Allergic/Immunologic: Negative.   Neurological: Negative.  Negative for dizziness, tremors, weakness, light-headedness and numbness.  Hematological: Negative for adenopathy. Does not bruise/bleed easily.  Psychiatric/Behavioral: Negative.     Objective:  BP (!) 142/82 (BP Location: Left Arm, Patient Position: Sitting, Cuff Size: Normal)   Pulse 70   Temp 98.8 F (37.1 C)   Ht 5' 11" (1.803 m)   Wt 222 lb (100.7 kg)   SpO2 98%   BMI 30.96 kg/m   BP Readings from Last 3 Encounters:  10/18/15 (!) 142/82  09/27/15 128/82  07/26/15 126/76    Wt Readings from Last 3 Encounters:  10/18/15 222 lb (100.7 kg)  09/27/15 222 lb (100.7 kg)  07/26/15 225 lb (102.1 kg)    Physical Exam  Constitutional: He is oriented to person, place, and time. No distress.  HENT:  Mouth/Throat: Oropharynx is clear and moist. No oropharyngeal exudate.  Eyes: Conjunctivae are normal. Right eye exhibits no discharge. Left eye exhibits no discharge. No scleral  icterus.  Neck: Normal range of motion. Neck supple. No JVD present. No tracheal deviation present. No thyromegaly present.  Cardiovascular: Normal rate, regular rhythm, normal heart sounds and intact distal pulses.  Exam reveals no gallop and no friction rub.   No murmur heard. Pulmonary/Chest: Effort normal and breath sounds normal. No stridor. No respiratory distress. He has no  wheezes. He has no rales. He exhibits no tenderness.  Abdominal: Soft. Bowel sounds are normal. He exhibits no distension and no mass. There is no tenderness. There is no rebound and no guarding.  Musculoskeletal: Normal range of motion. He exhibits no edema, tenderness or deformity.  Lymphadenopathy:    He has no cervical adenopathy.  Neurological: He is oriented to person, place, and time.  Skin: Skin is warm and dry. No rash noted. He is not diaphoretic. No erythema. No pallor.  Vitals reviewed.   Lab Results  Component Value Date   WBC 5.6 02/18/2015   HGB 13.6 02/18/2015   HCT 41.4 02/18/2015   PLT 306.0 02/18/2015   GLUCOSE 116 (H) 06/16/2015   CHOL 145 09/22/2015   TRIG 71.0 09/22/2015   HDL 59.40 09/22/2015   LDLDIRECT 132.8 02/21/2013   LDLCALC 71 09/22/2015   ALT 11 06/16/2015   AST 12 06/16/2015   NA 138 06/16/2015   K 4.2 06/16/2015   CL 105 06/16/2015   CREATININE 1.32 06/16/2015   BUN 21 06/16/2015   CO2 27 06/16/2015   TSH 3.08 06/16/2015   PSA 0.09 (L) 02/21/2013   HGBA1C 6.8 (H) 09/22/2015   MICROALBUR <0.7 02/18/2015    Ct Head Wo Contrast  Result Date: 02/13/2015 CLINICAL DATA:  Acute onset of vertigo. Dizziness, nausea and near syncope. EXAM: CT HEAD WITHOUT CONTRAST TECHNIQUE: Contiguous axial images were obtained from the base of the skull through the vertex without intravenous contrast. COMPARISON:  None. FINDINGS: No intracranial hemorrhage, mass effect, or midline shift. No hydrocephalus. The basilar cisterns are patent. No evidence of territorial infarct. No intracranial fluid collection. Calvarium is intact. Included paranasal sinuses and mastoid air cells are well aerated. IMPRESSION: No acute intracranial abnormality. Electronically Signed   By: Jeb Levering M.D.   On: 02/13/2015 01:58   Mr Brain Wo Contrast  Result Date: 02/13/2015 CLINICAL DATA:  60 year old male with acute onset vertigo since last night. Initial encounter. EXAM: MRI HEAD  WITHOUT CONTRAST TECHNIQUE: Multiplanar, multiecho pulse sequences of the brain and surrounding structures were obtained without intravenous contrast. COMPARISON:  Head CT without contrast 0152 hours today. FINDINGS: Cerebral volume is within normal limits for age. No restricted diffusion to suggest acute infarction. No midline shift, mass effect, evidence of mass lesion, ventriculomegaly, extra-axial collection or acute intracranial hemorrhage. Cervicomedullary junction and pituitary are within normal limits. Negative visualized cervical spine. Major intracranial vascular flow voids are within normal limits. [place] Cables and white matter signal is within normal limits throughout the brain. No encephalomalacia or chronic cerebral blood products. Visible internal auditory structures appear normal. Mastoids are clear. Paranasal sinuses and mastoids are clear. Negative orbit and scalp soft tissues. Normal bone marrow signal. IMPRESSION: Normal noncontrast MRI appearance of the brain. Electronically Signed   By: Genevie Ann M.D.   On: 02/13/2015 12:41    Assessment & Plan:   Leiby was seen today for diabetes and hypertension.  Diagnoses and all orders for this visit:  Essential hypertension- his blood pressure is well-controlled, recent electrolytes and renal function were stable.  Hypothyroidism due to medication- he feels well and appears  euthyroid, his recent TSH was in the normal range, we'll continue the current dose of levothyroxine.  Need for prophylactic vaccination and inoculation against influenza -     Flu Vaccine QUAD 36+ mos IM  Need for shingles vaccine -     Varicella-zoster vaccine subcutaneous   I have discontinued Mr. Bauserman Fenofibric Acid and BASAGLAR KWIKPEN. I am also having him maintain his multivitamin with minerals, vitamin B-12, cholecalciferol, aliskiren, meclizine, pioglitazone-metformin, levothyroxine, eszopiclone, pravastatin, and sitaGLIPtin.  Meds ordered this encounter    Medications  . DISCONTD: Insulin Glargine (BASAGLAR KWIKPEN) 100 UNIT/ML SOPN     Follow-up: Return in about 4 months (around 02/18/2016).  Scarlette Calico, MD

## 2015-10-18 NOTE — Progress Notes (Signed)
Pre visit review using our clinic review tool, if applicable. No additional management support is needed unless otherwise documented below in the visit note. 

## 2015-10-18 NOTE — Patient Instructions (Signed)

## 2015-11-09 ENCOUNTER — Other Ambulatory Visit: Payer: Self-pay | Admitting: Internal Medicine

## 2015-11-09 DIAGNOSIS — I10 Essential (primary) hypertension: Secondary | ICD-10-CM

## 2015-11-28 NOTE — Progress Notes (Signed)
Subjective:    Patient ID: Terry Harrington, male    DOB: August 18, 1955, 60 y.o.   MRN: [id number]  HPI Pt returns for f/u of diabetes mellitus: DM type: Insulin-requiring type 2 Dx'ed: 0962 Complications: mild renal insufficiency Therapy:  3 oral meds DKA: never Severe hypoglycemia: never.  Pancreatitis: never Other: he took insulin 2015-2017; in early 2017, he was hospitalized for nonketotic hyperosmolar hyperglycemic state, due to missing insulin doses, he works M-F, day shift.  He says cbg's are higher on weekends;  Interval history: no cbg record, but states cbg's are well-controlled.  pt states he feels well in general.  Past Medical History:  Diagnosis Date  . Diabetes mellitus   . GERD (gastroesophageal reflux disease)   . Heart murmur   . Hyperlipidemia   . Hypertension    on meds  . Hypothyroidism    on meds    Past Surgical History:  Procedure Laterality Date  . DENTAL SURGERY     several dental procedure approx 10 between 1999-2012  . HERNIA REPAIR     as a child  . SHOULDER ARTHROSCOPY  05/31/2011   Procedure: ARTHROSCOPY SHOULDER;  Surgeon: Sharmon Revere, MD;  Location: [facility];  Service: Orthopedics;  Laterality: Left;  Shoulder Acromioplasty Left  . SHOULDER SURGERY  05/31/2011    impingment left    Social History   Social History  . Marital status: Divorced    Spouse name: N/A  . Number of children: N/A  . Years of education: N/A   Occupational History  . Construction    Social History Main Topics  . Smoking status: Former Smoker    Packs/day: 1.50    Years: 26.00    Types: Cigarettes    Quit date: 03/02/2008  . Smokeless tobacco: Former Systems developer    Quit date: 05/25/2008  . Alcohol use 0.0 oz/week     Comment: rarely  . Drug use: No  . Sexual activity: Not Currently   Other Topics Concern  . Not on file   Social History Narrative  . No narrative on file    Current Outpatient Prescriptions on File Prior to Visit  Medication Sig Dispense  Refill  . cholecalciferol (VITAMIN D) 1000 UNITS tablet Take 2,000 Units by mouth daily.    Marland Kitchen levothyroxine (SYNTHROID, LEVOTHROID) 100 MCG tablet Take 1 tablet (100 mcg total) by mouth daily. 90 tablet 3  . meclizine (ANTIVERT) 25 MG tablet Take 1 tablet (25 mg total) by mouth 3 (three) times daily as needed for dizziness. 30 tablet 0  . Multiple Vitamin (MULITIVITAMIN WITH MINERALS) TABS Take 1 tablet by mouth daily.    . pioglitazone-metformin (ACTOPLUS MET) 15-850 MG tablet TAKE 2 TABLETS EVERY DAY WITH SUPPER 180 tablet 1  . pravastatin (PRAVACHOL) 40 MG tablet Take 1 tablet (40 mg total) by mouth daily. 90 tablet 3  . sitaGLIPtin (JANUVIA) 100 MG tablet Take 1 tablet (100 mg total) by mouth daily. 30 tablet 11  . TEKTURNA 150 MG tablet TAKE 1 TABLET (150 MG TOTAL) BY MOUTH DAILY. 90 tablet 1  . vitamin B-12 (CYANOCOBALAMIN) 1000 MCG tablet Take 2,000 mcg by mouth daily.     No current facility-administered medications on file prior to visit.     Allergies  Allergen Reactions  . Crab [Shellfish Allergy] Anaphylaxis  . Iodine Anaphylaxis  . Lisinopril     cough  . Crestor [Rosuvastatin]     Muscle aches  . Iohexol Swelling  . Peanut-Containing Drug Products  Itching    And whelps  . Aspirin Rash  . Codeine Rash  . Penicillins Rash    Has patient had a PCN reaction causing immediate rash, facial/tongue/throat swelling, SOB or lightheadedness with hypotension: Yes Has patient had a PCN reaction causing severe rash involving mucus membranes or skin necrosis: Yes Has patient had a PCN reaction that required hospitalization No Has patient had a PCN reaction occurring within the last 10 years: No If all of the above answers are "NO", then may proceed with Cephalosporin use.     Family History  Problem Relation Age of Onset  . Anesthesia problems Neg Hx   . Hypotension Neg Hx   . Malignant hyperthermia Neg Hx   . Pseudochol deficiency Neg Hx   . Hyperlipidemia Mother   .  Hypertension Mother   . Stroke Father   . Kidney disease Father   . Diabetes Father   . Cancer Maternal Grandfather     BP 132/70   Pulse 71   Ht '5\' 11"'$  (1.803 m)   Wt 222 lb (100.7 kg)   SpO2 96%   BMI 30.96 kg/m    Review of Systems He denies hypoglycemia    Objective:   Physical Exam VITAL SIGNS:  See vs page GENERAL: no distress Pulses: dorsalis pedis intact bilat.   MSK: no deformity of the feet CV: trace bilat leg edema Skin:  no ulcer on the feet.  normal color and temp on the feet. Neuro: sensation is intact to touch on the feet.   Ext: There is bilateral onychomycosis of the toenails.     A1c=6.6%    Assessment & Plan:  Insulin-requiring type 2 DM, with renal insufficiency: well-controlled.  I told him a lower a1c would be better still, but he declines to add another med.  Patient is advised the following: Patient Instructions  check your blood sugar twice a day.  vary the time of day when you check, between before the 3 meals, and at bedtime.  also check if you have symptoms of your blood sugar being too high or too low.  please keep a record of the readings and bring it to your next appointment here (or you can bring the meter itself).  You can write it on any piece of paper.  please call us sooner if your blood sugar goes below 70, or if you have a lot of readings over 200.   Please continue the same medications  Please come back for a follow-up appointment in 4 months.

## 2015-11-30 ENCOUNTER — Encounter: Payer: Self-pay | Admitting: Physical Medicine & Rehabilitation

## 2015-11-30 ENCOUNTER — Ambulatory Visit
Admission: RE | Admit: 2015-11-30 | Discharge: 2015-11-30 | Disposition: A | Discharge status: Home / Self Care | Payer: BLUE CROSS/BLUE SHIELD | Source: Ambulatory Visit | Attending: Physical Medicine & Rehabilitation | Admitting: Physical Medicine & Rehabilitation

## 2015-11-30 ENCOUNTER — Encounter
Payer: BLUE CROSS/BLUE SHIELD | Attending: Physical Medicine & Rehabilitation | Admitting: Physical Medicine & Rehabilitation

## 2015-11-30 ENCOUNTER — Encounter: Payer: Self-pay | Admitting: Endocrinology

## 2015-11-30 ENCOUNTER — Ambulatory Visit (INDEPENDENT_AMBULATORY_CARE_PROVIDER_SITE_OTHER): Payer: BLUE CROSS/BLUE SHIELD | Admitting: Endocrinology

## 2015-11-30 VITALS — BP 147/91 | HR 72 | Resp 14

## 2015-11-30 DIAGNOSIS — M4120 Other idiopathic scoliosis, site unspecified: Secondary | ICD-10-CM | POA: Insufficient documentation

## 2015-11-30 DIAGNOSIS — Z87891 Personal history of nicotine dependence: Secondary | ICD-10-CM | POA: Insufficient documentation

## 2015-11-30 DIAGNOSIS — R42 Dizziness and giddiness: Secondary | ICD-10-CM | POA: Diagnosis not present

## 2015-11-30 DIAGNOSIS — I10 Essential (primary) hypertension: Secondary | ICD-10-CM | POA: Insufficient documentation

## 2015-11-30 DIAGNOSIS — M533 Sacrococcygeal disorders, not elsewhere classified: Secondary | ICD-10-CM | POA: Insufficient documentation

## 2015-11-30 DIAGNOSIS — M4105 Infantile idiopathic scoliosis, thoracolumbar region: Secondary | ICD-10-CM | POA: Diagnosis not present

## 2015-11-30 DIAGNOSIS — E118 Type 2 diabetes mellitus with unspecified complications: Secondary | ICD-10-CM | POA: Diagnosis not present

## 2015-11-30 DIAGNOSIS — E039 Hypothyroidism, unspecified: Secondary | ICD-10-CM | POA: Diagnosis not present

## 2015-11-30 DIAGNOSIS — E119 Type 2 diabetes mellitus without complications: Secondary | ICD-10-CM | POA: Diagnosis not present

## 2015-11-30 DIAGNOSIS — E785 Hyperlipidemia, unspecified: Secondary | ICD-10-CM | POA: Diagnosis not present

## 2015-11-30 DIAGNOSIS — M4185 Other forms of scoliosis, thoracolumbar region: Secondary | ICD-10-CM | POA: Diagnosis not present

## 2015-11-30 LAB — POCT GLYCOSYLATED HEMOGLOBIN (HGB A1C): HEMOGLOBIN A1C: 6.6

## 2015-11-30 MED ORDER — DICLOFENAC SODIUM 75 MG PO TBEC: 75.0000 mg | DELAYED_RELEASE_TABLET | Freq: Two times a day (BID) | ORAL | 3 refills | Status: DC

## 2015-11-30 MED ORDER — METHOCARBAMOL 500 MG PO TABS: 500.0000 mg | ORAL_TABLET | Freq: Four times a day (QID) | ORAL | 2 refills | Status: DC | PRN

## 2015-11-30 NOTE — Patient Instructions (Addendum)
check your blood sugar twice a day.  vary the time of day when you check, between before the 3 meals, and at bedtime.  also check if you have symptoms of your blood sugar being too high or too low.  please keep a record of the readings and bring it to your next appointment here (or you can bring the meter itself).  You can write it on any piece of paper.  please call us sooner if your blood sugar goes below 70, or if you have a lot of readings over 200.   Please continue the same medications.  Please come back for a follow-up appointment in 4 months.    

## 2015-11-30 NOTE — Progress Notes (Signed)
Subjective:    Patient ID: Terry Harrington, male    DOB: 12/20/1955, 60 y.o.   MRN: [id number]  HPI  Terry Harrington is referred by Dr. Sanda Lingerhomas Jones here for an evaluation for coccygeal pain. He's had pain in coccyx for over a year. He doesn't recall a fall or injury. About 9 months ago he remembers being on an exercise bike. When he got off he felt an excruciating pain between his testicles/anal area. He reports the pain became more severe and involved his "tail bone area".  After about 2 months he saw his primary who placed him on a steroid taper. He states that this was the last intervention for his pain. He has used advil at home, 400mg  every 2 weeks when the pain becomes more severe. It helps slightly. He hasn't used ice or heat.   Pain is worsened with sitting and laying. Standing is not quite as bad. 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 He is sitting down for 85% of the day. He does a lot of "bouncing" in his seat.   His history is also notable for idiopathic juvenile scoliosis. He wore a back brace for 3-4 years as a teen ager. He never had any back surgery. He also deals with DM2, HTN and hypothyroidism. He also has intermittent vertigo.   No imaging is available for review. He states he's never had an xray to assess the problem.  Pain Inventory Average Pain 7 Pain Right Now 3 My pain is aching  In the last 24 hours, has pain interfered with the following? General activity 8 Relation with others 0 Enjoyment of life 0 What TIME of day is your pain at its worst? not answered Sleep (in general) NA  Pain is worse with: walking, bending, sitting, inactivity, standing and some activites Pain improves with: rest and therapy/exercise Relief from Meds: 5  Mobility walk without assistance ability to climb steps?  yes do you drive?  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  no  Physicians involved in your care Any changes since last visit?  no Primary care Sanda Lingerhomas Jones   Family History  Problem Relation Age of Onset  . Anesthesia problems Neg Hx   . Hypotension Neg Hx   . Malignant hyperthermia Neg Hx   . Pseudochol deficiency Neg Hx   . Hyperlipidemia Mother   . Hypertension Mother   . Stroke Father   . Kidney disease Father   . Diabetes Father   . Cancer Maternal Grandfather    Social History   Social History  . Marital status: Divorced    Spouse name: N/A  . Number of children: N/A  . Years of education: N/A   Occupational History  . Construction    Social History Main Topics  . Smoking status: Former Smoker    Packs/day: 1.50    Years: 26.00    Types: Cigarettes    Quit date: 03/02/2008  . Smokeless tobacco: Former NeurosurgeonUser    Quit date: 05/25/2008  . Alcohol use 0.0 oz/week     Comment: rarely  . Drug use: No  . Sexual activity: Not Currently   Other Topics Concern  . None   Social History Narrative  . None   Past Surgical History:  Procedure Laterality Date  . DENTAL SURGERY  several dental procedure approx 10 between 1999-2012  . HERNIA REPAIR     as a child  . SHOULDER ARTHROSCOPY  05/31/2011   Procedure: ARTHROSCOPY SHOULDER;  Surgeon: Kennieth Rad, MD;  Location: [facility] OR;  Service: Orthopedics;  Laterality: Left;  Shoulder Acromioplasty Left  . SHOULDER SURGERY  05/31/2011    impingment left   Past Medical History:  Diagnosis Date  . Diabetes mellitus   . GERD (gastroesophageal reflux disease)   . Heart murmur   . Hyperlipidemia   . Hypertension    on meds  . Hypothyroidism    on meds   BP (!) 147/91   Pulse 72   Resp 14   SpO2 99%   Opioid Risk Score:   Fall Risk Score:  `1  Depression screen PHQ 2/9  Depression screen [facility] 2/9 11/30/2015 01/23/2014 04/18/2013 04/18/2013  Decreased Interest 0 0 0 0  Down, Depressed, Hopeless 0 0 0 0  PHQ - 2  Score 0 0 0 0  Altered sleeping 3 - - -  Tired, decreased energy 1 - - -  Change in appetite 0 - - -  Feeling bad or failure about yourself  0 - - -  Trouble concentrating 0 - - -  Moving slowly or fidgety/restless 0 - - -  Suicidal thoughts 0 - - -  PHQ-9 Score 4 - - -   Review of Systems  Constitutional: Negative.   HENT: Negative.   Eyes: Negative.   Respiratory: Negative.   Cardiovascular: Negative.   Gastrointestinal: Negative.   Endocrine:       High blood sugar  Genitourinary: Negative.   Musculoskeletal: Negative.   Skin: Negative.   Allergic/Immunologic: Negative.   Neurological: Negative.   Hematological: Negative.   Psychiatric/Behavioral: Negative.   All other systems reviewed and are negative.      Objective:   Physical Exam  General: Alert and oriented x 3, No apparent distress. Normal build.  HEENT: Head is normocephalic, atraumatic, PERRLA, EOMI, sclera anicteric, oral mucosa pink and moist, dentition intact, ext ear canals clear,  Neck: Supple without JVD or lymphadenopathy Heart: Reg rate and rhythm. No murmurs rubs or gallops Chest: CTA bilaterally without wheezes, rales, or rhonchi; no distress Abdomen: Soft, non-tender, non-distended, bowel sounds positive. Extremities: No clubbing, cyanosis, or edema. Pulses are 2+ Skin: Clean and intact without signs of breakdown Neuro: Pt is cognitively appropriate with normal insight, memory, and awareness. Cranial nerves 2-12 are intact. Sensory exam is normal except for mild sensory loss in both feet. Reflexes are 2+ in all 4's. Fine motor coordination is intact. No tremors. Motor function is grossly 5/5.  Musculoskeletal: dextroscoliosis apex at T12, L1 20 degrees. Right paraspinals tight along curve. He also has pain along gluteal area right more than left. Coccyx painful to touch. He is able to flex to almost 90 degrees but reports back, coccyx, buttock pain with this motion. Side bending to the right also  causes pain in his coccyx and right glute. His exam is also noticeable for extreme tightness in his hamstrings, hip rotators. He had difficulty with straight leg raise and FABER due to muscle tightness. Right hemipelvis is slightly elevated. His gait is fairly well compensated. Has nornal balance Full ROM, No pain with AROM or PROM in the neck, trunk, or extremities. Posture appropriate Psych: Pt's affect is appropriate. Pt is cooperative. pleasant         Assessment & Plan:  1. Coccydynia: no apparent trauma, but may  be a cumulative effect of his chronic sitting/bouncing at work in addition to impaired posture/mechanics 2. Juvenile Idiopathic Scoliosis.  3. DM2    Plan:  1. Beyond his areas of pain, what is most impressive with his exam is the amount of muscle tightness,spasm he has. We discussed basic stretches today, and I will send him for physical therapy as well to address improved mechanics and ROM if xrays are reasonable.  2. For spasm, I added robaxin 500mg  q6 prn 3. Introduce scheduled diclofenac 75mg  BID with food for now. He was advised to call if he experienced any side effects 4. Sent for lumbar xrays as well as xrays of the sacrum/coccyx 5. Discussed using a foam/gel cushion while sitting at work in his seat 6. Thirty minutes of face to face patient care time were spent during this visit. All questions were encouraged and answered.    I would like to thank Dr. Yetta BarreJones for the opportunity to participate in the care of this pleasant gentleman!

## 2015-11-30 NOTE — Patient Instructions (Signed)
Low Back Sprain Rehab  Ask your health care provider which exercises are safe for you. Do exercises exactly as told by your health care provider and adjust them as directed. It is normal to feel mild stretching, pulling, tightness, or discomfort as you do these exercises, but you should stop right away if you feel sudden pain or your pain gets worse. Do not begin these exercises until told by your health care provider.  Stretching and range of motion exercises  These exercises warm up your muscles and joints and improve the movement and flexibility of your back. These exercises also help to relieve pain, numbness, and tingling.  Exercise A: Lumbar rotation     1. Lie on your back on a firm surface and bend your knees.  2. Straighten your arms out to your sides so each arm forms an "L" shape with a side of your body (a 90 degree angle).  3. Slowly move both of your knees to one side of your body until you feel a stretch in your lower back. Try not to let your shoulders move off of the floor.  4. Hold for __________ seconds.  5. Tense your abdominal muscles and slowly move your knees back to the starting position.  6. Repeat this exercise on the other side of your body.  Repeat __________ times. Complete this exercise __________ times a day.  Exercise B: Prone extension on elbows     1. Lie on your abdomen on a firm surface.  2. Prop yourself up on your elbows.  3. Use your arms to help lift your chest up until you feel a gentle stretch in your abdomen and your lower back.  ? This will place some of your body weight on your elbows. If this is uncomfortable, try stacking pillows under your chest.  ? Your hips should stay down, against the surface that you are lying on. Keep your hip and back muscles relaxed.  4. Hold for __________ seconds.  5. Slowly relax your upper body and return to the starting position.  Repeat __________ times. Complete this exercise __________ times a day.  Strengthening exercises  These  exercises build strength and endurance in your back. Endurance is the ability to use your muscles for a long time, even after they get tired.  Exercise C: Pelvic tilt   1. Lie on your back on a firm surface. Bend your knees and keep your feet flat.  2. Tense your abdominal muscles. Tip your pelvis up toward the ceiling and flatten your lower back into the floor.  ? To help with this exercise, you may place a small towel under your lower back and try to push your back into the towel.  3. Hold for __________ seconds.  4. Let your muscles relax completely before you repeat this exercise.  Repeat __________ times. Complete this exercise __________ times a day.  Exercise D: Alternating arm and leg raises     1. Get on your hands and knees on a firm surface. If you are on a hard floor, you may want to use padding to cushion your knees, such as an exercise mat.  2. Line up your arms and legs. Your hands should be below your shoulders, and your knees should be below your hips.  3. Lift your left leg behind you. At the same time, raise your right arm and straighten it in front of you.  ? Do not lift your leg higher than your hip.  ? Do   not lift your arm higher than your shoulder.  ? Keep your abdominal and back muscles tight.  ? Keep your hips facing the ground.  ? Do not arch your back.  ? Keep your balance carefully, and do not hold your breath.  4. Hold for __________ seconds.  5. Slowly return to the starting position and repeat with your right leg and your left arm.  Repeat __________ times. Complete this exercise __________ times a day.  Exercise E: Abdominal set with straight leg raise     1. Lie on your back on a firm surface.  2. Bend one of your knees and keep your other leg straight.  3. Tense your abdominal muscles and lift your straight leg up, 4-6 inches (10-15 cm) off the ground.  4. Keep your abdominal muscles tight and hold for __________ seconds.  ? Do not hold your breath.  ? Do not arch your back. Keep it  flat against the ground.  5. Keep your abdominal muscles tense as you slowly lower your leg back to the starting position.  6. Repeat with your other leg.  Repeat __________ times. Complete this exercise __________ times a day.  Posture and body mechanics     Body mechanics refers to the movements and positions of your body while you do your daily activities. Posture is part of body mechanics. Good posture and healthy body mechanics can help to relieve stress in your body's tissues and joints. Good posture means that your spine is in its natural S-curve position (your spine is neutral), your shoulders are pulled back slightly, and your head is not tipped forward. The following are general guidelines for applying improved posture and body mechanics to your everyday activities.  Standing     · When standing, keep your spine neutral and your feet about hip-width apart. Keep a slight bend in your knees. Your ears, shoulders, and hips should line up.  · When you do a task in which you stand in one place for a long time, place one foot up on a stable object that is 2-4 inches (5-10 cm) high, such as a footstool. This helps keep your spine neutral.  Sitting     · When sitting, keep your spine neutral and keep your feet flat on the floor. Use a footrest, if necessary, and keep your thighs parallel to the floor. Avoid rounding your shoulders, and avoid tilting your head forward.  · When working at a desk or a computer, keep your desk at a height where your hands are slightly lower than your elbows. Slide your chair under your desk so you are close enough to maintain good posture.  · When working at a computer, place your monitor at a height where you are looking straight ahead and you do not have to tilt your head forward or downward to look at the screen.  Resting     · When lying down and resting, avoid positions that are most painful for you.  · If you have pain with activities such as sitting, bending, stooping, or  squatting (flexion-based activities), lie in a position in which your body does not bend very much. For example, avoid curling up on your side with your arms and knees near your chest (fetal position).  · If you have pain with activities such as standing for a long time or reaching with your arms (extension-based activities), lie with your spine in a neutral position and bend your knees slightly. Try the following   positions:  · Lying on your side with a pillow between your knees.  · Lying on your back with a pillow under your knees.  Lifting     · When lifting objects, keep your feet at least shoulder-width apart and tighten your abdominal muscles.  · Bend your knees and hips and keep your spine neutral. It is important to lift using the strength of your legs, not your back. Do not lock your knees straight out.  · Always ask for help to lift heavy or awkward objects.  This information is not intended to replace advice given to you by your health care provider. Make sure you discuss any questions you have with your health care provider.  Document Released: 12/26/2004 Document Revised: 09/02/2015 Document Reviewed: 10/07/2014  Elsevier Interactive Patient Education © 2017 [employer]

## 2015-12-01 ENCOUNTER — Telehealth: Payer: Self-pay | Admitting: Physical Medicine & Rehabilitation

## 2015-12-01 DIAGNOSIS — M41125 Adolescent idiopathic scoliosis, thoracolumbar region: Secondary | ICD-10-CM

## 2015-12-01 DIAGNOSIS — M533 Sacrococcygeal disorders, not elsewhere classified: Secondary | ICD-10-CM

## 2015-12-01 NOTE — Telephone Encounter (Signed)
Please let Terry Harrington know that the lumbar and coccygeal xrays only showed his substantial scoliosis. There are no occult fractures of the sacrum/coccyx apparent on the xray. Can proceed with PT[facility] if he would like.

## 2015-12-01 NOTE — Telephone Encounter (Signed)
I spoke with Mr Terry Harrington and he would like to go to the [facility] ([address] [place]) outpt therapy

## 2015-12-01 NOTE — Telephone Encounter (Signed)
rx written

## 2015-12-14 ENCOUNTER — Telehealth: Payer: Self-pay | Admitting: Physical Therapy

## 2015-12-14 NOTE — Telephone Encounter (Signed)
12/06/15 & 12/13/15 left message to call to schedule PT, no return call

## 2016-01-06 ENCOUNTER — Other Ambulatory Visit: Payer: Self-pay

## 2016-01-06 MED ORDER — SITAGLIPTIN PHOSPHATE 100 MG PO TABS: 100.0000 mg | ORAL_TABLET | Freq: Every day | ORAL | 1 refills | Status: DC

## 2016-01-11 ENCOUNTER — Ambulatory Visit: Payer: BLUE CROSS/BLUE SHIELD | Attending: Physical Medicine & Rehabilitation | Admitting: Physical Therapy

## 2016-01-11 ENCOUNTER — Encounter
Payer: BLUE CROSS/BLUE SHIELD | Attending: Physical Medicine & Rehabilitation | Admitting: Physical Medicine & Rehabilitation

## 2016-01-11 ENCOUNTER — Encounter: Payer: Self-pay | Admitting: Physical Medicine & Rehabilitation

## 2016-01-11 VITALS — BP 170/78 | HR 74 | Resp 16

## 2016-01-11 DIAGNOSIS — Z87891 Personal history of nicotine dependence: Secondary | ICD-10-CM | POA: Insufficient documentation

## 2016-01-11 DIAGNOSIS — E039 Hypothyroidism, unspecified: Secondary | ICD-10-CM | POA: Insufficient documentation

## 2016-01-11 DIAGNOSIS — M533 Sacrococcygeal disorders, not elsewhere classified: Secondary | ICD-10-CM | POA: Insufficient documentation

## 2016-01-11 DIAGNOSIS — M4106 Infantile idiopathic scoliosis, lumbar region: Secondary | ICD-10-CM | POA: Diagnosis not present

## 2016-01-11 DIAGNOSIS — I10 Essential (primary) hypertension: Secondary | ICD-10-CM | POA: Insufficient documentation

## 2016-01-11 DIAGNOSIS — M4105 Infantile idiopathic scoliosis, thoracolumbar region: Secondary | ICD-10-CM | POA: Diagnosis not present

## 2016-01-11 DIAGNOSIS — R42 Dizziness and giddiness: Secondary | ICD-10-CM | POA: Insufficient documentation

## 2016-01-11 DIAGNOSIS — E119 Type 2 diabetes mellitus without complications: Secondary | ICD-10-CM | POA: Diagnosis not present

## 2016-01-11 DIAGNOSIS — E785 Hyperlipidemia, unspecified: Secondary | ICD-10-CM | POA: Diagnosis not present

## 2016-01-11 NOTE — Patient Instructions (Signed)
PLEASE CALL ME WITH ANY PROBLEMS OR QUESTIONS [phone])   LET ME KNOW IF WE NEED TO MAKE A NEW REFERRAL FOR YOU  TAKE TIME TO STRETCH WHILE YOU WORK

## 2016-01-11 NOTE — Progress Notes (Signed)
Subjective:    Patient ID: Terry Harrington, male    DOB: 08/16/1955, 61 y.o.   MRN: [id number]  HPI  Terry Harrington is here in follow up of his back and coccygeal pain. He feels that the muscle relaxants and anti-inflammatories have helped. He tried to stop the robaxin and felt a difference. He hasn't started therapies because of a large co-pay at the [employer] [facility]. He is looking into therapy in [place] and waiting to hear back from that [facility] forces technical officerlocation's manager. He is doing some of the stretches I provided him and his work makes the staff stretch every day before they start.   He is using a foam cushion which helps while he's seated.   I reviewed his lumbar and pelvic films again which showed: Lower thoracic upper lumbar scoliosis concave left 29 degrees. Mid to lower lumbar scoliosis concave right 20 degrees. SI joints intact. Sacrum is intact. No focal bony abnormality identified.  Pain Inventory Average Pain 3 Pain Right Now 0 My pain is burning and stabbing  In the last 24 hours, has pain interfered with the following? General activity 2 Relation with others 0 Enjoyment of life 0 What TIME of day is your pain at its worst? varies with activity Sleep (in general) Fair  Pain is worse with: walking, bending, inactivity, standing and some activites Pain improves with: rest and medication Relief from Meds: 10  Mobility walk without assistance how many minutes can you walk? 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  yes  Physicians involved in your care Any changes since last visit?  yes   Family History  Problem Relation Age of Onset  . Hyperlipidemia Mother   . Hypertension Mother   . Stroke Father   . Kidney disease Father   . Diabetes Father   . Cancer Maternal Grandfather   . Anesthesia problems Neg Hx   . Hypotension Neg Hx   .  Malignant hyperthermia Neg Hx   . Pseudochol deficiency Neg Hx    Social History   Social History  . Marital status: Divorced    Spouse name: N/A  . Number of children: N/A  . Years of education: N/A   Occupational History  . Construction    Social History Main Topics  . Smoking status: Former Smoker    Packs/day: 1.50    Years: 26.00    Types: Cigarettes    Quit date: 03/02/2008  . Smokeless tobacco: Former NeurosurgeonUser    Quit date: 05/25/2008  . Alcohol use 0.0 oz/week     Comment: rarely  . Drug use: No  . Sexual activity: Not Currently   Other Topics Concern  . None   Social History Narrative  . None   Past Surgical History:  Procedure Laterality Date  . DENTAL SURGERY     several dental procedure approx 10 between 1999-2012  . HERNIA REPAIR     as a child  . SHOULDER ARTHROSCOPY  05/31/2011   Procedure: ARTHROSCOPY SHOULDER;  Surgeon: Kennieth RadArthur F Carter, MD;  [facility];  Service: Orthopedics;  Laterality: Left;  Shoulder Acromioplasty Left  . SHOULDER SURGERY  05/31/2011    impingment left   Past Medical History:  Diagnosis Date  . Diabetes mellitus   . GERD (gastroesophageal reflux disease)   . Heart murmur   . Hyperlipidemia   . Hypertension  on meds  . Hypothyroidism    on meds   BP (!) 170/78   Pulse 74   Resp 16   SpO2 98%   Opioid Risk Score:   Fall Risk Score:  `1  Depression screen PHQ 2/9  Depression screen [facility] 2/9 11/30/2015 01/23/2014 04/18/2013 04/18/2013  Decreased Interest 0 0 0 0  Down, Depressed, Hopeless 0 0 0 0  PHQ - 2 Score 0 0 0 0  Altered sleeping 3 - - -  Tired, decreased energy 1 - - -  Change in appetite 0 - - -  Feeling bad or failure about yourself  0 - - -  Trouble concentrating 0 - - -  Moving slowly or fidgety/restless 0 - - -  Suicidal thoughts 0 - - -  PHQ-9 Score 4 - - -    Review of Systems  Constitutional: Negative.   HENT: Negative.   Eyes: Negative.   Respiratory: Negative.   Cardiovascular: Negative.    Gastrointestinal: Negative.   Endocrine:       High blood sugar  Genitourinary: Negative.   Musculoskeletal: Positive for back pain.  Neurological: Negative.   Hematological: Negative.   Psychiatric/Behavioral: Negative.   All other systems reviewed and are negative.      Objective:   Physical Exam  General: Alert and oriented x 3, No apparent distress. Normal build.  HEENT: Head is normocephalic, atraumatic, PERRLA, EOMI, sclera anicteric, oral mucosa pink and moist, dentition intact, ext ear canals clear,  Neck: Supple without JVD or lymphadenopathy Heart: RRR Chest: Clear, no distress Abdomen: Soft, non-tender, non-distended, bowel sounds positive. Extremities: No clubbing, cyanosis, or edema. Pulses are 2+ Skin: Clean and intact without signs of breakdown Neuro: Pt is cognitively appropriate with normal insight, memory, and awareness. Cranial nerves 2-12 are intact. Sensory exam is normal except for mild sensory loss in both feet. Reflexes are 2+ in all 4's. Fine motor coordination is intact. No tremors. Motor function is grossly 5/5.  Musculoskeletal: dextroscoliosis apex at T12, L1 20 degrees persists. Right paraspinals are still tight along curve. He also has pain along gluteal area right more than left. Coccyx no longer painful to touch. He is able to flex to   90+ degrees with minimal pain today. ongoing tightness in his hamstrings, hip rotators. He had difficulty with straight leg raise and FABER due to muscle tightness. Right hemipelvis remains slightly elevated. His gait is fairly well compensated. Has nornal balance Full ROM, No pain with AROM or PROM in the neck, trunk, or extremities. Posture appropriate Psych: Pt's affect is appropriate. Pt is cooperative. pleasant         Assessment & Plan:  1. Coccydynia: no apparent trauma, but may be a cumulative effect of his chronic sitting/bouncing at work in addition to impaired posture/mechanics---improved today 2.  Juvenile Idiopathic Scoliosis.  3. DM2    Plan:  1. Continue with HEP; referral pending to outpt therapies. He will call if we need to re-submit 2. For spasm, continue robaxin 500mg  q6 prn 3. Continue scheduled diclofenac 75mg  BID with food for now.   4. Needs to take breaks to stretch while at work.  5. Continue using a foam/gel cushion while sitting at work in his seat 6. Fifty minutes of face to face patient care time were spent during this visit. All questions were encouraged and answered.

## 2016-02-04 ENCOUNTER — Encounter: Payer: Self-pay | Admitting: Rehabilitative and Restorative Service Providers"

## 2016-02-04 ENCOUNTER — Ambulatory Visit (INDEPENDENT_AMBULATORY_CARE_PROVIDER_SITE_OTHER): Payer: BLUE CROSS/BLUE SHIELD | Admitting: Rehabilitative and Restorative Service Providers"

## 2016-02-04 DIAGNOSIS — R29898 Other symptoms and signs involving the musculoskeletal system: Secondary | ICD-10-CM | POA: Diagnosis not present

## 2016-02-04 DIAGNOSIS — M545 Low back pain: Secondary | ICD-10-CM | POA: Diagnosis not present

## 2016-02-04 DIAGNOSIS — G8929 Other chronic pain: Secondary | ICD-10-CM | POA: Diagnosis not present

## 2016-02-04 NOTE — Patient Instructions (Signed)
Lying on stomach Grab buttocks and pull apart Hold while you tighten the buttocks  Hold 10 sec repeat 5-10 times  Trunk: Prone Extension (Press-Ups)    Lie on stomach on firm, flat surface. Relax bottom and legs. Raise chest in air with elbows straight. Keep hips flat on surface, sag stomach. Hold __2-3__ seconds. Repeat _10___ times. Do __2-3 __ sessions per day. CAUTION: Movement should be gentle and slow.    HIP: Hamstrings - Supine   Place strap around foot. Raise leg up, keeping knee straight.  Bend opposite knee to protect back if indicated. Hold 30 seconds. 3 reps per set, 2-3 sets per day     Outer Hip Stretch: Reclined IT Band Stretch (Strap)   Strap around one foot, pull leg across body until you feel a pull or stretch, with shoulders on mat. Hold for 30 seconds. Repeat 3 times each leg. 2-3 times/day.  Piriformis Stretch   Lying on back, pull right knee toward opposite shoulder. Hold 30 seconds. Repeat 3 times. Do 2-3 sessions per day.   Quads / HF, Supine   Lie near edge of bed, pull both knees up toward chest. Hold one knee as you drop the other leg off the edge of the bed.  Relax hanging knee/can bend knee back if indicated. Hold 30 seconds. Repeat 3 times per session. Do 2-3 sessions per day.    Quads / HF, Prone   Lie face down. Grasp one ankle with same-side hand. Use towel if needed to reach. Gently pull foot toward buttock.  Hold 30 seconds. Repeat 3 times per session. Do 2-3 sessions per day.  TENS UNIT: This is helpful for muscle pain and spasm.   Search and Purchase a TENS 7000 2nd edition at www.tenspros.com. It should be less than $30.     TENS unit instructions: Do not shower or bathe with the unit on Turn the unit off before removing electrodes or batteries If the electrodes lose stickiness add a drop of water to the electrodes after they are disconnected from the unit and place on plastic sheet. If you continued to have  difficulty, call the TENS unit company to purchase more electrodes. Do not apply lotion on the skin area prior to use. Make sure the skin is clean and dry as this will help prolong the life of the electrodes. After use, always check skin for unusual red areas, rash or other skin difficulties. If there are any skin problems, does not apply electrodes to the same area. Never remove the electrodes from the unit by pulling the wires. Do not use the TENS unit or electrodes other than as directed. Do not change electrode placement without consultating your therapist or physician. Keep 2 fingers with between each electrode.   Sleeping on Back  Place pillow under knees. A pillow with cervical support and a roll around waist are also helpful. Copyright  VHI. All rights reserved.  Sleeping on Side Place pillow between knees. Use cervical support under neck and a roll around waist as needed. Copyright  VHI. All rights reserved.   Sleeping on Stomach   If this is the only desirable sleeping position, place pillow under lower legs, and under stomach or chest as needed.  Posture - Sitting   Sit upright, head facing forward. Try using a roll to support lower back. Keep shoulders relaxed, and avoid rounded back. Keep hips level with knees. Avoid crossing legs for long periods. Stand to Sit / Sit to Stand  To sit: Bend knees to lower self onto front edge of chair, then scoot back on seat. To stand: Reverse sequence by placing one foot forward, and scoot to front of seat. Use rocking motion to stand up.   Work Height and Reach  Ideal work height is no more than 2 to 4 inches below elbow level when standing, and at elbow level when sitting. Reaching should be limited to arm's length, with elbows slightly bent.  Bending  Bend at hips and knees, not back. Keep feet shoulder-width apart.    Posture - Standing   Good posture is important. Avoid slouching and forward head thrust. Maintain curve  in low back and align ears over shoul- ders, hips over ankles.  Alternating Positions   Alternate tasks and change positions frequently to reduce fatigue and muscle tension. Take rest breaks. Computer Work   Position work to Art gallery manager. Use proper work and seat height. Keep shoulders back and down, wrists straight, and elbows at right angles. Use chair that provides full back support. Add footrest and lumbar roll as needed.  Getting Into / Out of Car  Lower self onto seat, scoot back, then bring in one leg at a time. Reverse sequence to get out.  Dressing  Lie on back to pull socks or slacks over feet, or sit and bend leg while keeping back straight.    Housework - Sink  Place one foot on ledge of cabinet under sink when standing at sink for prolonged periods.   Pushing / Pulling  Pushing is preferable to pulling. Keep back in proper alignment, and use leg muscles to do the work.  Deep Squat   Squat and lift with both arms held against upper trunk. Tighten stomach muscles without holding breath. Use smooth movements to avoid jerking.  Avoid Twisting   Avoid twisting or bending back. Pivot around using foot movements, and bend at knees if needed when reaching for articles.  Carrying Luggage   Distribute weight evenly on both sides. Use a cart whenever possible. Do not twist trunk. Move body as a unit.   Lifting Principles .Maintain proper posture and head alignment. .Slide object as close as possible before lifting. .Move obstacles out of the way. .Test before lifting; ask for help if too heavy. .Tighten stomach muscles without holding breath. .Use smooth movements; do not jerk. .Use legs to do the work, and pivot with feet. .Distribute the work load symmetrically and close to the center of trunk. .Push instead of pull whenever possible.   Ask For Help   Ask for help and delegate to others when possible. Coordinate your movements when lifting together, and  maintain the low back curve.  Log Roll   Lying on back, bend left knee and place left arm across chest. Roll all in one movement to the right. Reverse to roll to the left. Always move as one unit. Housework - Sweeping  Use long-handled equipment to avoid stooping.   Housework - Wiping  Position yourself as close as possible to reach work surface. Avoid straining your back.  Laundry - Unloading Wash   To unload small items at bottom of washer, lift leg opposite to arm being used to reach.  Gardening - Raking  Move close to area to be raked. Use arm movements to do the work. Keep back straight and avoid twisting.     Cart  When reaching into cart with one arm, lift opposite leg to keep back straight.   Getting Into /  Out of Bed  Lower self to lie down on one side by raising legs and lowering head at the same time. Use arms to assist moving without twisting. Bend both knees to roll onto back if desired. To sit up, start from lying on side, and use same move-ments in reverse. Housework - Vacuuming  Hold the vacuum with arm held at side. Step back and forth to move it, keeping head up. Avoid twisting.   Laundry - Armed forces training and education officerLoading Wash  Position laundry basket so that bending and twisting can be avoided.   Laundry - Unloading Dryer  Squat down to reach into clothes dryer or use a reacher.  Gardening - Weeding / Psychiatric nurselanting  Squat or Kneel. Knee pads may be helpful.

## 2016-02-04 NOTE — Therapy (Addendum)
[facility] [address] [place], [place], [address] Phone: [phone]   Fax:  [phone]  Physical Therapy Evaluation  Patient Details  Name: Terry Harrington MRN: [id number] Date of Birth: 04-Mar-1955 Referring Provider: Dr Faith Rogue  Encounter Date: 02/04/2016      PT End of Session - 02/04/16 1246    Visit Number 1   Number of Visits 12   Date for PT Re-Evaluation 03/17/16   PT Start Time 1148   PT Stop Time 1253   PT Time Calculation (min) 65 min   Activity Tolerance Patient tolerated treatment well      Past Medical History:  Diagnosis Date  . Diabetes mellitus   . GERD (gastroesophageal reflux disease)   . Heart murmur   . Hyperlipidemia   . Hypertension    on meds  . Hypothyroidism    on meds    Past Surgical History:  Procedure Laterality Date  . DENTAL SURGERY     several dental procedure approx 10 between 1999-2012  . HERNIA REPAIR     as a child  . SHOULDER ARTHROSCOPY  05/31/2011   Procedure: ARTHROSCOPY SHOULDER;  Surgeon: Kennieth Rad, MD;  Location: [facility] OR;  Service: Orthopedics;  Laterality: Left;  Shoulder Acromioplasty Left  . SHOULDER SURGERY  05/31/2011    impingment left    There were no vitals filed for this visit.       Subjective Assessment - 02/04/16 1155    Subjective Othal reports that he has had LB and Rt hip for about a year. He was using an exercise bike and had coccyx pain that was severe. Symptoms have improved some but he continus to have low back and Rt hip pain on an intermittent basis which is with any standing or walking. Symptoms have been treated with medication.    Pertinent History scoliosis; Lt shoulder sx for bone spur ~4 yrs ago   How long can you sit comfortably? 1-2 hours   How long can you stand comfortably? 5 min    How long can you walk comfortably? 5 min    Diagnostic tests xrays    Patient Stated Goals get rid of the pain and get his  endurance for walking and standing    Currently in Pain? Yes   Pain Score 4    Pain Location Back   Pain Orientation Lower;Right;Left   Pain Descriptors / Indicators Sharp;Burning   Pain Type Chronic pain   Pain Radiating Towards into the posterior hip just in the hip in the muscle    Pain Onset More than a month ago   Pain Frequency Intermittent   Aggravating Factors  standing and especially walking    Pain Relieving Factors sitting and especially lying down             [facility] PT Assessment - 02/04/16 0001      Assessment   Medical Diagnosis Low back and Rt hip pain    Referring Provider Dr Faith Rogue   Onset Date/Surgical Date 01/24/16   Hand Dominance Right   Next MD Visit 03/01/16   Prior Therapy none     Precautions   Precautions None     Balance Screen   Has the patient fallen in the past 6 months No   Has the patient had a decrease in activity level because of a fear of falling?  No   Is the patient reluctant to leave their home because of a  fear of falling?  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; walking up and down inclines; lifting up to ~ 30 pounds 2 man lift to 75 pounds - 38 years    Leisure yard work; Building services engineer; walking; was riding Metallurgist bike until injury      Observation/Other Assessments   Focus on Therapeutic Outcomes (FOTO)  39% limitation      Sensation   Additional Comments WNL's except buttocks Rt > Lt - numbness on an intermittent basis worse with increased pain/more walking      Posture/Postural Control   Posture Comments increased lumbar lordosis; scoliosis curvature     AROM   Overall AROM Comments tight bilat hips in flex/ext/rotation    Lumbar Flexion 80%   Lumbar Extension 55%   Lumbar - Right  Side Bend 75%   Lumbar - Left Side Bend 70%   Lumbar - Right Rotation 60%   Lumbar - Left Rotation 60%     Strength   Overall Strength Comments 5/5 bilat LE's      Flexibility   Hamstrings Rt 60 deg; Lt 65 deg    Quadriceps tight Rt 95 deg; Lt 100 deg    ITB tight bilat    Piriformis tigth bilat Rt > Lt      Palpation   Spinal mobility tight CPA L3/4 UPA L4/5 Rt   coccyx    Palpation comment tight bilat lumbar paraspinals; QL; hip abductors/extensors  Rt > Lt      Special Tests    Special Tests --  SLR/Fabers (-)                    [facility] Adult PT Treatment/Exercise - 02/04/16 0001      Self-Care   Self-Care --  back ed/posture handout     Lumbar Exercises: Stretches   Passive Hamstring Stretch 3 reps;30 seconds   Hip Flexor Stretch 3 reps;30 seconds  both knees to chest drop one LE off edge of table    Press Ups --  2-3 sec x 10    Quad Stretch 3 reps;30 seconds  prone with strap    ITB Stretch 3 reps;30 seconds   Piriformis Stretch 3 reps;30 seconds     Moist Heat Therapy   Number Minutes Moist Heat 29 Minutes   Moist Heat Location Lumbar Spine     Electrical Stimulation   Electrical Stimulation Location Rt lumbar paraspinals; Rt buttock   Electrical Stimulation Action IFC   Electrical Stimulation Parameters to tolerance   Electrical Stimulation Goals Pain;Tone                PT Education - 02/04/16 1232    Education provided Yes   Education Details HEP; TENS; body mechanics    Person(s) Educated Patient   Methods Explanation;Demonstration;Tactile cues;Verbal cues;Handout   Comprehension Verbalized understanding;Returned demonstration;Verbal cues required;Tactile cues required             PT Long Term Goals - 02/04/16 1252      PT LONG TERM GOAL #1   Title Improve tissue extensibility and ROM bilat LE's with patient to demonstrate increased ROM with assessment 03/17/16   Time 6   Period Weeks   Status New  PT LONG TERM  GOAL #2   Title Decrease pain with standing and walking with patient to report ability to stand and walk for 20-30 min with 0/10 to 1/10 pain at most 03/17/16   Time 6   Period Weeks   Status New     PT LONG TERM GOAL #3   Title Patient reports reductation of pain by 50-75% allowing him to work with greater ease and less pain 03/17/16   Time 6   Period Weeks   Status New     PT LONG TERM GOAL #4   Title Independent in HEP 03/17/16   Time 6   Period Weeks   Status New     PT LONG TERM GOAL #5   Title Improve FOTO to </= 35% limitation 03/17/16   Time 6   Period Weeks   Status New               Plan - 02/04/16 1247    Clinical Impression Statement Casimiro NeedleMichael presents with ~ one year history of coccyx and low back pain as well as Rt > Lt hip pain. He has significant tightness through bilat hips and LE's; pain with palpation through the coccyx/lumbar spine/Rt hip abductors; pain with functional activities including standing and walking. Patient will benefit form PT to address problems identified. He may benefit from evaluation and treatment specifically for coccydnia from a pelvic floor specialist if he does not respond to treatment.    Rehab Potential Good   PT Frequency 2x / week   PT Duration 6 weeks   PT Treatment/Interventions Patient/family education;ADLs/Self Care Home Management;Cryotherapy;Electrical Stimulation;Iontophoresis 4mg /ml Dexamethasone;Moist Heat;Ultrasound;Dry needling;Manual techniques;Therapeutic activities;Therapeutic exercise   PT Next Visit Plan LE stretching in sitting and standing; deep tissue work through the LB and Rt > Lt hip; modalities as indicated; progress to core stabilization as tissue is looser and mobility improved; further evaluation of coccyx pain as indicated   Consulted and Agree with Plan of Care Patient      Patient will benefit from skilled therapeutic intervention in order to improve the following deficits and impairments:  Improper body  mechanics, Pain, Increased fascial restricitons, Increased muscle spasms, Decreased endurance, Decreased activity tolerance  Visit Diagnosis: Chronic bilateral low back pain without sciatica - Plan: PT plan of care cert/re-cert  Other symptoms and signs involving the musculoskeletal system - Plan: PT plan of care cert/re-cert     Problem List Patient Active Problem List   Diagnosis Date Noted  . Idiopathic scoliosis 11/30/2015  . Coccydynia 06/16/2015  . Middle insomnia 02/18/2015  . Nonspecific abnormal electrocardiogram (ECG) (EKG) 07/27/2014  . Snoring 03/13/2014  . Hyperlipidemia with target LDL less than 100 06/20/2013  . Hypothyroidism 06/20/2013  . Personal history of colonic polyps 06/20/2013  . Type II diabetes mellitus with manifestations (HCC) 03/22/2013  . HTN (hypertension) 03/22/2013    Yuliya Nova Rober MinionP Susan Bleich PT, MPH  02/04/2016, 1:03 PM  [facility] [address] [place], [place], [address] Phone: [phone]   Fax:  [phone]  Name: Wyn QuakerMichael V Sobh MRN: [id number] Date of Birth: 10/04/1955

## 2016-02-10 ENCOUNTER — Ambulatory Visit (INDEPENDENT_AMBULATORY_CARE_PROVIDER_SITE_OTHER): Payer: BLUE CROSS/BLUE SHIELD | Admitting: Physical Therapy

## 2016-02-10 DIAGNOSIS — R29898 Other symptoms and signs involving the musculoskeletal system: Secondary | ICD-10-CM | POA: Diagnosis not present

## 2016-02-10 DIAGNOSIS — G8929 Other chronic pain: Secondary | ICD-10-CM

## 2016-02-10 DIAGNOSIS — M545 Low back pain: Secondary | ICD-10-CM | POA: Diagnosis not present

## 2016-02-10 NOTE — Therapy (Signed)
[facility] [address] [place], [place], [address] Phone: [phone]   Fax:  [phone]  Physical Therapy Treatment  Patient Details  Name: Terry QuakerMichael V Kelson MRN: [id number] Date of Birth: 08/18/1955 Referring Provider: Dr Faith RogueZachary Swartz  Encounter Date: 02/10/2016      PT End of Session - 02/10/16 1646    Visit Number 2   Number of Visits 12   Date for PT Re-Evaluation 03/17/16   PT Start Time 1603   PT Stop Time 1643   PT Time Calculation (min) 40 min   Activity Tolerance Patient tolerated treatment well   Behavior During Therapy [facility] for tasks assessed/performed      Past Medical History:  Diagnosis Date  . Diabetes mellitus   . GERD (gastroesophageal reflux disease)   . Heart murmur   . Hyperlipidemia   . Hypertension    on meds  . Hypothyroidism    on meds    Past Surgical History:  Procedure Laterality Date  . DENTAL SURGERY     several dental procedure approx 10 between 1999-2012  . HERNIA REPAIR     as a child  . SHOULDER ARTHROSCOPY  05/31/2011   Procedure: ARTHROSCOPY SHOULDER;  Surgeon: Kennieth RadArthur F Carter, MD;  [facility];  Service: Orthopedics;  Laterality: Left;  Shoulder Acromioplasty Left  . SHOULDER SURGERY  05/31/2011    impingment left    There were no vitals filed for this visit.      Subjective Assessment - 02/10/16 1605    Subjective had a very busy day at work so he's having some pain. "It's not as much as it would normally be, usually it's an 8."  Got a TENS unit and wants to know how to use   Pertinent History scoliosis; Lt shoulder sx for bone spur ~4 yrs ago   Currently in Pain? Yes   Pain Score 4    Pain Location Back   Pain Orientation Lower;Left;Right   Pain Descriptors / Indicators Dull   Pain Type Chronic pain   Pain Onset More than a month ago   Pain Frequency Intermittent   Aggravating Factors  standing and walking   Pain Relieving Factors sitting and lying  down                         [facility] Adult PT Treatment/Exercise - 02/10/16 1606      Self-Care   Self-Care Other Self-Care Comments   Other Self-Care Comments  instructed in proper application of home TENS unit and proper use-pt returned demonstration     Lumbar Exercises: Stretches   Passive Hamstring Stretch 2 reps;30 seconds   Passive Hamstring Stretch Limitations bil; supine with strap   Hip Flexor Stretch 2 reps;30 seconds   Hip Flexor Stretch Limitations bil; supine with leg off mat   Press Ups Limitations 10x3 sec   Quad Stretch 2 reps;30 seconds   Quad Stretch Limitations bil; prone with strap   ITB Stretch 2 reps;30 seconds   ITB Stretch Limitations bil; supine with strap   Piriformis Stretch 2 reps;30 seconds   Piriformis Stretch Limitations bil     Lumbar Exercises: Aerobic   Stationary Bike NuStep L4x6 min     Electrical Stimulation   Electrical Stimulation Location Rt lumbar paraspinals; Rt buttock   Electrical Stimulation Action IFC with home TENS unit   Electrical Stimulation Parameters to tolerance   Electrical Stimulation Goals Pain;Tone  PT Education - 02/10/16 1646    Education provided Yes   Education Details application and use of home TENS unit   Person(s) Educated Patient   Methods Explanation;Demonstration   Comprehension Verbalized understanding;Returned demonstration             PT Long Term Goals - 02/04/16 1252      PT LONG TERM GOAL #1   Title Improve tissue extensibility and ROM bilat LE's with patient to demonstrate increased ROM with assessment 03/17/16   Time 6   Period Weeks   Status New     PT LONG TERM GOAL #2   Title Decrease pain with standing and walking with patient to report ability to stand and walk for 20-30 min with 0/10 to 1/10 pain at most 03/17/16   Time 6   Period Weeks   Status New     PT LONG TERM GOAL #3   Title Patient reports reductation of pain by 50-75% allowing him  to work with greater ease and less pain 03/17/16   Time 6   Period Weeks   Status New     PT LONG TERM GOAL #4   Title Independent in HEP 03/17/16   Time 6   Period Weeks   Status New     PT LONG TERM GOAL #5   Title Improve FOTO to </= 35% limitation 03/17/16   Time 6   Period Weeks   Status New               Plan - 02/10/16 1646    Clinical Impression Statement Session today focused on review of HEP and per pt request how to use home TENS unit.  Pt independent with stretching and reports pain is less "than it normally would be on a day like today." Demonstrated proper use of home TENS unit and will review PRN.  Will continue to benefit from PT to maximize function and decrease pain.   PT Treatment/Interventions Patient/family education;ADLs/Self Care Home Management;Cryotherapy;Electrical Stimulation;Iontophoresis 4mg /ml Dexamethasone;Moist Heat;Ultrasound;Dry needling;Manual techniques;Therapeutic activities;Therapeutic exercise   PT Next Visit Plan LE stretching in sitting and standing; deep tissue work through the LB and Rt > Lt hip; modalities as indicated; progress to core stabilization as tissue is looser and mobility improved; further evaluation of coccyx pain as indicated   Consulted and Agree with Plan of Care Patient      Patient will benefit from skilled therapeutic intervention in order to improve the following deficits and impairments:  Improper body mechanics, Pain, Increased fascial restricitons, Increased muscle spasms, Decreased endurance, Decreased activity tolerance  Visit Diagnosis: Chronic bilateral low back pain without sciatica  Other symptoms and signs involving the musculoskeletal system     Problem List Patient Active Problem List   Diagnosis Date Noted  . Idiopathic scoliosis 11/30/2015  . Coccydynia 06/16/2015  . Middle insomnia 02/18/2015  . Nonspecific abnormal electrocardiogram (ECG) (EKG) 07/27/2014  . Snoring 03/13/2014  .  Hyperlipidemia with target LDL less than 100 06/20/2013  . Hypothyroidism 06/20/2013  . Personal history of colonic polyps 06/20/2013  . Type II diabetes mellitus with manifestations (HCC) 03/22/2013  . HTN (hypertension) 03/22/2013      Clarita Crane, PT, DPT 02/10/16 4:49 PM    [facility] [address] [place], [place], [address] Phone: [phone]   Fax:  [phone]  Name: NAJEE MANNINEN MRN: [id number] Date of Birth: 03/16/1955

## 2016-02-17 ENCOUNTER — Ambulatory Visit (INDEPENDENT_AMBULATORY_CARE_PROVIDER_SITE_OTHER): Payer: BLUE CROSS/BLUE SHIELD | Admitting: Physical Therapy

## 2016-02-17 ENCOUNTER — Encounter: Payer: Self-pay | Admitting: Physical Therapy

## 2016-02-17 DIAGNOSIS — G8929 Other chronic pain: Secondary | ICD-10-CM

## 2016-02-17 DIAGNOSIS — R29898 Other symptoms and signs involving the musculoskeletal system: Secondary | ICD-10-CM

## 2016-02-17 DIAGNOSIS — M545 Low back pain: Secondary | ICD-10-CM

## 2016-02-17 NOTE — Patient Instructions (Addendum)
Abdominal Bracing With Pelvic Floor (Hook-Lying)   With neutral spine, tighten pelvic floor and abdominals. Hold 5 seconds. Repeat __10_ times. Do _every other__ times a day.  Hip External Rotation With Pillow: Transverse Plane Stability   One knee bent, one leg straight, on pillow. Slowly roll bent knee out. Be sure pelvis does not rotate. Do _15__ times. Restabilize pelvis. Repeat with other leg. Do _1-2__ sets, Terry Grip_every other__ times per day.  Bridging    Slowly raise buttocks from floor, keeping stomach tight. Repeat __15__ times per set. Do __1-2__ sets per session. Do __every other__ sessions per day.  Internal Hip Rotation (Prone)    Abdomen supported, bend left knee and slowly lower leg out until other hip starts to rise. Keep stomach tight. Repeat __15__ times per set. Do __1-2__ sets per session. Do __everyother__ sessions per day.  Copyright  VHI. All rights reserved.

## 2016-02-17 NOTE — Therapy (Signed)
[place], [place], [address] Phone: [phone]   Fax:  [phone]  Physical Therapy Treatment  Patient Details  Name: Terry Harrington MRN: [id number] Date of Birth: 09/26/1955 Referring Provider: Dr Alger Simons  Encounter Date: 02/17/2016      PT End of Session - 02/17/16 1647    Visit Number 3   Number of Visits 12   Date for PT Re-Evaluation 03/17/16   PT Start Time 1701   PT Stop Time 7262   PT Time Calculation (min) 42 min      Past Medical History:  Diagnosis Date  . Diabetes mellitus   . GERD (gastroesophageal reflux disease)   . Heart murmur   . Hyperlipidemia   . Hypertension    on meds  . Hypothyroidism    on meds    Past Surgical History:  Procedure Laterality Date  . DENTAL SURGERY     several dental procedure approx 10 between 1999-2012  . HERNIA REPAIR     as a child  . SHOULDER ARTHROSCOPY  05/31/2011   Procedure: ARTHROSCOPY SHOULDER;  Surgeon: Sharmon Revere, MD;  Location: [place];  Service: Orthopedics;  Laterality: Left;  Shoulder Acromioplasty Left  . SHOULDER SURGERY  05/31/2011    impingment left    There were no vitals filed for this visit.      Subjective Assessment - 02/17/16 1703    Subjective Terry Harrington reports he is about 50% improved since starting therapy   Patient Stated Goals get rid of the pain and get his endurance for walking and standing    Currently in Pain? Yes   Pain Score 1    Pain Location Back   Pain Orientation Left;Right   Pain Descriptors / Indicators Sharp   Pain Type Chronic pain   Aggravating Factors  standing, walking going up down ladders   Pain Relieving Factors sitting and lying down.                         [facility] Adult PT Treatment/Exercise - 02/17/16 0001      Lumbar Exercises: Stretches   Active Hamstring Stretch Limitations gastroc stretches at wall and on prostrech     Lumbar Exercises:  Aerobic   Stationary Bike NuStep L4x6 min     Lumbar Exercises: Supine   Ab Set 10 reps;5 seconds   Clam 15 reps  each side   Bridge 15 reps     Lumbar Exercises: Prone   Other Prone Lumbar Exercises pelvic press with 15 reps hip rotation     Manual Therapy   Manual Therapy Other (comment)   Other Manual Therapy stretching bilat hips IR/ER manually and with strap, supine and prone.                 PT Education - 02/17/16 1716    Education provided Yes   Education Details HEP progression   Person(s) Educated Patient   Methods Explanation;Demonstration;Handout   Comprehension Returned demonstration;Verbalized understanding             PT Long Term Goals - 02/17/16 1748      PT LONG TERM GOAL #1   Title Improve tissue extensibility and ROM bilat LE's with patient to demonstrate increased ROM with assessment 03/17/16   Status On-going     PT LONG TERM GOAL #2   Title Decrease pain with standing and walking with patient to report ability to  stand and walk for 20-30 min with 0/10 to 1/10 pain at most 03/17/16   Status On-going     PT LONG TERM GOAL #3   Title Patient reports reductation of pain by 50-75% allowing him to work with greater ease and less pain 03/17/16   Status Partially Met  50%     PT LONG TERM GOAL #4   Title Independent in HEP 03/17/16   Status On-going     PT LONG TERM GOAL #5   Title Improve FOTO to </= 35% limitation 03/17/16   Status On-going               Plan - 02/17/16 1747    Clinical Impression Statement Terry Harrington reports feeling 50% better since starting therapy.  He tolerated treatment well today and is using his home TENs as needed.  He reports a lot of relief from stretches. Initial strengthening started todau   Rehab Potential Good   PT Frequency 1x / week   PT Duration 6 weeks   PT Treatment/Interventions Patient/family education;ADLs/Self Care Home Management;Cryotherapy;Electrical Stimulation;Iontophoresis 68m/ml  Dexamethasone;Moist Heat;Ultrasound;Dry needling;Manual techniques;Therapeutic activities;Therapeutic exercise   PT Next Visit Plan progress hip flexibility and core stabilization   Consulted and Agree with Plan of Care Patient      Patient will benefit from skilled therapeutic intervention in order to improve the following deficits and impairments:  Improper body mechanics, Pain, Increased fascial restricitons, Increased muscle spasms, Decreased endurance, Decreased activity tolerance  Visit Diagnosis: Other symptoms and signs involving the musculoskeletal system  Chronic bilateral low back pain without sciatica     Problem List Patient Active Problem List   Diagnosis Date Noted  . Idiopathic scoliosis 11/30/2015  . Coccydynia 06/16/2015  . Middle insomnia 02/18/2015  . Nonspecific abnormal electrocardiogram (ECG) (EKG) 07/27/2014  . Snoring 03/13/2014  . Hyperlipidemia with target LDL less than 100 06/20/2013  . Hypothyroidism 06/20/2013  . Personal history of colonic polyps 06/20/2013  . Type II diabetes mellitus with manifestations ([place] 03/22/2013  . HTN (hypertension) 03/22/2013    SJeral PinchPT  02/17/2016, 5:50 PM  [facility] [address]Phone: [phone]  Fax:  [phone] Name: Terry LEMMONSMRN: [id number] of Birth: 1August 13, 1957

## 2016-02-18 ENCOUNTER — Encounter: Payer: Self-pay | Admitting: Rehabilitative and Restorative Service Providers"

## 2016-02-18 ENCOUNTER — Ambulatory Visit (INDEPENDENT_AMBULATORY_CARE_PROVIDER_SITE_OTHER): Payer: BLUE CROSS/BLUE SHIELD | Admitting: Rehabilitative and Restorative Service Providers"

## 2016-02-18 DIAGNOSIS — M545 Low back pain, unspecified: Secondary | ICD-10-CM

## 2016-02-18 DIAGNOSIS — G8929 Other chronic pain: Secondary | ICD-10-CM

## 2016-02-18 DIAGNOSIS — R29898 Other symptoms and signs involving the musculoskeletal system: Secondary | ICD-10-CM | POA: Diagnosis not present

## 2016-02-18 NOTE — Therapy (Signed)
[place], [place], [address] Phone: [phone]   Fax:  [phone]  Physical Therapy Treatment  Patient Details  Name: Terry Harrington MRN: [id number] Date of Birth: [birth year] Referring Provider: Dr Alger Simons  Encounter Date: 02/18/2016      PT End of Session - 02/18/16 1349    Visit Number 4   Number of Visits 12   Date for PT Re-Evaluation 03/17/16   PT Start Time 1350   PT Stop Time 1441   PT Time Calculation (min) 51 min   Activity Tolerance Patient tolerated treatment well      Past Medical History:  Diagnosis Date  . Diabetes mellitus   . GERD (gastroesophageal reflux disease)   . Heart murmur   . Hyperlipidemia   . Hypertension    on meds  . Hypothyroidism    on meds    Past Surgical History:  Procedure Laterality Date  . DENTAL SURGERY     several dental procedure approx 10 between 1999-2012  . HERNIA REPAIR     as a child  . SHOULDER ARTHROSCOPY  05/31/2011   Procedure: ARTHROSCOPY SHOULDER;  Surgeon: Sharmon Revere, MD;  Location: [place];  Service: Orthopedics;  Laterality: Left;  Shoulder Acromioplasty Left  . SHOULDER SURGERY  05/31/2011    impingment left    There were no vitals filed for this visit.      Subjective Assessment - 02/18/16 1350    Subjective Cotninues to improve. Working on his exercises at home. Pleased with progress.    Currently in Pain? Yes   Pain Score 1    Pain Location Back   Pain Orientation Left;Right   Pain Descriptors / Indicators Sharp   Pain Type Chronic pain   Pain Onset More than a month ago   Pain Frequency Intermittent                         [facility] Adult PT Treatment/Exercise - 02/18/16 0001      Therapeutic Activites    Therapeutic Activities --  myofacial ball release work lumbar spine      Lumbar Exercises: Stretches   Active Hamstring Stretch Limitations gastroc stretches at wall 20-30 sec x 2  and on prostrech 30 sec x 1 each LE    Passive Hamstring Stretch 3 reps;30 seconds  bilat w/strap    Hip Flexor Stretch 2 reps;30 seconds  knees to chest before stretch - LE off edge of table    Press Ups --  2-3 sec x 10 to pt tolerance   Quad Stretch 2 reps;30 seconds  prone with strap   Piriformis Stretch 2 reps;30 seconds  travell w/ strap    Piriformis Stretch Limitations knee to opposite shoudler 30 sec x 2      Lumbar Exercises: Aerobic   Stationary Bike NuStep L5x6 min     Lumbar Exercises: Supine   Ab Set 10 reps  10 sec hold   Clam 15 reps  each side alternating    Bent Knee Raise 10 reps  arm and opposite le alt x 10 w/ core engaged x 10    Bridge 15 reps     Lumbar Exercises: Prone   Other Prone Lumbar Exercises pelvic press with 15 reps hip rotation     Moist Heat Therapy   Number Minutes Moist Heat 20 Minutes   Moist Heat Location Lumbar Spine  Community education officer Action IFC    Electrical Stimulation Parameters to tolerance   Electrical Stimulation Goals Pain;Tone     Manual Therapy   Manual therapy comments pt prone    Joint Mobilization CPA mobs grade II/III lumbar    Soft tissue mobilization IASTM bilat lumbar to lower thoracic paraspinals/QL/lats    Other Manual Therapy stretching bilat hips IR/ER manually and with strap, supine and prone.                 PT Education - 02/17/16 1716    Education provided Yes   Education Details HEP progression   Person(s) Educated Patient   Methods Explanation;Demonstration;Handout   Comprehension Returned demonstration;Verbalized understanding             PT Long Term Goals - 02/17/16 1748      PT LONG TERM GOAL #1   Title Improve tissue extensibility and ROM bilat LE's with patient to demonstrate increased ROM with assessment 03/17/16   Status On-going     PT LONG TERM GOAL #2   Title Decrease pain  with standing and walking with patient to report ability to stand and walk for 20-30 min with 0/10 to 1/10 pain at most 03/17/16   Status On-going     PT LONG TERM GOAL #3   Title Patient reports reductation of pain by 50-75% allowing him to work with greater ease and less pain 03/17/16   Status Partially Met  50%     PT LONG TERM GOAL #4   Title Independent in HEP 03/17/16   Status On-going     PT LONG TERM GOAL #5   Title Improve FOTO to </= 35% limitation 03/17/16   Status On-going               Plan - 02/17/16 1747    Clinical Impression Statement Terry Harrington reports feeling 50% better since starting therapy.  He tolerated treatment well today and is using his home TENs as needed.  He reports a lot of relief from stretches. Initial strengthening started todau   Rehab Potential Good   PT Frequency 1x / week   PT Duration 6 weeks   PT Treatment/Interventions Patient/family education;ADLs/Self Care Home Management;Cryotherapy;Electrical Stimulation;Iontophoresis '4mg'$ /ml Dexamethasone;Moist Heat;Ultrasound;Dry needling;Manual techniques;Therapeutic activities;Therapeutic exercise   PT Next Visit Plan progress hip flexibility and core stabilization   Consulted and Agree with Plan of Care Patient      Patient will benefit from skilled therapeutic intervention in order to improve the following deficits and impairments:     Visit Diagnosis: Other symptoms and signs involving the musculoskeletal system  Chronic bilateral low back pain without sciatica     Problem List Patient Active Problem List   Diagnosis Date Noted  . Idiopathic scoliosis 11/30/2015  . Coccydynia 06/16/2015  . Middle insomnia 02/18/2015  . Nonspecific abnormal electrocardiogram (ECG) (EKG) 07/27/2014  . Snoring 03/13/2014  . Hyperlipidemia with target LDL less than 100 06/20/2013  . Hypothyroidism 06/20/2013  . Personal history of colonic polyps 06/20/2013  . Type II diabetes mellitus with manifestations  ([place]) 03/22/2013  . HTN (hypertension) 03/22/2013    Celyn Nilda Simmer  PT, MPH   02/18/2016, 2:49 PM  [facility] [place], [place], [address] Phone: [phone]   Fax:  [phone]  Name: Terry Harrington MRN: [id number] Date of Birth: [birth year]

## 2016-02-22 ENCOUNTER — Encounter: Payer: Self-pay | Admitting: Internal Medicine

## 2016-02-22 ENCOUNTER — Ambulatory Visit (INDEPENDENT_AMBULATORY_CARE_PROVIDER_SITE_OTHER): Payer: BLUE CROSS/BLUE SHIELD | Admitting: Internal Medicine

## 2016-02-22 VITALS — BP 138/70 | HR 80 | Temp 98.2°F | Ht 71.0 in | Wt 220.1 lb

## 2016-02-22 DIAGNOSIS — E039 Hypothyroidism, unspecified: Secondary | ICD-10-CM

## 2016-02-22 DIAGNOSIS — I10 Essential (primary) hypertension: Secondary | ICD-10-CM

## 2016-02-22 DIAGNOSIS — E118 Type 2 diabetes mellitus with unspecified complications: Secondary | ICD-10-CM

## 2016-02-22 NOTE — Progress Notes (Signed)
Subjective:  Patient ID: Terry Harrington, male    DOB: 02-Jan-1956  Age: 61 y.o. MRN: [id number]  CC: Hypertension; Hypothyroidism; and Diabetes   HPI Terry Harrington presents for f/up - he feels well and offers no complaints.  Outpatient Medications Prior to Visit  Medication Sig Dispense Refill  . cholecalciferol (VITAMIN D) 1000 UNITS tablet Take 2,000 Units by mouth daily.    . diclofenac (VOLTAREN) 75 MG EC tablet Take 1 tablet (75 mg total) by mouth 2 (two) times daily with a meal. 60 tablet 3  . levothyroxine (SYNTHROID, LEVOTHROID) 100 MCG tablet Take 1 tablet (100 mcg total) by mouth daily. 90 tablet 3  . meclizine (ANTIVERT) 25 MG tablet Take 1 tablet (25 mg total) by mouth 3 (three) times daily as needed for dizziness. 30 tablet 0  . methocarbamol (ROBAXIN) 500 MG tablet Take 1 tablet (500 mg total) by mouth every 6 (six) hours as needed for muscle spasms. 60 tablet 2  . Multiple Vitamin (MULITIVITAMIN WITH MINERALS) TABS Take 1 tablet by mouth daily.    . pioglitazone-metformin (ACTOPLUS MET) 15-850 MG tablet TAKE 2 TABLETS EVERY DAY WITH SUPPER 180 tablet 1  . pravastatin (PRAVACHOL) 40 MG tablet Take 1 tablet (40 mg total) by mouth daily. 90 tablet 3  . sitaGLIPtin (JANUVIA) 100 MG tablet Take 1 tablet (100 mg total) by mouth daily. 90 tablet 1  . TEKTURNA 150 MG tablet TAKE 1 TABLET (150 MG TOTAL) BY MOUTH DAILY. 90 tablet 1  . vitamin B-12 (CYANOCOBALAMIN) 1000 MCG tablet Take 2,000 mcg by mouth daily.     No facility-administered medications prior to visit.     ROS Review of Systems  Constitutional: Negative for appetite change, diaphoresis, fatigue and unexpected weight change.  HENT: Negative.   Eyes: Negative for visual disturbance.  Respiratory: Negative for cough, chest tightness, shortness of breath and wheezing.   Cardiovascular: Negative for chest pain, palpitations and leg swelling.  Gastrointestinal: Negative for abdominal pain, constipation, diarrhea,  nausea and vomiting.  Endocrine: Negative.  Negative for cold intolerance and heat intolerance.  Genitourinary: Negative.  Negative for difficulty urinating.  Musculoskeletal: Negative.  Negative for back pain, myalgias and neck pain.  Skin: Negative.  Negative for rash.  Allergic/Immunologic: Negative.   Neurological: Negative.   Hematological: Negative.  Negative for adenopathy. Does not bruise/bleed easily.  Psychiatric/Behavioral: Negative.     Objective:  BP 138/70 (BP Location: Left Arm, Patient Position: Sitting, Cuff Size: Normal)   Pulse 80   Temp 98.2 F (36.8 C) (Oral)   Ht '5\' 11"'$  (1.803 m)   Wt 220 lb 1.9 oz (99.8 kg)   SpO2 97%   BMI 30.70 kg/m   BP Readings from Last 3 Encounters:  02/22/16 138/70  01/11/16 (!) 170/78  11/30/15 132/70    Wt Readings from Last 3 Encounters:  02/22/16 220 lb 1.9 oz (99.8 kg)  11/30/15 222 lb (100.7 kg)  10/18/15 222 lb (100.7 kg)    Physical Exam  Constitutional: He is oriented to person, place, and time. No distress.  HENT:  Mouth/Throat: Oropharynx is clear and moist. No oropharyngeal exudate.  Eyes: Conjunctivae are normal. Right eye exhibits no discharge. Left eye exhibits no discharge. No scleral icterus.  Neck: Normal range of motion. Neck supple. No JVD present. No tracheal deviation present. No thyromegaly present.  Cardiovascular: Normal rate, normal heart sounds and intact distal pulses.  Exam reveals no gallop and no friction rub.   No murmur heard.  Pulmonary/Chest: Effort normal and breath sounds normal. No stridor. No respiratory distress. He has no wheezes. He has no rales. He exhibits no tenderness.  Abdominal: Soft. Bowel sounds are normal. He exhibits no distension and no mass. There is no tenderness. There is no rebound and no guarding.  Musculoskeletal: Normal range of motion. He exhibits no edema or tenderness.  Lymphadenopathy:    He has no cervical adenopathy.  Neurological: He is oriented to person,  place, and time.  Skin: Skin is warm and dry. No rash noted. He is not diaphoretic. No erythema. No pallor.  Vitals reviewed.   Lab Results  Component Value Date   WBC 5.6 02/18/2015   HGB 13.6 02/18/2015   HCT 41.4 02/18/2015   PLT 306.0 02/18/2015   GLUCOSE 116 (H) 06/16/2015   CHOL 145 09/22/2015   TRIG 71.0 09/22/2015   HDL 59.40 09/22/2015   LDLDIRECT 132.8 02/21/2013   LDLCALC 71 09/22/2015   ALT 11 06/16/2015   AST 12 06/16/2015   NA 138 06/16/2015   K 4.2 06/16/2015   CL 105 06/16/2015   CREATININE 1.32 06/16/2015   BUN 21 06/16/2015   CO2 27 06/16/2015   TSH 3.08 06/16/2015   PSA 0.09 (L) 02/21/2013   HGBA1C 6.6 11/30/2015   MICROALBUR <0.7 02/18/2015    Dg Lumbar Spine Complete W/bend  Result Date: 11/30/2015 CLINICAL DATA:  Back pain.  Scoliosis. EXAM: SACRUM AND COCCYX - 2+ VIEW; LUMBAR SPINE - COMPLETE WITH BENDING VIEWS COMPARISON:  CT 04/17/2003. FINDINGS: Lower thoracic upper lumbar scoliosis concave left 29 degrees. Mid to lower lumbar scoliosis concave right 20 degrees. SI joints intact. Sacrum is intact. No focal bony abnormality identified. IMPRESSION: 1. Thoracolumbar scoliosis as above. No acute or focal bony abnormality identified. 2. Aortoiliac atherosclerotic vascular disease. Electronically Signed   By: Marcello Moores  Register   On: 11/30/2015 10:59   Dg Sacrum/coccyx  Result Date: 11/30/2015 CLINICAL DATA:  Back pain.  Scoliosis. EXAM: SACRUM AND COCCYX - 2+ VIEW; LUMBAR SPINE - COMPLETE WITH BENDING VIEWS COMPARISON:  CT 04/17/2003. FINDINGS: Lower thoracic upper lumbar scoliosis concave left 29 degrees. Mid to lower lumbar scoliosis concave right 20 degrees. SI joints intact. Sacrum is intact. No focal bony abnormality identified. IMPRESSION: 1. Thoracolumbar scoliosis as above. No acute or focal bony abnormality identified. 2. Aortoiliac atherosclerotic vascular disease. Electronically Signed   By: Marcello Moores  Register   On: 11/30/2015 10:59    Assessment  & Plan:   Terry Harrington was seen today for hypertension, hypothyroidism and diabetes.  Diagnoses and all orders for this visit:  Essential hypertension- his blood pressure is adequately well controlled, I will monitor his electrolytes and renal function. -     CBC with Differential/Platelet; Future -     Basic metabolic panel; Future -     Urinalysis, Routine w reflex microscopic; Future  Acquired hypothyroidism- I will check his TSH and will adjust his levothyroxine dose if indicated. -     TSH; Future  Type 2 diabetes mellitus with complication, without long-term current use of insulin ([place])- will check his A1c and address if indicated. For now will continue the DPP4 inhibitor, pioglitazone, and metformin. -     Basic metabolic panel; Future -     Hemoglobin A1c; Future -     Microalbumin / creatinine urine ratio; Future   I am having Mr. Goodrich maintain his multivitamin with minerals, vitamin B-12, cholecalciferol, meclizine, pioglitazone-metformin, levothyroxine, pravastatin, TEKTURNA, diclofenac, methocarbamol, and sitaGLIPtin.  No orders of the  defined types were placed in this encounter.    Follow-up: Return in about 6 months (around 08/21/2016).  Scarlette Calico, MD

## 2016-02-22 NOTE — Patient Instructions (Signed)
Hypothyroidism Hypothyroidism is a disorder of the thyroid. The thyroid is a large gland that is located in the lower front of the neck. The thyroid releases hormones that control how the body works. With hypothyroidism, the thyroid does not make enough of these hormones. What are the causes? Causes of hypothyroidism may include:  Viral infections.  Pregnancy.  Your own defense system (immune system) attacking your thyroid.  Certain medicines.  Birth defects.  Past radiation treatments to your head or neck.  Past treatment with radioactive iodine.  Past surgical removal of part or all of your thyroid.  Problems with the gland that is located in the center of your brain (pituitary).  What are the signs or symptoms? Signs and symptoms of hypothyroidism may include:  Feeling as though you have no energy (lethargy).  Inability to tolerate cold.  Weight gain that is not explained by a change in diet or exercise habits.  Dry skin.  Coarse hair.  Menstrual irregularity.  Slowing of thought processes.  Constipation.  Sadness or depression.  How is this diagnosed? Your health care provider may diagnose hypothyroidism with blood tests and ultrasound tests. How is this treated? Hypothyroidism is treated with medicine that replaces the hormones that your body does not make. After you begin treatment, it may take several weeks for symptoms to go away. Follow these instructions at home:  Take medicines only as directed by your health care provider.  If you start taking any new medicines, tell your health care provider.  Keep all follow-up visits as directed by your health care provider. This is important. As your condition improves, your dosage needs may change. You will need to have blood tests regularly so that your health care provider can watch your condition. Contact a health care provider if:  Your symptoms do not get better with treatment.  You are taking thyroid  replacement medicine and: ? You sweat excessively. ? You have tremors. ? You feel anxious. ? You lose weight rapidly. ? You cannot tolerate heat. ? You have emotional swings. ? You have diarrhea. ? You feel weak. Get help right away if:  You develop chest pain.  You develop an irregular heartbeat.  You develop a rapid heartbeat. This information is not intended to replace advice given to you by your health care provider. Make sure you discuss any questions you have with your health care provider. Document Released: 12/26/2004 Document Revised: 06/03/2015 Document Reviewed: 05/13/2013 Elsevier Interactive Patient Education  2017 [employer]  

## 2016-02-22 NOTE — Progress Notes (Signed)
Pre visit review using our clinic review tool, if applicable. No additional management support is needed unless otherwise documented below in the visit note. 

## 2016-02-23 ENCOUNTER — Other Ambulatory Visit (INDEPENDENT_AMBULATORY_CARE_PROVIDER_SITE_OTHER): Payer: BLUE CROSS/BLUE SHIELD

## 2016-02-23 DIAGNOSIS — E118 Type 2 diabetes mellitus with unspecified complications: Secondary | ICD-10-CM

## 2016-02-23 DIAGNOSIS — E039 Hypothyroidism, unspecified: Secondary | ICD-10-CM | POA: Diagnosis not present

## 2016-02-23 DIAGNOSIS — I10 Essential (primary) hypertension: Secondary | ICD-10-CM | POA: Diagnosis not present

## 2016-02-23 LAB — CBC WITH DIFFERENTIAL/PLATELET
BASOS PCT: 0.6 % (ref 0.0–3.0)
Basophils Absolute: 0 10*3/uL (ref 0.0–0.1)
EOS ABS: 0.1 10*3/uL (ref 0.0–0.7)
Eosinophils Relative: 1 % (ref 0.0–5.0)
HCT: 39.1 % (ref 39.0–52.0)
HEMOGLOBIN: 13.4 g/dL (ref 13.0–17.0)
LYMPHS ABS: 2.6 10*3/uL (ref 0.7–4.0)
Lymphocytes Relative: 41.8 % (ref 12.0–46.0)
MCHC: 34.2 g/dL (ref 30.0–36.0)
MCV: 88.3 fl (ref 78.0–100.0)
MONO ABS: 0.3 10*3/uL (ref 0.1–1.0)
Monocytes Relative: 4.5 % (ref 3.0–12.0)
NEUTROS ABS: 3.2 10*3/uL (ref 1.4–7.7)
NEUTROS PCT: 52.1 % (ref 43.0–77.0)
PLATELETS: 311 10*3/uL (ref 150.0–400.0)
RBC: 4.43 Mil/uL (ref 4.22–5.81)
RDW: 13.7 % (ref 11.5–15.5)
WBC: 6.2 10*3/uL (ref 4.0–10.5)

## 2016-02-23 LAB — BASIC METABOLIC PANEL
BUN: 11 mg/dL (ref 6–23)
CO2: 27 mEq/L (ref 19–32)
Calcium: 9.5 mg/dL (ref 8.4–10.5)
Chloride: 107 mEq/L (ref 96–112)
Creatinine, Ser: 0.98 mg/dL (ref 0.40–1.50)
GFR: 99.56 mL/min (ref >60.00)
Glucose, Bld: 131 mg/dL — ABNORMAL HIGH (ref 70–99)
Potassium: 3.9 mEq/L (ref 3.5–5.1)
Sodium: 139 mEq/L (ref 135–145)

## 2016-02-23 LAB — URINALYSIS, ROUTINE W REFLEX MICROSCOPIC
Bilirubin Urine: NEGATIVE
Hgb urine dipstick: NEGATIVE
Ketones, ur: NEGATIVE
Leukocytes, UA: NEGATIVE
Nitrite: NEGATIVE
Specific Gravity, Urine: >=1.03 — AB (ref 1.000–1.030)
Urine Glucose: NEGATIVE
Urobilinogen, UA: 0.2 (ref 0.0–1.0)
pH: 5.5 (ref 5.0–8.0)

## 2016-02-23 LAB — MICROALBUMIN / CREATININE URINE RATIO
CREATININE, U: 331.8 mg/dL
MICROALB UR: 2.2 mg/dL — AB (ref 0.0–1.9)
MICROALB/CREAT RATIO: 0.7 mg/g (ref 0.0–30.0)

## 2016-02-23 LAB — TSH: TSH: 0.64 u[IU]/mL (ref 0.35–4.50)

## 2016-02-23 LAB — HEMOGLOBIN A1C: Hgb A1c MFr Bld: 7.4 % — ABNORMAL HIGH (ref 4.6–6.5)

## 2016-02-24 ENCOUNTER — Encounter: Payer: Self-pay | Admitting: Internal Medicine

## 2016-02-25 ENCOUNTER — Encounter: Payer: Self-pay | Admitting: Rehabilitative and Restorative Service Providers"

## 2016-02-25 ENCOUNTER — Ambulatory Visit (INDEPENDENT_AMBULATORY_CARE_PROVIDER_SITE_OTHER): Payer: BLUE CROSS/BLUE SHIELD | Admitting: Rehabilitative and Restorative Service Providers"

## 2016-02-25 DIAGNOSIS — M545 Low back pain, unspecified: Secondary | ICD-10-CM

## 2016-02-25 DIAGNOSIS — R29898 Other symptoms and signs involving the musculoskeletal system: Secondary | ICD-10-CM

## 2016-02-25 DIAGNOSIS — G8929 Other chronic pain: Secondary | ICD-10-CM

## 2016-02-25 NOTE — Patient Instructions (Addendum)
Strengthening: Hip Extension - Resisted    With tubing around right ankle, face anchor and pull leg straight back. Repeat _10_ times per set. Do __2-3__ sets per session. Do __1_ sessions per day.    Strengthening: Hip Abduction - Resisted    With tubing around right leg, other side toward anchor, extend leg out from side slightly behind body leading with heel. Repeat __10__ times per set. Do __2-3__ sets per session. Do _1___ sessions per day.  Trigger Point Dry Needling  . What is Trigger Point Dry Needling (DN)? o DN is a physical therapy technique used to treat muscle pain and dysfunction. Specifically, DN helps deactivate muscle trigger points (muscle knots).  o A thin filiform needle is used to penetrate the skin and stimulate the underlying trigger point. The goal is for a local twitch response (LTR) to occur and for the trigger point to relax. No medication of any kind is injected during the procedure.   . What Does Trigger Point Dry Needling Feel Like?  o The procedure feels different for each individual patient. Some patients report that they do not actually feel the needle enter the skin and overall the process is not painful. Very mild bleeding may occur. However, many patients feel a deep cramping in the muscle in which the needle was inserted. This is the local twitch response.   Marland Kitchen. How Will I feel after the treatment? o Soreness is normal, and the onset of soreness may not occur for a few hours. Typically this soreness does not last longer than two days.  o Bruising is uncommon, however; ice can be used to decrease any possible bruising.  o In rare cases feeling tired or nauseous after the treatment is normal. In addition, your symptoms may get worse before they get better, this period will typically not last longer than 24 hours.   . What Can I do After My Treatment? o Increase your hydration by drinking more water for the next 24 hours. o You may place ice or heat on  the areas treated that have become sore, however, do not use heat on inflamed or bruised areas. Heat often brings more relief post needling. o You can continue your regular activities, but vigorous activity is not recommended initially after the treatment for 24 hours. o DN is best combined with other physical therapy such as strengthening, stretching, and other therapies.

## 2016-02-25 NOTE — Therapy (Signed)
[facility] [place], [place], [address] Phone: [phone]   Fax:  [phone]  Physical Therapy Treatment  Patient Details  Name: Terry Harrington MRN: [id number] Date of Birth: [birth year] Referring Provider: Dr Alger Simons  Encounter Date: 02/25/2016      PT End of Session - 02/25/16 1401    Visit Number 5   Number of Visits 12   Date for PT Re-Evaluation 03/17/16   PT Start Time 6599   PT Stop Time 1450   PT Time Calculation (min) 51 min   Activity Tolerance Patient tolerated treatment well      Past Medical History:  Diagnosis Date  . Diabetes mellitus   . GERD (gastroesophageal reflux disease)   . Heart murmur   . Hyperlipidemia   . Hypertension    on meds  . Hypothyroidism    on meds    Past Surgical History:  Procedure Laterality Date  . DENTAL SURGERY     several dental procedure approx 10 between 1999-2012  . HERNIA REPAIR     as a child  . SHOULDER ARTHROSCOPY  05/31/2011   Procedure: ARTHROSCOPY SHOULDER;  Surgeon: Sharmon Revere, MD;  Location: [facility];  Service: Orthopedics;  Laterality: Left;  Shoulder Acromioplasty Left  . SHOULDER SURGERY  05/31/2011    impingment left    There were no vitals filed for this visit.      Subjective Assessment - 02/25/16 1402    Subjective Doing well until yesterday - stiffer and tighter. Did walk yesterday on some inclines. Could really "feel it". Still doing his stretches and exercises at home. Generally feeling better - about 50% improved since beginning therapy.    Currently in Pain? Yes   Pain Score 2    Pain Location Back   Pain Orientation Left;Right   Pain Descriptors / Indicators Tightness   Pain Type Chronic pain            [facility] PT Assessment - 02/25/16 0001      Assessment   Medical Diagnosis Low back and Rt hip pain    Referring Provider Dr Alger Simons   Onset Date/Surgical Date 01/24/16   Hand Dominance Right    Next MD Visit 03/01/16   Prior Therapy none     AROM   Overall AROM Comments tight bilat hips in flex/ext/rotation - improvement noted    Lumbar Flexion 85%   Lumbar Extension 60%   Lumbar - Right Side Bend 75%   Lumbar - Left Side Bend 75%   Lumbar - Right Rotation 60%   Lumbar - Left Rotation 60%     Strength   Overall Strength Comments 5/5 bilat LE's      Flexibility   Hamstrings Rt 70 deg; Lt 75 deg    Quadriceps tight Rt 100 deg; Lt 100 deg    ITB tight bilat    Piriformis tigth bilat Rt > Lt improving     Palpation   Spinal mobility tight CPA L3/4 UPA L4/5 Rt improving  coccyx    Palpation comment tight bilat lumbar paraspinals; QL; hip abductors/extensors  Rt > Lt                      [facility] Adult PT Treatment/Exercise - 02/25/16 0001      Lumbar Exercises: Stretches   Passive Hamstring Stretch 3 reps;30 seconds  bilat w/strap    Standing Extension 3 reps  3-5 sec hold  Press Ups --  2-3 sec x 10 to pt tolerance   Quad Stretch 2 reps;30 seconds  prone with strap   Piriformis Stretch 2 reps;30 seconds  travell w/ strap    Piriformis Stretch Limitations knee to opposite shoudler 30 sec x 2      Lumbar Exercises: Aerobic   Stationary Bike NuStep L5x6 min     Lumbar Exercises: Standing   Other Standing Lumbar Exercises hip extension x10    Other Standing Lumbar Exercises hip abduction x 10      Lumbar Exercises: Supine   Ab Set 10 reps  10 sec hold     Lumbar Exercises: Prone   Other Prone Lumbar Exercises pelvic press with 15 reps hip rotation     Moist Heat Therapy   Number Minutes Moist Heat 20 Minutes   Moist Heat Location Lumbar Spine     Electrical Stimulation   Electrical Stimulation Location bilat lumbar paraspinals    Electrical Stimulation Action IFC    Electrical Stimulation Parameters to tolerance   Electrical Stimulation Goals Pain;Tone     Manual Therapy   Manual therapy comments pt prone    Joint Mobilization CPA  mobs grade II/III lumbar    Soft tissue mobilization bilat hips - through hip abductors/piriformis    Other Manual Therapy stretching bilat hips IR/ER manually and with strap, supine and prone.           Trigger Point Dry Needling - 02/25/16 1423    Consent Given? Yes   Education Handout Provided Yes   Muscles Treated Upper Body --  glut medius - decreased tightness to palpation    Gluteus Maximus Response Palpable increased muscle length   Piriformis Response Palpable increased muscle length;Twitch response elicited              PT Education - 02/25/16 1435    Education provided Yes   Education Details HEP; DN    Person(s) Educated Patient   Methods Explanation;Demonstration;Tactile cues;Verbal cues;Handout   Comprehension Verbalized understanding;Returned demonstration;Verbal cues required;Tactile cues required             PT Long Term Goals - 02/25/16 1405      PT LONG TERM GOAL #1   Title Improve tissue extensibility and ROM bilat LE's with patient to demonstrate increased ROM with assessment 03/17/16   Time 6   Period Weeks   Status On-going     PT LONG TERM GOAL #2   Title Decrease pain with standing and walking with patient to report ability to stand and walk for 20-30 min with 0/10 to 1/10 pain at most 03/17/16   Time 6   Period Weeks   Status On-going     PT LONG TERM GOAL #3   Title Patient reports reductation of pain by 50-75% allowing him to work with greater ease and less pain 03/17/16   Time 6   Period Weeks   Status Partially Met     PT LONG TERM GOAL #4   Title Independent in HEP 03/17/16   Time 6   Period Weeks   Status On-going     PT LONG TERM GOAL #5   Title Improve FOTO to </= 35% limitation 03/17/16   Time 6   Period Weeks   Status On-going               Plan - 02/25/16 1404    Clinical Impression Statement Continued gradual improvement with some flare up of symptoms yesterday and  today likely related to walking on inclines  yesterday. Progressig toward stated goals of therapy.    Rehab Potential Good   PT Frequency 1x / week   PT Duration 6 weeks   PT Treatment/Interventions Patient/family education;ADLs/Self Care Home Management;Cryotherapy;Electrical Stimulation;Iontophoresis '4mg'$ /ml Dexamethasone;Moist Heat;Ultrasound;Dry needling;Manual techniques;Therapeutic activities;Therapeutic exercise   PT Next Visit Plan progress hip flexibility and core stabilization   Consulted and Agree with Plan of Care Patient      Patient will benefit from skilled therapeutic intervention in order to improve the following deficits and impairments:  Improper body mechanics, Pain, Increased fascial restricitons, Increased muscle spasms, Decreased endurance, Decreased activity tolerance  Visit Diagnosis: Other symptoms and signs involving the musculoskeletal system  Chronic bilateral low back pain without sciatica     Problem List Patient Active Problem List   Diagnosis Date Noted  . Idiopathic scoliosis 11/30/2015  . Coccydynia 06/16/2015  . Middle insomnia 02/18/2015  . Nonspecific abnormal electrocardiogram (ECG) (EKG) 07/27/2014  . Snoring 03/13/2014  . Hyperlipidemia with target LDL less than 100 06/20/2013  . Hypothyroidism 06/20/2013  . Personal history of colonic polyps 06/20/2013  . Type II diabetes mellitus with manifestations ([place]) 03/22/2013  . HTN (hypertension) 03/22/2013    Sherman Donaldson Nilda Simmer PT, MPH  02/25/2016, 2:59 PM  [name], [place], [address] Phone: [phone]   Fax:  [phone]  Name: TYROME DONATELLI MRN: [id number] Date of Birth: [birth year]

## 2016-03-01 ENCOUNTER — Encounter: Payer: Self-pay | Admitting: Physical Medicine & Rehabilitation

## 2016-03-01 ENCOUNTER — Encounter
Payer: BLUE CROSS/BLUE SHIELD | Attending: Physical Medicine & Rehabilitation | Admitting: Physical Medicine & Rehabilitation

## 2016-03-01 VITALS — BP 145/79 | HR 79 | Resp 14

## 2016-03-01 DIAGNOSIS — Z87891 Personal history of nicotine dependence: Secondary | ICD-10-CM | POA: Insufficient documentation

## 2016-03-01 DIAGNOSIS — I10 Essential (primary) hypertension: Secondary | ICD-10-CM | POA: Insufficient documentation

## 2016-03-01 DIAGNOSIS — M533 Sacrococcygeal disorders, not elsewhere classified: Secondary | ICD-10-CM | POA: Diagnosis not present

## 2016-03-01 DIAGNOSIS — E785 Hyperlipidemia, unspecified: Secondary | ICD-10-CM | POA: Diagnosis not present

## 2016-03-01 DIAGNOSIS — M4105 Infantile idiopathic scoliosis, thoracolumbar region: Secondary | ICD-10-CM | POA: Diagnosis not present

## 2016-03-01 DIAGNOSIS — E039 Hypothyroidism, unspecified: Secondary | ICD-10-CM | POA: Insufficient documentation

## 2016-03-01 DIAGNOSIS — R42 Dizziness and giddiness: Secondary | ICD-10-CM | POA: Insufficient documentation

## 2016-03-01 DIAGNOSIS — E119 Type 2 diabetes mellitus without complications: Secondary | ICD-10-CM | POA: Insufficient documentation

## 2016-03-01 NOTE — Progress Notes (Signed)
Subjective:    Patient ID: Terry Harrington, male    DOB: [birth year], 61 y.o.   MRN: [id number]  HPI   Terry Harrington is back regarding his back and coccygeal pain. He is using the voltaren bid and the robaxin prn (rarely). His pain has improved substantially. He has had a good experience with PT in [place]. He had acupuncture recently which seemed to help and he has learned a lot of stretches and ways to help manage his pain.   He continues to work full time and is managing well.   Pain Inventory Average Pain 2 Pain Right Now 0 My pain is dull  In the last 24 hours, has pain interfered with the following? General activity 0 Relation with others 0 Enjoyment of life 0 What TIME of day is your pain at its worst? no answer Sleep (in general) Poor  Pain is worse with: walking and standing Pain improves with: medication Relief from Meds: 2  Mobility walk without assistance ability to climb steps?  yes do you drive?  yes  Function employed # of hrs/week 40  Neuro/Psych No problems in this area  Prior Studies Any changes since last visit?  no  Physicians involved in your care Any changes since last visit?  no   Family History  Problem Relation Age of Onset  . Hyperlipidemia Mother   . Hypertension Mother   . Stroke Father   . Kidney disease Father   . Diabetes Father   . Cancer Maternal Grandfather   . Anesthesia problems Neg Hx   . Hypotension Neg Hx   . Malignant hyperthermia Neg Hx   . Pseudochol deficiency Neg Hx    Social History   Social History  . Marital status: Divorced    Spouse name: N/A  . Number of children: N/A  . Years of education: N/A   Occupational History  . Construction    Social History Main Topics  . Smoking status: Former Smoker    Packs/day: 1.50    Years: 26.00    Types: Cigarettes    Quit date: 03/02/2008  . Smokeless tobacco: Former Neurosurgeon    Quit date: 05/25/2008  . Alcohol use 0.0 oz/week     Comment: rarely  . Drug  use: No  . Sexual activity: Not Currently   Other Topics Concern  . None   Social History Narrative  . None   Past Surgical History:  Procedure Laterality Date  . DENTAL SURGERY     several dental procedure approx 10 between 1999-2012  . HERNIA REPAIR     as a child  . SHOULDER ARTHROSCOPY  05/31/2011   Procedure: ARTHROSCOPY SHOULDER;  Surgeon: Kennieth Rad, MD;  Location[facility];  Service: Orthopedics;  Laterality: Left;  Shoulder Acromioplasty Left  . SHOULDER SURGERY  05/31/2011    impingment left   Past Medical History:  Diagnosis Date  . Diabetes mellitus   . GERD (gastroesophageal reflux disease)   . Heart murmur   . Hyperlipidemia   . Hypertension    on meds  . Hypothyroidism    on meds   BP (!) 145/79   Pulse 79   Resp 14   SpO2 96%   Opioid Risk Score:   Fall Risk Score:  `1  Depression screen PHQ 2/9  Depression screen [facility] 2/9 03/01/2016 11/30/2015 01/23/2014 04/18/2013 04/18/2013  Decreased Interest 0 0 0 0 0  Down, Depressed, Hopeless 0 0 0 0 0  PHQ - 2  Score 0 0 0 0 0  Altered sleeping - 3 - - -  Tired, decreased energy - 1 - - -  Change in appetite - 0 - - -  Feeling bad or failure about yourself  - 0 - - -  Trouble concentrating - 0 - - -  Moving slowly or fidgety/restless - 0 - - -  Suicidal thoughts - 0 - - -  PHQ-9 Score - 4 - - -    Review of Systems  Constitutional: Negative.   HENT: Negative.   Eyes: Negative.   Respiratory: Negative.   Cardiovascular: Negative.   Gastrointestinal: Negative.   Endocrine:       High blood sugar  Genitourinary: Negative.   Musculoskeletal: Negative.   Skin: Negative.   Allergic/Immunologic: Negative.   Neurological: Negative.   Hematological: Negative.   Psychiatric/Behavioral: Negative.   All other systems reviewed and are negative.      Objective:   Physical Exam  General: Alert and oriented x 3, No apparent distress. Normal build.  HEENT:Head is normocephalic, atraumatic, PERRLA, EOMI,  sclera anicteric, oral mucosa pink and moist, dentition intact, ext ear canals clear,  Neck:Supple without JVD or lymphadenopathy Heart:RRR Chest:CTA B Abdomen:soft and NT Extremities:No clubbing, cyanosis, or edema. Pulses are 2+ Skin:Clean and intact without signs of breakdown Neuro:Pt is cognitively appropriate with normal insight, memory, and awareness. Cranial nerves 2-12 are intact. Sensory exam is normal except for mild sensory loss in both feet. Reflexes are 2+ in all 4's. Fine motor coordination is intact. No tremors. Motor function is grossly 5/5.  Musculoskeletal:dextroscoliosis apex at T12, L1. Seemed a bit less severe---15 degrees perhaps.  Right paraspinals are less taut and tender along curve. He also has pain along gluteal area right more than left. Coccyx no-tender to touch. He is able to flex to   90+ degrees with minimal pain. .hamstrings and hips are more flexible.   Right hemipelvis remains slightly elevated. His gait is fairly well compensated. Has nornal balance and gait Full ROM, No pain with AROM or PROM in the neck, trunk, or extremities. Posture appropriate Psych:Pt's affect is appropriate. Pt is cooperative. pleasant        Assessment & Plan:  1. Coccydynia: no apparent trauma, but may be a cumulative effect of his chronic sitting/bouncing at work in addition to impaired posture/mechanics---improved today 2. Juvenile Idiopathic Scoliosis.  3. DM2    Plan:  1. Continue with outpt therapies--has made nice progress with ROm and posture   2. For spasm, continue robaxin 500mg  q6 prn---using rarely 3. Continue scheduled diclofenac 75mg  BID with food--instructions were given to start tapering in about a month. 4. Needs to take breaks to stretch while at work and be aware of ergonomical factors.  5. Continue using a foam/gel cushion while sitting at work in his seat 6. 15 minutes of face to face patient care time were spent during this visit. All  questions were encouraged and answered. I'll follow up with him in about 6 months.

## 2016-03-01 NOTE — Patient Instructions (Signed)
IN ABOUT A MONTH DECREASE DICLOFENAC TO ONCE DAILY IN THE MORNING. IF ALL GOES WELL AFTER ANOTHER MONTH YOU CAN CHANGE THE DICLOFENAC TO AS NEEDED ONLY.

## 2016-03-02 ENCOUNTER — Ambulatory Visit (INDEPENDENT_AMBULATORY_CARE_PROVIDER_SITE_OTHER): Payer: BLUE CROSS/BLUE SHIELD | Admitting: Physical Therapy

## 2016-03-02 DIAGNOSIS — R29898 Other symptoms and signs involving the musculoskeletal system: Secondary | ICD-10-CM

## 2016-03-02 DIAGNOSIS — G8929 Other chronic pain: Secondary | ICD-10-CM

## 2016-03-02 DIAGNOSIS — M545 Low back pain, unspecified: Secondary | ICD-10-CM

## 2016-03-02 NOTE — Patient Instructions (Signed)
  3 part core exercise    With neutral spine, tighten pelvic floor, then tighten abdominal muscles sucking your belly button to back bone, then tighten muscles in low back at waist. Hold 10 seconds. Repeat _10_ times. Do _several__ times a day.  * When you have mastered this, you can contract all 3 muscle groups at same time.   * Progress to do this activity sitting, standing, walking and functional activities.   Knee to Chest: Transverse Plane Stability   Bring one knee up, then return. Be sure pelvis does not roll side to side. Keep pelvis still. Lift knee __10_ times each leg. Restabilize pelvis. Repeat with other leg. Do _1-2__ sets, _1__ times per day.   Hip External Rotation With Pillow: Transverse Plane Stability   Keep both knees BENT.  Slowly roll bent knee out. Be sure pelvis does not rotate. Do _10__ times. Restabilize pelvis. Repeat with other leg. Do _1-2__ sets, _1__ times per day.  Pelvic Press     Place hands under belly between navel and pubic bone, palms up. Feel pressure on hands. Increase pressure on hands by pressing pelvis down. This is NOT a pelvic tilt. Hold __5_ seconds. Relax. Repeat _10__ times. Once a day.  KNEE: Flexion - Prone   Hold pelvic press. Bend knee, then return the foot down. Repeat on opposite leg. Do not raise hips. _10__ reps per set. When this is mastered, pull both heels up at same time, x 10 reps.  Once a day  [facility] [address] [place], [place] [address]  [phone] (office) [phone] (fax)

## 2016-03-02 NOTE — Therapy (Signed)
[place], [place], [address] Phone: [phone]   Fax:  [phone]  Physical Therapy Treatment  Patient Details  Name: Terry Harrington MRN: [id number] Date of Birth: 29-Jan-1955 Referring Provider: Dr. Alger Simons   Encounter Date: 03/02/2016      PT End of Session - 03/02/16 1626    Visit Number 6   Number of Visits 12   Date for PT Re-Evaluation 03/17/16   PT Start Time 1622  pt arrived late   PT Stop Time 1700   PT Time Calculation (min) 38 min   Activity Tolerance Patient tolerated treatment well   Behavior During Therapy [facility] for tasks assessed/performed      Past Medical History:  Diagnosis Date  . Diabetes mellitus   . GERD (gastroesophageal reflux disease)   . Heart murmur   . Hyperlipidemia   . Hypertension    on meds  . Hypothyroidism    on meds    Past Surgical History:  Procedure Laterality Date  . DENTAL SURGERY     several dental procedure approx 10 between 1999-2012  . HERNIA REPAIR     as a child  . SHOULDER ARTHROSCOPY  05/31/2011   Procedure: ARTHROSCOPY SHOULDER;  Surgeon: Sharmon Revere, MD;  Location: [place];  Service: Orthopedics;  Laterality: Left;  Shoulder Acromioplasty Left  . SHOULDER SURGERY  05/31/2011    impingment left    There were no vitals filed for this visit.      Subjective Assessment - 03/02/16 1626    Subjective Pt reports he was sore after DN, but he had noticeable relief.  He can now stand for 3 hrs without pain and can bend over without pain. Pt reports he feels he may be ready for d/c in one additional visit.     Currently in Pain? No/denies   Pain Score 0-No pain            [facility] PT Assessment - 03/02/16 0001      Assessment   Medical Diagnosis Low back and Rt hip pain    Referring Provider Dr. Alger Simons    Onset Date/Surgical Date 01/24/16   Hand Dominance Right   Next MD Visit in 6 months     Flexibility   Hamstrings bilat ~ 90 deg (with opp knee bent)   Quadriceps bilat ~110 deg.           [place] Adult PT Treatment/Exercise - 03/02/16 0001      Lumbar Exercises: Stretches   Press Ups --  2-3 sec x 10 to pt tolerance   Quad Stretch 2 reps;30 seconds  prone with strap   Piriformis Stretch Limitations knee to opposite shoudler 30 sec x 2      Lumbar Exercises: Aerobic   Stationary Bike NuStep L5 x 5 min     Lumbar Exercises: Seated   Sit to Stand 5 reps  with core engaged,      Lumbar Exercises: Supine   Ab Set 10 reps;5 seconds   Clam 15 reps  each side alternating, with ab set   Bent Knee Raise 10 reps  arm and opposite le alt x 10 w/ core engaged x 10    Bridge 15 reps     Lumbar Exercises: Prone   Other Prone Lumbar Exercises pelvic press - multiple attempts, difficulty engaging proper muscle without glutes.      Modalities   Modalities --  pt declined. will do  at home.                 PT Education - 03/02/16 1706    Education provided Yes   Education Details HEP - TA exercises.    Person(s) Educated Patient   Methods Handout;Explanation   Comprehension Verbalized understanding             PT Long Term Goals - 03/02/16 1627      PT LONG TERM GOAL #1   Title Improve tissue extensibility and ROM bilat LE's with patient to demonstrate increased ROM with assessment 03/17/16   Time 6   Period Weeks   Status On-going     PT LONG TERM GOAL #2   Title Decrease pain with standing and walking with patient to report ability to stand and walk for 20-30 min with 0/10 to 1/10 pain at most 03/17/16   Time 6   Period Weeks   Status Achieved     PT LONG TERM GOAL #3   Title Patient reports reductation of pain by 50-75% allowing him to work with greater ease and less pain 03/17/16   Time 6   Period Weeks   Status --  75% reduction     PT LONG TERM GOAL #4   Title Independent in HEP 03/17/16   Time 6   Period Weeks   Status On-going     PT LONG TERM GOAL #5    Title Improve FOTO to </= 35% limitation 03/17/16   Time 6   Period Weeks   Status On-going               Plan - 03/02/16 1642    Clinical Impression Statement Pt is reporting significant reduction in pain since starting therapy; has met LTG #2 and 3.  He tolerated all exercises well, with some minor modifications to ease LB discomfort.  He required some initial VC to correctly engage TA for core strengthening.  Progressing well towards remaining goals.    Rehab Potential Good   PT Frequency 2x / week   PT Duration 6 weeks   PT Treatment/Interventions Patient/family education;ADLs/Self Care Home Management;Cryotherapy;Electrical Stimulation;Iontophoresis 40m/ml Dexamethasone;Moist Heat;Ultrasound;Dry needling;Manual techniques;Therapeutic activities;Therapeutic exercise   PT Next Visit Plan progress hip flexibility and core stabilization. Assess readiness for d/c.    Consulted and Agree with Plan of Care Patient      Patient will benefit from skilled therapeutic intervention in order to improve the following deficits and impairments:  Improper body mechanics, Pain, Increased fascial restricitons, Increased muscle spasms, Decreased endurance, Decreased activity tolerance  Visit Diagnosis: Other symptoms and signs involving the musculoskeletal system  Chronic bilateral low back pain without sciatica     Problem List Patient Active Problem List   Diagnosis Date Noted  . Idiopathic scoliosis 11/30/2015  . Coccydynia 06/16/2015  . Middle insomnia 02/18/2015  . Nonspecific abnormal electrocardiogram (ECG) (EKG) 07/27/2014  . Snoring 03/13/2014  . Hyperlipidemia with target LDL less than 100 06/20/2013  . Hypothyroidism 06/20/2013  . Personal history of colonic polyps 06/20/2013  . Type II diabetes mellitus with manifestations ([place] 03/22/2013  . HTN (hypertension) 03/22/2013   JKerin Perna PTA 03/02/16 5:20 PM  [address] [address]Phone: [phone]  Fax:  [phone] Name: Terry HOLTMANMRN: [id number] of Birth: 1October 06, 1957

## 2016-03-03 ENCOUNTER — Ambulatory Visit (INDEPENDENT_AMBULATORY_CARE_PROVIDER_SITE_OTHER): Payer: BLUE CROSS/BLUE SHIELD | Admitting: Rehabilitative and Restorative Service Providers"

## 2016-03-03 ENCOUNTER — Encounter: Payer: Self-pay | Admitting: Rehabilitative and Restorative Service Providers"

## 2016-03-03 DIAGNOSIS — G8929 Other chronic pain: Secondary | ICD-10-CM | POA: Diagnosis not present

## 2016-03-03 DIAGNOSIS — M545 Low back pain: Secondary | ICD-10-CM | POA: Diagnosis not present

## 2016-03-03 DIAGNOSIS — R29898 Other symptoms and signs involving the musculoskeletal system: Secondary | ICD-10-CM | POA: Diagnosis not present

## 2016-03-03 NOTE — Therapy (Addendum)
[place], [place], [address] Phone: [phone]   Fax:  [phone]  Physical Therapy Treatment  Patient Details  Name: Terry Harrington MRN: [id number] Date of Birth: 08/20/1955 Referring Provider: Dr. Alger Simons   Encounter Date: 03/03/2016      PT End of Session - 03/03/16 1634    Visit Number 7   Number of Visits 12   Date for PT Re-Evaluation 03/17/16   PT Start Time 1501   PT Stop Time 0923   PT Time Calculation (min) 56 min   Activity Tolerance Patient tolerated treatment well      Past Medical History:  Diagnosis Date  . Diabetes mellitus   . GERD (gastroesophageal reflux disease)   . Heart murmur   . Hyperlipidemia   . Hypertension    on meds  . Hypothyroidism    on meds    Past Surgical History:  Procedure Laterality Date  . DENTAL SURGERY     several dental procedure approx 10 between 1999-2012  . HERNIA REPAIR     as a child  . SHOULDER ARTHROSCOPY  05/31/2011   Procedure: ARTHROSCOPY SHOULDER;  Surgeon: Sharmon Revere, MD;  Location: [place];  Service: Orthopedics;  Laterality: Left;  Shoulder Acromioplasty Left  . SHOULDER SURGERY  05/31/2011    impingment left    There were no vitals filed for this visit.      Subjective Assessment - 03/03/16 1630    Subjective Good improvement in back and coccyx pain. Feels he is close to ready to continue with exercise on his own. Pleased with his progress.    Currently in Pain? No/denies                         [facility] Adult PT Treatment/Exercise - 03/03/16 0001      Lumbar Exercises: Stretches   Passive Hamstring Stretch 3 reps;30 seconds   Standing Extension 3 reps   Press Ups --  2-3 sec x 10 to pt tolerance   Quad Stretch 2 reps;30 seconds  prone with strap   Piriformis Stretch Limitations knee to opposite shoudler 30 sec x 2      Lumbar Exercises: Aerobic   Stationary Bike NuStep L5 x 5 min     Lumbar Exercises: Supine   Ab Set 10 reps  10 sec hold      Moist Heat Therapy   Number Minutes Moist Heat 20 Minutes   Moist Heat Location Lumbar Spine     Electrical Stimulation   Electrical Stimulation Location bilat lumbar paraspinals    Electrical Stimulation Action IFC   Electrical Stimulation Parameters  to tolerance   Electrical Stimulation Goals Pain;Tone     Manual Therapy   Manual therapy comments pt prone    Joint Mobilization CPA mobs grade II/III lumbar    Soft tissue mobilization bilat hips - through hip abductors/piriformis    Other Manual Therapy stretching bilat hips IR/ER manually and with strap, supine and prone.           Trigger Point Dry Needling - 03/03/16 1633    Consent Given? Yes   Muscles Treated Upper Body --  gluteus medius - DN bilat all mm decreased tightness    Longissimus Response Palpable increased muscle length  bilat lumbar    Gluteus Maximus Response Palpable increased muscle length   Piriformis Response Palpable increased muscle length  PT Education - 03/02/16 1706    Education provided Yes   Education Details HEP - TA exercises.    Person(s) Educated Patient   Methods Handout;Explanation   Comprehension Verbalized understanding             PT Long Term Goals - 03/03/16 1636      PT LONG TERM GOAL #1   Title Improve tissue extensibility and ROM bilat LE's with patient to demonstrate increased ROM with assessment 03/17/16   Time 6   Period Weeks   Status Achieved     PT LONG TERM GOAL #2   Title Decrease pain with standing and walking with patient to report ability to stand and walk for 20-30 min with 0/10 to 1/10 pain at most 03/17/16   Time 6   Period Weeks   Status Achieved     PT LONG TERM GOAL #3   Title Patient reports reductation of pain by 50-75% allowing him to work with greater ease and less pain 03/17/16   Time 6   Period Weeks   Status Achieved     PT LONG TERM GOAL #4   Title  Independent in HEP 03/17/16   Time 6   Period Weeks   Status Achieved     PT LONG TERM GOAL #5   Title Improve FOTO to </= 35% limitation 03/17/16   Time 6   Period Weeks   Status On-going               Plan - 03/03/16 1635    Clinical Impression Statement Excellent progress with improved trunk and LE mobilty; decreased pain and improved function. Good response to DN. Will call to cancel appointment next week if he feels he is ready to continue with independent HEP.    Rehab Potential Good   PT Frequency 2x / week   PT Duration 6 weeks   PT Treatment/Interventions Patient/family education;ADLs/Self Care Home Management;Cryotherapy;Electrical Stimulation;Iontophoresis 68m/ml Dexamethasone;Moist Heat;Ultrasound;Dry needling;Manual techniques;Therapeutic activities;Therapeutic exercise   PT Next Visit Plan progress hip flexibility and core stabilization. Possible d/c.    Consulted and Agree with Plan of Care Patient      Patient will benefit from skilled therapeutic intervention in order to improve the following deficits and impairments:  Improper body mechanics, Pain, Increased fascial restricitons, Increased muscle spasms, Decreased endurance, Decreased activity tolerance  Visit Diagnosis: Other symptoms and signs involving the musculoskeletal system  Chronic bilateral low back pain without sciatica     Problem List Patient Active Problem List   Diagnosis Date Noted  . Idiopathic scoliosis 11/30/2015  . Coccydynia 06/16/2015  . Middle insomnia 02/18/2015  . Nonspecific abnormal electrocardiogram (ECG) (EKG) 07/27/2014  . Snoring 03/13/2014  . Hyperlipidemia with target LDL less than 100 06/20/2013  . Hypothyroidism 06/20/2013  . Personal history of colonic polyps 06/20/2013  . Type II diabetes mellitus with manifestations ([place] 03/22/2013  . HTN (hypertension) 03/22/2013    Leisel Pinette PNilda SimmerPT, MPH  03/03/2016, 4:38 PM  [facility] [address]Phone: [phone]  Fax:  [phone] Name: MNASEER HEARNMRN: [id number] of Birth: 1November 05, 1957 PHYSICAL THERAPY DISCHARGE SUMMARY  Visits from Start of Care: 7  Current functional level related to goals / functional outcomes: See last progress note for discharge status   Remaining deficits: Will continue with home exercise and corrected body mechanics   Education / Equipment: HEP Plan: Patient agrees to discharge.  Patient goals were met. Patient is being discharged due to meeting the stated rehab goals.  ?????    Sheenah Dimitroff P. Helene Kelp PT, MPH 03/28/16 8:55 AM

## 2016-03-08 ENCOUNTER — Ambulatory Visit: Payer: BLUE CROSS/BLUE SHIELD | Admitting: Physical Medicine & Rehabilitation

## 2016-03-09 ENCOUNTER — Encounter: Payer: BLUE CROSS/BLUE SHIELD | Admitting: Physical Therapy

## 2016-03-10 ENCOUNTER — Encounter: Payer: BLUE CROSS/BLUE SHIELD | Admitting: Physical Therapy

## 2016-04-07 ENCOUNTER — Ambulatory Visit: Payer: BLUE CROSS/BLUE SHIELD | Admitting: Endocrinology

## 2016-04-17 ENCOUNTER — Ambulatory Visit (INDEPENDENT_AMBULATORY_CARE_PROVIDER_SITE_OTHER): Payer: BLUE CROSS/BLUE SHIELD | Admitting: Endocrinology

## 2016-04-17 ENCOUNTER — Encounter: Payer: Self-pay | Admitting: Endocrinology

## 2016-04-17 VITALS — BP 132/78 | HR 96 | Ht 71.0 in | Wt 218.0 lb

## 2016-04-17 DIAGNOSIS — E118 Type 2 diabetes mellitus with unspecified complications: Secondary | ICD-10-CM | POA: Diagnosis not present

## 2016-04-17 LAB — MICROALBUMIN / CREATININE URINE RATIO
Creatinine,U: 478.9 mg/dL
Microalb Creat Ratio: 0.6 mg/g (ref 0.0–30.0)
Microalb, Ur: 2.7 mg/dL — ABNORMAL HIGH (ref 0.0–1.9)

## 2016-04-17 MED ORDER — DAPAGLIFLOZIN PROPANEDIOL 5 MG PO TABS: 5.0000 mg | ORAL_TABLET | Freq: Every day | ORAL | 11 refills | Status: DC

## 2016-04-17 NOTE — Progress Notes (Signed)
Subjective:    Patient ID: Terry Harrington, male    DOB: 25-Oct-1955, 61 y.o.   MRN: [id number]  HPI Pt returns for f/u of diabetes mellitus: DM type: 2 Dx'ed: 0160 Complications: none.   Therapy:  3 oral meds.  DKA: never.  Severe hypoglycemia: never.  Pancreatitis: never Other: he took insulin 2015-2017; in early 2017, he was hospitalized for nonketotic hyperosmolar hyperglycemic state, due to missing insulin doses, he works M-F, day shift.  He says cbg's are higher on weekends.   Interval history: no cbg record, but states cbg's are well-controlled.  pt states he feels well in general.  Past Medical History:  Diagnosis Date  . Diabetes mellitus   . GERD (gastroesophageal reflux disease)   . Heart murmur   . Hyperlipidemia   . Hypertension    on meds  . Hypothyroidism    on meds    Past Surgical History:  Procedure Laterality Date  . DENTAL SURGERY     several dental procedure approx 10 between 1999-2012  . HERNIA REPAIR     as a child  . SHOULDER ARTHROSCOPY  05/31/2011   Procedure: ARTHROSCOPY SHOULDER;  Surgeon: Sharmon Revere, MD;  Location: [facility];  Service: Orthopedics;  Laterality: Left;  Shoulder Acromioplasty Left  . SHOULDER SURGERY  05/31/2011    impingment left    Social History   Social History  . Marital status: Divorced    Spouse name: N/A  . Number of children: N/A  . Years of education: N/A   Occupational History  . Construction    Social History Main Topics  . Smoking status: Former Smoker    Packs/day: 1.50    Years: 26.00    Types: Cigarettes    Quit date: 03/02/2008  . Smokeless tobacco: Former Systems developer    Quit date: 05/25/2008  . Alcohol use 0.0 oz/week     Comment: rarely  . Drug use: No  . Sexual activity: Not Currently   Other Topics Concern  . Not on file   Social History Narrative  . No narrative on file    Current Outpatient Prescriptions on File Prior to Visit  Medication Sig Dispense Refill  . cholecalciferol (VITAMIN  D) 1000 UNITS tablet Take 2,000 Units by mouth daily.    . diclofenac (VOLTAREN) 75 MG EC tablet Take 1 tablet (75 mg total) by mouth 2 (two) times daily with a meal. 60 tablet 3  . levothyroxine (SYNTHROID, LEVOTHROID) 100 MCG tablet Take 1 tablet (100 mcg total) by mouth daily. 90 tablet 3  . meclizine (ANTIVERT) 25 MG tablet Take 1 tablet (25 mg total) by mouth 3 (three) times daily as needed for dizziness. 30 tablet 0  . methocarbamol (ROBAXIN) 500 MG tablet Take 1 tablet (500 mg total) by mouth every 6 (six) hours as needed for muscle spasms. 60 tablet 2  . Multiple Vitamin (MULITIVITAMIN WITH MINERALS) TABS Take 1 tablet by mouth daily.    . pioglitazone-metformin (ACTOPLUS MET) 15-850 MG tablet TAKE 2 TABLETS EVERY DAY WITH SUPPER 180 tablet 1  . pravastatin (PRAVACHOL) 40 MG tablet Take 1 tablet (40 mg total) by mouth daily. 90 tablet 3  . sitaGLIPtin (JANUVIA) 100 MG tablet Take 1 tablet (100 mg total) by mouth daily. 90 tablet 1  . TEKTURNA 150 MG tablet TAKE 1 TABLET (150 MG TOTAL) BY MOUTH DAILY. 90 tablet 1  . vitamin B-12 (CYANOCOBALAMIN) 1000 MCG tablet Take 2,000 mcg by mouth daily.  No current facility-administered medications on file prior to visit.     Allergies  Allergen Reactions  . Crab [Shellfish Allergy] Anaphylaxis  . Iodine Anaphylaxis  . Lisinopril     cough  . Crestor [Rosuvastatin]     Muscle aches  . Iohexol Swelling  . Peanut-Containing Drug Products Itching    And whelps  . Aspirin Rash  . Codeine Rash  . Penicillins Rash    Has patient had a PCN reaction causing immediate rash, facial/tongue/throat swelling, SOB or lightheadedness with hypotension: Yes Has patient had a PCN reaction causing severe rash involving mucus membranes or skin necrosis: Yes Has patient had a PCN reaction that required hospitalization No Has patient had a PCN reaction occurring within the last 10 years: No If all of the above answers are "NO", then may proceed with  Cephalosporin use.     Family History  Problem Relation Age of Onset  . Hyperlipidemia Mother   . Hypertension Mother   . Stroke Father   . Kidney disease Father   . Diabetes Father   . Cancer Maternal Grandfather   . Anesthesia problems Neg Hx   . Hypotension Neg Hx   . Malignant hyperthermia Neg Hx   . Pseudochol deficiency Neg Hx     BP 132/78   Pulse 96   Ht _0  (1.803 m)   Wt 218 lb (98.9 kg)   SpO2 97%   BMI 30.40 kg/m    Review of Systems He denies hypoglycemia    Objective:   Physical Exam VITAL SIGNS:  See vs page GENERAL: no distress Pulses: dorsalis pedis intact bilat.   MSK: no deformity of the feet CV: trace bilat leg edema Skin:  no ulcer on the feet.  normal color and temp on the feet. Neuro: sensation is intact to touch on the feet.   Ext: There is bilateral onychomycosis of the toenails.     Lab Results  Component Value Date   HGBA1C 7.4 (H) 02/23/2016   Lab Results  Component Value Date   CREATININE 0.98 02/23/2016   BUN 11 02/23/2016   NA 139 02/23/2016   K 3.9 02/23/2016   CL 107 02/23/2016   CO2 27 02/23/2016      Assessment & Plan:  Type 2 DM: he needs increased rx  Patient Instructions  check your blood sugar twice a day.  vary the time of day when you check, between before the 3 meals, and at bedtime.  also check if you have symptoms of your blood sugar being too high or too low.  please keep a record of the readings and bring it to your next appointment here (or you can bring the meter itself).  You can write it on any piece of paper.  please call us sooner if your blood sugar goes below 70, or if you have a lot of readings over 200.   I have sent a prescription to your pharmacy, to add "farxiga."  Please come back for a follow-up appointment in 4-5 months.

## 2016-04-17 NOTE — Patient Instructions (Addendum)
check your blood sugar twice a day.  vary the time of day when you check, between before the 3 meals, and at bedtime.  also check if you have symptoms of your blood sugar being too high or too low.  please keep a record of the readings and bring it to your next appointment here (or you can bring the meter itself).  You can write it on any piece of paper.  please call us sooner if your blood sugar goes below 70, or if you have a lot of readings over 200.   I have sent a prescription to your pharmacy, to add "farxiga."  Please come back for a follow-up appointment in 4-5 months.

## 2016-04-25 ENCOUNTER — Other Ambulatory Visit: Payer: Self-pay | Admitting: Internal Medicine

## 2016-05-18 ENCOUNTER — Other Ambulatory Visit: Payer: Self-pay | Admitting: Internal Medicine

## 2016-05-29 ENCOUNTER — Telehealth: Payer: Self-pay | Admitting: Endocrinology

## 2016-05-29 NOTE — Telephone Encounter (Signed)
Pt is having an allergic reaction joint pain in right elbow with weakness   Thinks this was due to the farxiga, he stoppped taking it on Saturday and the pain is subsiding

## 2016-05-30 NOTE — Telephone Encounter (Signed)
Patient notified of message and advised of message via voicemail. Requested a call back if the patient to make 2 month follow up appointment.

## 2016-05-30 NOTE — Telephone Encounter (Signed)
D/c farxiga Please come back for a follow-up appointment in 2 months.

## 2016-07-24 ENCOUNTER — Other Ambulatory Visit: Payer: Self-pay | Admitting: Internal Medicine

## 2016-08-18 ENCOUNTER — Other Ambulatory Visit: Payer: Self-pay | Admitting: Internal Medicine

## 2016-08-21 ENCOUNTER — Other Ambulatory Visit (INDEPENDENT_AMBULATORY_CARE_PROVIDER_SITE_OTHER): Payer: BLUE CROSS/BLUE SHIELD

## 2016-08-21 ENCOUNTER — Encounter: Payer: Self-pay | Admitting: Physical Medicine & Rehabilitation

## 2016-08-21 ENCOUNTER — Encounter
Payer: BLUE CROSS/BLUE SHIELD | Attending: Physical Medicine & Rehabilitation | Admitting: Physical Medicine & Rehabilitation

## 2016-08-21 ENCOUNTER — Encounter: Payer: Self-pay | Admitting: Endocrinology

## 2016-08-21 ENCOUNTER — Encounter: Payer: Self-pay | Admitting: Internal Medicine

## 2016-08-21 ENCOUNTER — Ambulatory Visit (INDEPENDENT_AMBULATORY_CARE_PROVIDER_SITE_OTHER): Payer: BLUE CROSS/BLUE SHIELD | Admitting: Internal Medicine

## 2016-08-21 ENCOUNTER — Ambulatory Visit (INDEPENDENT_AMBULATORY_CARE_PROVIDER_SITE_OTHER): Payer: BLUE CROSS/BLUE SHIELD | Admitting: Endocrinology

## 2016-08-21 VITALS — BP 148/60 | HR 80 | Temp 97.9°F | Resp 16 | Ht 71.0 in | Wt 217.0 lb

## 2016-08-21 VITALS — BP 139/86 | HR 77

## 2016-08-21 VITALS — BP 132/82 | HR 68 | Ht 71.0 in | Wt 215.0 lb

## 2016-08-21 DIAGNOSIS — E119 Type 2 diabetes mellitus without complications: Secondary | ICD-10-CM | POA: Diagnosis not present

## 2016-08-21 DIAGNOSIS — E785 Hyperlipidemia, unspecified: Secondary | ICD-10-CM | POA: Diagnosis not present

## 2016-08-21 DIAGNOSIS — I10 Essential (primary) hypertension: Secondary | ICD-10-CM | POA: Diagnosis not present

## 2016-08-21 DIAGNOSIS — E039 Hypothyroidism, unspecified: Secondary | ICD-10-CM

## 2016-08-21 DIAGNOSIS — Z87891 Personal history of nicotine dependence: Secondary | ICD-10-CM | POA: Diagnosis not present

## 2016-08-21 DIAGNOSIS — E118 Type 2 diabetes mellitus with unspecified complications: Secondary | ICD-10-CM

## 2016-08-21 DIAGNOSIS — M533 Sacrococcygeal disorders, not elsewhere classified: Secondary | ICD-10-CM | POA: Insufficient documentation

## 2016-08-21 DIAGNOSIS — K219 Gastro-esophageal reflux disease without esophagitis: Secondary | ICD-10-CM | POA: Diagnosis not present

## 2016-08-21 DIAGNOSIS — M41119 Juvenile idiopathic scoliosis, site unspecified: Secondary | ICD-10-CM | POA: Insufficient documentation

## 2016-08-21 LAB — POCT GLYCOSYLATED HEMOGLOBIN (HGB A1C): Hemoglobin A1C: 7.3

## 2016-08-21 MED ORDER — CANAGLIFLOZIN 100 MG PO TABS: 100.0000 mg | ORAL_TABLET | Freq: Every day | ORAL | 3 refills | Status: DC

## 2016-08-21 NOTE — Progress Notes (Signed)
Subjective:    Patient ID: Terry Harrington, male    DOB: 01/17/1955, 61 y.o.   MRN: [id number]  HPI   Terry Harrington is here in follow up of his coccydynia. His pain has been well controlled. He is working full time (+). He was started on farxiga which caused severe elbow pain. He was taken off and pain has subsided.   He is off all pain medications including robaxin. He really isn't even using tylenol now. He plans to work until he's 63. He states that his company is also stressing ergonomic factors which has helped him.    Pain Inventory Average Pain 1 Pain Right Now 0 My pain is sharp  In the last 24 hours, has pain interfered with the following? General activity 0 Relation with others 0 Enjoyment of life 0 What TIME of day is your pain at its worst? daytime Sleep (in general) Fair  Pain is worse with: . Pain improves with: . Relief from Meds: .  Mobility walk without assistance  Function employed # of hrs/week .  Neuro/Psych No problems in this area  Prior Studies Any changes since last visit?  no  Physicians involved in your care Any changes since last visit?  no   Family History  Problem Relation Age of Onset  . Hyperlipidemia Mother   . Hypertension Mother   . Stroke Father   . Kidney disease Father   . Diabetes Father   . Cancer Maternal Grandfather   . Anesthesia problems Neg Hx   . Hypotension Neg Hx   . Malignant hyperthermia Neg Hx   . Pseudochol deficiency Neg Hx    Social History   Social History  . Marital status: Divorced    Spouse name: N/A  . Number of children: N/A  . Years of education: N/A   Occupational History  . Construction    Social History Main Topics  . Smoking status: Former Smoker    Packs/day: 1.50    Years: 26.00    Types: Cigarettes    Quit date: 03/02/2008  . Smokeless tobacco: Former Neurosurgeon    Quit date: 05/25/2008  . Alcohol use 0.0 oz/week     Comment: rarely  . Drug use: No  . Sexual activity: Not  Currently   Other Topics Concern  . Not on file   Social History Narrative  . No narrative on file   Past Surgical History:  Procedure Laterality Date  . DENTAL SURGERY     several dental procedure approx 10 between 1999-2012  . HERNIA REPAIR     as a child  . SHOULDER ARTHROSCOPY  05/31/2011   Procedure: ARTHROSCOPY SHOULDER;  Surgeon: Kennieth Rad, MD;  Location: [facility];  Service: Orthopedics;  Laterality: Left;  Shoulder Acromioplasty Left  . SHOULDER SURGERY  05/31/2011    impingment left   Past Medical History:  Diagnosis Date  . Diabetes mellitus   . GERD (gastroesophageal reflux disease)   . Heart murmur   . Hyperlipidemia   . Hypertension    on meds  . Hypothyroidism    on meds   There were no vitals taken for this visit.  Opioid Risk Score:   Fall Risk Score:  `1  Depression screen PHQ 2/9  Depression screen [facility] 2/9 03/01/2016 11/30/2015 01/23/2014 04/18/2013 04/18/2013  Decreased Interest 0 0 0 0 0  Down, Depressed, Hopeless 0 0 0 0 0  PHQ - 2 Score 0 0 0 0 0  Altered sleeping -  3 - - -  Tired, decreased energy - 1 - - -  Change in appetite - 0 - - -  Feeling bad or failure about yourself  - 0 - - -  Trouble concentrating - 0 - - -  Moving slowly or fidgety/restless - 0 - - -  Suicidal thoughts - 0 - - -  PHQ-9 Score - 4 - - -     Review of Systems  Constitutional: Negative.   HENT: Negative.   Eyes: Negative.   Respiratory: Negative.   Cardiovascular: Negative.   Gastrointestinal: Negative.   Endocrine: Negative.   Genitourinary: Negative.   Musculoskeletal: Negative.   Skin: Negative.   Allergic/Immunologic: Negative.   Neurological: Negative.   Hematological: Negative.   Psychiatric/Behavioral: Negative.   All other systems reviewed and are negative.      Objective:   Physical Exam  General: Alert and oriented x 3, No apparent distress. Normal build.  HEENT:Head is normocephalic, atraumatic, PERRLA, EOMI, sclera anicteric, oral  mucosa pink and moist, dentition intact, ext ear canals clear,  Neck:Supple without JVD or lymphadenopathy Heart:RRR Chest:CTA B Abdomen:soft and NT Extremities:No clubbing, cyanosis, or edema. Pulses are 2+ Skin:Clean and intact without signs of breakdown Neuro:normal motor/sensory function.   Musculoskeletal:dextroscoliosis apex at T12, L1.  -15 degrees   Right hemipelvis remainsslightly elevated. His gait is fairly well compensated. Has nornal balance and gait. Improved lumbar flexion and extension Full ROM, No pain with AROM or PROM in the neck, trunk, or extremities. Posture appropriate Psych:Pt's affect is appropriate. Pt is cooperative. pleasant        Assessment & Plan:  1. Coccydynia: no apparent trauma, but may be a cumulative effect of his chronic sitting/bouncing at work in addition to impaired posture/mechanics---improved today 2. Juvenile Idiopathic Scoliosis.  3. DM2    Plan:  1. Continue with HEP.   2. Off robaxin  3. May use OTC tylenol or diclofenac 75mg  bid prn  4. Continue to take breaks to stretch while at work and be aware of ergonomical factors.  5. Continueusing a foam/gel cushion while sitting at work in his seat 6. 10minutes of face to face patient care time were spent during this visit. All questions were encouraged and answered. I'll follow up with him in PRN.

## 2016-08-21 NOTE — Progress Notes (Signed)
Subjective:  Patient ID: Terry Harrington, male    DOB: March 23, 1955  Age: 61 y.o. MRN: [id number]  CC: Hypertension; Hypothyroidism; Hyperlipidemia; and Diabetes   HPI INOCENCIO ROY presents for f/up- he feels well and offers no complaints.  Outpatient Medications Prior to Visit  Medication Sig Dispense Refill  . canagliflozin (INVOKANA) 100 MG TABS tablet Take 1 tablet (100 mg total) by mouth daily before breakfast. 90 tablet 3  . cholecalciferol (VITAMIN D) 1000 UNITS tablet Take 2,000 Units by mouth daily.    Marland Kitchen levothyroxine (SYNTHROID, LEVOTHROID) 100 MCG tablet TAKE 1 TABLET BY MOUTH EVERY DAY 90 tablet 1  . pioglitazone-metformin (ACTOPLUS MET) 15-850 MG tablet TAKE 2 TABLETS EVERY DAY WITH SUPPER 60 tablet 0  . pravastatin (PRAVACHOL) 40 MG tablet Take 1 tablet (40 mg total) by mouth daily. 90 tablet 1  . sitaGLIPtin (JANUVIA) 100 MG tablet Take 1 tablet (100 mg total) by mouth daily. 90 tablet 1  . TEKTURNA 150 MG tablet TAKE 1 TABLET (150 MG TOTAL) BY MOUTH DAILY. 90 tablet 1  . vitamin B-12 (CYANOCOBALAMIN) 1000 MCG tablet Take 2,000 mcg by mouth daily.    . meclizine (ANTIVERT) 25 MG tablet Take 1 tablet (25 mg total) by mouth 3 (three) times daily as needed for dizziness. 30 tablet 0  . Multiple Vitamin (MULITIVITAMIN WITH MINERALS) TABS Take 1 tablet by mouth daily.     No facility-administered medications prior to visit.     ROS Review of Systems  Constitutional: Negative.  Negative for diaphoresis, fatigue and unexpected weight change.  HENT: Negative.   Eyes: Negative for visual disturbance.  Respiratory: Negative for cough, chest tightness, shortness of breath and wheezing.   Cardiovascular: Negative for chest pain, palpitations and leg swelling.  Gastrointestinal: Negative for abdominal pain, constipation, diarrhea, nausea and vomiting.  Endocrine: Negative for polydipsia, polyphagia and polyuria.  Genitourinary: Negative.  Negative for difficulty urinating,  dysuria, frequency and urgency.  Musculoskeletal: Negative.  Negative for back pain.  Skin: Negative.   Allergic/Immunologic: Negative.   Neurological: Negative.  Negative for dizziness, weakness, light-headedness and numbness.  Hematological: Negative for adenopathy. Does not bruise/bleed easily.  Psychiatric/Behavioral: Negative.     Objective:  BP (!) 148/60 (BP Location: Left Arm, Patient Position: Sitting, Cuff Size: Normal)   Pulse 80   Temp 97.9 F (36.6 C) (Oral)   Resp 16   Ht _0  (1.803 m)   Wt 217 lb (98.4 kg)   SpO2 99%   BMI 30.27 kg/m   BP Readings from Last 3 Encounters:  08/21/16 (!) 148/60  08/21/16 139/86  08/21/16 132/82    Wt Readings from Last 3 Encounters:  08/21/16 217 lb (98.4 kg)  08/21/16 215 lb (97.5 kg)  04/17/16 218 lb (98.9 kg)    Physical Exam  Constitutional: He is oriented to person, place, and time. No distress.  HENT:  Mouth/Throat: Oropharynx is clear and moist. No oropharyngeal exudate.  Eyes: Conjunctivae are normal. Right eye exhibits no discharge. Left eye exhibits no discharge. No scleral icterus.  Neck: Normal range of motion. Neck supple. No JVD present. No thyromegaly present.  Cardiovascular: Normal rate, regular rhythm, S1 normal, S2 normal and normal heart sounds.   Occasional extrasystoles are present. Exam reveals no gallop and no friction rub.   No murmur heard. EKG -  Sinus  Rhythm  - occasional ectopic ventricular beat    WITHIN NORMAL LIMITS- the PVC's are new  Pulmonary/Chest: Effort normal and breath sounds  normal. No respiratory distress. He has no wheezes. He has no rales. He exhibits no tenderness.  Abdominal: Soft. Bowel sounds are normal. He exhibits no distension and no mass. There is no tenderness. There is no rebound and no guarding.  Musculoskeletal: Normal range of motion. He exhibits no edema, tenderness or deformity.  Lymphadenopathy:    He has no cervical adenopathy.  Neurological: He is alert and  oriented to person, place, and time.  Skin: Skin is warm and dry. No rash noted. He is not diaphoretic. No erythema. No pallor.  Vitals reviewed.   Lab Results  Component Value Date   WBC 6.2 02/23/2016   HGB 13.4 02/23/2016   HCT 39.1 02/23/2016   PLT 311.0 02/23/2016   GLUCOSE 229 (H) 08/21/2016   CHOL 157 08/21/2016   TRIG 121.0 08/21/2016   HDL 56.70 08/21/2016   LDLDIRECT 132.8 02/21/2013   LDLCALC 77 08/21/2016   ALT 11 06/16/2015   AST 12 06/16/2015   NA 139 08/21/2016   K 4.3 08/21/2016   CL 103 08/21/2016   CREATININE 0.94 08/21/2016   BUN 10 08/21/2016   CO2 29 08/21/2016   TSH 1.19 08/21/2016   PSA 0.09 (L) 02/21/2013   HGBA1C 7.3 08/21/2016   MICROALBUR 2.7 (H) 04/17/2016    Dg Lumbar Spine Complete W/bend  Result Date: 11/30/2015 CLINICAL DATA:  Back pain.  Scoliosis. EXAM: SACRUM AND COCCYX - 2+ VIEW; LUMBAR SPINE - COMPLETE WITH BENDING VIEWS COMPARISON:  CT 04/17/2003. FINDINGS: Lower thoracic upper lumbar scoliosis concave left 29 degrees. Mid to lower lumbar scoliosis concave right 20 degrees. SI joints intact. Sacrum is intact. No focal bony abnormality identified. IMPRESSION: 1. Thoracolumbar scoliosis as above. No acute or focal bony abnormality identified. 2. Aortoiliac atherosclerotic vascular disease. Electronically Signed   By: Marcello Moores  Register   On: 11/30/2015 10:59   Dg Sacrum/coccyx  Result Date: 11/30/2015 CLINICAL DATA:  Back pain.  Scoliosis. EXAM: SACRUM AND COCCYX - 2+ VIEW; LUMBAR SPINE - COMPLETE WITH BENDING VIEWS COMPARISON:  CT 04/17/2003. FINDINGS: Lower thoracic upper lumbar scoliosis concave left 29 degrees. Mid to lower lumbar scoliosis concave right 20 degrees. SI joints intact. Sacrum is intact. No focal bony abnormality identified. IMPRESSION: 1. Thoracolumbar scoliosis as above. No acute or focal bony abnormality identified. 2. Aortoiliac atherosclerotic vascular disease. Electronically Signed   By: Marcello Moores  Register   On: 11/30/2015  10:59    Assessment & Plan:   Jusiah was seen today for hypertension, hypothyroidism, hyperlipidemia and diabetes.  Diagnoses and all orders for this visit:  Essential hypertension- his EKG shows a few extrasystoles but this is asymptomatic so we'll not pursue this any further. There is no LVH on EKG. His blood pressure is adequately well-controlled, electrolytes and renal function are normal. -     Basic metabolic panel; Future -     EKG 12-Lead  Acquired hypothyroidism- his TSH is in the normal range so he will remain on the current dose of T4. -     TSH; Future  Hyperlipidemia with target LDL less than 100- he has achieved his LDL goal is doing well on the statin. -     Lipid panel; Future  Type 2 diabetes mellitus with complication, without long-term current use of insulin ([place])- her blood sugars are adequately well-controlled with an A1c of 7.3%.   I have discontinued Mr. Mi multivitamin with minerals and meclizine. I am also having him maintain his vitamin B-12, cholecalciferol, TEKTURNA, sitaGLIPtin, levothyroxine, pioglitazone-metformin, pravastatin, and  canagliflozin.  No orders of the defined types were placed in this encounter.    Follow-up: Return in about 4 months (around 12/21/2016).  Scarlette Calico, MD

## 2016-08-21 NOTE — Progress Notes (Signed)
Subjective:    Patient ID: Terry Harrington, male    DOB: [birth year], 61 y.o.   MRN: [id number]  HPI Pt returns for f/u of diabetes mellitus: DM type: 2 Dx'ed: 4970 Complications: none.   Therapy: 3 oral meds.  DKA: never.  Severe hypoglycemia: never.  Pancreatitis: never Other: he took insulin 2015-2017; in early 2017, he was hospitalized for nonketotic hyperosmolar hyperglycemic state, due to missing insulin doses, he works M-F, day shift.  He says cbg's are higher on weekends.  Interval history: no cbg record, but states cbg's are well-controlled.  He stopped farxiga, due to arthralgias.  pt states he feels well in general.   Past Medical History:  Diagnosis Date  . Diabetes mellitus   . GERD (gastroesophageal reflux disease)   . Heart murmur   . Hyperlipidemia   . Hypertension    on meds  . Hypothyroidism    on meds    Past Surgical History:  Procedure Laterality Date  . DENTAL SURGERY     several dental procedure approx 10 between 1999-2012  . HERNIA REPAIR     as a child  . SHOULDER ARTHROSCOPY  05/31/2011   Procedure: ARTHROSCOPY SHOULDER;  Surgeon: Sharmon Revere, MD;  Location: [place];  Service: Orthopedics;  Laterality: Left;  Shoulder Acromioplasty Left  . SHOULDER SURGERY  05/31/2011    impingment left    Social History   Social History  . Marital status: Divorced    Spouse name: N/A  . Number of children: N/A  . Years of education: N/A   Occupational History  . Construction    Social History Main Topics  . Smoking status: Former Smoker    Packs/day: 1.50    Years: 26.00    Types: Cigarettes    Quit date: 03/02/2008  . Smokeless tobacco: Former Systems developer    Quit date: 05/25/2008  . Alcohol use 0.0 oz/week     Comment: rarely  . Drug use: No  . Sexual activity: Not Currently   Other Topics Concern  . Not on file   Social History Narrative  . No narrative on file    Current Outpatient Prescriptions on File Prior to Visit  Medication Sig  Dispense Refill  . cholecalciferol (VITAMIN D) 1000 UNITS tablet Take 2,000 Units by mouth daily.    Marland Kitchen levothyroxine (SYNTHROID, LEVOTHROID) 100 MCG tablet TAKE 1 TABLET BY MOUTH EVERY DAY 90 tablet 1  . pioglitazone-metformin (ACTOPLUS MET) 15-850 MG tablet TAKE 2 TABLETS EVERY DAY WITH SUPPER 60 tablet 0  . pravastatin (PRAVACHOL) 40 MG tablet Take 1 tablet (40 mg total) by mouth daily. 90 tablet 1  . sitaGLIPtin (JANUVIA) 100 MG tablet Take 1 tablet (100 mg total) by mouth daily. 90 tablet 1  . TEKTURNA 150 MG tablet TAKE 1 TABLET (150 MG TOTAL) BY MOUTH DAILY. 90 tablet 1  . vitamin B-12 (CYANOCOBALAMIN) 1000 MCG tablet Take 2,000 mcg by mouth daily.     No current facility-administered medications on file prior to visit.     Allergies  Allergen Reactions  . Crab [Shellfish Allergy] Anaphylaxis  . Iodine Anaphylaxis  . Lisinopril     cough  . Crestor [Rosuvastatin]     Muscle aches  . Wilder Glade [Dapagliflozin] Other (See Comments)    Joint pain and weakness  . Iohexol Swelling  . Peanut-Containing Drug Products Itching    And whelps  . Aspirin Rash  . Codeine Rash  . Penicillins Rash    Has patient  had a PCN reaction causing immediate rash, facial/tongue/throat swelling, SOB or lightheadedness with hypotension: Yes Has patient had a PCN reaction causing severe rash involving mucus membranes or skin necrosis: Yes Has patient had a PCN reaction that required hospitalization No Has patient had a PCN reaction occurring within the last 10 years: No If all of the above answers are "NO", then may proceed with Cephalosporin use.     Family History  Problem Relation Age of Onset  . Hyperlipidemia Mother   . Hypertension Mother   . Stroke Father   . Kidney disease Father   . Diabetes Father   . Cancer Maternal Grandfather   . Anesthesia problems Neg Hx   . Hypotension Neg Hx   . Malignant hyperthermia Neg Hx   . Pseudochol deficiency Neg Hx     BP 132/82   Pulse 68   Ht 5'  11" (1.803 m)   Wt 215 lb (97.5 kg)   SpO2 98%   BMI 29.99 kg/m    Review of Systems Denies weight change.      Objective:   Physical Exam VITAL SIGNS:  See vs page GENERAL: no distress Pulses: foot pulses are intact bilaterally.   MSK: no deformity of the feet or ankles.  CV: no edema of the legs or ankles Skin:  no ulcer on the feet or ankles.  normal color and temp on the feet and ankles Neuro: sensation is intact to touch on the feet and ankles.   Ext: There is bilateral onychomycosis of the toenails.    Lab Results  Component Value Date   HGBA1C 7.3 08/21/2016   Lab Results  Component Value Date   CREATININE 0.98 02/23/2016   BUN 11 02/23/2016   NA 139 02/23/2016   K 3.9 02/23/2016   CL 107 02/23/2016   CO2 27 02/23/2016      Assessment & Plan:  Arthralgias, new.  Pt says due to [place].  Type 2 DM: he needs increased rx.   Patient Instructions  check your blood sugar twice a day.  vary the time of day when you check, between before the 3 meals, and at bedtime.  also check if you have symptoms of your blood sugar being too high or too low.  please keep a record of the readings and bring it to your next appointment here (or you can bring the meter itself).  You can write it on any piece of paper.  please call us sooner if your blood sugar goes below 70, or if you have a lot of readings over 200.   I have sent a prescription to your pharmacy, to add "invokana."  Please come back for a follow-up appointment in 3-4 months.

## 2016-08-21 NOTE — Patient Instructions (Signed)
check your blood sugar twice a day.  vary the time of day when you check, between before the 3 meals, and at bedtime.  also check if you have symptoms of your blood sugar being too high or too low.  please keep a record of the readings and bring it to your next appointment here (or you can bring the meter itself).  You can write it on any piece of paper.  please call us sooner if your blood sugar goes below 70, or if you have a lot of readings over 200.   I have sent a prescription to your pharmacy, to add "invokana."  Please come back for a follow-up appointment in 3-4 months.

## 2016-08-21 NOTE — Patient Instructions (Signed)
Hypothyroidism Hypothyroidism is a disorder of the thyroid. The thyroid is a large gland that is located in the lower front of the neck. The thyroid releases hormones that control how the body works. With hypothyroidism, the thyroid does not make enough of these hormones. What are the causes? Causes of hypothyroidism may include:  Viral infections.  Pregnancy.  Your own defense system (immune system) attacking your thyroid.  Certain medicines.  Birth defects.  Past radiation treatments to your head or neck.  Past treatment with radioactive iodine.  Past surgical removal of part or all of your thyroid.  Problems with the gland that is located in the center of your brain (pituitary).  What are the signs or symptoms? Signs and symptoms of hypothyroidism may include:  Feeling as though you have no energy (lethargy).  Inability to tolerate cold.  Weight gain that is not explained by a change in diet or exercise habits.  Dry skin.  Coarse hair.  Menstrual irregularity.  Slowing of thought processes.  Constipation.  Sadness or depression.  How is this diagnosed? Your health care provider may diagnose hypothyroidism with blood tests and ultrasound tests. How is this treated? Hypothyroidism is treated with medicine that replaces the hormones that your body does not make. After you begin treatment, it may take several weeks for symptoms to go away. Follow these instructions at home:  Take medicines only as directed by your health care provider.  If you start taking any new medicines, tell your health care provider.  Keep all follow-up visits as directed by your health care provider. This is important. As your condition improves, your dosage needs may change. You will need to have blood tests regularly so that your health care provider can watch your condition. Contact a health care provider if:  Your symptoms do not get better with treatment.  You are taking thyroid  replacement medicine and: ? You sweat excessively. ? You have tremors. ? You feel anxious. ? You lose weight rapidly. ? You cannot tolerate heat. ? You have emotional swings. ? You have diarrhea. ? You feel weak. Get help right away if:  You develop chest pain.  You develop an irregular heartbeat.  You develop a rapid heartbeat. This information is not intended to replace advice given to you by your health care provider. Make sure you discuss any questions you have with your health care provider. Document Released: 12/26/2004 Document Revised: 06/03/2015 Document Reviewed: 05/13/2013 Elsevier Interactive Patient Education  2017 [employer]  

## 2016-08-21 NOTE — Patient Instructions (Signed)
REMEMBER YOU ERGONOMICS AND DAILY STRETCHES.   TAKE REST BREAKS WHEN YOU CAN.   USE TYLENOL OR DICLOFENAC FOR PAIN FLARES. ALSO UTILIZE HEAT OR ICE.

## 2016-08-22 LAB — LIPID PANEL
CHOL/HDL RATIO: 3
Cholesterol: 157 mg/dL (ref 0–200)
HDL: 56.7 mg/dL (ref >39.00)
LDL Cholesterol: 77 mg/dL (ref 0–99)
NonHDL: 100.72
TRIGLYCERIDES: 121 mg/dL (ref 0.0–149.0)
VLDL: 24.2 mg/dL (ref 0.0–40.0)

## 2016-08-22 LAB — BASIC METABOLIC PANEL
BUN: 10 mg/dL (ref 6–23)
CALCIUM: 9.7 mg/dL (ref 8.4–10.5)
CO2: 29 mEq/L (ref 19–32)
Chloride: 103 mEq/L (ref 96–112)
Creatinine, Ser: 0.94 mg/dL (ref 0.40–1.50)
GFR: 104.29 mL/min (ref >60.00)
Glucose, Bld: 229 mg/dL — ABNORMAL HIGH (ref 70–99)
POTASSIUM: 4.3 meq/L (ref 3.5–5.1)
SODIUM: 139 meq/L (ref 135–145)

## 2016-08-22 LAB — HM DIABETES EYE EXAM

## 2016-08-22 LAB — TSH: TSH: 1.19 u[IU]/mL (ref 0.35–4.50)

## 2016-08-29 ENCOUNTER — Encounter: Payer: Self-pay | Admitting: Internal Medicine

## 2016-08-29 NOTE — Progress Notes (Signed)
Abstracted and sent to scan  

## 2016-09-19 ENCOUNTER — Other Ambulatory Visit: Payer: Self-pay | Admitting: Internal Medicine

## 2016-09-19 ENCOUNTER — Other Ambulatory Visit: Payer: Self-pay | Admitting: Endocrinology

## 2016-09-19 DIAGNOSIS — R42 Dizziness and giddiness: Secondary | ICD-10-CM

## 2016-09-19 DIAGNOSIS — I10 Essential (primary) hypertension: Secondary | ICD-10-CM

## 2016-09-19 MED ORDER — ALISKIREN FUMARATE 150 MG PO TABS: 150.0000 mg | ORAL_TABLET | Freq: Every day | ORAL | 1 refills | Status: DC

## 2016-10-13 DIAGNOSIS — H53412 Scotoma involving central area, left eye: Secondary | ICD-10-CM | POA: Diagnosis not present

## 2016-10-13 DIAGNOSIS — E119 Type 2 diabetes mellitus without complications: Secondary | ICD-10-CM | POA: Diagnosis not present

## 2016-10-23 ENCOUNTER — Other Ambulatory Visit: Payer: Self-pay | Admitting: Internal Medicine

## 2016-10-30 ENCOUNTER — Other Ambulatory Visit: Payer: Self-pay | Admitting: Internal Medicine

## 2016-12-27 ENCOUNTER — Encounter: Payer: Self-pay | Admitting: Endocrinology

## 2016-12-27 ENCOUNTER — Other Ambulatory Visit (INDEPENDENT_AMBULATORY_CARE_PROVIDER_SITE_OTHER): Payer: BLUE CROSS/BLUE SHIELD

## 2016-12-27 ENCOUNTER — Encounter: Payer: Self-pay | Admitting: Internal Medicine

## 2016-12-27 ENCOUNTER — Ambulatory Visit: Payer: BLUE CROSS/BLUE SHIELD | Admitting: Endocrinology

## 2016-12-27 ENCOUNTER — Ambulatory Visit: Payer: BLUE CROSS/BLUE SHIELD | Admitting: Internal Medicine

## 2016-12-27 VITALS — BP 150/60 | HR 75 | Temp 97.9°F | Resp 16 | Ht 71.0 in | Wt 218.5 lb

## 2016-12-27 VITALS — BP 152/82 | HR 72 | Wt 217.2 lb

## 2016-12-27 DIAGNOSIS — E118 Type 2 diabetes mellitus with unspecified complications: Secondary | ICD-10-CM

## 2016-12-27 DIAGNOSIS — Z0000 Encounter for general adult medical examination without abnormal findings: Secondary | ICD-10-CM

## 2016-12-27 DIAGNOSIS — I10 Essential (primary) hypertension: Secondary | ICD-10-CM | POA: Diagnosis not present

## 2016-12-27 DIAGNOSIS — E785 Hyperlipidemia, unspecified: Secondary | ICD-10-CM

## 2016-12-27 DIAGNOSIS — N529 Male erectile dysfunction, unspecified: Secondary | ICD-10-CM

## 2016-12-27 DIAGNOSIS — R0600 Dyspnea, unspecified: Secondary | ICD-10-CM

## 2016-12-27 DIAGNOSIS — Z23 Encounter for immunization: Secondary | ICD-10-CM | POA: Diagnosis not present

## 2016-12-27 DIAGNOSIS — R0609 Other forms of dyspnea: Secondary | ICD-10-CM

## 2016-12-27 DIAGNOSIS — E291 Testicular hypofunction: Secondary | ICD-10-CM

## 2016-12-27 DIAGNOSIS — E039 Hypothyroidism, unspecified: Secondary | ICD-10-CM | POA: Diagnosis not present

## 2016-12-27 DIAGNOSIS — R9431 Abnormal electrocardiogram [ECG] [EKG]: Secondary | ICD-10-CM | POA: Diagnosis not present

## 2016-12-27 LAB — CBC WITH DIFFERENTIAL/PLATELET
BASOS ABS: 0.1 10*3/uL (ref 0.0–0.1)
Basophils Relative: 1.2 % (ref 0.0–3.0)
EOS ABS: 0.1 10*3/uL (ref 0.0–0.7)
Eosinophils Relative: 1.4 % (ref 0.0–5.0)
HCT: 44.8 % (ref 39.0–52.0)
Hemoglobin: 14.9 g/dL (ref 13.0–17.0)
LYMPHS ABS: 2.6 10*3/uL (ref 0.7–4.0)
Lymphocytes Relative: 36.6 % (ref 12.0–46.0)
MCHC: 33.2 g/dL (ref 30.0–36.0)
MCV: 90.5 fl (ref 78.0–100.0)
MONOS PCT: 3.9 % (ref 3.0–12.0)
Monocytes Absolute: 0.3 10*3/uL (ref 0.1–1.0)
NEUTROS PCT: 56.9 % (ref 43.0–77.0)
Neutro Abs: 4.1 10*3/uL (ref 1.4–7.7)
Platelets: 297 10*3/uL (ref 150.0–400.0)
RBC: 4.94 Mil/uL (ref 4.22–5.81)
RDW: 14.6 % (ref 11.5–15.5)
WBC: 7.2 10*3/uL (ref 4.0–10.5)

## 2016-12-27 LAB — POCT GLYCOSYLATED HEMOGLOBIN (HGB A1C): Hemoglobin A1C: 8.2

## 2016-12-27 MED ORDER — HYDROCHLOROTHIAZIDE 25 MG PO TABS: 25.0000 mg | ORAL_TABLET | Freq: Every day | ORAL | 1 refills | Status: DC

## 2016-12-27 MED ORDER — SILDENAFIL CITRATE 100 MG PO TABS: 100.0000 mg | ORAL_TABLET | Freq: Every day | ORAL | 5 refills | Status: DC | PRN

## 2016-12-27 MED ORDER — EMPAGLIFLOZIN 10 MG PO TABS: 10.0000 mg | ORAL_TABLET | Freq: Every day | ORAL | 3 refills | Status: DC

## 2016-12-27 MED ORDER — DULAGLUTIDE 1.5 MG/0.5ML ~~LOC~~ SOAJ: 1.5000 mg | SUBCUTANEOUS | 3 refills | Status: DC

## 2016-12-27 MED ORDER — SILDENAFIL CITRATE 100 MG PO TABS: 100.0000 mg | ORAL_TABLET | Freq: Every day | ORAL | 0 refills | Status: DC | PRN

## 2016-12-27 NOTE — Progress Notes (Signed)
Subjective:  Patient ID: Terry Harrington, male    DOB: 09-08-1955  Age: 60 y.o. MRN: [id number]  CC: Annual Exam; Hypothyroidism; Hypertension; Hyperlipidemia; and Diabetes   HPI Terry Harrington presents for a CPX.  He complains of a several month history of DOE.  He has had these symptoms before.  He had a normal nuclear stress test 2.5 yrs ago. He denies chest pain, diaphoresis, edema, fatigue, or palpitations.  He reports that his blood pressure has been well controlled. He also thinks his thyroid dose is adequate as he has had no recent episodes of edema, constipation, or weight gain.  Outpatient Medications Prior to Visit  Medication Sig Dispense Refill  . aliskiren (TEKTURNA) 150 MG tablet Take 1 tablet (150 mg total) by mouth daily. 90 tablet 1  . cholecalciferol (VITAMIN D) 1000 UNITS tablet Take 2,000 Units by mouth daily.    . Dulaglutide (TRULICITY) 1.5 PY/0.9XI SOPN Inject 1.5 mg into the skin once a week. 12 pen 3  . empagliflozin (JARDIANCE) 10 MG TABS tablet Take 10 mg by mouth daily. 90 tablet 3  . levothyroxine (SYNTHROID, LEVOTHROID) 100 MCG tablet TAKE 1 TABLET BY MOUTH EVERY DAY 90 tablet 0  . pioglitazone-metformin (ACTOPLUS MET) 15-850 MG tablet TAKE 2 TABLETS EVERY DAY WITH SUPPER 180 tablet 1  . pravastatin (PRAVACHOL) 40 MG tablet Take 1 tablet (40 mg total) by mouth daily. 90 tablet 1  . sildenafil (VIAGRA) 100 MG tablet Take 1 tablet (100 mg total) by mouth daily as needed for erectile dysfunction. 30 tablet 5  . vitamin B-12 (CYANOCOBALAMIN) 1000 MCG tablet Take 2,000 mcg by mouth daily.     No facility-administered medications prior to visit.     ROS Review of Systems  Constitutional: Negative for appetite change, diaphoresis, fatigue and unexpected weight change.  HENT: Negative.   Eyes: Negative for visual disturbance.  Respiratory: Positive for shortness of breath. Negative for cough, chest tightness and stridor.   Cardiovascular: Negative for  chest pain, palpitations and leg swelling.  Gastrointestinal: Negative for abdominal pain, constipation, diarrhea, nausea and vomiting.  Endocrine: Negative.  Negative for cold intolerance and heat intolerance.  Genitourinary: Negative.  Negative for difficulty urinating, penile swelling, scrotal swelling and testicular pain.  Musculoskeletal: Negative.  Negative for arthralgias, back pain, myalgias and neck pain.  Skin: Negative.  Negative for color change, pallor and rash.  Allergic/Immunologic: Negative.   Neurological: Negative.  Negative for dizziness, weakness, light-headedness and numbness.  Hematological: Negative for adenopathy. Does not bruise/bleed easily.  Psychiatric/Behavioral: Negative.     Objective:  BP (!) 150/60 (BP Location: Left Arm, Patient Position: Sitting, Cuff Size: Normal)   Pulse 75   Temp 97.9 F (36.6 C) (Oral)   Resp 16   Ht '5\' 11"'$  (1.803 m)   Wt 218 lb 8 oz (99.1 kg)   SpO2 98%   BMI 30.47 kg/m   BP Readings from Last 3 Encounters:  12/27/16 (!) 150/60  12/27/16 (!) 152/82  08/21/16 (!) 148/60    Wt Readings from Last 3 Encounters:  12/27/16 218 lb 8 oz (99.1 kg)  12/27/16 217 lb 3.2 oz (98.5 kg)  08/21/16 217 lb (98.4 kg)    Physical Exam  Constitutional: No distress.  HENT:  Mouth/Throat: Oropharynx is clear and moist. No oropharyngeal exudate.  Eyes: Conjunctivae are normal. Left eye exhibits no discharge. No scleral icterus.  Neck: Normal range of motion. Neck supple. No JVD present. No thyromegaly present.  Cardiovascular: Normal rate,  regular rhythm and normal heart sounds. Exam reveals no gallop and no friction rub.  No murmur heard. EKG---  Sinus  Rhythm   WITHIN NORMAL LIMITS - no change from the prior EKG  Pulmonary/Chest: Effort normal and breath sounds normal. No respiratory distress. He has no wheezes. He has no rales. He exhibits no tenderness.  Abdominal: Soft. Bowel sounds are normal. He exhibits no distension and no  mass. There is no tenderness. There is no guarding. Hernia confirmed negative in the right inguinal area and confirmed negative in the left inguinal area.  Genitourinary: Rectum normal, prostate normal, testes normal and penis normal. Rectal exam shows no external hemorrhoid, no internal hemorrhoid, no fissure, no mass, no tenderness, anal tone normal and guaiac negative stool. Prostate is not enlarged and not tender. Right testis shows no mass, no swelling and no tenderness. Right testis is descended. Left testis shows no mass, no swelling and no tenderness. Left testis is descended. Circumcised. No penile erythema or penile tenderness. No discharge found.  Lymphadenopathy:    He has no cervical adenopathy.       Right: No inguinal adenopathy present.       Left: No inguinal adenopathy present.  Skin: He is not diaphoretic.  Vitals reviewed.   Lab Results  Component Value Date   WBC 7.2 12/27/2016   HGB 14.9 12/27/2016   HCT 44.8 12/27/2016   PLT 297.0 12/27/2016   GLUCOSE 193 (H) 12/27/2016   CHOL 157 08/21/2016   TRIG 121.0 08/21/2016   HDL 56.70 08/21/2016   LDLDIRECT 132.8 02/21/2013   LDLCALC 77 08/21/2016   ALT 11 06/16/2015   AST 12 06/16/2015   NA 139 12/27/2016   K 3.8 12/27/2016   CL 103 12/27/2016   CREATININE 1.10 12/27/2016   BUN 15 12/27/2016   CO2 24 12/27/2016   TSH 1.53 12/27/2016   PSA 0.24 12/27/2016   HGBA1C 8.2 12/27/2016   MICROALBUR 2.7 (H) 04/17/2016    Dg Lumbar Spine Complete W/bend  Result Date: 11/30/2015 CLINICAL DATA:  Back pain.  Scoliosis. EXAM: SACRUM AND COCCYX - 2+ VIEW; LUMBAR SPINE - COMPLETE WITH BENDING VIEWS COMPARISON:  CT 04/17/2003. FINDINGS: Lower thoracic upper lumbar scoliosis concave left 29 degrees. Mid to lower lumbar scoliosis concave right 20 degrees. SI joints intact. Sacrum is intact. No focal bony abnormality identified. IMPRESSION: 1. Thoracolumbar scoliosis as above. No acute or focal bony abnormality identified. 2.  Aortoiliac atherosclerotic vascular disease. Electronically Signed   By: Marcello Moores  Register   On: 11/30/2015 10:59   Dg Sacrum/coccyx  Result Date: 11/30/2015 CLINICAL DATA:  Back pain.  Scoliosis. EXAM: SACRUM AND COCCYX - 2+ VIEW; LUMBAR SPINE - COMPLETE WITH BENDING VIEWS COMPARISON:  CT 04/17/2003. FINDINGS: Lower thoracic upper lumbar scoliosis concave left 29 degrees. Mid to lower lumbar scoliosis concave right 20 degrees. SI joints intact. Sacrum is intact. No focal bony abnormality identified. IMPRESSION: 1. Thoracolumbar scoliosis as above. No acute or focal bony abnormality identified. 2. Aortoiliac atherosclerotic vascular disease. Electronically Signed   By: Marcello Moores  Register   On: 11/30/2015 10:59    Assessment & Plan:   [place] was seen today for annual exam, hypothyroidism, hypertension, hyperlipidemia and diabetes.  Diagnoses and all orders for this visit:  Need for influenza vaccination -     Flu Vaccine QUAD 36+ mos IM  DOE (dyspnea on exertion)- He experiences DOE but has no other symptoms suspicious for angina.  His labs are negative for any secondary causes of shortness  of breath.  His EKG remains normal.  I think his symptoms are related to poor conditioning and obesity.  I do not see any evidence of ischemia at this time.  I have encouraged him to be more active. -     CBC with Differential/Platelet; Future -     EKG 12-Lead  Essential hypertension- His blood pressure is well controlled.  Electrolytes and renal function are normal.  His EKG is negative for LVH. -     Basic metabolic panel; Future -     EKG 12-Lead -     hydrochlorothiazide (HYDRODIURIL) 25 MG tablet; Take 1 tablet (25 mg total) by mouth daily.  Acquired hypothyroidism - His TSH is in the normal range.  He will remain on the current dose of levothyroxine. -     TSH; Future  Type 2 diabetes mellitus with complication, without long-term current use of insulin ([place])- His A1c is up to 8.2%.  His blood  sugars are managed by endocrinology. -     Basic metabolic panel; Future  Hyperlipidemia with target LDL less than 100- He has achieved his LDL goal and is doing well on the statin.  Nonspecific abnormal electrocardiogram (ECG) (EKG)  Routine general medical examination at a health care facility- Exam completed, labs ordered and reviewed, vaccines reviewed, screening for colon cancer is up-to-date, patient education material was given. -     PSA; Future -     Hepatitis C antibody; Future -     HIV antibody; Future   I am having [name] start on hydrochlorothiazide. I am also having him maintain his vitamin B-12, cholecalciferol, pravastatin, aliskiren, pioglitazone-metformin, levothyroxine, empagliflozin, Dulaglutide, and sildenafil.  Meds ordered this encounter  Medications  . hydrochlorothiazide (HYDRODIURIL) 25 MG tablet    Sig: Take 1 tablet (25 mg total) by mouth daily.    Dispense:  90 tablet    Refill:  1     Follow-up: Return in about 3 months (around 03/27/2017).  Scarlette Calico, MD

## 2016-12-27 NOTE — Progress Notes (Signed)
Subjective:    Patient ID: Terry Harrington, male    DOB: December 15, 1955, 61 y.o.   MRN: [id number]  HPI Pt returns for f/u of diabetes mellitus: DM type: 2 Dx'ed: 6468 Complications: none.   Therapy: 4 oral meds.  DKA: never.  Severe hypoglycemia: never.  Pancreatitis: never Other: he took insulin 2015-2017; in early 2017, he was hospitalized for nonketotic hyperosmolar hyperglycemic state, due to missing insulin doses, he works M-F, day shift.  He says cbg's are higher on weekends.  Interval history: no cbg record, but states cbg's are well-controlled.  He stopped farxiga, due to arthralgias.  pt states he feels well in general.  Past Medical History:  Diagnosis Date  . Diabetes mellitus   . GERD (gastroesophageal reflux disease)   . Heart murmur   . Hyperlipidemia   . Hypertension    on meds  . Hypothyroidism    on meds    Past Surgical History:  Procedure Laterality Date  . DENTAL SURGERY     several dental procedure approx 10 between 1999-2012  . HERNIA REPAIR     as a child  . SHOULDER ARTHROSCOPY  05/31/2011   Procedure: ARTHROSCOPY SHOULDER;  Surgeon: Sharmon Revere, MD;  Location: [place];  Service: Orthopedics;  Laterality: Left;  Shoulder Acromioplasty Left  . SHOULDER SURGERY  05/31/2011    impingment left    Social History   Socioeconomic History  . Marital status: Divorced    Spouse name: Not on file  . Number of children: Not on file  . Years of education: Not on file  . Highest education level: Not on file  Social Needs  . Financial resource strain: Not on file  . Food insecurity - worry: Not on file  . Food insecurity - inability: Not on file  . Transportation needs - medical: Not on file  . Transportation needs - non-medical: Not on file  Occupational History  . Occupation: Architect  Tobacco Use  . Smoking status: Former Smoker    Packs/day: 1.50    Years: 26.00    Pack years: 39.00    Types: Cigarettes    Last attempt to quit: 03/02/2008      Years since quitting: 8.8  . Smokeless tobacco: Former Systems developer    Quit date: 05/25/2008  Substance and Sexual Activity  . Alcohol use: Yes    Alcohol/week: 0.0 oz    Comment: rarely  . Drug use: No  . Sexual activity: Not Currently  Other Topics Concern  . Not on file  Social History Narrative  . Not on file    Current Outpatient Medications on File Prior to Visit  Medication Sig Dispense Refill  . aliskiren (TEKTURNA) 150 MG tablet Take 1 tablet (150 mg total) by mouth daily. 90 tablet 1  . cholecalciferol (VITAMIN D) 1000 UNITS tablet Take 2,000 Units by mouth daily.    Marland Kitchen levothyroxine (SYNTHROID, LEVOTHROID) 100 MCG tablet TAKE 1 TABLET BY MOUTH EVERY DAY 90 tablet 0  . pioglitazone-metformin (ACTOPLUS MET) 15-850 MG tablet TAKE 2 TABLETS EVERY DAY WITH SUPPER 180 tablet 1  . pravastatin (PRAVACHOL) 40 MG tablet Take 1 tablet (40 mg total) by mouth daily. 90 tablet 1  . vitamin B-12 (CYANOCOBALAMIN) 1000 MCG tablet Take 2,000 mcg by mouth daily.     No current facility-administered medications on file prior to visit.     Allergies  Allergen Reactions  . Crab [Shellfish Allergy] Anaphylaxis  . Iodine Anaphylaxis  . Lisinopril  cough  . Crestor [Rosuvastatin]     Muscle aches  . Wilder Glade [Dapagliflozin] Other (See Comments)    Joint pain and weakness  . Iohexol Swelling  . Peanut-Containing Drug Products Itching    And whelps  . Aspirin Rash  . Codeine Rash  . Penicillins Rash    Has patient had a PCN reaction causing immediate rash, facial/tongue/throat swelling, SOB or lightheadedness with hypotension: Yes Has patient had a PCN reaction causing severe rash involving mucus membranes or skin necrosis: Yes Has patient had a PCN reaction that required hospitalization No Has patient had a PCN reaction occurring within the last 10 years: No If all of the above answers are "NO", then may proceed with Cephalosporin use.     Family History  Problem Relation Age of  Onset  . Hyperlipidemia Mother   . Hypertension Mother   . Stroke Father   . Kidney disease Father   . Diabetes Father   . Cancer Maternal Grandfather   . Anesthesia problems Neg Hx   . Hypotension Neg Hx   . Malignant hyperthermia Neg Hx   . Pseudochol deficiency Neg Hx     BP (!) 152/82 (BP Location: Left Arm, Patient Position: Sitting, Cuff Size: Normal)   Pulse 72   Wt 217 lb 3.2 oz (98.5 kg)   SpO2 95%   BMI 30.29 kg/m    Review of Systems Vertigo is worse recently.  He has ED sxs    Objective:   Physical Exam VITAL SIGNS:  See vs page GENERAL: no distress Pulses: foot pulses are intact bilaterally.   MSK: no deformity of the feet or ankles.  CV: no edema of the legs or ankles Skin:  no ulcer on the feet or ankles.  normal color and temp on the feet and ankles Neuro: sensation is intact to touch on the feet and ankles.   Ext: There is bilateral onychomycosis of the toenails.   Lab Results  Component Value Date   HGBA1C 8.2 12/27/2016   Lab Results  Component Value Date   CREATININE 0.94 08/21/2016   BUN 10 08/21/2016   NA 139 08/21/2016   K 4.3 08/21/2016   CL 103 08/21/2016   CO2 29 08/21/2016       Assessment & Plan:  Vertigo: this is a relative contraindication to bromocriptine  Type 2 DM: worse.  ED: new to me.   Patient Instructions  check your blood sugar twice a day.  vary the time of day when you check, between before the 3 meals, and at bedtime.  also check if you have symptoms of your blood sugar being too high or too low.  please keep a record of the readings and bring it to your next appointment here (or you can bring the meter itself).  You can write it on any piece of paper.  please call us sooner if your blood sugar goes below 70, or if you have a lot of readings over 200.   I have sent a prescription to your pharmacy, to change januvia to "trulicity." blood tests are requested for you today.  We'll let you know about the results. Please  come back for a follow-up appointment in 3-4 months.

## 2016-12-27 NOTE — Patient Instructions (Addendum)
check your blood sugar twice a day.  vary the time of day when you check, between before the 3 meals, and at bedtime.  also check if you have symptoms of your blood sugar being too high or too low.  please keep a record of the readings and bring it to your next appointment here (or you can bring the meter itself).  You can write it on any piece of paper.  please call us sooner if your blood sugar goes below 70, or if you have a lot of readings over 200.   I have sent a prescription to your pharmacy, to change januvia to "trulicity." blood tests are requested for you today.  We'll let you know about the results. Please come back for a follow-up appointment in 3-4 months.

## 2016-12-27 NOTE — Patient Instructions (Signed)

## 2016-12-28 ENCOUNTER — Other Ambulatory Visit: Payer: Self-pay | Admitting: Internal Medicine

## 2016-12-28 DIAGNOSIS — E291 Testicular hypofunction: Secondary | ICD-10-CM | POA: Insufficient documentation

## 2016-12-28 DIAGNOSIS — R42 Dizziness and giddiness: Secondary | ICD-10-CM

## 2016-12-28 LAB — BASIC METABOLIC PANEL
BUN: 15 mg/dL (ref 6–23)
CALCIUM: 9.7 mg/dL (ref 8.4–10.5)
CO2: 24 mEq/L (ref 19–32)
Chloride: 103 mEq/L (ref 96–112)
Creatinine, Ser: 1.1 mg/dL (ref 0.40–1.50)
GFR: 86.89 mL/min (ref >60.00)
GLUCOSE: 193 mg/dL — AB (ref 70–99)
Potassium: 3.8 mEq/L (ref 3.5–5.1)
Sodium: 139 mEq/L (ref 135–145)

## 2016-12-28 LAB — TSH: TSH: 1.53 u[IU]/mL (ref 0.35–4.50)

## 2016-12-28 LAB — PSA: PSA: 0.24 ng/mL (ref 0.10–4.00)

## 2016-12-29 LAB — TESTOSTERONE, FREE, TOTAL, SHBG
SEX HORMONE BINDING: 25 nmol/L (ref 19.3–76.4)
Testosterone, Free: 2.1 pg/mL — ABNORMAL LOW (ref 6.6–18.1)
Testosterone: 71 ng/dL — ABNORMAL LOW (ref 264–916)

## 2017-01-03 ENCOUNTER — Other Ambulatory Visit: Payer: BLUE CROSS/BLUE SHIELD

## 2017-01-03 DIAGNOSIS — Z0000 Encounter for general adult medical examination without abnormal findings: Secondary | ICD-10-CM | POA: Diagnosis not present

## 2017-01-03 LAB — LUTEINIZING HORMONE: LH: 22.9 m[IU]/mL — ABNORMAL HIGH (ref 1.7–8.6)

## 2017-01-03 LAB — SPECIMEN STATUS REPORT

## 2017-01-03 LAB — PROLACTIN: PROLACTIN: 11.1 ng/mL (ref 4.0–15.2)

## 2017-01-04 LAB — HEPATITIS C ANTIBODY
Hepatitis C Ab: NONREACTIVE
SIGNAL TO CUT-OFF: 0.08 (ref <1.00)

## 2017-01-04 LAB — HIV ANTIBODY (ROUTINE TESTING W REFLEX): HIV: NONREACTIVE

## 2017-01-05 ENCOUNTER — Telehealth: Payer: Self-pay

## 2017-01-05 NOTE — Telephone Encounter (Signed)
Patient called today to lab results and I gave them to him- he verbalized an understanding

## 2017-01-08 ENCOUNTER — Telehealth: Payer: Self-pay | Admitting: Endocrinology

## 2017-01-08 NOTE — Telephone Encounter (Signed)
I need more info about message, thanks.

## 2017-01-08 NOTE — Telephone Encounter (Signed)
-----   Message from Davis GourdSarah E Terrell, CMA sent at 01/08/2017  8:20 AM EST ----- See note. Please advise?

## 2017-01-10 ENCOUNTER — Emergency Department (HOSPITAL_BASED_OUTPATIENT_CLINIC_OR_DEPARTMENT_OTHER): Payer: BLUE CROSS/BLUE SHIELD

## 2017-01-10 ENCOUNTER — Encounter (HOSPITAL_BASED_OUTPATIENT_CLINIC_OR_DEPARTMENT_OTHER): Payer: Self-pay | Admitting: Emergency Medicine

## 2017-01-10 ENCOUNTER — Emergency Department (HOSPITAL_BASED_OUTPATIENT_CLINIC_OR_DEPARTMENT_OTHER)
Admission: EM | Admit: 2017-01-10 | Discharge: 2017-01-10 | Disposition: A | Discharge status: Home / Self Care | Payer: BLUE CROSS/BLUE SHIELD | Attending: Emergency Medicine | Admitting: Emergency Medicine

## 2017-01-10 DIAGNOSIS — R0602 Shortness of breath: Secondary | ICD-10-CM | POA: Diagnosis not present

## 2017-01-10 DIAGNOSIS — E039 Hypothyroidism, unspecified: Secondary | ICD-10-CM | POA: Insufficient documentation

## 2017-01-10 DIAGNOSIS — E876 Hypokalemia: Secondary | ICD-10-CM

## 2017-01-10 DIAGNOSIS — R5383 Other fatigue: Secondary | ICD-10-CM | POA: Diagnosis not present

## 2017-01-10 DIAGNOSIS — Z79899 Other long term (current) drug therapy: Secondary | ICD-10-CM | POA: Diagnosis not present

## 2017-01-10 DIAGNOSIS — E119 Type 2 diabetes mellitus without complications: Secondary | ICD-10-CM | POA: Insufficient documentation

## 2017-01-10 DIAGNOSIS — R42 Dizziness and giddiness: Secondary | ICD-10-CM | POA: Diagnosis not present

## 2017-01-10 DIAGNOSIS — I10 Essential (primary) hypertension: Secondary | ICD-10-CM | POA: Insufficient documentation

## 2017-01-10 DIAGNOSIS — Z91010 Allergy to peanuts: Secondary | ICD-10-CM | POA: Diagnosis not present

## 2017-01-10 DIAGNOSIS — Z87891 Personal history of nicotine dependence: Secondary | ICD-10-CM | POA: Insufficient documentation

## 2017-01-10 LAB — BASIC METABOLIC PANEL
Anion gap: 13 (ref 5–15)
BUN: 20 mg/dL (ref 6–20)
CO2: 26 mmol/L (ref 22–32)
Calcium: 9.8 mg/dL (ref 8.9–10.3)
Chloride: 91 mmol/L — ABNORMAL LOW (ref 101–111)
Creatinine, Ser: 1.52 mg/dL — ABNORMAL HIGH (ref 0.61–1.24)
GFR calc Af Amer: 55 mL/min — ABNORMAL LOW (ref >60)
GFR calc non Af Amer: 47 mL/min — ABNORMAL LOW (ref >60)
Glucose, Bld: 173 mg/dL — ABNORMAL HIGH (ref 65–99)
Potassium: 3.2 mmol/L — ABNORMAL LOW (ref 3.5–5.1)
Sodium: 130 mmol/L — ABNORMAL LOW (ref 135–145)

## 2017-01-10 LAB — CBC WITH DIFFERENTIAL/PLATELET
Basophils Absolute: 0 10*3/uL (ref 0.0–0.1)
Basophils Relative: 0 %
Eosinophils Absolute: 0 10*3/uL (ref 0.0–0.7)
Eosinophils Relative: 0 %
HCT: 45.2 % (ref 39.0–52.0)
Hemoglobin: 15.9 g/dL (ref 13.0–17.0)
Lymphocytes Relative: 31 %
Lymphs Abs: 1.9 10*3/uL (ref 0.7–4.0)
MCH: 29.9 pg (ref 26.0–34.0)
MCHC: 35.2 g/dL (ref 30.0–36.0)
MCV: 85.1 fL (ref 78.0–100.0)
Monocytes Absolute: 0.4 10*3/uL (ref 0.1–1.0)
Monocytes Relative: 7 %
Neutro Abs: 3.8 10*3/uL (ref 1.7–7.7)
Neutrophils Relative %: 62 %
Platelets: 305 10*3/uL (ref 150–400)
RBC: 5.31 MIL/uL (ref 4.22–5.81)
RDW: 13.5 % (ref 11.5–15.5)
WBC: 6.2 10*3/uL (ref 4.0–10.5)

## 2017-01-10 LAB — URINALYSIS, ROUTINE W REFLEX MICROSCOPIC
Bilirubin Urine: NEGATIVE
Glucose, UA: >=500 mg/dL — AB
Hgb urine dipstick: NEGATIVE
Ketones, ur: NEGATIVE mg/dL
Leukocytes, UA: NEGATIVE
Nitrite: NEGATIVE
Protein, ur: NEGATIVE mg/dL
Specific Gravity, Urine: 1.02 (ref 1.005–1.030)
pH: 5.5 (ref 5.0–8.0)

## 2017-01-10 LAB — URINALYSIS, MICROSCOPIC (REFLEX)

## 2017-01-10 MED ORDER — ONDANSETRON HCL 4 MG/2ML IJ SOLN
4.0000 mg | Freq: Once | INTRAMUSCULAR | Status: AC
  Administered 2017-01-10: 4 mg via INTRAVENOUS
  Filled 2017-01-10: qty 2

## 2017-01-10 MED ORDER — SODIUM CHLORIDE 0.9 % IV BOLUS (SEPSIS)
1000.0000 mL | Freq: Once | INTRAVENOUS | Status: AC
  Administered 2017-01-10: 1000 mL via INTRAVENOUS

## 2017-01-10 MED ORDER — POTASSIUM CHLORIDE CRYS ER 20 MEQ PO TBCR
60.0000 meq | EXTENDED_RELEASE_TABLET | Freq: Once | ORAL | Status: AC
  Administered 2017-01-10: 60 meq via ORAL
  Filled 2017-01-10: qty 3

## 2017-01-10 NOTE — ED Triage Notes (Signed)
Pt arrives c/o lightheadness, SHOB, and fever. Pt states this started approx 2 days after getting flu vaccine approx 1.5 weeks ago. Pt also c/o of no BM for at least a week. Denies sick contacts. But endorses malaise and myalgias.

## 2017-01-10 NOTE — ED Notes (Signed)
X-ray at bedside

## 2017-01-10 NOTE — ED Notes (Signed)
Pt and family understood dc material. NAD noted. Work excuses given at [employer] [employer]

## 2017-01-10 NOTE — ED Provider Notes (Signed)
[facility] EMERGENCY DEPARTMENT Provider Note   CSN: [id number] Arrival date & time: 01/10/17  7371     History   Chief Complaint Chief Complaint  Patient presents with  . Fatigue    HPI BUZZ AXEL is a 62 y.o. male.  HPI   62 year old male with generalized weakness.  About 2 weeks ago he reports that he went and saw his doctor for his annual exam and had a flu shot.  Feels like symptoms began about a day later.  He has generalized fatigue.  No focal weakness.  He was like he has no energy.  Some mild myalgias.  Subjective fever.  No cough.  No night sweats.  No significant weight loss.  Intermittent nausea, but no vomiting.  He has not had much of an appetite.  No urinary complaints.  No rash.  Past Medical History:  Diagnosis Date  . Diabetes mellitus   . GERD (gastroesophageal reflux disease)   . Heart murmur   . Hyperlipidemia   . Hypertension    on meds  . Hypothyroidism    on meds    Patient Active Problem List   Diagnosis Date Noted  . Hypogonadism male 12/28/2016  . Erectile dysfunction 12/27/2016  . Routine general medical examination at a health care facility 12/27/2016  . Idiopathic scoliosis 11/30/2015  . Middle insomnia 02/18/2015  . DOE (dyspnea on exertion) 07/27/2014  . Nonspecific abnormal electrocardiogram (ECG) (EKG) 07/27/2014  . Snoring 03/13/2014  . Hyperlipidemia with target LDL less than 100 06/20/2013  . Hypothyroidism 06/20/2013  . Personal history of colonic polyps 06/20/2013  . Type II diabetes mellitus with manifestations ([place]) 03/22/2013  . HTN (hypertension) 03/22/2013    Past Surgical History:  Procedure Laterality Date  . DENTAL SURGERY     several dental procedure approx 10 between 1999-2012  . HERNIA REPAIR     as a child  . SHOULDER ARTHROSCOPY  05/31/2011   Procedure: ARTHROSCOPY SHOULDER;  Surgeon: Sharmon Revere, MD;  Location: [facility];  Service: Orthopedics;  Laterality: Left;  Shoulder Acromioplasty  Left  . SHOULDER SURGERY  05/31/2011    impingment left       Home Medications    Prior to Admission medications   Medication Sig Start Date End Date Taking? Authorizing Provider  aliskiren (TEKTURNA) 150 MG tablet Take 1 tablet (150 mg total) by mouth daily. 09/19/16   Janith Lima, MD  cholecalciferol (VITAMIN D) 1000 UNITS tablet Take 2,000 Units by mouth daily.    [provider]  Dulaglutide (TRULICITY) 1.5 GG/2.6RS SOPN Inject 1.5 mg into the skin once a week. 12/27/16   Renato Shin, MD  empagliflozin (JARDIANCE) 10 MG TABS tablet Take 10 mg by mouth daily. 12/27/16   Renato Shin, MD  hydrochlorothiazide (HYDRODIURIL) 25 MG tablet Take 1 tablet (25 mg total) by mouth daily. 12/27/16   Janith Lima, MD  levothyroxine (SYNTHROID, LEVOTHROID) 100 MCG tablet TAKE 1 TABLET BY MOUTH EVERY DAY 10/30/16   Janith Lima, MD  meclizine (ANTIVERT) 25 MG tablet TAKE 1 TABLET BY MOUTH 3 TIMES DAILY AS NEEDED FOR DIZZINESS. 12/29/16   Janith Lima, MD  pioglitazone-metformin (ACTOPLUS MET) [phone] MG tablet TAKE 2 TABLETS EVERY DAY WITH SUPPER 10/24/16   Janith Lima, MD  pravastatin (PRAVACHOL) 40 MG tablet Take 1 tablet (40 mg total) by mouth daily. 08/18/16   Janith Lima, MD  sildenafil (VIAGRA) 100 MG tablet Take 1 tablet (100 mg total)  by mouth daily as needed for erectile dysfunction. 12/27/16   Renato Shin, MD  vitamin B-12 (CYANOCOBALAMIN) 1000 MCG tablet Take 2,000 mcg by mouth daily.    [provider]    Family History Family History  Problem Relation Age of Onset  . Hyperlipidemia Mother   . Hypertension Mother   . Stroke Father   . Kidney disease Father   . Diabetes Father   . Cancer Maternal Grandfather   . Anesthesia problems Neg Hx   . Hypotension Neg Hx   . Malignant hyperthermia Neg Hx   . Pseudochol deficiency Neg Hx     Social History Social History   Tobacco Use  . Smoking status: Former Smoker    Packs/day: 1.50     Years: 26.00    Pack years: 39.00    Types: Cigarettes    Last attempt to quit: 03/02/2008    Years since quitting: 8.8  . Smokeless tobacco: Former Systems developer    Quit date: 05/25/2008  Substance Use Topics  . Alcohol use: Yes    Alcohol/week: 0.0 oz    Comment: rarely  . Drug use: No     Allergies   Crab [shellfish allergy]; Iodine; Lisinopril; Crestor [rosuvastatin]; Farxiga [dapagliflozin]; Iohexol; Peanut-containing drug products; Aspirin; Codeine; and Penicillins   Review of Systems Review of Systems  All systems reviewed and negative, other than as noted in HPI.   Physical Exam Updated Vital Signs BP (!) 127/56   Pulse 69   Temp 99.3 F (37.4 C) (Rectal)   Resp 18   Ht '5\' 11"'$  (1.803 m)   Wt 98.9 kg (218 lb)   SpO2 100%   BMI 30.40 kg/m   Physical Exam  Constitutional: He appears well-developed and well-nourished. No distress.  HENT:  Head: Normocephalic and atraumatic.  Eyes: Conjunctivae are normal. Right eye exhibits no discharge. Left eye exhibits no discharge.  Neck: Neck supple.  Cardiovascular: Normal rate, regular rhythm and normal heart sounds. Exam reveals no gallop and no friction rub.  No murmur heard. Pulmonary/Chest: Effort normal and breath sounds normal. No respiratory distress.  Abdominal: Soft. He exhibits no distension. There is no tenderness.  Musculoskeletal: He exhibits no edema or tenderness.  Neurological: He is alert.  Skin: Skin is warm and dry.  Psychiatric: He has a normal mood and affect. His behavior is normal. Thought content normal.  Nursing note and vitals reviewed.    ED Treatments / Results  Labs (all labs ordered are listed, but only abnormal results are displayed) Labs Reviewed  BASIC METABOLIC PANEL - Abnormal; Notable for the following components:      Result Value   Sodium 130 (*)    Potassium 3.2 (*)    Chloride 91 (*)    Glucose, Bld 173 (*)    Creatinine, Ser 1.52 (*)    GFR calc non Af Amer 47 (*)    GFR calc  Af Amer 55 (*)    All other components within normal limits  URINALYSIS, ROUTINE W REFLEX MICROSCOPIC - Abnormal; Notable for the following components:   Glucose, UA >=500 (*)    All other components within normal limits  URINALYSIS, MICROSCOPIC (REFLEX) - Abnormal; Notable for the following components:   Bacteria, UA FEW (*)    Squamous Epithelial / LPF 0-5 (*)    All other components within normal limits  CBC WITH DIFFERENTIAL/PLATELET    EKG  EKG Interpretation None       Radiology Dg Chest Portable 1 View  Result Date: 01/10/2017 CLINICAL DATA:  Shortness of breath with dizziness and fatigue. EXAM: PORTABLE CHEST 1 VIEW COMPARISON:  06/20/2013 FINDINGS: 0842 hours. The lungs are clear without focal pneumonia, edema, pneumothorax or pleural effusion. The cardiopericardial silhouette is within normal limits for size. Thoracolumbar scoliosis again noted. IMPRESSION: Stable. Thoracolumbar scoliosis without acute cardiopulmonary findings. Electronically Signed   By: Misty Stanley M.D.   On: 01/10/2017 09:19    Procedures Procedures (including critical care time)  Medications Ordered in ED Medications  sodium chloride 0.9 % bolus 1,000 mL (0 mLs Intravenous Stopped 01/10/17 1035)  ondansetron (ZOFRAN) injection 4 mg (4 mg Intravenous Given 01/10/17 0907)  sodium chloride 0.9 % bolus 1,000 mL (1,000 mLs Intravenous New Bag/Given 01/10/17 1035)  potassium chloride SA (K-DUR,KLOR-CON) CR tablet 60 mEq (60 mEq Oral Given 01/10/17 1035)     Initial Impression / Assessment and Plan / ED Course  I have reviewed the triage vital signs and the nursing notes.  Pertinent labs & imaging results that were available during my care of the patient were reviewed by me and considered in my medical decision making (see chart for details).     62 year old male with multiple vague complaints.  Essentially he just doesn't feel like his normal self and has little energy.  He reports subjective fevers,  although he is afebrile in the emergency room.  Not concerned about Guillain Barre after vaccine. He is generally tired/weak. No focality on exam.   I reviewed the note from his recent physical.  He was started on hydrochlorothiazide.  Labs today are significant for new hyponatremia, hypokalemia and a bump in his creatinine.  Symptoms seemed to start shortly after starting HCTZ.  Hydrochlorothiazide could potentially explain lab abnormalities and also some some of his symptoms.  At this point, I advised him to stop it.  I would like for him to have repeat blood work in about a week or so and follow-up with his PCP.  I doubt emergent process otherwise.  Return precautions were discussed though.  Outpatient follow-up otherwise.  Final Clinical Impressions(s) / ED Diagnoses   Final diagnoses:  Other fatigue  Hypokalemia    ED Discharge Orders    None       Virgel Manifold, MD 01/10/17 1110

## 2017-01-11 ENCOUNTER — Other Ambulatory Visit: Payer: Self-pay | Admitting: Internal Medicine

## 2017-01-11 ENCOUNTER — Telehealth: Payer: Self-pay | Admitting: Internal Medicine

## 2017-01-11 DIAGNOSIS — I10 Essential (primary) hypertension: Secondary | ICD-10-CM

## 2017-01-11 NOTE — Telephone Encounter (Signed)
Lab ordered.

## 2017-01-11 NOTE — Telephone Encounter (Signed)
Copied from CRM [phone]. Topic: Quick Communication - See Telephone Encounter >> Jan 11, 2017  9:17 AM Trula SladeWalter, Linda F wrote: CRM for notification. See Telephone encounter for:  01/11/17. Patient said [facility] had [facility] and they said the Diarhetic -  Hydrochlorothi  Dr. Yetta BarreJones put him on was making his Potassium and Sodium decrease tremendously, and they told him to stop taking that and get a blood test done in about two weeks to make sure his Potassium and Sodium levels were normal. They said Dr. Yetta BarreJones can decide rather to put him on another Diarhetic or not.  Please advise.  [phone]

## 2017-01-11 NOTE — Telephone Encounter (Signed)
Provider not in this office. Will send to PCP.

## 2017-01-29 ENCOUNTER — Telehealth: Payer: Self-pay | Admitting: Endocrinology

## 2017-01-29 ENCOUNTER — Other Ambulatory Visit: Payer: Self-pay

## 2017-01-29 NOTE — Telephone Encounter (Signed)
Cover my Meds rep called and needs to speak with to verify insurance and about a medication .  Terry Harrington.  [phone]   Reff# [id number]

## 2017-01-30 NOTE — Telephone Encounter (Signed)
I talked to cover my meds & they do not have correct insurance information either. PA would not go through. We have an updated insurance card but not the correct prescription drug plan information.

## 2017-02-02 ENCOUNTER — Other Ambulatory Visit (HOSPITAL_COMMUNITY)
Admission: RE | Admit: 2017-02-02 | Discharge: 2017-02-02 | Disposition: A | Discharge status: Home / Self Care | Payer: BLUE CROSS/BLUE SHIELD | Source: Ambulatory Visit | Attending: Endocrinology | Admitting: Endocrinology

## 2017-02-02 ENCOUNTER — Encounter: Payer: Self-pay | Admitting: Endocrinology

## 2017-02-02 ENCOUNTER — Ambulatory Visit: Payer: BLUE CROSS/BLUE SHIELD | Admitting: Endocrinology

## 2017-02-02 VITALS — BP 162/80 | HR 76 | Wt 211.8 lb

## 2017-02-02 DIAGNOSIS — E118 Type 2 diabetes mellitus with unspecified complications: Secondary | ICD-10-CM | POA: Diagnosis not present

## 2017-02-02 DIAGNOSIS — E291 Testicular hypofunction: Secondary | ICD-10-CM | POA: Insufficient documentation

## 2017-02-02 LAB — IBC PANEL
Iron: 60 ug/dL (ref 42–165)
SATURATION RATIOS: 15 % — AB (ref 20.0–50.0)
TRANSFERRIN: 285 mg/dL (ref 212.0–360.0)

## 2017-02-02 MED ORDER — TESTOSTERONE 12.5 MG/ACT (1%) TD GEL: 1.0000 | Freq: Every day | TRANSDERMAL | 0 refills | Status: DC

## 2017-02-02 NOTE — Patient Instructions (Addendum)
check your blood sugar twice a day.  vary the time of day when you check, between before the 3 meals, and at bedtime.  also check if you have symptoms of your blood sugar being too high or too low.  please keep a record of the readings and bring it to your next appointment here (or you can bring the meter itself).  You can write it on any piece of paper.  please call us sooner if your blood sugar goes below 70, or if you have a lot of readings over 200.   I have sent a prescription to your pharmacy, to resume the androgel.   blood tests are requested for you today.  We'll let you know about the results.  Please come back for a follow-up appointment in 2 months.

## 2017-02-02 NOTE — Progress Notes (Signed)
Subjective:    Patient ID: Terry Harrington, male    DOB: [birth year], 62 y.o.   MRN: [id number]  HPI Pt returns for f/u of diabetes mellitus: DM type: 2 Dx'ed: 4166 Complications: none.   Therapy: trulicity and 3 oral meds.  DKA: never.  Severe hypoglycemia: never.  Pancreatitis: never Other: he took insulin 2015-2017; in early 2017, he was hospitalized for nonketotic hyperosmolar hyperglycemic state, due to missing insulin doses, he did not tolerate farxiga (arthralgias); he works M-F, day shift.  He says cbg's are higher on weekends.  Interval history: no cbg record, but states cbg's are in the low to mid-100's.  pt states he feels well in general.   Pt also has primary hypogonadism (dx'ed 2018; he has lifelong infertility; he took androgel 2012-2014).  He has ED sxs  Past Medical History:  Diagnosis Date  . Diabetes mellitus   . GERD (gastroesophageal reflux disease)   . Heart murmur   . Hyperlipidemia   . Hypertension    on meds  . Hypothyroidism    on meds    Past Surgical History:  Procedure Laterality Date  . DENTAL SURGERY     several dental procedure approx 10 between 1999-2012  . HERNIA REPAIR     as a child  . SHOULDER ARTHROSCOPY  05/31/2011   Procedure: ARTHROSCOPY SHOULDER;  Surgeon: Sharmon Revere, MD;  Location: [facility];  Service: Orthopedics;  Laterality: Left;  Shoulder Acromioplasty Left  . SHOULDER SURGERY  05/31/2011    impingment left    Social History   Socioeconomic History  . Marital status: Divorced    Spouse name: Not on file  . Number of children: Not on file  . Years of education: Not on file  . Highest education level: Not on file  Social Needs  . Financial resource strain: Not on file  . Food insecurity - worry: Not on file  . Food insecurity - inability: Not on file  . Transportation needs - medical: Not on file  . Transportation needs - non-medical: Not on file  Occupational History  . Occupation: Architect  Tobacco Use  .  Smoking status: Former Smoker    Packs/day: 1.50    Years: 26.00    Pack years: 39.00    Types: Cigarettes    Last attempt to quit: 03/02/2008    Years since quitting: 8.9  . Smokeless tobacco: Former Systems developer    Quit date: 05/25/2008  Substance and Sexual Activity  . Alcohol use: Yes    Alcohol/week: 0.0 oz    Comment: rarely  . Drug use: No  . Sexual activity: Not Currently  Other Topics Concern  . Not on file  Social History Narrative  . Not on file    Current Outpatient Medications on File Prior to Visit  Medication Sig Dispense Refill  . aliskiren (TEKTURNA) 150 MG tablet Take 1 tablet (150 mg total) by mouth daily. 90 tablet 1  . cholecalciferol (VITAMIN D) 1000 UNITS tablet Take 2,000 Units by mouth daily.    . Dulaglutide (TRULICITY) 1.5 AY/3.0ZS SOPN Inject 1.5 mg into the skin once a week. 12 pen 3  . empagliflozin (JARDIANCE) 10 MG TABS tablet Take 10 mg by mouth daily. 90 tablet 3  . levothyroxine (SYNTHROID, LEVOTHROID) 100 MCG tablet TAKE 1 TABLET BY MOUTH EVERY DAY 90 tablet 0  . meclizine (ANTIVERT) 25 MG tablet TAKE 1 TABLET BY MOUTH 3 TIMES DAILY AS NEEDED FOR DIZZINESS. 30 tablet 1  .  pioglitazone-metformin (ACTOPLUS MET) 15-850 MG tablet TAKE 2 TABLETS EVERY DAY WITH SUPPER 180 tablet 1  . pravastatin (PRAVACHOL) 40 MG tablet Take 1 tablet (40 mg total) by mouth daily. 90 tablet 1  . sildenafil (VIAGRA) 100 MG tablet Take 1 tablet (100 mg total) by mouth daily as needed for erectile dysfunction. 30 tablet 5  . vitamin B-12 (CYANOCOBALAMIN) 1000 MCG tablet Take 2,000 mcg by mouth daily.     No current facility-administered medications on file prior to visit.     Allergies  Allergen Reactions  . Crab [Shellfish Allergy] Anaphylaxis  . Iodine Anaphylaxis  . Lisinopril     cough  . Crestor [Rosuvastatin]     Muscle aches  . Wilder Glade [Dapagliflozin] Other (See Comments)    Joint pain and weakness  . Iohexol Swelling  . Peanut-Containing Drug Products Itching     And whelps  . Aspirin Rash  . Codeine Rash  . Penicillins Rash    Has patient had a PCN reaction causing immediate rash, facial/tongue/throat swelling, SOB or lightheadedness with hypotension: Yes Has patient had a PCN reaction causing severe rash involving mucus membranes or skin necrosis: Yes Has patient had a PCN reaction that required hospitalization No Has patient had a PCN reaction occurring within the last 10 years: No If all of the above answers are "NO", then may proceed with Cephalosporin use.     Family History  Problem Relation Age of Onset  . Hyperlipidemia Mother   . Hypertension Mother   . Stroke Father   . Kidney disease Father   . Diabetes Father   . Cancer Maternal Grandfather   . Anesthesia problems Neg Hx   . Hypotension Neg Hx   . Malignant hyperthermia Neg Hx   . Pseudochol deficiency Neg Hx     BP (!) 162/80 (BP Location: Left Arm, Patient Position: Sitting, Cuff Size: Normal)   Pulse 76   Wt 211 lb 12.8 oz (96.1 kg)   SpO2 97%   BMI 29.54 kg/m    Review of Systems Denies decreased urinary stream.      Objective:   Physical Exam VITAL SIGNS:  See vs page GENERAL: no distress GENITALIA: Normal male scrotum, and penis.  Testicles are very small and soft.    Lab Results  Component Value Date   HGBA1C 8.2 12/27/2016      Assessment & Plan:  Type 2 DM: uncertain glycemia control.  Hypogonadism: w/u needed.  HTN: recheck next time.  Patient Instructions  check your blood sugar twice a day.  vary the time of day when you check, between before the 3 meals, and at bedtime.  also check if you have symptoms of your blood sugar being too high or too low.  please keep a record of the readings and bring it to your next appointment here (or you can bring the meter itself).  You can write it on any piece of paper.  please call us sooner if your blood sugar goes below 70, or if you have a lot of readings over 200.   I have sent a prescription to your  pharmacy, to resume the androgel.   blood tests are requested for you today.  We'll let you know about the results.  Please come back for a follow-up appointment in 2 months.

## 2017-02-05 LAB — FRUCTOSAMINE: Fructosamine: 248 umol/L (ref 190–270)

## 2017-02-05 NOTE — Telephone Encounter (Signed)
Cover my meds called and stated that you can reach to the patient pharmacy to get that insurance information or to the patient.  Pharmacy [phone]

## 2017-02-07 ENCOUNTER — Telehealth: Payer: Self-pay | Admitting: Endocrinology

## 2017-02-07 ENCOUNTER — Telehealth: Payer: Self-pay

## 2017-02-07 NOTE — Telephone Encounter (Signed)
-----   Message from Romero BellingSean Ellison, MD sent at 02/05/2017  6:34 PM EST ----- please call patient: This is like an a1c of approx 6.3%--good. Please continue the same medications. I'll see you next time.

## 2017-02-07 NOTE — Telephone Encounter (Signed)
Called patient. No answer. Left vmail per DPR.  

## 2017-02-07 NOTE — Telephone Encounter (Signed)
Re: Androgel. Patient's insurance will not cover Androgel. Pharmacy said the insurance company would cover a substitute that comes in a packet or pouch. Please advise patient at ph# [phone] Pharmacy is [facility] on [address]

## 2017-02-08 MED ORDER — TESTOSTERONE 25 MG/2.5GM (1%) TD GEL: 25.0000 mg | Freq: Every day | TRANSDERMAL | 3 refills | Status: DC

## 2017-02-08 NOTE — Telephone Encounter (Signed)
I have sent a prescription to your pharmacy  

## 2017-02-09 ENCOUNTER — Other Ambulatory Visit: Payer: Self-pay

## 2017-02-13 ENCOUNTER — Telehealth: Payer: Self-pay | Admitting: Internal Medicine

## 2017-02-13 NOTE — Telephone Encounter (Signed)
Copied from CRM [phone]. Topic: Quick Communication - See Telephone Encounter >> Feb 13, 2017  4:23 PM Arlyss Gandyichardson, Zoe Nordin N, NT wrote: CRM for notification. See Telephone encounter for: Pt calling and states that his insurance company will not allow him to have refills of his aliskiren (TEKTURNA anymore. Pt would like to speak with Dr. Yetta BarreJones about getting this medication changed.  02/13/17.

## 2017-02-14 NOTE — Telephone Encounter (Signed)
Pt is requesting aliskiren to be changed due to insurance formulary.

## 2017-02-14 NOTE — Telephone Encounter (Signed)
Patient called to follow up on this.

## 2017-02-15 ENCOUNTER — Other Ambulatory Visit: Payer: Self-pay | Admitting: Internal Medicine

## 2017-02-15 DIAGNOSIS — E118 Type 2 diabetes mellitus with unspecified complications: Secondary | ICD-10-CM

## 2017-02-15 DIAGNOSIS — Z794 Long term (current) use of insulin: Secondary | ICD-10-CM

## 2017-02-15 DIAGNOSIS — I10 Essential (primary) hypertension: Secondary | ICD-10-CM

## 2017-02-15 MED ORDER — TELMISARTAN 40 MG PO TABS: 40.0000 mg | ORAL_TABLET | Freq: Every day | ORAL | 1 refills | Status: DC

## 2017-02-15 NOTE — Telephone Encounter (Signed)
Left detailed message for pt that PCP sent in an alternative.  If patient calls back you can let him know that PCP sent in telmisartan to [facility].

## 2017-02-15 NOTE — Telephone Encounter (Signed)
changed

## 2017-02-19 LAB — CHROMOSOME ANALYSIS, PERIPHERAL BLOOD
BAND LEVEL: 550
CELLS, KARYOTYPE: 5
GTG banded metaphases: 20

## 2017-03-19 ENCOUNTER — Other Ambulatory Visit: Payer: Self-pay | Admitting: Internal Medicine

## 2017-03-23 ENCOUNTER — Other Ambulatory Visit: Payer: Self-pay

## 2017-03-27 ENCOUNTER — Ambulatory Visit: Payer: BLUE CROSS/BLUE SHIELD | Admitting: Internal Medicine

## 2017-03-27 ENCOUNTER — Ambulatory Visit: Payer: BLUE CROSS/BLUE SHIELD | Admitting: Endocrinology

## 2017-04-02 ENCOUNTER — Encounter: Payer: Self-pay | Admitting: Internal Medicine

## 2017-04-13 ENCOUNTER — Ambulatory Visit: Payer: BLUE CROSS/BLUE SHIELD | Admitting: Endocrinology

## 2017-04-13 ENCOUNTER — Encounter: Payer: Self-pay | Admitting: Endocrinology

## 2017-04-13 VITALS — BP 142/76 | HR 73 | Wt 216.4 lb

## 2017-04-13 DIAGNOSIS — E118 Type 2 diabetes mellitus with unspecified complications: Secondary | ICD-10-CM

## 2017-04-13 DIAGNOSIS — E291 Testicular hypofunction: Secondary | ICD-10-CM

## 2017-04-13 DIAGNOSIS — Z794 Long term (current) use of insulin: Secondary | ICD-10-CM | POA: Diagnosis not present

## 2017-04-13 LAB — POCT GLYCOSYLATED HEMOGLOBIN (HGB A1C): Hemoglobin A1C: 6.4

## 2017-04-13 NOTE — Patient Instructions (Addendum)
blood tests are requested for you today.  We'll let you know about the results.  Please continue the same medications. check your blood sugar once a day.  vary the time of day when you check, between before the 3 meals, and at bedtime.  also check if you have symptoms of your blood sugar being too high or too low.  please keep a record of the readings and bring it to your next appointment here (or you can bring the meter itself).  You can write it on any piece of paper.  please call us sooner if your blood sugar goes below 70, or if you have a lot of readings over 200. Please come back for a follow-up appointment in 3-4 months.

## 2017-04-13 NOTE — Progress Notes (Signed)
Subjective:    Patient ID: Terry Harrington, male    DOB: August 14, 1955, 62 y.o.   MRN: [id number]  HPI Pt returns for f/u of diabetes mellitus: DM type: 2 Dx'ed: 4944 Complications: none.   Therapy: trulicity and 3 oral meds.  DKA: never.  Severe hypoglycemia: never.  Pancreatitis: never Other: he took insulin 2015-2017; in early 2017, he was hospitalized for nonketotic hyperosmolar hyperglycemic state, due to missing insulin doses, he did not tolerate farxiga (arthralgias); he works M-F, day shift.  He says cbg's are higher on weekends.  Interval history: pt says cbg's are well-controlled.  pt states he feels well in general.   Pt also has primary hypogonadism (dx'ed 2018; he has lifelong infertility, but karyotype was normal; he took androgel 2012-2014).  He has ED sxs are slightly better  Past Medical History:  Diagnosis Date  . Diabetes mellitus   . GERD (gastroesophageal reflux disease)   . Heart murmur   . Hyperlipidemia   . Hypertension    on meds  . Hypothyroidism    on meds    Past Surgical History:  Procedure Laterality Date  . DENTAL SURGERY     several dental procedure approx 10 between 1999-2012  . HERNIA REPAIR     as a child  . SHOULDER ARTHROSCOPY  05/31/2011   Procedure: ARTHROSCOPY SHOULDER;  Surgeon: Sharmon Revere, MD;  Location: [facility];  Service: Orthopedics;  Laterality: Left;  Shoulder Acromioplasty Left  . SHOULDER SURGERY  05/31/2011    impingment left    Social History   Socioeconomic History  . Marital status: Divorced    Spouse name: Not on file  . Number of children: Not on file  . Years of education: Not on file  . Highest education level: Not on file  Occupational History  . Occupation: Barista  . Financial resource strain: Not on file  . Food insecurity:    Worry: Not on file    Inability: Not on file  . Transportation needs:    Medical: Not on file    Non-medical: Not on file  Tobacco Use  . Smoking status:  Former Smoker    Packs/day: 1.50    Years: 26.00    Pack years: 39.00    Types: Cigarettes    Last attempt to quit: 03/02/2008    Years since quitting: 9.1  . Smokeless tobacco: Former Systems developer    Quit date: 05/25/2008  Substance and Sexual Activity  . Alcohol use: Yes    Alcohol/week: 0.0 oz    Comment: rarely  . Drug use: No  . Sexual activity: Not Currently  Lifestyle  . Physical activity:    Days per week: Not on file    Minutes per session: Not on file  . Stress: Not on file  Relationships  . Social connections:    Talks on phone: Not on file    Gets together: Not on file    Attends religious service: Not on file    Active member of club or organization: Not on file    Attends meetings of clubs or organizations: Not on file    Relationship status: Not on file  . Intimate partner violence:    Fear of current or ex partner: Not on file    Emotionally abused: Not on file    Physically abused: Not on file    Forced sexual activity: Not on file  Other Topics Concern  . Not on file  Social  History Narrative  . Not on file    Current Outpatient Medications on File Prior to Visit  Medication Sig Dispense Refill  . cholecalciferol (VITAMIN D) 1000 UNITS tablet Take 2,000 Units by mouth daily.    . Dulaglutide (TRULICITY) 1.5 FH/5.4TG SOPN Inject 1.5 mg into the skin once a week. 12 pen 3  . empagliflozin (JARDIANCE) 10 MG TABS tablet Take 10 mg by mouth daily. 90 tablet 3  . levothyroxine (SYNTHROID, LEVOTHROID) 100 MCG tablet TAKE 1 TABLET BY MOUTH EVERY DAY 90 tablet 0  . meclizine (ANTIVERT) 25 MG tablet TAKE 1 TABLET BY MOUTH 3 TIMES DAILY AS NEEDED FOR DIZZINESS. 30 tablet 1  . pioglitazone-metformin (ACTOPLUS MET) 15-850 MG tablet TAKE 2 TABLETS EVERY DAY WITH SUPPER 180 tablet 1  . pravastatin (PRAVACHOL) 40 MG tablet Take 1 tablet (40 mg total) by mouth daily. 90 tablet 1  . sildenafil (VIAGRA) 100 MG tablet Take 1 tablet (100 mg total) by mouth daily as needed for  erectile dysfunction. 30 tablet 5  . telmisartan (MICARDIS) 40 MG tablet Take 1 tablet (40 mg total) by mouth daily. 90 tablet 1  . Testosterone 25 MG/2.5GM (1%) GEL Place 25 mg onto the skin daily. 30 Package 3  . vitamin B-12 (CYANOCOBALAMIN) 1000 MCG tablet Take 2,000 mcg by mouth daily.     No current facility-administered medications on file prior to visit.     Allergies  Allergen Reactions  . Crab [Shellfish Allergy] Anaphylaxis  . Iodine Anaphylaxis  . Lisinopril     cough  . Crestor [Rosuvastatin]     Muscle aches  . Wilder Glade [Dapagliflozin] Other (See Comments)    Joint pain and weakness  . Iohexol Swelling  . Peanut-Containing Drug Products Itching    And whelps  . Aspirin Rash  . Codeine Rash  . Penicillins Rash    Has patient had a PCN reaction causing immediate rash, facial/tongue/throat swelling, SOB or lightheadedness with hypotension: Yes Has patient had a PCN reaction causing severe rash involving mucus membranes or skin necrosis: Yes Has patient had a PCN reaction that required hospitalization No Has patient had a PCN reaction occurring within the last 10 years: No If all of the above answers are "NO", then may proceed with Cephalosporin use.     Family History  Problem Relation Age of Onset  . Hyperlipidemia Mother   . Hypertension Mother   . Stroke Father   . Kidney disease Father   . Diabetes Father   . Cancer Maternal Grandfather   . Anesthesia problems Neg Hx   . Hypotension Neg Hx   . Malignant hyperthermia Neg Hx   . Pseudochol deficiency Neg Hx     BP (!) 142/76 (BP Location: Left Arm, Patient Position: Sitting, Cuff Size: Normal)   Pulse 73   Wt 216 lb 6.4 oz (98.2 kg)   SpO2 98%   BMI 30.18 kg/m    Review of Systems He denies hypoglycemia.     Objective:   Physical Exam VITAL SIGNS:  See vs page GENERAL: no distress Pulses: dorsalis pedis intact bilat.   MSK: no deformity of the feet CV: no leg edema Skin:  no ulcer on the  feet.  normal color and temp on the feet. Neuro: sensation is intact to touch on the feet   Lab Results  Component Value Date   HGBA1C 6.4 04/13/2017      Assessment & Plan:  Type 2 DM: well-controlled.  Primary hypogonadism: due for recheck.  Patient Instructions  blood tests are requested for you today.  We'll let you know about the results.  Please continue the same medications. check your blood sugar once a day.  vary the time of day when you check, between before the 3 meals, and at bedtime.  also check if you have symptoms of your blood sugar being too high or too low.  please keep a record of the readings and bring it to your next appointment here (or you can bring the meter itself).  You can write it on any piece of paper.  please call us sooner if your blood sugar goes below 70, or if you have a lot of readings over 200. Please come back for a follow-up appointment in 3-4 months.

## 2017-04-14 LAB — PROLACTIN: PROLACTIN: 9 ng/mL (ref 2.0–18.0)

## 2017-04-14 LAB — TESTOSTERONE,FREE AND TOTAL
Testosterone, Free: 4.9 pg/mL — ABNORMAL LOW (ref 6.6–18.1)
Testosterone: 129 ng/dL — ABNORMAL LOW (ref 264–916)

## 2017-04-16 ENCOUNTER — Ambulatory Visit: Payer: BLUE CROSS/BLUE SHIELD | Admitting: Internal Medicine

## 2017-04-16 ENCOUNTER — Encounter: Payer: Self-pay | Admitting: Internal Medicine

## 2017-04-16 ENCOUNTER — Other Ambulatory Visit (INDEPENDENT_AMBULATORY_CARE_PROVIDER_SITE_OTHER): Payer: BLUE CROSS/BLUE SHIELD

## 2017-04-16 VITALS — BP 120/72 | HR 70 | Temp 98.8°F | Wt 215.0 lb

## 2017-04-16 DIAGNOSIS — I10 Essential (primary) hypertension: Secondary | ICD-10-CM | POA: Diagnosis not present

## 2017-04-16 DIAGNOSIS — E039 Hypothyroidism, unspecified: Secondary | ICD-10-CM

## 2017-04-16 DIAGNOSIS — E118 Type 2 diabetes mellitus with unspecified complications: Secondary | ICD-10-CM

## 2017-04-16 LAB — URINALYSIS, ROUTINE W REFLEX MICROSCOPIC
Bilirubin Urine: NEGATIVE
HGB URINE DIPSTICK: NEGATIVE
Ketones, ur: NEGATIVE
Leukocytes, UA: NEGATIVE
NITRITE: NEGATIVE
RBC / HPF: NONE SEEN (ref 0–?)
SPECIFIC GRAVITY, URINE: 1.02 (ref 1.000–1.030)
Total Protein, Urine: NEGATIVE
Urobilinogen, UA: 0.2 (ref 0.0–1.0)
pH: 6 (ref 5.0–8.0)

## 2017-04-16 LAB — MICROALBUMIN / CREATININE URINE RATIO
CREATININE, U: 121 mg/dL
MICROALB/CREAT RATIO: 0.6 mg/g (ref 0.0–30.0)

## 2017-04-16 LAB — COMPREHENSIVE METABOLIC PANEL
ALT: 11 U/L (ref 0–53)
AST: 12 U/L (ref 0–37)
Albumin: 4.3 g/dL (ref 3.5–5.2)
Alkaline Phosphatase: 82 U/L (ref 39–117)
BILIRUBIN TOTAL: 0.4 mg/dL (ref 0.2–1.2)
BUN: 11 mg/dL (ref 6–23)
CALCIUM: 9.6 mg/dL (ref 8.4–10.5)
CHLORIDE: 102 meq/L (ref 96–112)
CO2: 28 meq/L (ref 19–32)
Creatinine, Ser: 0.84 mg/dL (ref 0.40–1.50)
GFR: 118.5 mL/min (ref >60.00)
GLUCOSE: 97 mg/dL (ref 70–99)
POTASSIUM: 4.2 meq/L (ref 3.5–5.1)
Sodium: 136 mEq/L (ref 135–145)
Total Protein: 7.6 g/dL (ref 6.0–8.3)

## 2017-04-16 LAB — MAGNESIUM: MAGNESIUM: 1.9 mg/dL (ref 1.5–2.5)

## 2017-04-16 LAB — TSH: TSH: 2.22 u[IU]/mL (ref 0.35–4.50)

## 2017-04-16 LAB — LUTEINIZING HORMONE: LH: 14.06 m[IU]/mL — ABNORMAL HIGH (ref 1.50–9.30)

## 2017-04-16 NOTE — Progress Notes (Signed)
Subjective:  Patient ID: Terry Harrington, male    DOB: [birth year]  Age: 62 y.o. MRN: [id number]  CC: Hypertension and Diabetes   HPI Terry Harrington presents for follow up - He saw his endocrinologist a few days ago and his blood sugars were well controlled with an A1c of 6.4%.  He also tells me his blood pressure has been well controlled.  He denies any recent episodes of headache/blurred vision/chest pain/shortness of breath/palpitations/edema/fatigue.  Outpatient Medications Prior to Visit  Medication Sig Dispense Refill  . cholecalciferol (VITAMIN D) 1000 UNITS tablet Take 2,000 Units by mouth daily.    . Dulaglutide (TRULICITY) 1.5 GE/3.6OQ SOPN Inject 1.5 mg into the skin once a week. 12 pen 3  . empagliflozin (JARDIANCE) 10 MG TABS tablet Take 10 mg by mouth daily. 90 tablet 3  . levothyroxine (SYNTHROID, LEVOTHROID) 100 MCG tablet TAKE 1 TABLET BY MOUTH EVERY DAY 90 tablet 0  . meclizine (ANTIVERT) 25 MG tablet TAKE 1 TABLET BY MOUTH 3 TIMES DAILY AS NEEDED FOR DIZZINESS. 30 tablet 1  . pioglitazone-metformin (ACTOPLUS MET) 15-850 MG tablet TAKE 2 TABLETS EVERY DAY WITH SUPPER 180 tablet 1  . pravastatin (PRAVACHOL) 40 MG tablet Take 1 tablet (40 mg total) by mouth daily. 90 tablet 1  . sildenafil (VIAGRA) 100 MG tablet Take 1 tablet (100 mg total) by mouth daily as needed for erectile dysfunction. 30 tablet 5  . telmisartan (MICARDIS) 40 MG tablet Take 1 tablet (40 mg total) by mouth daily. 90 tablet 1  . vitamin B-12 (CYANOCOBALAMIN) 1000 MCG tablet Take 2,000 mcg by mouth daily.    . Testosterone 25 MG/2.5GM (1%) GEL Place 25 mg onto the skin daily. 30 Package 3   No facility-administered medications prior to visit.     ROS Review of Systems  Constitutional: Negative.  Negative for appetite change, diaphoresis, fatigue and unexpected weight change.  HENT: Negative.  Negative for sinus pressure and trouble swallowing.   Eyes: Negative for visual disturbance.    Respiratory: Negative for cough, chest tightness, shortness of breath and wheezing.   Cardiovascular: Negative for chest pain, palpitations and leg swelling.  Gastrointestinal: Negative for abdominal pain, constipation, diarrhea, nausea and vomiting.  Endocrine: Negative.  Negative for cold intolerance and heat intolerance.  Genitourinary: Negative.  Negative for difficulty urinating.  Musculoskeletal: Negative.  Negative for arthralgias, back pain and neck pain.  Skin: Negative.  Negative for color change.  Neurological: Negative.  Negative for dizziness, weakness and light-headedness.  Hematological: Negative for adenopathy. Does not bruise/bleed easily.  Psychiatric/Behavioral: Negative.     Objective:  BP 120/72   Pulse 70   Temp 98.8 F (37.1 C)   Wt 215 lb (97.5 kg)   SpO2 98%   BMI 29.99 kg/m   BP Readings from Last 3 Encounters:  04/16/17 120/72  04/13/17 (!) 142/76  02/02/17 (!) 162/80    Wt Readings from Last 3 Encounters:  04/16/17 215 lb (97.5 kg)  04/13/17 216 lb 6.4 oz (98.2 kg)  02/02/17 211 lb 12.8 oz (96.1 kg)    Physical Exam  Constitutional: He is oriented to person, place, and time. He appears well-developed and well-nourished.  Non-toxic appearance. He does not have a sickly appearance. He does not appear ill. No distress.  HENT:  Mouth/Throat: No oropharyngeal exudate.  Neck: Normal range of motion. Neck supple. No thyromegaly present.  Cardiovascular: Normal rate, regular rhythm and normal heart sounds. Exam reveals no gallop and no friction rub.  No murmur heard. Pulmonary/Chest: Effort normal and breath sounds normal. No stridor. No respiratory distress. He has no wheezes. He has no rales.  Abdominal: Soft. Bowel sounds are normal. He exhibits no distension and no mass. There is no tenderness. There is no rebound.  Musculoskeletal: Normal range of motion. He exhibits no edema or deformity.  Lymphadenopathy:    He has no cervical adenopathy.   Neurological: He is alert and oriented to person, place, and time.  Skin: Skin is warm and dry.    Lab Results  Component Value Date   WBC 6.2 01/10/2017   HGB 15.9 01/10/2017   HCT 45.2 01/10/2017   PLT 305 01/10/2017   GLUCOSE 97 04/16/2017   CHOL 157 08/21/2016   TRIG 121.0 08/21/2016   HDL 56.70 08/21/2016   LDLDIRECT 132.8 02/21/2013   LDLCALC 77 08/21/2016   ALT 11 04/16/2017   AST 12 04/16/2017   NA 136 04/16/2017   K 4.2 04/16/2017   CL 102 04/16/2017   CREATININE 0.84 04/16/2017   BUN 11 04/16/2017   CO2 28 04/16/2017   TSH 2.22 04/16/2017   PSA 0.24 12/27/2016   HGBA1C 6.4 04/13/2017   MICROALBUR <0.7 04/16/2017    No results found.  Assessment & Plan:   Terry Harrington was seen today for hypertension and diabetes.  Diagnoses and all orders for this visit:  Essential hypertension- His blood pressure is well controlled.  Electrolytes and renal function are normal. -     Comprehensive metabolic panel; Future -     Magnesium; Future -     Urinalysis, Routine w reflex microscopic; Future  Acquired hypothyroidism- His TSH is in the normal range.  He will remain on the current dose of levothyroxine. -     TSH; Future  Type 2 diabetes mellitus with complication, without long-term current use of insulin (Terry Harrington)- His recent A1c was 6.4%.  His blood sugars are adequately well controlled. -     Cancel: Hemoglobin A1c; Future -     Microalbumin / creatinine urine ratio; Future   I am having Terry Harrington maintain his vitamin B-12, cholecalciferol, pravastatin, pioglitazone-metformin, empagliflozin, Dulaglutide, sildenafil, meclizine, telmisartan, and levothyroxine.  No orders of the defined types were placed in this encounter.    Follow-up: Return in about 6 months (around 10/16/2017).  Terry Calico, MD

## 2017-04-16 NOTE — Patient Instructions (Signed)

## 2017-04-17 ENCOUNTER — Other Ambulatory Visit: Payer: Self-pay | Admitting: Endocrinology

## 2017-04-17 ENCOUNTER — Encounter: Payer: Self-pay | Admitting: Internal Medicine

## 2017-04-17 MED ORDER — TESTOSTERONE 25 MG/2.5GM (1%) TD GEL: 50.0000 mg | Freq: Every day | TRANSDERMAL | 5 refills | Status: DC

## 2017-04-27 ENCOUNTER — Ambulatory Visit: Payer: BLUE CROSS/BLUE SHIELD | Admitting: Endocrinology

## 2017-06-03 ENCOUNTER — Other Ambulatory Visit: Payer: Self-pay | Admitting: Internal Medicine

## 2017-06-11 ENCOUNTER — Other Ambulatory Visit: Payer: Self-pay | Admitting: Internal Medicine

## 2017-06-24 ENCOUNTER — Other Ambulatory Visit: Payer: Self-pay | Admitting: Internal Medicine

## 2017-08-02 ENCOUNTER — Encounter: Payer: Self-pay | Admitting: Endocrinology

## 2017-08-09 ENCOUNTER — Other Ambulatory Visit: Payer: Self-pay | Admitting: Internal Medicine

## 2017-08-09 DIAGNOSIS — Z794 Long term (current) use of insulin: Secondary | ICD-10-CM

## 2017-08-09 DIAGNOSIS — E118 Type 2 diabetes mellitus with unspecified complications: Secondary | ICD-10-CM

## 2017-08-09 DIAGNOSIS — I10 Essential (primary) hypertension: Secondary | ICD-10-CM

## 2017-08-10 DIAGNOSIS — E119 Type 2 diabetes mellitus without complications: Secondary | ICD-10-CM | POA: Diagnosis not present

## 2017-08-10 DIAGNOSIS — H5203 Hypermetropia, bilateral: Secondary | ICD-10-CM | POA: Diagnosis not present

## 2017-08-10 DIAGNOSIS — H25813 Combined forms of age-related cataract, bilateral: Secondary | ICD-10-CM | POA: Diagnosis not present

## 2017-08-10 LAB — HM DIABETES EYE EXAM

## 2017-08-27 ENCOUNTER — Encounter: Payer: Self-pay | Admitting: Internal Medicine

## 2017-08-31 ENCOUNTER — Encounter: Payer: Self-pay | Admitting: Endocrinology

## 2017-08-31 ENCOUNTER — Ambulatory Visit: Payer: BLUE CROSS/BLUE SHIELD | Admitting: Endocrinology

## 2017-08-31 VITALS — BP 152/72 | HR 96 | Ht 71.0 in | Wt 214.2 lb

## 2017-08-31 DIAGNOSIS — E291 Testicular hypofunction: Secondary | ICD-10-CM | POA: Diagnosis not present

## 2017-08-31 DIAGNOSIS — E118 Type 2 diabetes mellitus with unspecified complications: Secondary | ICD-10-CM | POA: Diagnosis not present

## 2017-08-31 LAB — POCT GLYCOSYLATED HEMOGLOBIN (HGB A1C): HEMOGLOBIN A1C: 6.8 % — AB (ref 4.0–5.6)

## 2017-08-31 MED ORDER — TESTOSTERONE 50 MG/5GM (1%) TD GEL: 5.0000 g | Freq: Every day | TRANSDERMAL | 5 refills | Status: DC

## 2017-08-31 MED ORDER — TESTOSTERONE 25 MG/2.5GM (1%) TD GEL: 50.0000 mg | Freq: Every day | TRANSDERMAL | 5 refills | Status: DC

## 2017-08-31 NOTE — Progress Notes (Signed)
 Subjective:    Patient ID: Terry Harrington, male    DOB: 12/24/1955, 62 y.o.   MRN: [id number]  HPI Pt returns for f/u of diabetes mellitus: DM type: 2 Dx'ed: 2010 Complications: none.   Therapy: trulicity and 3 oral meds.  DKA: never.  Severe hypoglycemia: never.  Pancreatitis: never Other: he took insulin 2015-2017; in early 2017, he was hospitalized for nonketotic hyperosmolar hyperglycemic state, due to missing insulin doses, he did not tolerate farxiga (arthralgias); he works M-F, day shift.  He says cbg's are higher on weekends.  Interval history: pt says cbg's are well-controlled.  pt states he feels well in general.   Pt also has primary hypogonadism (dx'ed 2018; he has lifelong infertility, but karyotype was normal; he took androgel 2012-2014).  He has ED sxs are slightly better.  He says pharmacy has been giving him just 30 packs per month.   Past Medical History:  Diagnosis Date  . Diabetes mellitus   . GERD (gastroesophageal reflux disease)   . Heart murmur   . Hyperlipidemia   . Hypertension    on meds  . Hypothyroidism    on meds    Past Surgical History:  Procedure Laterality Date  . DENTAL SURGERY     several dental procedure approx 10 between 1999-2012  . HERNIA REPAIR     as a child  . SHOULDER ARTHROSCOPY  05/31/2011   Procedure: ARTHROSCOPY SHOULDER;  Surgeon: [name] F Carter, MD;  Location: [facility];  Service: Orthopedics;  Laterality: Left;  Shoulder Acromioplasty Left  . SHOULDER SURGERY  05/31/2011    impingment left    Social History   Socioeconomic History  . Marital status: Divorced    Spouse name: Not on file  . Number of children: Not on file  . Years of education: Not on file  . Highest education level: Not on file  Occupational History  . Occupation: Construction  Social Needs  . Financial resource strain: Not on file  . Food insecurity:    Worry: Not on file    Inability: Not on file  . Transportation needs:    Medical: Not on  file    Non-medical: Not on file  Tobacco Use  . Smoking status: Former Smoker    Packs/day: 1.50    Years: 26.00    Pack years: 39.00    Types: Cigarettes    Last attempt to quit: 03/02/2008    Years since quitting: 9.5  . Smokeless tobacco: Former User    Quit date: 05/25/2008  Substance and Sexual Activity  . Alcohol use: Yes    Alcohol/week: 0.0 standard drinks    Comment: rarely  . Drug use: No  . Sexual activity: Not Currently  Lifestyle  . Physical activity:    Days per week: Not on file    Minutes per session: Not on file  . Stress: Not on file  Relationships  . Social connections:    Talks on phone: Not on file    Gets together: Not on file    Attends religious service: Not on file    Active member of club or organization: Not on file    Attends meetings of clubs or organizations: Not on file    Relationship status: Not on file  . Intimate partner violence:    Fear of current or ex partner: Not on file    Emotionally abused: Not on file    Physically abused: Not on file    Forced sexual   activity: Not on file  Other Topics Concern  . Not on file  Social History Narrative  . Not on file    Current Outpatient Medications on File Prior to Visit  Medication Sig Dispense Refill  . cholecalciferol (VITAMIN D) 1000 UNITS tablet Take 2,000 Units by mouth daily.    . Dulaglutide (TRULICITY) 1.5 MG/0.5ML SOPN Inject 1.5 mg into the skin once a week. 12 pen 3  . empagliflozin (JARDIANCE) 10 MG TABS tablet Take 10 mg by mouth daily. 90 tablet 3  . levothyroxine (SYNTHROID, LEVOTHROID) 100 MCG tablet TAKE 1 TABLET BY MOUTH EVERY DAY 90 tablet 0  . meclizine (ANTIVERT) 25 MG tablet TAKE 1 TABLET BY MOUTH 3 TIMES DAILY AS NEEDED FOR DIZZINESS. 30 tablet 1  . pioglitazone-metformin (ACTOPLUS MET) 15-850 MG tablet TAKE 2 TABLETS EVERY DAY WITH SUPPER 180 tablet 1  . pravastatin (PRAVACHOL) 40 MG tablet TAKE 1 TABLET BY MOUTH EVERY DAY 90 tablet 1  . sildenafil (VIAGRA) 100 MG  tablet Take 1 tablet (100 mg total) by mouth daily as needed for erectile dysfunction. 30 tablet 5  . telmisartan (MICARDIS) 40 MG tablet TAKE 1 TABLET BY MOUTH EVERY DAY 90 tablet 1  . vitamin B-12 (CYANOCOBALAMIN) 1000 MCG tablet Take 2,000 mcg by mouth daily.     No current facility-administered medications on file prior to visit.     Allergies  Allergen Reactions  . Crab [Shellfish Allergy] Anaphylaxis  . Iodine Anaphylaxis  . Lisinopril     cough  . Crestor [Rosuvastatin]     Muscle aches  . Farxiga [Dapagliflozin] Other (See Comments)    Joint pain and weakness  . Iohexol Swelling  . Peanut-Containing Drug Products Itching    And whelps  . Aspirin Rash  . Codeine Rash  . Penicillins Rash    Has patient had a PCN reaction causing immediate rash, facial/tongue/throat swelling, SOB or lightheadedness with hypotension: Yes Has patient had a PCN reaction causing severe rash involving mucus membranes or skin necrosis: Yes Has patient had a PCN reaction that required hospitalization No Has patient had a PCN reaction occurring within the last 10 years: No If all of the above answers are "NO", then may proceed with Cephalosporin use.     Family History  Problem Relation Age of Onset  . Hyperlipidemia Mother   . Hypertension Mother   . Stroke Father   . Kidney disease Father   . Diabetes Father   . Cancer Maternal Grandfather   . Anesthesia problems Neg Hx   . Hypotension Neg Hx   . Malignant hyperthermia Neg Hx   . Pseudochol deficiency Neg Hx     BP (!) 152/72 (BP Location: Left Arm, Patient Position: Sitting, Cuff Size: Normal)   Pulse 96   Ht 5' 11" (1.803 m)   Wt 214 lb 3.2 oz (97.2 kg)   SpO2 96%   BMI 29.87 kg/m    Review of Systems He denies hypoglycemia.  Denies decreased urinary stream.      Objective:   Physical Exam VITAL SIGNS:  See vs page GENERAL: no distress Pulses: foot pulses are intact bilaterally.   MSK: no deformity of the feet or ankles.   CV: trace bilat edema of the legs or ankles.   Skin:  no ulcer on the feet or ankles.  normal color and temp on the feet and ankles Neuro: sensation is intact to touch on the feet and ankles.   Ext: There is bilateral onychomycosis of   the toenails.   A1c=6.8%    Assessment & Plan:  Type 2 DM: well-controlled.  Hypogonadism: recheck today.    Patient Instructions  blood tests are requested for you today.  We'll let you know about the results.  Please continue the same medications. check your blood sugar once a day.  vary the time of day when you check, between before the 3 meals, and at bedtime.  also check if you have symptoms of your blood sugar being too high or too low.  please keep a record of the readings and bring it to your next appointment here (or you can bring the meter itself).  You can write it on any piece of paper.  please call us sooner if your blood sugar goes below 70, or if you have a lot of readings over 200. Please come back for a follow-up appointment in 4-6 months.         

## 2017-08-31 NOTE — Patient Instructions (Signed)
blood tests are requested for you today.  We'll let you know about the results.  Please continue the same medications. check your blood sugar once a day.  vary the time of day when you check, between before the 3 meals, and at bedtime.  also check if you have symptoms of your blood sugar being too high or too low.  please keep a record of the readings and bring it to your next appointment here (or you can bring the meter itself).  You can write it on any piece of paper.  please call us sooner if your blood sugar goes below 70, or if you have a lot of readings over 200. Please come back for a follow-up appointment in 4-6 months.

## 2017-09-01 IMAGING — MR MR HEAD W/O CM
8 of 10 series · 39 of 48 positions shown · non-contrast
Comparison: Head CT without contrast 3070 hours today.

CLINICAL DATA: 59-year-old male with acute onset vertigo since last
night. Initial encounter.

EXAM:
MRI HEAD WITHOUT CONTRAST
TECHNIQUE: Multiplanar, multiecho pulse sequences of the brain and surrounding
structures were obtained without intravenous contrast.

[Series 3: T1 · sagittal · 5.0mm · 0.47mm/px · 2 of 24 slices shown]
[im 1/24]
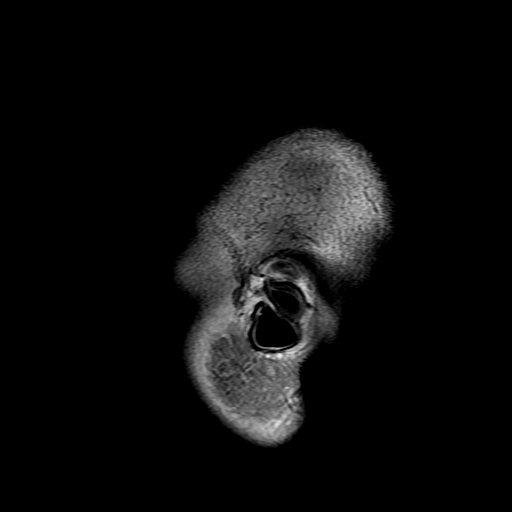
[im 24/24]
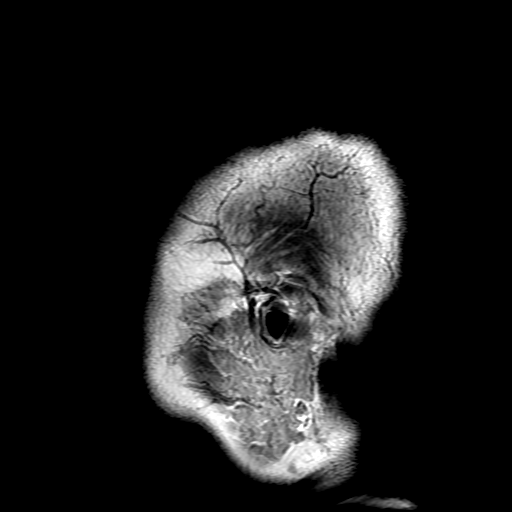

[Series 4: DWI · axial · 3.0mm · 1.09mm/px · z∈[-52,+100]mm · 11 of 104 slices shown (1 of 4)]
[im 1/104]
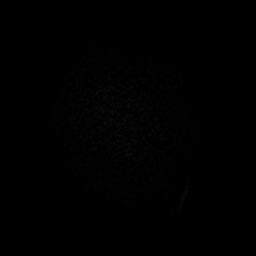
[im 11/104]
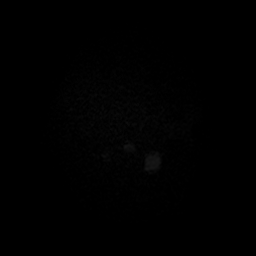
[im 21/104]
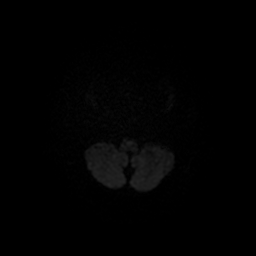
[im 31/104]
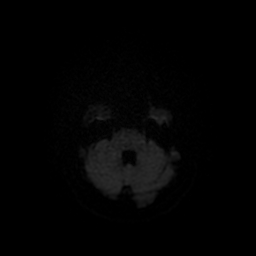
[im 42/104]
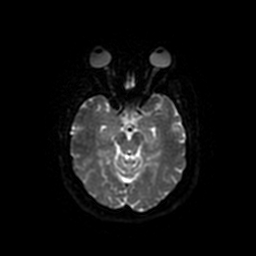
[im 52/104]
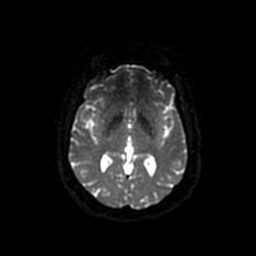
[im 62/104]
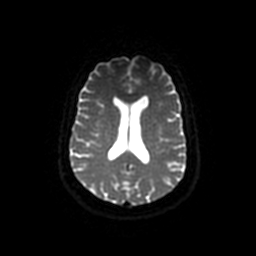
[im 73/104]
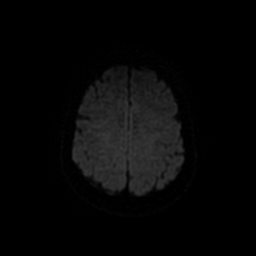
[im 83/104]
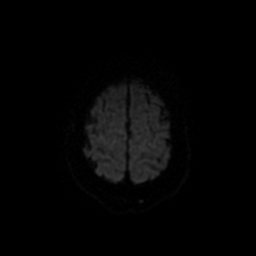
[im 93/104]
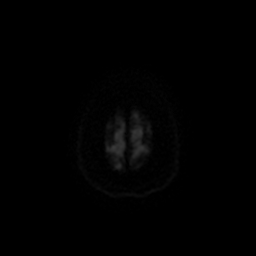
[im 104/104]
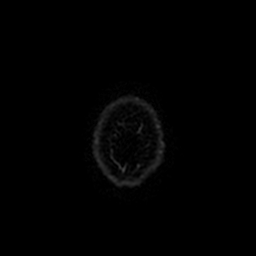

[Series 5: DWI · coronal · 5.0mm · 1.09mm/px · 7 of 68 slices shown (2 of 4)]
[im 1/68]
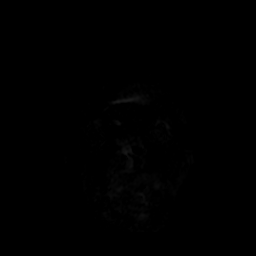
[im 12/68]
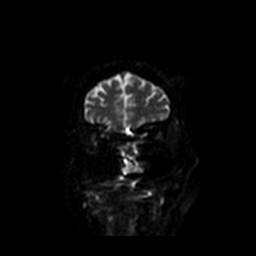
[im 23/68]
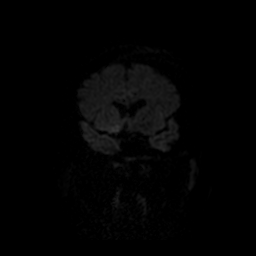
[im 34/68]
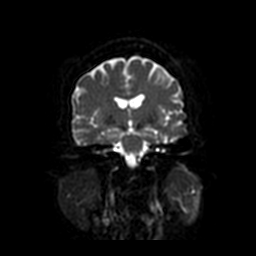
[im 45/68]
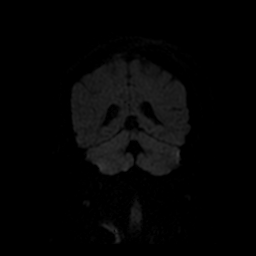
[im 56/68]
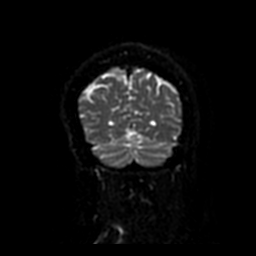
[im 68/68]
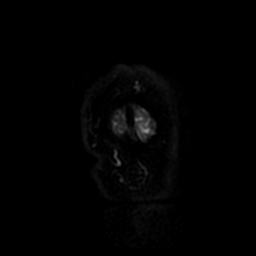

[Series 6: T2 · axial · 5.0mm · 0.43mm/px · z∈[-65,+94]mm · 3 of 26 slices shown (1 of 2)]
[im 1/26]
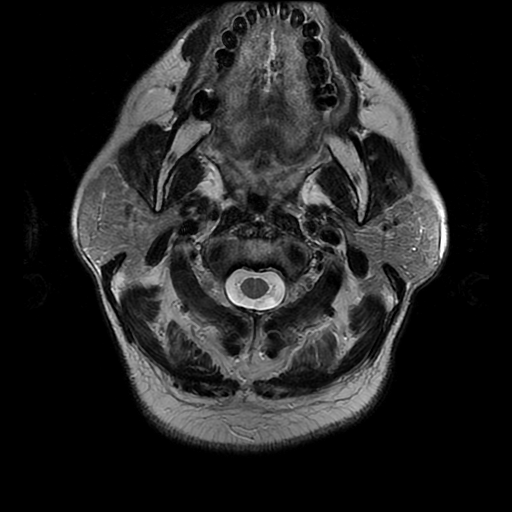
[im 13/26]
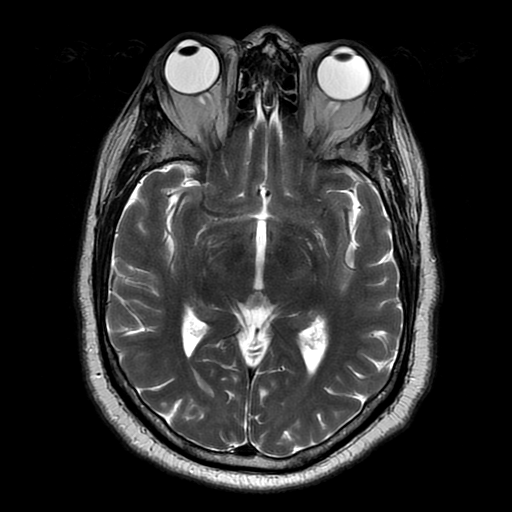
[im 26/26]
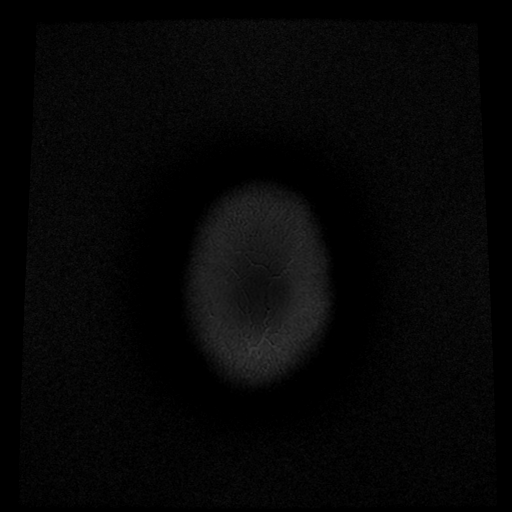

[Series 7: FLAIR · axial · 5.0mm · 0.43mm/px · z∈[-65,+94]mm · 3 of 26 slices shown]
[im 1/26]
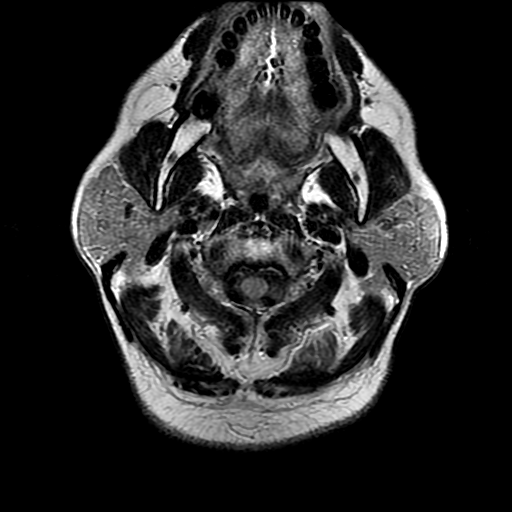
[im 13/26]
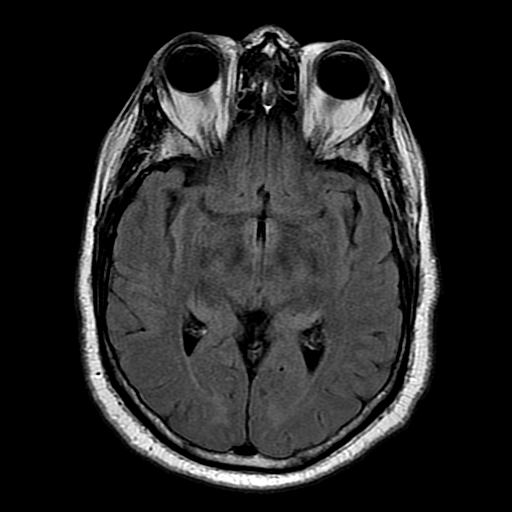
[im 26/26]
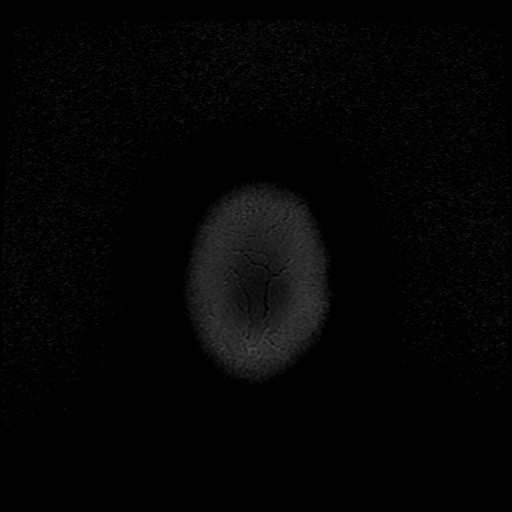

[Series 10: T2 · coronal · 5.0mm · 0.45mm/px · 3 of 28 slices shown (2 of 2)]
[im 1/28]
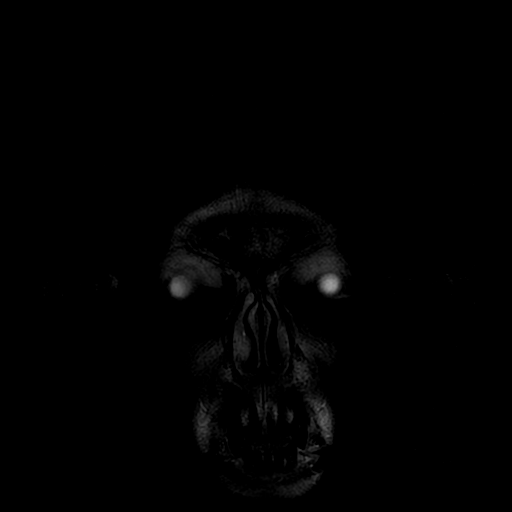
[im 14/28]
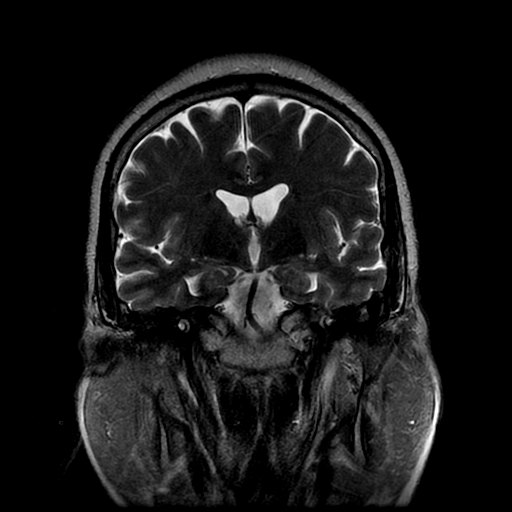
[im 28/28]
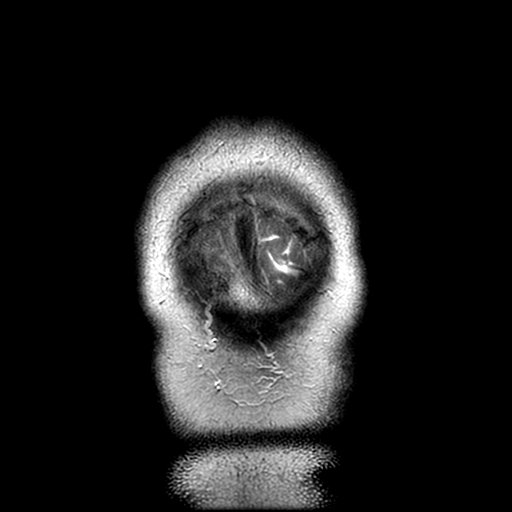

[Series 400: DWI · axial · 3.0mm · 1.09mm/px · z∈[-52,+100]mm · 6 of 52 slices shown (3 of 4)]
[im 1/52]
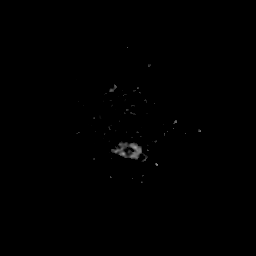
[im 11/52]
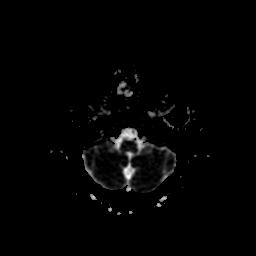
[im 21/52]
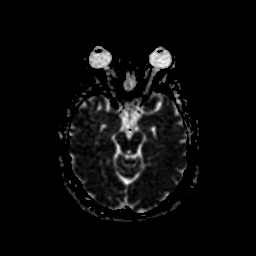
[im 31/52]
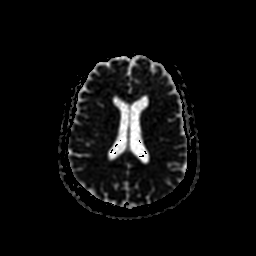
[im 41/52]
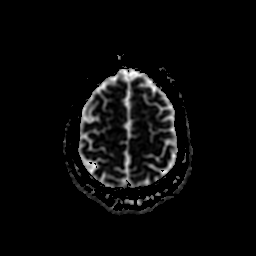
[im 52/52]
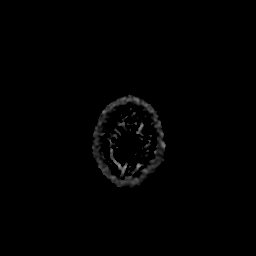

[Series 500: DWI · coronal · 5.0mm · 1.09mm/px · 4 of 34 slices shown (4 of 4)]
[im 1/34]
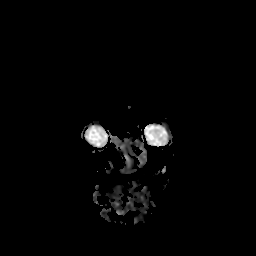
[im 12/34]
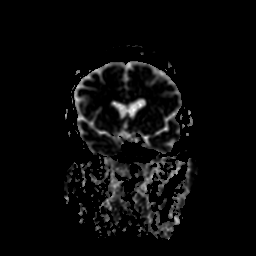
[im 23/34]
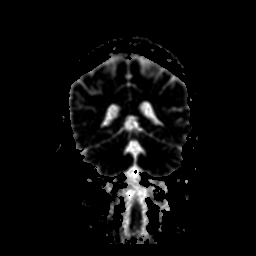
[im 34/34]
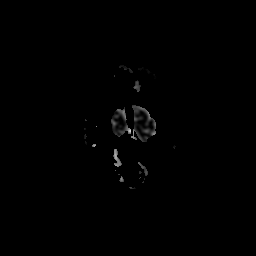

[39 of 48 positions shown; findings below may reference images not displayed]

FINDINGS: Cerebral volume is within normal limits for age. No restricted
diffusion to suggest acute infarction. No midline shift, mass
effect, evidence of mass lesion, ventriculomegaly, extra-axial
collection or acute intracranial hemorrhage. Cervicomedullary
junction and pituitary are within normal limits. Negative visualized
cervical spine. Major intracranial vascular flow voids are within
normal limits.

Gray and white matter signal is within normal limits throughout the
brain. No encephalomalacia or chronic cerebral blood products.
Visible internal auditory structures appear normal. Mastoids are
clear.

Paranasal sinuses and mastoids are clear. Negative orbit and scalp
soft tissues. Normal bone marrow signal.
IMPRESSION: Normal noncontrast MRI appearance of the brain.

## 2017-09-02 LAB — TESTOSTERONE,FREE AND TOTAL
TESTOSTERONE FREE: 2.7 pg/mL — AB (ref 6.6–18.1)
TESTOSTERONE: 78 ng/dL — AB (ref 264–916)

## 2017-09-04 ENCOUNTER — Other Ambulatory Visit: Payer: Self-pay | Admitting: Endocrinology

## 2017-09-04 DIAGNOSIS — E291 Testicular hypofunction: Secondary | ICD-10-CM

## 2017-09-04 MED ORDER — TESTOSTERONE 50 MG/5GM (1%) TD GEL: 5.0000 g | Freq: Every day | TRANSDERMAL | 5 refills | Status: DC

## 2017-09-04 NOTE — Telephone Encounter (Signed)
Please advise on change?

## 2017-09-04 NOTE — Telephone Encounter (Signed)
OK, I changed and resent

## 2017-09-05 ENCOUNTER — Encounter: Payer: Self-pay | Admitting: Endocrinology

## 2017-09-05 ENCOUNTER — Other Ambulatory Visit: Payer: Self-pay | Admitting: Emergency Medicine

## 2017-09-05 NOTE — Telephone Encounter (Signed)
Please Advise of refill.

## 2017-09-05 NOTE — Telephone Encounter (Signed)
Refill was sent yesterday.

## 2017-09-06 ENCOUNTER — Other Ambulatory Visit: Payer: Self-pay | Admitting: Internal Medicine

## 2017-10-17 ENCOUNTER — Ambulatory Visit: Payer: BLUE CROSS/BLUE SHIELD | Admitting: Internal Medicine

## 2017-10-17 DIAGNOSIS — Z0289 Encounter for other administrative examinations: Secondary | ICD-10-CM

## 2017-11-06 ENCOUNTER — Other Ambulatory Visit (INDEPENDENT_AMBULATORY_CARE_PROVIDER_SITE_OTHER): Payer: BLUE CROSS/BLUE SHIELD

## 2017-11-06 ENCOUNTER — Encounter: Payer: Self-pay | Admitting: Internal Medicine

## 2017-11-06 ENCOUNTER — Ambulatory Visit: Payer: BLUE CROSS/BLUE SHIELD | Admitting: Internal Medicine

## 2017-11-06 VITALS — BP 160/84 | HR 84 | Temp 98.2°F | Resp 16 | Ht 71.0 in | Wt 219.0 lb

## 2017-11-06 DIAGNOSIS — E785 Hyperlipidemia, unspecified: Secondary | ICD-10-CM

## 2017-11-06 DIAGNOSIS — E039 Hypothyroidism, unspecified: Secondary | ICD-10-CM | POA: Diagnosis not present

## 2017-11-06 DIAGNOSIS — E118 Type 2 diabetes mellitus with unspecified complications: Secondary | ICD-10-CM

## 2017-11-06 DIAGNOSIS — I10 Essential (primary) hypertension: Secondary | ICD-10-CM

## 2017-11-06 DIAGNOSIS — Z23 Encounter for immunization: Secondary | ICD-10-CM | POA: Diagnosis not present

## 2017-11-06 DIAGNOSIS — E559 Vitamin D deficiency, unspecified: Secondary | ICD-10-CM

## 2017-11-06 DIAGNOSIS — E781 Pure hyperglyceridemia: Secondary | ICD-10-CM

## 2017-11-06 LAB — LDL CHOLESTEROL, DIRECT: Direct LDL: 102 mg/dL

## 2017-11-06 LAB — COMPREHENSIVE METABOLIC PANEL
ALT: 15 U/L (ref 0–53)
AST: 11 U/L (ref 0–37)
Albumin: 4.4 g/dL (ref 3.5–5.2)
Alkaline Phosphatase: 97 U/L (ref 39–117)
BILIRUBIN TOTAL: 0.3 mg/dL (ref 0.2–1.2)
BUN: 12 mg/dL (ref 6–23)
CO2: 26 meq/L (ref 19–32)
Calcium: 9.6 mg/dL (ref 8.4–10.5)
Chloride: 104 mEq/L (ref 96–112)
Creatinine, Ser: 0.99 mg/dL (ref 0.40–1.50)
GFR: 97.86 mL/min (ref >60.00)
Glucose, Bld: 165 mg/dL — ABNORMAL HIGH (ref 70–99)
POTASSIUM: 3.6 meq/L (ref 3.5–5.1)
Sodium: 139 mEq/L (ref 135–145)
Total Protein: 7.4 g/dL (ref 6.0–8.3)

## 2017-11-06 LAB — LIPID PANEL
Cholesterol: 181 mg/dL (ref 0–200)
HDL: 55.9 mg/dL (ref >39.00)
NONHDL: 125.04
TRIGLYCERIDES: 256 mg/dL — AB (ref 0.0–149.0)
Total CHOL/HDL Ratio: 3
VLDL: 51.2 mg/dL — ABNORMAL HIGH (ref 0.0–40.0)

## 2017-11-06 MED ORDER — OLMESARTAN-AMLODIPINE-HCTZ 40-10-12.5 MG PO TABS: 1.0000 | ORAL_TABLET | Freq: Every day | ORAL | 1 refills | Status: DC

## 2017-11-06 MED ORDER — EMPAGLIFLOZIN 25 MG PO TABS: 25.0000 mg | ORAL_TABLET | Freq: Every day | ORAL | 1 refills | Status: DC

## 2017-11-06 NOTE — Progress Notes (Signed)
Subjective:  Patient ID: Terry Harrington, male    DOB: 1955-05-10  Age: 61 y.o. MRN: [id number]  CC: Hypertension; Diabetes; Hyperlipidemia; and Hypothyroidism   HPI Terry Harrington presents for f/up - He feels well today and offers no complaints.  His last A1c was at 6.8%.  He wants to know if there is something he can do to get better control of his blood sugar.  He is concerned that his blood pressure is not well controlled.  He tells me he has been compliant with the telmisartan.  He denies any recent episodes of headache, blurred vision, chest pain, shortness of breath, or edema.  Outpatient Medications Prior to Visit  Medication Sig Dispense Refill  . Dulaglutide (TRULICITY) 1.5 VF/6.4PP SOPN Inject 1.5 mg into the skin once a week. 12 pen 3  . pioglitazone-metformin (ACTOPLUS MET) 15-850 MG tablet TAKE 2 TABLETS EVERY DAY WITH SUPPER 180 tablet 1  . sildenafil (VIAGRA) 100 MG tablet Take 1 tablet (100 mg total) by mouth daily as needed for erectile dysfunction. 30 tablet 5  . testosterone (ANDROGEL) 50 MG/5GM (1%) GEL Place 5 g onto the skin daily. 30 Package 5  . vitamin B-12 (CYANOCOBALAMIN) 1000 MCG tablet Take 2,000 mcg by mouth daily.    . cholecalciferol (VITAMIN D) 1000 UNITS tablet Take 2,000 Units by mouth daily.    . empagliflozin (JARDIANCE) 10 MG TABS tablet Take 10 mg by mouth daily. 90 tablet 3  . levothyroxine (SYNTHROID, LEVOTHROID) 100 MCG tablet TAKE 1 TABLET BY MOUTH EVERY DAY 90 tablet 0  . meclizine (ANTIVERT) 25 MG tablet TAKE 1 TABLET BY MOUTH 3 TIMES DAILY AS NEEDED FOR DIZZINESS. 30 tablet 1  . pravastatin (PRAVACHOL) 40 MG tablet TAKE 1 TABLET BY MOUTH EVERY DAY 90 tablet 1  . telmisartan (MICARDIS) 40 MG tablet TAKE 1 TABLET BY MOUTH EVERY DAY 90 tablet 1   No facility-administered medications prior to visit.     ROS Review of Systems  Constitutional: Negative for chills, diaphoresis, fatigue and fever.  HENT: Negative.   Eyes: Negative for visual  disturbance.  Respiratory: Negative for cough, chest tightness, shortness of breath and wheezing.   Cardiovascular: Negative for chest pain, palpitations and leg swelling.  Gastrointestinal: Negative for abdominal pain, constipation, diarrhea, nausea and vomiting.  Endocrine: Negative for cold intolerance and heat intolerance.  Genitourinary: Negative.  Negative for difficulty urinating and dysuria.  Musculoskeletal: Negative.  Negative for arthralgias and myalgias.  Skin: Negative for color change, pallor and rash.  Neurological: Negative.  Negative for dizziness, weakness and light-headedness.  Hematological: Negative for adenopathy. Does not bruise/bleed easily.  Psychiatric/Behavioral: Negative.     Objective:  BP (!) 160/84 (BP Location: Left Arm, Patient Position: Sitting, Cuff Size: Normal)   Pulse 84   Temp 98.2 F (36.8 C) (Oral)   Resp 16   Ht '5\' 11"'$  (1.803 m)   Wt 219 lb (99.3 kg)   SpO2 98%   BMI 30.54 kg/m   BP Readings from Last 3 Encounters:  11/06/17 (!) 160/84  08/31/17 (!) 152/72  04/16/17 120/72    Wt Readings from Last 3 Encounters:  11/06/17 219 lb (99.3 kg)  08/31/17 214 lb 3.2 oz (97.2 kg)  04/16/17 215 lb (97.5 kg)    Physical Exam  Constitutional: He is oriented to person, place, and time. No distress.  HENT:  Mouth/Throat: Oropharynx is clear and moist. No oropharyngeal exudate.  Eyes: Conjunctivae are normal. No scleral icterus.  Neck: Normal  range of motion. Neck supple. No JVD present. No thyromegaly present.  Cardiovascular: Normal rate, regular rhythm and normal heart sounds. Exam reveals no gallop.  No murmur heard. Pulmonary/Chest: Effort normal and breath sounds normal. No respiratory distress. He has no wheezes. He has no rales.  Abdominal: Soft. Bowel sounds are normal. He exhibits no mass. There is no hepatosplenomegaly. There is no tenderness.  Musculoskeletal: Normal range of motion. He exhibits no edema, tenderness or deformity.    Lymphadenopathy:    He has no cervical adenopathy.  Neurological: He is alert and oriented to person, place, and time.  Skin: Skin is warm and dry. No rash noted. He is not diaphoretic. No erythema.  Vitals reviewed.   Lab Results  Component Value Date   WBC 6.2 01/10/2017   HGB 15.9 01/10/2017   HCT 45.2 01/10/2017   PLT 305 01/10/2017   GLUCOSE 165 (H) 11/06/2017   CHOL 181 11/06/2017   TRIG 256.0 (H) 11/06/2017   HDL 55.90 11/06/2017   LDLDIRECT 102.0 11/06/2017   LDLCALC 77 08/21/2016   ALT 15 11/06/2017   AST 11 11/06/2017   NA 139 11/06/2017   K 3.6 11/06/2017   CL 104 11/06/2017   CREATININE 0.99 11/06/2017   BUN 12 11/06/2017   CO2 26 11/06/2017   TSH 2.92 11/06/2017   PSA 0.24 12/27/2016   HGBA1C 6.8 (A) 08/31/2017   MICROALBUR <0.7 04/16/2017    No results found.  Assessment & Plan:   Terry Harrington was seen today for hypertension, diabetes, hyperlipidemia and hypothyroidism.  Diagnoses and all orders for this visit:  Need for influenza vaccination -     Flu Vaccine QUAD 36+ mos IM  Hyperlipidemia with target LDL less than 100-he has achieved his LDL goal is doing well on the statin. -     Lipid panel; Future -     pravastatin (PRAVACHOL) 40 MG tablet; Take 1 tablet (40 mg total) by mouth daily.  Type II diabetes mellitus with manifestations (HCC)-his A1c is at 6.8%.  Will increase the dose of Jardiance for better blood sugar control and for overall CV risk reduction. -     Comprehensive metabolic panel; Future -     empagliflozin (JARDIANCE) 25 MG TABS tablet; Take 25 mg by mouth daily. -     Discontinue: Olmesartan-amLODIPine-HCTZ 40-10-12.5 MG TABS; Take 1 tablet by mouth daily. -     Discontinue: Olmesartan-amLODIPine-HCTZ 40-10-12.5 MG TABS; Take 1 tablet by mouth daily. -     Olmesartan-amLODIPine-HCTZ 40-10-12.5 MG TABS; Take 1 tablet by mouth daily.  Essential hypertension- His blood pressure is not adequately well controlled.  I will treat the  vitamin D deficiency.  Otherwise, his labs are negative for secondary call causes or endorgan damage.  I will upgrade him to a more potent ARB and will also add CCB and thiazide diuretic. -     Comprehensive metabolic panel; Future -     Discontinue: Olmesartan-amLODIPine-HCTZ 40-10-12.5 MG TABS; Take 1 tablet by mouth daily. -     VITAMIN D 25 Hydroxy (Vit-D Deficiency, Fractures); Future -     Discontinue: Olmesartan-amLODIPine-HCTZ 40-10-12.5 MG TABS; Take 1 tablet by mouth daily. -     Olmesartan-amLODIPine-HCTZ 40-10-12.5 MG TABS; Take 1 tablet by mouth daily.  Hypothyroidism (acquired)-his TSH is in the normal range.  He will remain on the current dose of levothyroxine. -     TSH; Future -     levothyroxine (SYNTHROID, LEVOTHROID) 100 MCG tablet; Take 1 tablet (100 mcg  total) by mouth daily.  Vitamin D deficiency disease -     Cholecalciferol 50000 units capsule; Take 1 capsule (50,000 Units total) by mouth once a week.  Pure hypertriglyceridemia- His trigs are mildly elevated.  He has an elevated ASCVD risk score but he is also allergic to shellfish.  Therefore, omega-3 fish oils are contraindicated.  His trigs or not high enough to warrant other medical therapy.  He was encouraged to improve his lifestyle modifications.   I have discontinued Andrey Campanile. Kehm's cholecalciferol, empagliflozin, meclizine, and telmisartan. I have also changed his levothyroxine and pravastatin. Additionally, I am having him start on empagliflozin and Cholecalciferol. Lastly, I am having him maintain his vitamin B-12, Dulaglutide, sildenafil, pioglitazone-metformin, testosterone, and Olmesartan-amLODIPine-HCTZ.  Meds ordered this encounter  Medications  . empagliflozin (JARDIANCE) 25 MG TABS tablet    Sig: Take 25 mg by mouth daily.    Dispense:  90 tablet    Refill:  1  . DISCONTD: Olmesartan-amLODIPine-HCTZ 40-10-12.5 MG TABS    Sig: Take 1 tablet by mouth daily.    Dispense:  90 tablet    Refill:   1  . DISCONTD: Olmesartan-amLODIPine-HCTZ 40-10-12.5 MG TABS    Sig: Take 1 tablet by mouth daily.    Dispense:  90 tablet    Refill:  1  . Olmesartan-amLODIPine-HCTZ 40-10-12.5 MG TABS    Sig: Take 1 tablet by mouth daily.    Dispense:  90 tablet    Refill:  1  . Cholecalciferol 50000 units capsule    Sig: Take 1 capsule (50,000 Units total) by mouth once a week.    Dispense:  12 capsule    Refill:  1  . levothyroxine (SYNTHROID, LEVOTHROID) 100 MCG tablet    Sig: Take 1 tablet (100 mcg total) by mouth daily.    Dispense:  90 tablet    Refill:  1  . pravastatin (PRAVACHOL) 40 MG tablet    Sig: Take 1 tablet (40 mg total) by mouth daily.    Dispense:  90 tablet    Refill:  1     Follow-up: Return in about 3 months (around 02/06/2018).  Scarlette Calico, MD

## 2017-11-06 NOTE — Patient Instructions (Signed)

## 2017-11-07 ENCOUNTER — Encounter: Payer: Self-pay | Admitting: Internal Medicine

## 2017-11-07 DIAGNOSIS — E559 Vitamin D deficiency, unspecified: Secondary | ICD-10-CM | POA: Insufficient documentation

## 2017-11-07 DIAGNOSIS — E781 Pure hyperglyceridemia: Secondary | ICD-10-CM | POA: Insufficient documentation

## 2017-11-07 LAB — TSH: TSH: 2.92 u[IU]/mL (ref 0.35–4.50)

## 2017-11-07 LAB — VITAMIN D 25 HYDROXY (VIT D DEFICIENCY, FRACTURES): VITD: 8.84 ng/mL — AB (ref 30.00–100.00)

## 2017-11-07 MED ORDER — CHOLECALCIFEROL 1.25 MG (50000 UT) PO CAPS: 50000.0000 [IU] | ORAL_CAPSULE | ORAL | 1 refills | Status: DC

## 2017-11-07 MED ORDER — PRAVASTATIN SODIUM 40 MG PO TABS: 40.0000 mg | ORAL_TABLET | Freq: Every day | ORAL | 1 refills | Status: DC

## 2017-11-07 MED ORDER — LEVOTHYROXINE SODIUM 100 MCG PO TABS: 100.0000 ug | ORAL_TABLET | Freq: Every day | ORAL | 1 refills | Status: DC

## 2017-11-22 ENCOUNTER — Other Ambulatory Visit: Payer: Self-pay

## 2017-11-22 MED ORDER — DULAGLUTIDE 1.5 MG/0.5ML ~~LOC~~ SOAJ: 1.5000 mg | SUBCUTANEOUS | 3 refills | Status: DC

## 2017-11-22 NOTE — Telephone Encounter (Signed)
Received notification from [facility] re: pt request to refill Trulicity. Request authorized and refill sent as requested.

## 2017-11-29 ENCOUNTER — Other Ambulatory Visit: Payer: Self-pay | Admitting: Internal Medicine

## 2017-11-29 DIAGNOSIS — E118 Type 2 diabetes mellitus with unspecified complications: Secondary | ICD-10-CM

## 2017-11-29 MED ORDER — EMPAGLIFLOZIN 25 MG PO TABS: 25.0000 mg | ORAL_TABLET | Freq: Every day | ORAL | 1 refills | Status: DC

## 2017-12-29 ENCOUNTER — Other Ambulatory Visit: Payer: Self-pay | Admitting: Internal Medicine

## 2018-02-01 ENCOUNTER — Other Ambulatory Visit: Payer: Self-pay | Admitting: Endocrinology

## 2018-02-06 ENCOUNTER — Encounter: Payer: Self-pay | Admitting: Internal Medicine

## 2018-02-06 ENCOUNTER — Ambulatory Visit: Payer: BLUE CROSS/BLUE SHIELD | Admitting: Internal Medicine

## 2018-02-06 ENCOUNTER — Other Ambulatory Visit (INDEPENDENT_AMBULATORY_CARE_PROVIDER_SITE_OTHER): Payer: BLUE CROSS/BLUE SHIELD

## 2018-02-06 VITALS — BP 130/60 | HR 74 | Temp 97.9°F | Ht 71.0 in | Wt 211.5 lb

## 2018-02-06 DIAGNOSIS — E039 Hypothyroidism, unspecified: Secondary | ICD-10-CM

## 2018-02-06 DIAGNOSIS — E559 Vitamin D deficiency, unspecified: Secondary | ICD-10-CM | POA: Diagnosis not present

## 2018-02-06 DIAGNOSIS — Z0000 Encounter for general adult medical examination without abnormal findings: Secondary | ICD-10-CM

## 2018-02-06 DIAGNOSIS — I10 Essential (primary) hypertension: Secondary | ICD-10-CM | POA: Diagnosis not present

## 2018-02-06 DIAGNOSIS — E118 Type 2 diabetes mellitus with unspecified complications: Secondary | ICD-10-CM | POA: Diagnosis not present

## 2018-02-06 DIAGNOSIS — Z125 Encounter for screening for malignant neoplasm of prostate: Secondary | ICD-10-CM | POA: Diagnosis not present

## 2018-02-06 LAB — CBC WITH DIFFERENTIAL/PLATELET
Basophils Absolute: 0 10*3/uL (ref 0.0–0.1)
Basophils Relative: 0.6 % (ref 0.0–3.0)
Eosinophils Absolute: 0 10*3/uL (ref 0.0–0.7)
Eosinophils Relative: 0.7 % (ref 0.0–5.0)
HCT: 43.2 % (ref 39.0–52.0)
Hemoglobin: 14.4 g/dL (ref 13.0–17.0)
LYMPHS ABS: 2 10*3/uL (ref 0.7–4.0)
Lymphocytes Relative: 34.7 % (ref 12.0–46.0)
MCHC: 33.4 g/dL (ref 30.0–36.0)
MCV: 91.4 fl (ref 78.0–100.0)
MONO ABS: 0.4 10*3/uL (ref 0.1–1.0)
Monocytes Relative: 6.7 % (ref 3.0–12.0)
Neutro Abs: 3.3 10*3/uL (ref 1.4–7.7)
Neutrophils Relative %: 57.3 % (ref 43.0–77.0)
Platelets: 293 10*3/uL (ref 150.0–400.0)
RBC: 4.73 Mil/uL (ref 4.22–5.81)
RDW: 14 % (ref 11.5–15.5)
WBC: 5.8 10*3/uL (ref 4.0–10.5)

## 2018-02-06 LAB — URINALYSIS, ROUTINE W REFLEX MICROSCOPIC
Bilirubin Urine: NEGATIVE
Hgb urine dipstick: NEGATIVE
Ketones, ur: NEGATIVE
Leukocytes, UA: NEGATIVE
Nitrite: NEGATIVE
RBC / HPF: NONE SEEN (ref 0–?)
Specific Gravity, Urine: 1.025 (ref 1.000–1.030)
Total Protein, Urine: NEGATIVE
Urobilinogen, UA: 0.2 (ref 0.0–1.0)
pH: 6 (ref 5.0–8.0)

## 2018-02-06 LAB — COMPREHENSIVE METABOLIC PANEL
ALT: 11 U/L (ref 0–53)
AST: 11 U/L (ref 0–37)
Albumin: 4.4 g/dL (ref 3.5–5.2)
Alkaline Phosphatase: 73 U/L (ref 39–117)
BUN: 24 mg/dL — ABNORMAL HIGH (ref 6–23)
CO2: 26 mEq/L (ref 19–32)
CREATININE: 1.29 mg/dL (ref 0.40–1.50)
Calcium: 10.1 mg/dL (ref 8.4–10.5)
Chloride: 102 mEq/L (ref 96–112)
GFR: 67.78 mL/min (ref >60.00)
Glucose, Bld: 106 mg/dL — ABNORMAL HIGH (ref 70–99)
Potassium: 4.3 mEq/L (ref 3.5–5.1)
SODIUM: 136 meq/L (ref 135–145)
Total Bilirubin: 0.3 mg/dL (ref 0.2–1.2)
Total Protein: 7.4 g/dL (ref 6.0–8.3)

## 2018-02-06 LAB — PSA: PSA: 0.71 ng/mL (ref 0.10–4.00)

## 2018-02-06 LAB — HEMOGLOBIN A1C: Hgb A1c MFr Bld: 6.8 % — ABNORMAL HIGH (ref 4.6–6.5)

## 2018-02-06 LAB — MICROALBUMIN / CREATININE URINE RATIO
CREATININE, U: 164.8 mg/dL
Microalb Creat Ratio: 0.4 mg/g (ref 0.0–30.0)
Microalb, Ur: <0.7 mg/dL (ref 0.0–1.9)

## 2018-02-06 LAB — LIPID PANEL
Cholesterol: 161 mg/dL (ref 0–200)
HDL: 43.8 mg/dL (ref >39.00)
NONHDL: 117.3
Total CHOL/HDL Ratio: 4
Triglycerides: 208 mg/dL — ABNORMAL HIGH (ref 0.0–149.0)
VLDL: 41.6 mg/dL — ABNORMAL HIGH (ref 0.0–40.0)

## 2018-02-06 LAB — VITAMIN D 25 HYDROXY (VIT D DEFICIENCY, FRACTURES): VITD: 46.74 ng/mL (ref 30.00–100.00)

## 2018-02-06 LAB — LDL CHOLESTEROL, DIRECT: Direct LDL: 82 mg/dL

## 2018-02-06 LAB — TSH: TSH: 0.83 u[IU]/mL (ref 0.35–4.50)

## 2018-02-06 NOTE — Progress Notes (Signed)
Subjective:  Patient ID: Terry Harrington, male    DOB: 03-01-1955  Age: 63 y.o. MRN: [id number]  CC: Annual Exam; Hypertension; and Diabetes   HPI Terry Harrington presents for a CPX.  He feels well today and offers no complaints.  He tells me his blood pressures and blood sugar have been well controlled.  He is active and denies any recent episodes of CP, DOE, palpitations, edema, or fatigue.  Outpatient Medications Prior to Visit  Medication Sig Dispense Refill  . Cholecalciferol 50000 units capsule Take 1 capsule (50,000 Units total) by mouth once a week. 12 capsule 1  . Dulaglutide (TRULICITY) 1.5 KV/4.2VZ SOPN Inject 1.5 mg into the skin once a week. 12 pen 3  . empagliflozin (JARDIANCE) 25 MG TABS tablet Take 25 mg by mouth daily. 90 tablet 1  . levothyroxine (SYNTHROID, LEVOTHROID) 100 MCG tablet Take 1 tablet (100 mcg total) by mouth daily. 90 tablet 1  . loratadine (CLARITIN) 10 MG tablet Take 10 mg by mouth daily.    . Olmesartan-amLODIPine-HCTZ 40-10-12.5 MG TABS Take 1 tablet by mouth daily. 90 tablet 1  . pioglitazone-metformin (ACTOPLUS MET) 15-850 MG tablet TAKE 2 TABLETS EVERY DAY WITH SUPPER 180 tablet 0  . Potassium Chloride (K+ POTASSIUM PO) Take by mouth.    . pravastatin (PRAVACHOL) 40 MG tablet Take 1 tablet (40 mg total) by mouth daily. 90 tablet 1  . sildenafil (VIAGRA) 100 MG tablet Take 1 tablet (100 mg total) by mouth daily as needed for erectile dysfunction. 30 tablet 5  . testosterone (ANDROGEL) 50 MG/5GM (1%) GEL Place 5 g onto the skin daily. 30 Package 5  . vitamin B-12 (CYANOCOBALAMIN) 1000 MCG tablet Take 2,000 mcg by mouth daily.    Marland Kitchen JARDIANCE 10 MG TABS tablet TAKE 10 MG BY MOUTH DAILY. 90 tablet 3   No facility-administered medications prior to visit.     ROS Review of Systems  Constitutional: Negative.  Negative for diaphoresis, fatigue and unexpected weight change.  HENT: Negative.   Eyes: Negative for visual disturbance.  Respiratory:  Negative for cough, chest tightness, shortness of breath and wheezing.   Cardiovascular: Negative.  Negative for chest pain, palpitations and leg swelling.  Gastrointestinal: Negative for abdominal pain, constipation, diarrhea, nausea and vomiting.  Endocrine: Negative for polydipsia, polyphagia and polyuria.  Genitourinary: Negative.  Negative for difficulty urinating, dysuria, penile swelling, scrotal swelling, testicular pain and urgency.  Musculoskeletal: Negative.  Negative for arthralgias, back pain, myalgias and neck pain.  Skin: Negative.   Neurological: Negative.  Negative for dizziness, weakness, light-headedness, numbness and headaches.  Hematological: Negative for adenopathy. Does not bruise/bleed easily.  Psychiatric/Behavioral: Negative.     Objective:  BP 130/60 (BP Location: Left Arm, Patient Position: Sitting, Cuff Size: Large)   Pulse 74   Temp 97.9 F (36.6 C) (Oral)   Ht _0  (1.803 m)   Wt 211 lb 8 oz (95.9 kg)   SpO2 97%   BMI 29.50 kg/m   BP Readings from Last 3 Encounters:  02/06/18 130/60  11/06/17 (!) 160/84  08/31/17 (!) 152/72    Wt Readings from Last 3 Encounters:  02/06/18 211 lb 8 oz (95.9 kg)  11/06/17 219 lb (99.3 kg)  08/31/17 214 lb 3.2 oz (97.2 kg)    Physical Exam Vitals signs reviewed.  Constitutional:      Appearance: He is not ill-appearing.  HENT:     Nose: Nose normal. No congestion or rhinorrhea.     Mouth/Throat:  Mouth: Mucous membranes are moist.     Pharynx: Oropharynx is clear. No oropharyngeal exudate or posterior oropharyngeal erythema.  Eyes:     General: No scleral icterus.    Conjunctiva/sclera: Conjunctivae normal.  Neck:     Musculoskeletal: Normal range of motion and neck supple. No neck rigidity or muscular tenderness.  Cardiovascular:     Rate and Rhythm: Normal rate and regular rhythm.     Pulses: Normal pulses.     Heart sounds: No murmur. No gallop.   Pulmonary:     Effort: Pulmonary effort is  normal.     Breath sounds: No stridor. No wheezing, rhonchi or rales.  Abdominal:     General: Bowel sounds are normal.     Palpations: There is no hepatomegaly, splenomegaly or mass.     Tenderness: There is no abdominal tenderness. There is no guarding.     Hernia: No hernia is present. There is no hernia in the right inguinal area or left inguinal area.  Genitourinary:    Pubic Area: No rash.      Penis: Normal and circumcised. No discharge, swelling or lesions.      Scrotum/Testes: Normal.        Right: Mass, tenderness or swelling not present.        Left: Mass, tenderness or swelling not present.     Epididymis:     Right: Normal.     Left: Normal.     Prostate: Normal. Not enlarged, not tender and no nodules present.     Rectum: Normal. Guaiac result negative. No mass, tenderness, anal fissure, external hemorrhoid or internal hemorrhoid. Normal anal tone.  Musculoskeletal: Normal range of motion.        General: No swelling.     Right lower leg: No edema.     Left lower leg: No edema.  Lymphadenopathy:     Cervical: No cervical adenopathy.     Lower Body: No right inguinal adenopathy. No left inguinal adenopathy.  Skin:    General: Skin is warm and dry.     Findings: No erythema.  Neurological:     General: No focal deficit present.     Mental Status: He is oriented to person, place, and time. Mental status is at baseline.  Psychiatric:        Mood and Affect: Mood normal.        Behavior: Behavior normal.        Thought Content: Thought content normal.        Judgment: Judgment normal.     Lab Results  Component Value Date   WBC 5.8 02/06/2018   HGB 14.4 02/06/2018   HCT 43.2 02/06/2018   PLT 293.0 02/06/2018   GLUCOSE 106 (H) 02/06/2018   CHOL 161 02/06/2018   TRIG 208.0 (H) 02/06/2018   HDL 43.80 02/06/2018   LDLDIRECT 82.0 02/06/2018   LDLCALC 77 08/21/2016   ALT 11 02/06/2018   AST 11 02/06/2018   NA 136 02/06/2018   K 4.3 02/06/2018   CL 102  02/06/2018   CREATININE 1.29 02/06/2018   BUN 24 (H) 02/06/2018   CO2 26 02/06/2018   TSH 0.83 02/06/2018   PSA 0.71 02/06/2018   HGBA1C 6.8 (H) 02/06/2018   MICROALBUR <0.7 02/06/2018    No results found.  Assessment & Plan:   Maurice was seen today for annual exam, hypertension and diabetes.  Diagnoses and all orders for this visit:  Essential hypertension- His blood pressure is well controlled.  Electrolytes and renal function are normal. -     CBC with Differential/Platelet; Future -     Comprehensive metabolic panel; Future -     TSH; Future -     Urinalysis, Routine w reflex microscopic; Future  Type II diabetes mellitus with manifestations ([place])- His A1c is at 6.8%.  His blood sugars are adequately well controlled.  Will continue the current 4 drug combination. -     Comprehensive metabolic panel; Future -     Hemoglobin A1c; Future -     Microalbumin / creatinine urine ratio; Future  Hypothyroidism (acquired)- His TSH is in the normal range.  He will remain on the current dose of levothyroxine. -     TSH; Future  Routine general medical examination at a health care facility- Exam completed, labs reviewed, vaccines reviewed and updated, screening for colon cancer is up-to-date, patient education material was given. -     Lipid panel; Future -     PSA; Future  Vitamin D deficiency disease- His vitamin D level is now in the normal range.  Will continue the current vitamin D supplement. -     VITAMIN D 25 Hydroxy (Vit-D Deficiency, Fractures); Future   I have discontinued Levy Cedano. Mcveigh's JARDIANCE. I am also having him maintain his vitamin B-12, sildenafil, testosterone, Olmesartan-amLODIPine-HCTZ, Cholecalciferol, levothyroxine, pravastatin, Dulaglutide, empagliflozin, pioglitazone-metformin, Potassium Chloride (K+ POTASSIUM PO), and loratadine.  No orders of the defined types were placed in this encounter.    Follow-up: Return in about 6 months (around  08/07/2018).  Scarlette Calico, MD

## 2018-02-06 NOTE — Patient Instructions (Signed)

## 2018-02-07 ENCOUNTER — Encounter: Payer: Self-pay | Admitting: Internal Medicine

## 2018-03-04 ENCOUNTER — Encounter: Payer: Self-pay | Admitting: Endocrinology

## 2018-03-04 ENCOUNTER — Ambulatory Visit: Payer: BLUE CROSS/BLUE SHIELD | Admitting: Endocrinology

## 2018-03-08 ENCOUNTER — Other Ambulatory Visit: Payer: Self-pay

## 2018-03-08 ENCOUNTER — Ambulatory Visit: Payer: BLUE CROSS/BLUE SHIELD | Admitting: Endocrinology

## 2018-03-08 ENCOUNTER — Encounter: Payer: Self-pay | Admitting: Endocrinology

## 2018-03-08 VITALS — BP 144/82 | HR 79 | Ht 71.0 in | Wt 209.6 lb

## 2018-03-08 DIAGNOSIS — E118 Type 2 diabetes mellitus with unspecified complications: Secondary | ICD-10-CM

## 2018-03-08 MED ORDER — TESTOSTERONE 25 MG/2.5GM (1%) TD GEL: 5.0000 g | Freq: Every day | TRANSDERMAL | 5 refills | Status: DC

## 2018-03-08 NOTE — Progress Notes (Signed)
Subjective:    Patient ID: Terry Harrington, male    DOB: [birth year], 63 y.o.   MRN: [id number]  HPI Pt returns for f/u of diabetes mellitus:  DM type: 2 Dx'ed: 0174 Complications: renal insuff Therapy: trulicity and 3 oral meds.  DKA: never.  Severe hypoglycemia: never.  Pancreatitis: never Other: he took insulin 2015-2017; in early 2017, he was hospitalized for nonketotic hyperosmolar hyperglycemic state, due to missing insulin doses, he did not tolerate farxiga (arthralgias); he works M-F, day shift.  He says cbg's are higher on weekends.  Interval history: pt says cbg's are well-controlled.  pt states he feels well in general.   Pt also has primary hypogonadism (dx'ed 2018; he has lifelong infertility, but karyotype was normal; he took androgel 2012-2014; testosterone level was low, but he is on max safe dosage (50 mg/d)).  He has ED sxs are slightly better.  He says pharmacy has been giving him just 30 packs per month.   Past Medical History:  Diagnosis Date  . Diabetes mellitus   . GERD (gastroesophageal reflux disease)   . Heart murmur   . Hyperlipidemia   . Hypertension    on meds  . Hypothyroidism    on meds    Past Surgical History:  Procedure Laterality Date  . DENTAL SURGERY     several dental procedure approx 10 between 1999-2012  . HERNIA REPAIR     as a child  . SHOULDER ARTHROSCOPY  05/31/2011   Procedure: ARTHROSCOPY SHOULDER;  Surgeon: Sharmon Revere, MD;  Location: [facility];  Service: Orthopedics;  Laterality: Left;  Shoulder Acromioplasty Left  . SHOULDER SURGERY  05/31/2011    impingment left    Social History   Socioeconomic History  . Marital status: Divorced    Spouse name: Not on file  . Number of children: Not on file  . Years of education: Not on file  . Highest education level: Not on file  Occupational History  . Occupation: Barista  . Financial resource strain: Not on file  . Food insecurity:    Worry: Not on file    Inability: Not on file  . Transportation needs:    Medical: Not on file    Non-medical: Not on file  Tobacco Use  . Smoking status: Former Smoker    Packs/day: 1.50    Years: 26.00    Pack years: 39.00    Types: Cigarettes    Last attempt to quit: 03/02/2008    Years since quitting: 10.0  . Smokeless tobacco: Former Systems developer    Quit date: 05/25/2008  Substance and Sexual Activity  . Alcohol use: Yes    Alcohol/week: 0.0 standard drinks    Comment: rarely  . Drug use: No  . Sexual activity: Not Currently  Lifestyle  . Physical activity:    Days per week: Not on file    Minutes per session: Not on file  . Stress: Not on file  Relationships  . Social connections:    Talks on phone: Not on file    Gets together: Not on file    Attends religious service: Not on file    Active member of club or organization: Not on file    Attends meetings of clubs or organizations: Not on file    Relationship status: Not on file  . Intimate partner violence:    Fear of current or ex partner: Not on file    Emotionally abused: Not on file  Physically abused: Not on file    Forced sexual activity: Not on file  Other Topics Concern  . Not on file  Social History Narrative  . Not on file    Current Outpatient Medications on File Prior to Visit  Medication Sig Dispense Refill  . Cholecalciferol 50000 units capsule Take 1 capsule (50,000 Units total) by mouth once a week. 12 capsule 1  . Dulaglutide (TRULICITY) 1.5 TD/3.2KG SOPN Inject 1.5 mg into the skin once a week. 12 pen 3  . empagliflozin (JARDIANCE) 25 MG TABS tablet Take 25 mg by mouth daily. 90 tablet 1  . levothyroxine (SYNTHROID, LEVOTHROID) 100 MCG tablet Take 1 tablet (100 mcg total) by mouth daily. 90 tablet 1  . loratadine (CLARITIN) 10 MG tablet Take 10 mg by mouth daily.    . Olmesartan-amLODIPine-HCTZ 40-10-12.5 MG TABS Take 1 tablet by mouth daily. 90 tablet 1  . pioglitazone-metformin (ACTOPLUS MET) 15-850 MG tablet TAKE 2  TABLETS EVERY DAY WITH SUPPER 180 tablet 0  . Potassium Chloride (K+ POTASSIUM PO) Take by mouth.    . pravastatin (PRAVACHOL) 40 MG tablet Take 1 tablet (40 mg total) by mouth daily. 90 tablet 1  . sildenafil (VIAGRA) 100 MG tablet Take 1 tablet (100 mg total) by mouth daily as needed for erectile dysfunction. 30 tablet 5  . vitamin B-12 (CYANOCOBALAMIN) 1000 MCG tablet Take 2,000 mcg by mouth daily.     No current facility-administered medications on file prior to visit.     Allergies  Allergen Reactions  . Crab [Shellfish Allergy] Anaphylaxis  . Iodine Anaphylaxis  . Lisinopril     cough  . Crestor [Rosuvastatin]     Muscle aches  . Wilder Glade [Dapagliflozin] Other (See Comments)    Joint pain and weakness  . Iohexol Swelling  . Peanut-Containing Drug Products Itching    And whelps  . Aspirin Rash  . Codeine Rash  . Penicillins Rash    Has patient had a PCN reaction causing immediate rash, facial/tongue/throat swelling, SOB or lightheadedness with hypotension: Yes Has patient had a PCN reaction causing severe rash involving mucus membranes or skin necrosis: Yes Has patient had a PCN reaction that required hospitalization No Has patient had a PCN reaction occurring within the last 10 years: No If all of the above answers are "NO", then may proceed with Cephalosporin use.     Family History  Problem Relation Age of Onset  . Hyperlipidemia Mother   . Hypertension Mother   . Stroke Father   . Kidney disease Father   . Diabetes Father   . Cancer Maternal Grandfather   . Anesthesia problems Neg Hx   . Hypotension Neg Hx   . Malignant hyperthermia Neg Hx   . Pseudochol deficiency Neg Hx     BP (!) 144/82 (BP Location: Left Arm, Patient Position: Sitting, Cuff Size: Normal)   Pulse 79   Ht '5\' 11"'$  (1.803 m)   Wt 209 lb 9.6 oz (95.1 kg)   SpO2 94%   BMI 29.23 kg/m   Review of Systems He denies hypoglycemia    Objective:   Physical Exam VITAL SIGNS:  See vs  page GENERAL: no distress Pulses: dorsalis pedis intact bilat.   MSK: no deformity of the feet CV: no leg edema Skin:  no ulcer on the feet.  normal color and temp on the feet. Neuro: sensation is intact to touch on the feet Ext: There is bilateral onychomycosis of the toenails  Lab Results  Component Value Date   HGBA1C 6.8 (H) 02/06/2018   Lab Results  Component Value Date   CREATININE 1.29 02/06/2018   BUN 24 (H) 02/06/2018   NA 136 02/06/2018   K 4.3 02/06/2018   CL 102 02/06/2018   CO2 26 02/06/2018       Assessment & Plan:  HTN: is noted today Type 2 DM: well-controlled Renal insuff: this limits rx options. Hypogonadism: despite low testosterone level, he is on max safe dosage, so he will continue the same  Patient Instructions  Your blood pressure is high today.  Please see your primary care provider soon, to have it rechecked. Please continue the same medications. check your blood sugar once a day.  vary the time of day when you check, between before the 3 meals, and at bedtime.  also check if you have symptoms of your blood sugar being too high or too low.  please keep a record of the readings and bring it to your next appointment here (or you can bring the meter itself).  You can write it on any piece of paper.  please call us sooner if your blood sugar goes below 70, or if you have a lot of readings over 200. Please come back for a follow-up appointment in 6 months.

## 2018-03-08 NOTE — Patient Instructions (Addendum)
Your blood pressure is high today.  Please see your primary care provider soon, to have it rechecked.   Please continue the same medications.   check your blood sugar once a day.  vary the time of day when you check, between before the 3 meals, and at bedtime.  also check if you have symptoms of your blood sugar being too high or too low.  please keep a record of the readings and bring it to your next appointment here (or you can bring the meter itself).  You can write it on any piece of paper.  please call us sooner if your blood sugar goes below 70, or if you have a lot of readings over 200.   Please come back for a follow-up appointment in 6 months.   

## 2018-03-26 ENCOUNTER — Other Ambulatory Visit: Payer: Self-pay | Admitting: Endocrinology

## 2018-03-26 NOTE — Telephone Encounter (Signed)
Please advise if refill is appropriate 

## 2018-06-02 ENCOUNTER — Other Ambulatory Visit: Payer: Self-pay | Admitting: Internal Medicine

## 2018-07-02 ENCOUNTER — Other Ambulatory Visit: Payer: Self-pay | Admitting: Internal Medicine

## 2018-07-02 DIAGNOSIS — E118 Type 2 diabetes mellitus with unspecified complications: Secondary | ICD-10-CM

## 2018-07-02 DIAGNOSIS — I10 Essential (primary) hypertension: Secondary | ICD-10-CM

## 2018-07-10 ENCOUNTER — Other Ambulatory Visit: Payer: Self-pay | Admitting: Internal Medicine

## 2018-07-10 DIAGNOSIS — E559 Vitamin D deficiency, unspecified: Secondary | ICD-10-CM

## 2018-08-03 ENCOUNTER — Other Ambulatory Visit: Payer: Self-pay | Admitting: Internal Medicine

## 2018-08-03 DIAGNOSIS — E559 Vitamin D deficiency, unspecified: Secondary | ICD-10-CM

## 2018-08-07 ENCOUNTER — Ambulatory Visit (INDEPENDENT_AMBULATORY_CARE_PROVIDER_SITE_OTHER): Payer: BC Managed Care – PPO | Admitting: Internal Medicine

## 2018-08-07 ENCOUNTER — Other Ambulatory Visit: Payer: Self-pay

## 2018-08-07 ENCOUNTER — Encounter: Payer: Self-pay | Admitting: Internal Medicine

## 2018-08-07 ENCOUNTER — Other Ambulatory Visit (INDEPENDENT_AMBULATORY_CARE_PROVIDER_SITE_OTHER): Payer: BC Managed Care – PPO

## 2018-08-07 VITALS — BP 126/68 | HR 85 | Temp 98.4°F | Resp 16 | Ht 71.0 in | Wt 210.0 lb

## 2018-08-07 DIAGNOSIS — E781 Pure hyperglyceridemia: Secondary | ICD-10-CM

## 2018-08-07 DIAGNOSIS — E559 Vitamin D deficiency, unspecified: Secondary | ICD-10-CM

## 2018-08-07 DIAGNOSIS — E118 Type 2 diabetes mellitus with unspecified complications: Secondary | ICD-10-CM

## 2018-08-07 DIAGNOSIS — I10 Essential (primary) hypertension: Secondary | ICD-10-CM

## 2018-08-07 LAB — BASIC METABOLIC PANEL
BUN: 19 mg/dL (ref 6–23)
CO2: 26 mEq/L (ref 19–32)
Calcium: 10.2 mg/dL (ref 8.4–10.5)
Chloride: 102 mEq/L (ref 96–112)
Creatinine, Ser: 1.25 mg/dL (ref 0.40–1.50)
GFR: 70.18 mL/min (ref >60.00)
Glucose, Bld: 132 mg/dL — ABNORMAL HIGH (ref 70–99)
Potassium: 3.5 mEq/L (ref 3.5–5.1)
Sodium: 136 mEq/L (ref 135–145)

## 2018-08-07 LAB — TRIGLYCERIDES: Triglycerides: 225 mg/dL — ABNORMAL HIGH (ref 0.0–149.0)

## 2018-08-07 NOTE — Progress Notes (Signed)
Subjective:  Patient ID: Terry Harrington, male    DOB: 04-12-1955  Age: 63 y.o. MRN: [id number]  CC: Hypothyroidism, Diabetes, Hyperlipidemia, and Hypertension   HPI Terry Harrington presents for f/up - He tells me his blood pressure and blood sugars have been well controlled recently.  He has not been working on his lifestyle modifications and has gained 1 pound over the last 6 months.  Outpatient Medications Prior to Visit  Medication Sig Dispense Refill  . Cholecalciferol (VITAMIN D3) 1.25 MG (50000 UT) CAPS TAKE 1 CAPSULE BY MOUTH ONE TIME PER WEEK 4 capsule 0  . Dulaglutide (TRULICITY) 1.5 IR/6.7EL SOPN Inject 1.5 mg into the skin once a week. 12 pen 3  . empagliflozin (JARDIANCE) 25 MG TABS tablet Take 25 mg by mouth daily. 90 tablet 1  . levothyroxine (SYNTHROID, LEVOTHROID) 100 MCG tablet Take 1 tablet (100 mcg total) by mouth daily. 90 tablet 1  . loratadine (CLARITIN) 10 MG tablet Take 10 mg by mouth daily.    . Olmesartan-amLODIPine-HCTZ 40-10-12.5 MG TABS TAKE 1 TABLET BY MOUTH EVERY DAY 90 tablet 0  . pioglitazone-metformin (ACTOPLUS MET) 15-850 MG tablet TAKE 2 TABLETS EVERY DAY WITH SUPPER 180 tablet 0  . Potassium Chloride (K+ POTASSIUM PO) Take by mouth.    . pravastatin (PRAVACHOL) 40 MG tablet Take 1 tablet (40 mg total) by mouth daily. 90 tablet 1  . sildenafil (VIAGRA) 100 MG tablet Take 1 tablet (100 mg total) by mouth daily as needed for erectile dysfunction. 30 tablet 5  . testosterone (ANDROGEL) 50 MG/5GM (1%) GEL APPLY 5 GRAMS ONTO THE SKIN DAILY**NOT COVERED** 90 Package 1  . Testosterone 25 MG/2.5GM (1%) GEL Place 5 g onto the skin daily. 60 Package 5  . vitamin B-12 (CYANOCOBALAMIN) 1000 MCG tablet Take 2,000 mcg by mouth daily.     No facility-administered medications prior to visit.     ROS Review of Systems  Constitutional: Negative for diaphoresis, fatigue and unexpected weight change.  HENT: Negative.   Eyes: Negative for visual disturbance.   Respiratory: Negative for cough, chest tightness, shortness of breath and wheezing.   Cardiovascular: Negative for chest pain, palpitations and leg swelling.  Gastrointestinal: Negative for abdominal pain, constipation, diarrhea, nausea and vomiting.  Endocrine: Negative for cold intolerance, heat intolerance, polydipsia, polyphagia and polyuria.  Genitourinary: Negative.  Negative for difficulty urinating and hematuria.  Musculoskeletal: Negative for arthralgias and myalgias.  Skin: Negative.  Negative for color change and pallor.  Neurological: Negative.  Negative for dizziness, weakness and light-headedness.  Hematological: Negative for adenopathy. Does not bruise/bleed easily.  Psychiatric/Behavioral: Negative.     Objective:  BP 126/68 (BP Location: Left Arm, Patient Position: Sitting, Cuff Size: Large)   Pulse 85   Temp 98.4 F (36.9 C) (Oral)   Resp 16   Ht _0  (1.803 m)   Wt 210 lb (95.3 kg)   SpO2 95%   BMI 29.29 kg/m   BP Readings from Last 3 Encounters:  08/07/18 126/68  03/08/18 (!) 144/82  02/06/18 130/60    Wt Readings from Last 3 Encounters:  08/07/18 210 lb (95.3 kg)  03/08/18 209 lb 9.6 oz (95.1 kg)  02/06/18 211 lb 8 oz (95.9 kg)    Physical Exam Vitals signs reviewed.  Constitutional:      Appearance: He is not ill-appearing or diaphoretic.  HENT:     Nose: Nose normal. No congestion.     Mouth/Throat:     Mouth: Mucous membranes are  moist.  Eyes:     General: No scleral icterus.    Conjunctiva/sclera: Conjunctivae normal.  Neck:     Musculoskeletal: Normal range of motion. No neck rigidity or muscular tenderness.  Cardiovascular:     Rate and Rhythm: Normal rate and regular rhythm.     Pulses: Normal pulses.     Heart sounds: No murmur. No gallop.   Pulmonary:     Effort: Pulmonary effort is normal.     Breath sounds: No stridor. No wheezing, rhonchi or rales.  Abdominal:     General: Abdomen is flat. Bowel sounds are normal. There is  no distension.     Palpations: There is no hepatomegaly, splenomegaly or mass.     Tenderness: There is no abdominal tenderness.  Musculoskeletal: Normal range of motion.     Right lower leg: No edema.     Left lower leg: No edema.  Lymphadenopathy:     Cervical: No cervical adenopathy.  Skin:    General: Skin is warm and dry.     Coloration: Skin is not pale.  Neurological:     General: No focal deficit present.     Mental Status: He is alert.  Psychiatric:        Mood and Affect: Mood normal.        Behavior: Behavior normal.     Lab Results  Component Value Date   WBC 5.8 02/06/2018   HGB 14.4 02/06/2018   HCT 43.2 02/06/2018   PLT 293.0 02/06/2018   GLUCOSE 132 (H) 08/07/2018   CHOL 161 02/06/2018   TRIG 225.0 (H) 08/07/2018   HDL 43.80 02/06/2018   LDLDIRECT 82.0 02/06/2018   LDLCALC 77 08/21/2016   ALT 11 02/06/2018   AST 11 02/06/2018   NA 136 08/07/2018   K 3.5 08/07/2018   CL 102 08/07/2018   CREATININE 1.25 08/07/2018   BUN 19 08/07/2018   CO2 26 08/07/2018   TSH 0.83 02/06/2018   PSA 0.71 02/06/2018   HGBA1C 7.0 (H) 08/07/2018   MICROALBUR <0.7 02/06/2018    No results found.  Assessment & Plan:   Terry Harrington was seen today for hypothyroidism, diabetes, hyperlipidemia and hypertension.  Diagnoses and all orders for this visit:  Essential hypertension- His blood pressure is adequately well controlled.  Electrolytes and renal function are normal. -     Basic metabolic panel; Future  Type II diabetes mellitus with manifestations ([place])- His A1c is at 7.0%.  His blood sugars are adequately well controlled.  Will continue the current regimen. -     Basic metabolic panel; Future -     Hemoglobin A1c; Future  Pure hypertriglyceridemia- His triglycerides remain elevated.  He has a shellfish allergy so I will not prescribe a fish oil supplement.  I have encouraged him to improve his lifestyle modifications. -     Triglycerides; Future  Vitamin D deficiency  disease   I am having Terry Harrington maintain his vitamin B-12, sildenafil, levothyroxine, pravastatin, Dulaglutide, empagliflozin, Potassium Chloride (K+ POTASSIUM PO), loratadine, Testosterone, testosterone, pioglitazone-metformin, Olmesartan-amLODIPine-HCTZ, and Vitamin D3.  No orders of the defined types were placed in this encounter.    Follow-up: Return in about 6 months (around 02/07/2019).  Scarlette Calico, MD

## 2018-08-07 NOTE — Patient Instructions (Signed)
Type 2 Diabetes Mellitus, Diagnosis, Adult Type 2 diabetes (type 2 diabetes mellitus) is a long-term (chronic) disease. In type 2 diabetes, one or both of these problems may be present:  The pancreas does not make enough of a hormone called insulin.  Cells in the body do not respond properly to insulin that the body makes (insulin resistance). Normally, insulin allows blood sugar (glucose) to enter cells in the body. The cells use glucose for energy. Insulin resistance or lack of insulin causes excess glucose to build up in the blood instead of going into cells. As a result, high blood glucose (hyperglycemia) develops. What increases the risk? The following factors may make you more likely to develop type 2 diabetes:  Having a family member with type 2 diabetes.  Being overweight or obese.  Having an inactive (sedentary) lifestyle.  Having been diagnosed with insulin resistance.  Having a history of prediabetes, gestational diabetes, or polycystic ovary syndrome (PCOS).  Being of American-Indian, African-American, Hispanic/Latino, or Asian/Pacific Islander descent. What are the signs or symptoms? In the early stage of this condition, you may not have symptoms. Symptoms develop slowly and may include:  Increased thirst (polydipsia).  Increased hunger(polyphagia).  Increased urination (polyuria).  Increased urination during the night (nocturia).  Unexplained weight loss.  Frequent infections that keep coming back (recurring).  Fatigue.  Weakness.  Vision changes, such as blurry vision.  Cuts or bruises that are slow to heal.  Tingling or numbness in the hands or feet.  Dark patches on the skin (acanthosis nigricans). How is this diagnosed? This condition is diagnosed based on your symptoms, your medical history, a physical exam, and your blood glucose level. Your blood glucose may be checked with one or more of the following blood tests:  A fasting blood glucose (FBG)  test. You will not be allowed to eat (you will fast) for 8 hours or longer before a blood sample is taken.  A random blood glucose test. This test checks blood glucose at any time of day regardless of when you ate.  An A1c (hemoglobin A1c) blood test. This test provides information about blood glucose control over the previous 2-3 months.  An oral glucose tolerance test (OGTT). This test measures your blood glucose at two times: ? After fasting. This is your baseline blood glucose level. ? Two hours after drinking a beverage that contains glucose. You may be diagnosed with type 2 diabetes if:  Your FBG level is 126 mg/dL (7.0 mmol/L) or higher.  Your random blood glucose level is 200 mg/dL (11.1 mmol/L) or higher.  Your A1c level is 6.5% or higher.  Your OGTT result is higher than 200 mg/dL (11.1 mmol/L). These blood tests may be repeated to confirm your diagnosis. How is this treated? Your treatment may be managed by a specialist called an endocrinologist. Type 2 diabetes may be treated by following instructions from your health care provider about:  Making diet and lifestyle changes. This may include: ? Following an individualized nutrition plan that is developed by a diet and nutrition specialist (registered dietitian). ? Exercising regularly. ? Finding ways to manage stress.  Checking your blood glucose level as often as told.  Taking diabetes medicines or insulin daily. This helps to keep your blood glucose levels in the healthy range. ? If you use insulin, you may need to adjust the dosage depending on how physically active you are and what foods you eat. Your health care provider will tell you how to adjust your dosage.    Taking medicines to help prevent complications from diabetes, such as: ? Aspirin. ? Medicine to lower cholesterol. ? Medicine to control blood pressure. Your health care provider will set individualized treatment goals for you. Your goals will be based on  your age, other medical conditions you have, and how you respond to diabetes treatment. Generally, the goal of treatment is to maintain the following blood glucose levels:  Before meals (preprandial): 80-130 mg/dL (4.4-7.2 mmol/L).  After meals (postprandial): below 180 mg/dL (10 mmol/L).  A1c level: less than 7%. Follow these instructions at home: Questions to ask your health care provider  Consider asking the following questions: ? Do I need to meet with a diabetes educator? ? Where can I find a support group for people with diabetes? ? What equipment will I need to manage my diabetes at home? ? What diabetes medicines do I need, and when should I take them? ? How often do I need to check my blood glucose? ? What number can I call if I have questions? ? When is my next appointment? General instructions  Take over-the-counter and prescription medicines only as told by your health care provider.  Keep all follow-up visits as told by your health care provider. This is important.  For more information about diabetes, visit: ? American Diabetes Association (ADA): www.diabetes.org ? American Association of Diabetes Educators (AADE): www.diabeteseducator.org Contact a health care provider if:  Your blood glucose is at or above 240 mg/dL (13.3 mmol/L) for 2 days in a row.  You have been sick or have had a fever for 2 days or longer, and you are not getting better.  You have any of the following problems for more than 6 hours: ? You cannot eat or drink. ? You have nausea and vomiting. ? You have diarrhea. Get help right away if:  Your blood glucose is lower than 54 mg/dL (3.0 mmol/L).  You become confused or you have trouble thinking clearly.  You have difficulty breathing.  You have moderate or large ketone levels in your urine. Summary  Type 2 diabetes (type 2 diabetes mellitus) is a long-term (chronic) disease. In type 2 diabetes, the pancreas does not make enough of a  hormone called insulin, or cells in the body do not respond properly to insulin that the body makes (insulin resistance).  This condition is treated by making diet and lifestyle changes and taking diabetes medicines or insulin.  Your health care provider will set individualized treatment goals for you. Your goals will be based on your age, other medical conditions you have, and how you respond to diabetes treatment.  Keep all follow-up visits as told by your health care provider. This is important. This information is not intended to replace advice given to you by your health care provider. Make sure you discuss any questions you have with your health care provider. Document Released: 12/26/2004 Document Revised: 02/23/2017 Document Reviewed: 01/29/2015 Elsevier Patient Education  2020 Elsevier Inc.  

## 2018-08-08 ENCOUNTER — Encounter: Payer: Self-pay | Admitting: Internal Medicine

## 2018-08-08 LAB — HEMOGLOBIN A1C: Hgb A1c MFr Bld: 7 % — ABNORMAL HIGH (ref 4.6–6.5)

## 2018-08-30 ENCOUNTER — Other Ambulatory Visit: Payer: Self-pay | Admitting: Internal Medicine

## 2018-09-03 ENCOUNTER — Other Ambulatory Visit: Payer: Self-pay | Admitting: Internal Medicine

## 2018-09-03 DIAGNOSIS — E039 Hypothyroidism, unspecified: Secondary | ICD-10-CM

## 2018-09-06 ENCOUNTER — Other Ambulatory Visit: Payer: Self-pay

## 2018-09-10 ENCOUNTER — Other Ambulatory Visit: Payer: Self-pay

## 2018-09-10 ENCOUNTER — Encounter: Payer: Self-pay | Admitting: Endocrinology

## 2018-09-10 ENCOUNTER — Ambulatory Visit: Payer: BC Managed Care – PPO | Admitting: Endocrinology

## 2018-09-10 VITALS — BP 144/62 | HR 78 | Ht 71.0 in | Wt 209.6 lb

## 2018-09-10 DIAGNOSIS — E118 Type 2 diabetes mellitus with unspecified complications: Secondary | ICD-10-CM | POA: Diagnosis not present

## 2018-09-10 DIAGNOSIS — E291 Testicular hypofunction: Secondary | ICD-10-CM | POA: Diagnosis not present

## 2018-09-10 NOTE — Progress Notes (Signed)
Subjective:    Patient ID: Terry Harrington, male    DOB: [birth year], 63 y.o.   MRN: [id number]  HPI Pt returns for f/u of diabetes mellitus:  DM type: 2 Dx'ed: 8250 Complications: renal insuff Therapy: trulicity and 3 oral meds.  DKA: never.  Severe hypoglycemia: never.  Pancreatitis: never Other: he took insulin 2015-2017; in early 2017, he was hospitalized for nonketotic hyperosmolar hyperglycemic state, due to missing insulin doses, he did not tolerate farxiga (arthralgias); he works M-F, day shift.  He says cbg's are higher on weekends.  Interval history: pt says cbg's are well-controlled.  Pt says he sometimes misses DM meds, due to nausea.  pt states he feels well in general.   Pt also has primary hypogonadism (dx'ed 2018; he has lifelong infertility, but karyotype was normal; he took androgel 2012-2014; testosterone level was low, but he is on max safe dosage (50 mg/d)).  He has ED is much better on this Past Medical History:  Diagnosis Date  . Diabetes mellitus   . GERD (gastroesophageal reflux disease)   . Heart murmur   . Hyperlipidemia   . Hypertension    on meds  . Hypothyroidism    on meds    Past Surgical History:  Procedure Laterality Date  . DENTAL SURGERY     several dental procedure approx 10 between 1999-2012  . HERNIA REPAIR     as a child  . SHOULDER ARTHROSCOPY  05/31/2011   Procedure: ARTHROSCOPY SHOULDER;  Surgeon: Sharmon Revere, MD;  Location: [facility];  Service: Orthopedics;  Laterality: Left;  Shoulder Acromioplasty Left  . SHOULDER SURGERY  05/31/2011    impingment left    Social History   Socioeconomic History  . Marital status: Divorced    Spouse name: Not on file  . Number of children: Not on file  . Years of education: Not on file  . Highest education level: Not on file  Occupational History  . Occupation: Barista  . Financial resource strain: Not on file  . Food insecurity    Worry: Not on file    Inability:  Not on file  . Transportation needs    Medical: Not on file    Non-medical: Not on file  Tobacco Use  . Smoking status: Former Smoker    Packs/day: 1.50    Years: 26.00    Pack years: 39.00    Types: Cigarettes    Quit date: 03/02/2008    Years since quitting: 10.5  . Smokeless tobacco: Former Systems developer    Quit date: 05/25/2008  Substance and Sexual Activity  . Alcohol use: Yes    Alcohol/week: 0.0 standard drinks    Comment: rarely  . Drug use: No  . Sexual activity: Not Currently  Lifestyle  . Physical activity    Days per week: Not on file    Minutes per session: Not on file  . Stress: Not on file  Relationships  . Social Herbalist on phone: Not on file    Gets together: Not on file    Attends religious service: Not on file    Active member of club or organization: Not on file    Attends meetings of clubs or organizations: Not on file    Relationship status: Not on file  . Intimate partner violence    Fear of current or ex partner: Not on file    Emotionally abused: Not on file    Physically abused:  Not on file    Forced sexual activity: Not on file  Other Topics Concern  . Not on file  Social History Narrative  . Not on file    Current Outpatient Medications on File Prior to Visit  Medication Sig Dispense Refill  . Cholecalciferol (VITAMIN D3) 1.25 MG (50000 UT) CAPS TAKE 1 CAPSULE BY MOUTH ONE TIME PER WEEK 4 capsule 0  . Dulaglutide (TRULICITY) 1.5 GB/1.5VV SOPN Inject 1.5 mg into the skin once a week. 12 pen 3  . empagliflozin (JARDIANCE) 25 MG TABS tablet Take 25 mg by mouth daily. 90 tablet 1  . levothyroxine (SYNTHROID, LEVOTHROID) 100 MCG tablet Take 1 tablet (100 mcg total) by mouth daily. 90 tablet 1  . loratadine (CLARITIN) 10 MG tablet Take 10 mg by mouth daily.    . Olmesartan-amLODIPine-HCTZ 40-10-12.5 MG TABS TAKE 1 TABLET BY MOUTH EVERY DAY 90 tablet 0  . pioglitazone-metformin (ACTOPLUS MET) 15-850 MG tablet TAKE 2 TABLETS EVERY DAY WITH  SUPPER 180 tablet 1  . Potassium Chloride (K+ POTASSIUM PO) Take by mouth.    . pravastatin (PRAVACHOL) 40 MG tablet Take 1 tablet (40 mg total) by mouth daily. 90 tablet 1  . sildenafil (VIAGRA) 100 MG tablet Take 1 tablet (100 mg total) by mouth daily as needed for erectile dysfunction. 30 tablet 5  . vitamin B-12 (CYANOCOBALAMIN) 1000 MCG tablet Take 2,000 mcg by mouth daily.     No current facility-administered medications on file prior to visit.     Allergies  Allergen Reactions  . Crab [Shellfish Allergy] Anaphylaxis  . Iodine Anaphylaxis  . Lisinopril     cough  . Crestor [Rosuvastatin]     Muscle aches  . Wilder Glade [Dapagliflozin] Other (See Comments)    Joint pain and weakness  . Iohexol Swelling  . Peanut-Containing Drug Products Itching    And whelps  . Aspirin Rash  . Codeine Rash  . Penicillins Rash    Has patient had a PCN reaction causing immediate rash, facial/tongue/throat swelling, SOB or lightheadedness with hypotension: Yes Has patient had a PCN reaction causing severe rash involving mucus membranes or skin necrosis: Yes Has patient had a PCN reaction that required hospitalization No Has patient had a PCN reaction occurring within the last 10 years: No If all of the above answers are "NO", then may proceed with Cephalosporin use.     Family History  Problem Relation Age of Onset  . Hyperlipidemia Mother   . Hypertension Mother   . Stroke Father   . Kidney disease Father   . Diabetes Father   . Cancer Maternal Grandfather   . Anesthesia problems Neg Hx   . Hypotension Neg Hx   . Malignant hyperthermia Neg Hx   . Pseudochol deficiency Neg Hx     BP (!) 144/62 (BP Location: Left Arm, Patient Position: Sitting, Cuff Size: Large)   Pulse 78   Ht '5\' 11"'$  (1.803 m)   Wt 209 lb 9.6 oz (95.1 kg)   SpO2 97%   BMI 29.23 kg/m    Review of Systems Denies decreased urinary stream    Objective:   Physical Exam VITAL SIGNS:  See vs page GENERAL: no  distress Pulses: dorsalis pedis intact bilat.   MSK: no deformity of the feet CV: no leg edema Skin:  no ulcer on the feet.  normal color and temp on the feet. Neuro: sensation is intact to touch on the feet.  Lab Results  Component Value Date  HGBA1C 7.0 (H) 08/07/2018   Lab Results  Component Value Date   PSA 0.71 02/06/2018   PSA 0.24 12/27/2016   PSA 0.09 (L) 02/21/2013   Lab Results  Component Value Date   WBC 5.8 02/06/2018   HGB 14.4 02/06/2018   HCT 43.2 02/06/2018   MCV 91.4 02/06/2018   PLT 293.0 02/06/2018   Lab Results  Component Value Date   TESTOSTERONE 221 (L) 09/10/2018      Assessment & Plan:  HTN: is noted today Type 2 DM: well-controlled.  Nausea: we discussed.  He wants to continue the same pioglitazone/metformin for now.  Hypogonadism: testosterone is low, but he is on max safe dosage.   Patient Instructions  Your blood pressure is high today.  Please see your primary care provider soon, to have it rechecked Blood tests are requested for you today.  We'll let you know about the results.  If you want, we can reduce the pioglitazone/metformin, and add another medication.   check your blood sugar once a day.  vary the time of day when you check, between before the 3 meals, and at bedtime.  also check if you have symptoms of your blood sugar being too high or too low.  please keep a record of the readings and bring it to your next appointment here (or you can bring the meter itself).  You can write it on any piece of paper.  please call us sooner if your blood sugar goes below 70, or if you have a lot of readings over 200. Please come back for a follow-up appointment in 3 months.

## 2018-09-10 NOTE — Patient Instructions (Addendum)
Your blood pressure is high today.  Please see your primary care provider soon, to have it rechecked Blood tests are requested for you today.  We'll let you know about the results.  If you want, we can reduce the pioglitazone/metformin, and add another medication.   check your blood sugar once a day.  vary the time of day when you check, between before the 3 meals, and at bedtime.  also check if you have symptoms of your blood sugar being too high or too low.  please keep a record of the readings and bring it to your next appointment here (or you can bring the meter itself).  You can write it on any piece of paper.  please call us sooner if your blood sugar goes below 70, or if you have a lot of readings over 200. Please come back for a follow-up appointment in 3 months.

## 2018-09-11 LAB — TESTOSTERONE,FREE AND TOTAL
Testosterone, Free: 6.2 pg/mL — ABNORMAL LOW (ref 6.6–18.1)
Testosterone: 221 ng/dL — ABNORMAL LOW (ref 264–916)

## 2018-09-13 ENCOUNTER — Other Ambulatory Visit: Payer: Self-pay | Admitting: Endocrinology

## 2018-09-13 NOTE — Telephone Encounter (Signed)
Please refill if appropriate

## 2018-09-17 LAB — HM DIABETES EYE EXAM

## 2018-10-09 ENCOUNTER — Other Ambulatory Visit: Payer: Self-pay | Admitting: Internal Medicine

## 2018-10-09 DIAGNOSIS — E785 Hyperlipidemia, unspecified: Secondary | ICD-10-CM

## 2018-10-29 ENCOUNTER — Other Ambulatory Visit: Payer: Self-pay | Admitting: Internal Medicine

## 2018-10-29 DIAGNOSIS — E039 Hypothyroidism, unspecified: Secondary | ICD-10-CM

## 2018-11-03 ENCOUNTER — Other Ambulatory Visit: Payer: Self-pay | Admitting: Internal Medicine

## 2018-11-03 DIAGNOSIS — I10 Essential (primary) hypertension: Secondary | ICD-10-CM

## 2018-11-03 DIAGNOSIS — E118 Type 2 diabetes mellitus with unspecified complications: Secondary | ICD-10-CM

## 2018-11-18 ENCOUNTER — Other Ambulatory Visit: Payer: Self-pay | Admitting: Internal Medicine

## 2018-11-18 DIAGNOSIS — E559 Vitamin D deficiency, unspecified: Secondary | ICD-10-CM

## 2018-11-24 ENCOUNTER — Other Ambulatory Visit: Payer: Self-pay | Admitting: Endocrinology

## 2018-11-25 ENCOUNTER — Other Ambulatory Visit: Payer: Self-pay | Admitting: Endocrinology

## 2018-11-25 MED ORDER — TESTOSTERONE 50 MG/5GM (1%) TD GEL: 5.0000 g | Freq: Every day | TRANSDERMAL | 5 refills | Status: DC

## 2018-11-25 NOTE — Telephone Encounter (Signed)
Please advise 

## 2018-11-25 NOTE — Telephone Encounter (Signed)
I refilled testosterone. Please refill trulicity PRN

## 2018-11-25 NOTE — Telephone Encounter (Signed)
Trulicity is already approved and has been sent. However, I am not able to close this message. Stating a Rx has not been filled. The only one not having a response is the Testosterone

## 2018-12-11 ENCOUNTER — Other Ambulatory Visit: Payer: Self-pay

## 2018-12-13 ENCOUNTER — Ambulatory Visit: Payer: BC Managed Care – PPO | Admitting: Endocrinology

## 2018-12-13 ENCOUNTER — Other Ambulatory Visit: Payer: Self-pay

## 2018-12-13 ENCOUNTER — Encounter: Payer: Self-pay | Admitting: Endocrinology

## 2018-12-13 VITALS — BP 100/52 | HR 76 | Ht 71.0 in | Wt 209.4 lb

## 2018-12-13 DIAGNOSIS — E118 Type 2 diabetes mellitus with unspecified complications: Secondary | ICD-10-CM | POA: Diagnosis not present

## 2018-12-13 DIAGNOSIS — R110 Nausea: Secondary | ICD-10-CM | POA: Diagnosis not present

## 2018-12-13 DIAGNOSIS — I10 Essential (primary) hypertension: Secondary | ICD-10-CM | POA: Diagnosis not present

## 2018-12-13 LAB — POCT GLYCOSYLATED HEMOGLOBIN (HGB A1C): Hemoglobin A1C: 6.7 % — AB (ref 4.0–5.6)

## 2018-12-13 MED ORDER — PIOGLITAZONE HCL 45 MG PO TABS: 45.0000 mg | ORAL_TABLET | Freq: Every day | ORAL | 3 refills | Status: DC

## 2018-12-13 MED ORDER — METFORMIN HCL 500 MG PO TABS: 500.0000 mg | ORAL_TABLET | Freq: Two times a day (BID) | ORAL | 3 refills | Status: DC

## 2018-12-13 NOTE — Patient Instructions (Addendum)
I have sent a prescription to your pharmacy, to split the pioglitazone and metformin into 2 different prescriptions.  This way, we can increase the pioglitazone, and decrease the metformin.   Please continue the same other diabetes medications.   Please have an appointment with Dr Ronnald Ramp about your blood pressure. check your blood sugar once a day.  vary the time of day when you check, between before the 3 meals, and at bedtime.  also check if you have symptoms of your blood sugar being too high or too low.  please keep a record of the readings and bring it to your next appointment here (or you can bring the meter itself).  You can write it on any piece of paper.  please call us sooner if your blood sugar goes below 70, or if you have a lot of readings over 200. Please come back for a follow-up appointment in 3 months.

## 2018-12-13 NOTE — Progress Notes (Signed)
Subjective:    Patient ID: Terry Harrington, male    DOB: 04-03-1955, 63 y.o.   MRN: [id number]  HPI Pt returns for f/u of diabetes mellitus:  DM type: 2 Dx'ed: 2010 Complications: renal insuff Therapy: trulicity and 3 oral meds.  DKA: never.  Severe hypoglycemia: never.  Pancreatitis: never Other: he took insulin 2015-2017; in early 2017, he was hospitalized for nonketotic hyperosmolar hyperglycemic state, due to missing insulin doses, he did not tolerate farxiga (arthralgias); he works M-F, day shift.  He says cbg's are higher on weekends.  Interval history: nausea persists  He brings a record of his cbg's which I have reviewed today.  cbg varies from 91-195.  There is no trend throughout the day.   Pt also has primary hypogonadism (dx'ed 2018; he has lifelong infertility, but karyotype was normal; he took androgel 2012-2014; testosterone level was low, but he is on max safe dosage (50 mg/d)).  Past Medical History:  Diagnosis Date  . Diabetes mellitus   . GERD (gastroesophageal reflux disease)   . Heart murmur   . Hyperlipidemia   . Hypertension    on meds  . Hypothyroidism    on meds    Past Surgical History:  Procedure Laterality Date  . DENTAL SURGERY     several dental procedure approx 10 between 1999-2012  . HERNIA REPAIR     as a child  . SHOULDER ARTHROSCOPY  05/31/2011   Procedure: ARTHROSCOPY SHOULDER;  Surgeon: Kennieth Rad, MD;  Location: [facility] OR;  Service: Orthopedics;  Laterality: Left;  Shoulder Acromioplasty Left  . SHOULDER SURGERY  05/31/2011    impingment left    Social History   Socioeconomic History  . Marital status: Divorced    Spouse name: Not on file  . Number of children: Not on file  . Years of education: Not on file  . Highest education level: Not on file  Occupational History  . Occupation: Training and development officer  . Financial resource strain: Not on file  . Food insecurity    Worry: Not on file    Inability: Not on file  .  Transportation needs    Medical: Not on file    Non-medical: Not on file  Tobacco Use  . Smoking status: Former Smoker    Packs/day: 1.50    Years: 26.00    Pack years: 39.00    Types: Cigarettes    Quit date: 03/02/2008    Years since quitting: 10.7  . Smokeless tobacco: Former Neurosurgeon    Quit date: 05/25/2008  Substance and Sexual Activity  . Alcohol use: Yes    Alcohol/week: 0.0 standard drinks    Comment: rarely  . Drug use: No  . Sexual activity: Not Currently  Lifestyle  . Physical activity    Days per week: Not on file    Minutes per session: Not on file  . Stress: Not on file  Relationships  . Social Musician on phone: Not on file    Gets together: Not on file    Attends religious service: Not on file    Active member of club or organization: Not on file    Attends meetings of clubs or organizations: Not on file    Relationship status: Not on file  . Intimate partner violence    Fear of current or ex partner: Not on file    Emotionally abused: Not on file    Physically abused: Not on file  Forced sexual activity: Not on file  Other Topics Concern  . Not on file  Social History Narrative  . Not on file    Current Outpatient Medications on File Prior to Visit  Medication Sig Dispense Refill  . Cholecalciferol (VITAMIN D3) 1.25 MG (50000 UT) CAPS TAKE 1 CAPSULE BY MOUTH ONE TIME PER WEEK 4 capsule 0  . empagliflozin (JARDIANCE) 25 MG TABS tablet Take 25 mg by mouth daily. 90 tablet 1  . levothyroxine (SYNTHROID) 100 MCG tablet Take 1 tablet (100 mcg total) by mouth daily. 90 tablet 1  . loratadine (CLARITIN) 10 MG tablet Take 10 mg by mouth daily.    . Olmesartan-amLODIPine-HCTZ 40-10-12.5 MG TABS TAKE 1 TABLET BY MOUTH EVERY DAY 90 tablet 0  . Potassium Chloride (K+ POTASSIUM PO) Take by mouth.    . pravastatin (PRAVACHOL) 40 MG tablet TAKE 1 TABLET BY MOUTH EVERY DAY 90 tablet 1  . sildenafil (VIAGRA) 100 MG tablet Take 1 tablet (100 mg total) by  mouth daily as needed for erectile dysfunction. 30 tablet 5  . testosterone (ANDROGEL) 50 MG/5GM (1%) GEL Place 5 g onto the skin daily. 150 g 5  . TRULICITY 1.5 IR/5.1OA SOPN INJECT 1.5 MG INTO THE SKIN ONCE A WEEK. 4 pen 2  . vitamin B-12 (CYANOCOBALAMIN) 1000 MCG tablet Take 2,000 mcg by mouth daily.     No current facility-administered medications on file prior to visit.     Allergies  Allergen Reactions  . Crab [Shellfish Allergy] Anaphylaxis  . Iodine Anaphylaxis  . Lisinopril     cough  . Crestor [Rosuvastatin]     Muscle aches  . Wilder Glade [Dapagliflozin] Other (See Comments)    Joint pain and weakness  . Iohexol Swelling  . Peanut-Containing Drug Products Itching    And whelps  . Aspirin Rash  . Codeine Rash  . Penicillins Rash    Has patient had a PCN reaction causing immediate rash, facial/tongue/throat swelling, SOB or lightheadedness with hypotension: Yes Has patient had a PCN reaction causing severe rash involving mucus membranes or skin necrosis: Yes Has patient had a PCN reaction that required hospitalization No Has patient had a PCN reaction occurring within the last 10 years: No If all of the above answers are "NO", then may proceed with Cephalosporin use.     Family History  Problem Relation Age of Onset  . Hyperlipidemia Mother   . Hypertension Mother   . Stroke Father   . Kidney disease Father   . Diabetes Father   . Cancer Maternal Grandfather   . Anesthesia problems Neg Hx   . Hypotension Neg Hx   . Malignant hyperthermia Neg Hx   . Pseudochol deficiency Neg Hx     BP (!) 100/52 (BP Location: Left Arm, Patient Position: Sitting, Cuff Size: Large)   Pulse 76   Ht 5\' 11"  (1.803 m)   Wt 209 lb 6.4 oz (95 kg)   SpO2 96%   BMI 29.21 kg/m    Review of Systems He denies hypoglycemia. He has slight lightheadedness    Objective:   Physical Exam VITAL SIGNS:  See vs page GENERAL: no distress Pulses: dorsalis pedis intact bilat.   MSK: no  deformity of the feet CV: no leg edema Skin:  no ulcer on the feet.  normal color and temp on the feet. Neuro: sensation is intact to touch on the feet  Lab Results  Component Value Date   HGBA1C 6.7 (A) 12/13/2018   Lab  Results  Component Value Date   WBC 5.8 02/06/2018   HGB 14.4 02/06/2018   HCT 43.2 02/06/2018   MCV 91.4 02/06/2018   PLT 293.0 02/06/2018        Assessment & Plan:  Type 2 DM: well-controlled Nausea, persistent.  We discussed options. He chooses to reduce metformin.   HTN: is noted today  Patient Instructions  I have sent a prescription to your pharmacy, to split the pioglitazone and metformin into 2 different prescriptions.  This way, we can increase the pioglitazone, and decrease the metformin.   Please continue the same other diabetes medications.   Please have an appointment with Dr Yetta BarreJones about your blood pressure. check your blood sugar once a day.  vary the time of day when you check, between before the 3 meals, and at bedtime.  also check if you have symptoms of your blood sugar being too high or too low.  please keep a record of the readings and bring it to your next appointment here (or you can bring the meter itself).  You can write it on any piece of paper.  please call us sooner if your blood sugar goes below 70, or if you have a lot of readings over 200. Please come back for a follow-up appointment in 3 months.

## 2018-12-20 ENCOUNTER — Other Ambulatory Visit: Payer: Self-pay | Admitting: Internal Medicine

## 2018-12-20 DIAGNOSIS — E559 Vitamin D deficiency, unspecified: Secondary | ICD-10-CM

## 2018-12-23 MED ORDER — VITAMIN D3 1.25 MG (50000 UT) PO CAPS: 1.0000 | ORAL_CAPSULE | Freq: Every day | ORAL | 0 refills | Status: DC

## 2018-12-27 ENCOUNTER — Other Ambulatory Visit: Payer: Self-pay | Admitting: Internal Medicine

## 2018-12-27 DIAGNOSIS — E118 Type 2 diabetes mellitus with unspecified complications: Secondary | ICD-10-CM

## 2019-02-10 ENCOUNTER — Encounter: Payer: Self-pay | Admitting: Internal Medicine

## 2019-02-10 ENCOUNTER — Ambulatory Visit (INDEPENDENT_AMBULATORY_CARE_PROVIDER_SITE_OTHER): Payer: BC Managed Care – PPO | Admitting: Internal Medicine

## 2019-02-10 ENCOUNTER — Other Ambulatory Visit: Payer: Self-pay

## 2019-02-10 VITALS — BP 142/68 | HR 88 | Temp 98.4°F | Resp 16 | Ht 71.0 in | Wt 214.0 lb

## 2019-02-10 DIAGNOSIS — R0609 Other forms of dyspnea: Secondary | ICD-10-CM

## 2019-02-10 DIAGNOSIS — Z125 Encounter for screening for malignant neoplasm of prostate: Secondary | ICD-10-CM

## 2019-02-10 DIAGNOSIS — E559 Vitamin D deficiency, unspecified: Secondary | ICD-10-CM | POA: Diagnosis not present

## 2019-02-10 DIAGNOSIS — R0600 Dyspnea, unspecified: Secondary | ICD-10-CM | POA: Diagnosis not present

## 2019-02-10 DIAGNOSIS — E781 Pure hyperglyceridemia: Secondary | ICD-10-CM | POA: Diagnosis not present

## 2019-02-10 DIAGNOSIS — I10 Essential (primary) hypertension: Secondary | ICD-10-CM

## 2019-02-10 DIAGNOSIS — Z0000 Encounter for general adult medical examination without abnormal findings: Secondary | ICD-10-CM | POA: Diagnosis not present

## 2019-02-10 DIAGNOSIS — E039 Hypothyroidism, unspecified: Secondary | ICD-10-CM

## 2019-02-10 DIAGNOSIS — E785 Hyperlipidemia, unspecified: Secondary | ICD-10-CM | POA: Diagnosis not present

## 2019-02-10 DIAGNOSIS — K439 Ventral hernia without obstruction or gangrene: Secondary | ICD-10-CM | POA: Insufficient documentation

## 2019-02-10 DIAGNOSIS — E118 Type 2 diabetes mellitus with unspecified complications: Secondary | ICD-10-CM

## 2019-02-10 LAB — BASIC METABOLIC PANEL
BUN: 28 mg/dL — ABNORMAL HIGH (ref 6–23)
CO2: 27 mEq/L (ref 19–32)
Calcium: 10.3 mg/dL (ref 8.4–10.5)
Chloride: 99 mEq/L (ref 96–112)
Creatinine, Ser: 1.22 mg/dL (ref 0.40–1.50)
GFR: 72.06 mL/min (ref >60.00)
Glucose, Bld: 101 mg/dL — ABNORMAL HIGH (ref 70–99)
Potassium: 4.5 mEq/L (ref 3.5–5.1)
Sodium: 136 mEq/L (ref 135–145)

## 2019-02-10 LAB — LIPID PANEL
Cholesterol: 186 mg/dL (ref 0–200)
HDL: 58.4 mg/dL (ref >39.00)
NonHDL: 127.45
Total CHOL/HDL Ratio: 3
Triglycerides: 304 mg/dL — ABNORMAL HIGH (ref 0.0–149.0)
VLDL: 60.8 mg/dL — ABNORMAL HIGH (ref 0.0–40.0)

## 2019-02-10 LAB — TSH: TSH: 1.6 u[IU]/mL (ref 0.35–4.50)

## 2019-02-10 LAB — CBC WITH DIFFERENTIAL/PLATELET
Basophils Absolute: 0 10*3/uL (ref 0.0–0.1)
Basophils Relative: 0.2 % (ref 0.0–3.0)
Eosinophils Absolute: 0 10*3/uL (ref 0.0–0.7)
Eosinophils Relative: 0.5 % (ref 0.0–5.0)
HCT: 43.2 % (ref 39.0–52.0)
Hemoglobin: 14.5 g/dL (ref 13.0–17.0)
Lymphocytes Relative: 40.2 % (ref 12.0–46.0)
Lymphs Abs: 2.2 10*3/uL (ref 0.7–4.0)
MCHC: 33.6 g/dL (ref 30.0–36.0)
MCV: 90.9 fl (ref 78.0–100.0)
Monocytes Absolute: 0.4 10*3/uL (ref 0.1–1.0)
Monocytes Relative: 6.8 % (ref 3.0–12.0)
Neutro Abs: 2.8 10*3/uL (ref 1.4–7.7)
Neutrophils Relative %: 52.3 % (ref 43.0–77.0)
Platelets: 285 10*3/uL (ref 150.0–400.0)
RBC: 4.74 Mil/uL (ref 4.22–5.81)
RDW: 14.6 % (ref 11.5–15.5)
WBC: 5.4 10*3/uL (ref 4.0–10.5)

## 2019-02-10 LAB — HEPATIC FUNCTION PANEL
ALT: 12 U/L (ref 0–53)
AST: 11 U/L (ref 0–37)
Albumin: 4.5 g/dL (ref 3.5–5.2)
Alkaline Phosphatase: 75 U/L (ref 39–117)
Bilirubin, Direct: 0.1 mg/dL (ref 0.0–0.3)
Total Bilirubin: 0.3 mg/dL (ref 0.2–1.2)
Total Protein: 7.3 g/dL (ref 6.0–8.3)

## 2019-02-10 LAB — BRAIN NATRIURETIC PEPTIDE: Pro B Natriuretic peptide (BNP): 17 pg/mL (ref 0.0–100.0)

## 2019-02-10 LAB — LDL CHOLESTEROL, DIRECT: Direct LDL: 86 mg/dL

## 2019-02-10 LAB — PSA: PSA: 0.46 ng/mL (ref 0.10–4.00)

## 2019-02-10 LAB — TROPONIN I (HIGH SENSITIVITY): High Sens Troponin I: 3 ng/L (ref 2–17)

## 2019-02-10 NOTE — Progress Notes (Signed)
Subjective:  Patient ID: Terry Harrington, male    DOB: Apr 07, 1955  Age: 64 y.o. MRN: [id number]  CC: Annual Exam, Hypertension, Hyperlipidemia, Hypothyroidism, and Diabetes  This visit occurred during the SARS-CoV-2 public health emergency.  Safety protocols were in place, including screening questions prior to the visit, additional usage of staff PPE, and extensive cleaning of exam room while observing appropriate contact time as indicated for disinfecting solutions.    HPI Terry Harrington presents for a CPX.  Over the last few months he has developed dyspnea on exertion.  He says that he can ride a stationary bicycle for about 3 minutes but then he gets short of breath and has to stop.  He experiences DOE with walking as well.  He denies chest pain, diaphoresis, dizziness, or lightheadedness.  He admits he has not been working on his lifestyle modifications and has gained weight.  Outpatient Medications Prior to Visit  Medication Sig Dispense Refill  . Cholecalciferol (VITAMIN D3) 1.25 MG (50000 UT) CAPS Take 1 capsule by mouth daily. 4 capsule 0  . JARDIANCE 25 MG TABS tablet TAKE 1 TABLET BY MOUTH DAILY 90 tablet 1  . levothyroxine (SYNTHROID) 100 MCG tablet Take 1 tablet (100 mcg total) by mouth daily. 90 tablet 1  . loratadine (CLARITIN) 10 MG tablet Take 10 mg by mouth daily.    . metFORMIN (GLUCOPHAGE) 500 MG tablet Take 1 tablet (500 mg total) by mouth 2 (two) times daily with a meal. 180 tablet 3  . Olmesartan-amLODIPine-HCTZ 40-10-12.5 MG TABS TAKE 1 TABLET BY MOUTH EVERY DAY 90 tablet 0  . pioglitazone (ACTOS) 45 MG tablet Take 1 tablet (45 mg total) by mouth daily. 90 tablet 3  . Potassium Chloride (K+ POTASSIUM PO) Take by mouth.    . pravastatin (PRAVACHOL) 40 MG tablet TAKE 1 TABLET BY MOUTH EVERY DAY 90 tablet 1  . sildenafil (VIAGRA) 100 MG tablet Take 1 tablet (100 mg total) by mouth daily as needed for erectile dysfunction. 30 tablet 5  . testosterone (ANDROGEL) 50  MG/5GM (1%) GEL Place 5 g onto the skin daily. 150 g 5  . TRULICITY 1.5 IZ/1.2WP SOPN INJECT 1.5 MG INTO THE SKIN ONCE A WEEK. 4 pen 2  . vitamin B-12 (CYANOCOBALAMIN) 1000 MCG tablet Take 2,000 mcg by mouth daily.     No facility-administered medications prior to visit.    ROS Review of Systems  Constitutional: Positive for fatigue and unexpected weight change. Negative for diaphoresis.  HENT: Negative.   Eyes: Negative for visual disturbance.  Respiratory: Negative for cough, chest tightness, shortness of breath and wheezing.   Cardiovascular: Negative for chest pain, palpitations and leg swelling.  Gastrointestinal: Negative for abdominal pain, blood in stool, constipation, diarrhea, nausea and vomiting.       He complains that the abd hernia is getting larger and he wants to know if it can be surgically repaired.  Endocrine: Negative.   Genitourinary: Negative.  Negative for difficulty urinating, discharge, scrotal swelling, testicular pain and urgency.  Musculoskeletal: Negative.  Negative for arthralgias.  Skin: Negative.  Negative for color change and pallor.  Neurological: Negative.  Negative for dizziness, weakness, light-headedness and headaches.  Hematological: Negative for adenopathy. Does not bruise/bleed easily.  Psychiatric/Behavioral: Negative.     Objective:  BP (!) 142/68   Pulse 88   Temp 98.4 F (36.9 C) (Oral)   Resp 16   Ht 5\' 11"  (1.803 m)   Wt 214 lb (97.1 kg)  SpO2 96%   BMI 29.85 kg/m   BP Readings from Last 3 Encounters:  02/10/19 (!) 142/68  12/13/18 (!) 100/52  09/10/18 (!) 144/62    Wt Readings from Last 3 Encounters:  02/10/19 214 lb (97.1 kg)  12/13/18 209 lb 6.4 oz (95 kg)  09/10/18 209 lb 9.6 oz (95.1 kg)    Physical Exam Vitals reviewed.  Constitutional:      Appearance: Normal appearance.  HENT:     Nose: Nose normal.     Mouth/Throat:     Mouth: Mucous membranes are moist.  Eyes:     General: No scleral icterus.     Conjunctiva/sclera: Conjunctivae normal.  Cardiovascular:     Rate and Rhythm: Normal rate and regular rhythm.     Heart sounds: No murmur. No gallop.      Comments: EKG-   Sinus rhythm with PAC's, 75 bpm New conduction delay, very subtle No LVH, Q waves, or ST/T wave changes Pulmonary:     Effort: Pulmonary effort is normal.     Breath sounds: No stridor. No wheezing, rhonchi or rales.  Abdominal:     General: Abdomen is protuberant. Bowel sounds are normal. There is no distension.     Palpations: Abdomen is soft. There is no hepatomegaly, splenomegaly or mass.     Tenderness: There is no abdominal tenderness. There is no guarding.     Hernia: A hernia is present. Hernia is present in the ventral area.  Genitourinary:    Pubic Area: No rash.      Penis: Normal. No discharge, swelling or lesions.      Testes: Normal.        Right: Mass or tenderness not present.        Left: Mass or tenderness not present.     Epididymis:     Right: Normal. Not inflamed or enlarged.     Left: Normal. Not inflamed or enlarged.     Prostate: Normal. Not enlarged, not tender and no nodules present.     Rectum: Normal. Guaiac result negative. No mass, tenderness, anal fissure, external hemorrhoid or internal hemorrhoid. Normal anal tone.  Musculoskeletal:        General: Normal range of motion.     Cervical back: Neck supple.     Right lower leg: No edema.     Left lower leg: No edema.  Lymphadenopathy:     Cervical: No cervical adenopathy.  Skin:    General: Skin is warm and dry.     Coloration: Skin is not pale.  Neurological:     General: No focal deficit present.     Mental Status: He is alert and oriented to person, place, and time. Mental status is at baseline.  Psychiatric:        Mood and Affect: Mood normal.        Behavior: Behavior normal.     Lab Results  Component Value Date   WBC 5.4 02/10/2019   HGB 14.5 02/10/2019   HCT 43.2 02/10/2019   PLT 285.0 02/10/2019   GLUCOSE  101 (H) 02/10/2019   CHOL 186 02/10/2019   TRIG 304.0 (H) 02/10/2019   HDL 58.40 02/10/2019   LDLDIRECT 86.0 02/10/2019   LDLCALC 77 08/21/2016   ALT 12 02/10/2019   AST 11 02/10/2019   NA 136 02/10/2019   K 4.5 02/10/2019   CL 99 02/10/2019   CREATININE 1.22 02/10/2019   BUN 28 (H) 02/10/2019   CO2 27 02/10/2019  TSH 1.60 02/10/2019   PSA 0.46 02/10/2019   HGBA1C 6.7 (A) 12/13/2018   MICROALBUR <0.7 02/10/2019    No results found.  Assessment & Plan:   Guerry was seen today for annual exam, hypertension, hyperlipidemia, hypothyroidism and diabetes.  Diagnoses and all orders for this visit:  Essential hypertension- His systolic blood pressure is not quite adequately well controlled.  I have asked him to continue the current medications and to improve his lifestyle modifications. -     CBC with Differential/Platelet -     Basic metabolic panel -     Urinalysis, Routine w reflex microscopic  Vitamin D deficiency disease- I recommended that he continue the current vitamin D supplement.  Pure hypertriglyceridemia- His triglycerides are modestly elevated.  He is allergic to shellfish so will avoid fish oil supplements.  I recommended that in addition to lifestyle modifications he start taking fenofibrate. -     Cancel: Lipid panel -     Hepatic function panel -     fenofibrate (TRICOR) 48 MG tablet; Take 1 tablet (48 mg total) by mouth daily.  Hyperlipidemia with target LDL less than 100- He has achieved his LDL goal and is doing well on the statin. -     Cancel: Lipid panel -     Hepatic function panel  Hypothyroidism (acquired)- His TSH is in the normal range.  He will remain on the current dose of levothyroxine. -     TSH  Type II diabetes mellitus with manifestations (HCC)- He has achieved adequate glycemic control. -     Microalbumin / creatinine urine ratio  Routine general medical examination at a health care facility- Exam completed, labs reviewed, vaccines  reviewed, colon cancer screening is up-to-date, patient education was given. -     Lipid panel -     PSA  DOE (dyspnea on exertion)- He had a normal myocardial perfusion imaging 4-1/2 years ago.  His EKG is negative for ischemia.  His BNP and troponin are normal.  I think this is deconditioning and weight gain.  I have asked him to improve his lifestyle modifications. -     Brain natriuretic peptide -     Troponin I (High Sensitivity) -     EKG 12-Lead  Ventral hernia without obstruction or gangrene -     Ambulatory referral to General Surgery  Other orders -     Cancel: PSA -     LDL cholesterol, direct   I am having [name] start on fenofibrate. I am also having him maintain his vitamin B-12, sildenafil, Potassium Chloride (K+ POTASSIUM PO), loratadine, pravastatin, levothyroxine, Olmesartan-amLODIPine-HCTZ, Trulicity, testosterone, pioglitazone, metFORMIN, Vitamin D3, and Jardiance.  Meds ordered this encounter  Medications  . fenofibrate (TRICOR) 48 MG tablet    Sig: Take 1 tablet (48 mg total) by mouth daily.    Dispense:  90 tablet    Refill:  1     Follow-up: Return in about 3 months (around 05/10/2019).  Sanda Linger, MD

## 2019-02-10 NOTE — Patient Instructions (Signed)

## 2019-02-11 ENCOUNTER — Encounter: Payer: Self-pay | Admitting: Internal Medicine

## 2019-02-11 LAB — URINALYSIS, ROUTINE W REFLEX MICROSCOPIC
Bilirubin Urine: NEGATIVE
Hgb urine dipstick: NEGATIVE
Ketones, ur: NEGATIVE
Leukocytes,Ua: NEGATIVE
Nitrite: NEGATIVE
RBC / HPF: NONE SEEN (ref 0–?)
Specific Gravity, Urine: 1.025 (ref 1.000–1.030)
Total Protein, Urine: NEGATIVE
Urine Glucose: >=1000 — AB
Urobilinogen, UA: 0.2 (ref 0.0–1.0)
pH: 5 (ref 5.0–8.0)

## 2019-02-11 LAB — MICROALBUMIN / CREATININE URINE RATIO
Creatinine,U: 156.9 mg/dL
Microalb Creat Ratio: 0.4 mg/g (ref 0.0–30.0)
Microalb, Ur: <0.7 mg/dL (ref 0.0–1.9)

## 2019-02-11 MED ORDER — FENOFIBRATE 48 MG PO TABS: 48.0000 mg | ORAL_TABLET | Freq: Every day | ORAL | 1 refills | Status: DC

## 2019-03-07 ENCOUNTER — Other Ambulatory Visit: Payer: Self-pay

## 2019-03-07 DIAGNOSIS — E118 Type 2 diabetes mellitus with unspecified complications: Secondary | ICD-10-CM

## 2019-03-07 DIAGNOSIS — I10 Essential (primary) hypertension: Secondary | ICD-10-CM

## 2019-03-07 MED ORDER — OLMESARTAN-AMLODIPINE-HCTZ 40-10-12.5 MG PO TABS: 1.0000 | ORAL_TABLET | Freq: Every day | ORAL | 0 refills | Status: DC

## 2019-03-09 ENCOUNTER — Encounter: Payer: Self-pay | Admitting: Internal Medicine

## 2019-03-14 ENCOUNTER — Other Ambulatory Visit: Payer: Self-pay

## 2019-03-14 ENCOUNTER — Encounter: Payer: Self-pay | Admitting: Endocrinology

## 2019-03-14 ENCOUNTER — Ambulatory Visit (INDEPENDENT_AMBULATORY_CARE_PROVIDER_SITE_OTHER): Payer: BC Managed Care – PPO | Admitting: Endocrinology

## 2019-03-14 VITALS — BP 110/64 | HR 80 | Ht 71.0 in | Wt 214.4 lb

## 2019-03-14 DIAGNOSIS — E118 Type 2 diabetes mellitus with unspecified complications: Secondary | ICD-10-CM | POA: Diagnosis not present

## 2019-03-14 LAB — POCT GLYCOSYLATED HEMOGLOBIN (HGB A1C): Hemoglobin A1C: 6.8 % — AB (ref 4.0–5.6)

## 2019-03-14 MED ORDER — TRULICITY 3 MG/0.5ML ~~LOC~~ SOAJ: 3.0000 mg | SUBCUTANEOUS | 3 refills | Status: DC

## 2019-03-14 NOTE — Progress Notes (Signed)
Subjective:    Patient ID: Terry Harrington, male    DOB: [birth year], 64 y.o.   MRN: [id number]  HPI Pt returns for f/u of diabetes mellitus:  DM type: 2 Dx'ed: 2010 Complications: renal insuff Therapy: trulicity and 3 oral meds.  DKA: never.  Severe hypoglycemia: never.  Pancreatitis: never Other: he took insulin 2015-2017; in early 2017, he was hospitalized for nonketotic hyperosmolar hyperglycemic state, due to missing insulin doses, he did not tolerate farxiga (arthralgias); he works M-F, day shift.  He says cbg's are higher on weekends.  Interval history: nausea persists  He brings a record of his cbg's which I have reviewed today.  cbg varies from 91-195.  There is no trend throughout the day.   Pt also has primary hypogonadism (dx'ed 2018; he has lifelong infertility, but karyotype was normal; he took androgel 2012-2014; testosterone level was low, but he is on max safe dosage (50 mg/d)).  Past Medical History:  Diagnosis Date  . Diabetes mellitus   . GERD (gastroesophageal reflux disease)   . Heart murmur   . Hyperlipidemia   . Hypertension    on meds  . Hypothyroidism    on meds    Past Surgical History:  Procedure Laterality Date  . DENTAL SURGERY     several dental procedure approx 10 between 1999-2012  . HERNIA REPAIR     as a child  . SHOULDER ARTHROSCOPY  05/31/2011   Procedure: ARTHROSCOPY SHOULDER;  Surgeon: Kennieth Rad, MD;  Location: [facility] OR;  Service: Orthopedics;  Laterality: Left;  Shoulder Acromioplasty Left  . SHOULDER SURGERY  05/31/2011    impingment left    Social History   Socioeconomic History  . Marital status: Divorced    Spouse name: Not on file  . Number of children: Not on file  . Years of education: Not on file  . Highest education level: Not on file  Occupational History  . Occupation: Holiday representative  Tobacco Use  . Smoking status: Former Smoker    Packs/day: 1.50    Years: 26.00    Pack years: 39.00    Types: Cigarettes     Quit date: 03/02/2008    Years since quitting: 11.0  . Smokeless tobacco: Former Neurosurgeon    Quit date: 05/25/2008  Substance and Sexual Activity  . Alcohol use: Yes    Alcohol/week: 0.0 standard drinks    Comment: rarely  . Drug use: No  . Sexual activity: Not Currently  Other Topics Concern  . Not on file  Social History Narrative  . Not on file   Social Determinants of Health   Financial Resource Strain:   . Difficulty of Paying Living Expenses: Not on file  Food Insecurity:   . Worried About Programme researcher, broadcasting/film/video in the Last Year: Not on file  . Ran Out of Food in the Last Year: Not on file  Transportation Needs:   . Lack of Transportation (Medical): Not on file  . Lack of Transportation (Non-Medical): Not on file  Physical Activity:   . Days of Exercise per Week: Not on file  . Minutes of Exercise per Session: Not on file  Stress:   . Feeling of Stress : Not on file  Social Connections:   . Frequency of Communication with Friends and Family: Not on file  . Frequency of Social Gatherings with Friends and Family: Not on file  . Attends Religious Services: Not on file  . Active Member of Clubs or Organizations:  Not on file  . Attends Archivist Meetings: Not on file  . Marital Status: Not on file  Intimate Partner Violence:   . Fear of Current or Ex-Partner: Not on file  . Emotionally Abused: Not on file  . Physically Abused: Not on file  . Sexually Abused: Not on file    Current Outpatient Medications on File Prior to Visit  Medication Sig Dispense Refill  . Cholecalciferol (VITAMIN D3) 1.25 MG (50000 UT) CAPS Take 1 capsule by mouth daily. 4 capsule 0  . fenofibrate (TRICOR) 48 MG tablet Take 1 tablet (48 mg total) by mouth daily. 90 tablet 1  . JARDIANCE 25 MG TABS tablet TAKE 1 TABLET BY MOUTH DAILY 90 tablet 1  . levothyroxine (SYNTHROID) 100 MCG tablet Take 1 tablet (100 mcg total) by mouth daily. 90 tablet 1  . loratadine (CLARITIN) 10 MG tablet Take 10 mg  by mouth daily.    . metFORMIN (GLUCOPHAGE) 500 MG tablet Take 1 tablet (500 mg total) by mouth 2 (two) times daily with a meal. 180 tablet 3  . Olmesartan-amLODIPine-HCTZ 40-10-12.5 MG TABS Take 1 tablet by mouth daily. 90 tablet 0  . pioglitazone (ACTOS) 45 MG tablet Take 1 tablet (45 mg total) by mouth daily. 90 tablet 3  . Potassium Chloride (K+ POTASSIUM PO) Take by mouth.    . pravastatin (PRAVACHOL) 40 MG tablet TAKE 1 TABLET BY MOUTH EVERY DAY 90 tablet 1  . sildenafil (VIAGRA) 100 MG tablet Take 1 tablet (100 mg total) by mouth daily as needed for erectile dysfunction. 30 tablet 5  . testosterone (ANDROGEL) 50 MG/5GM (1%) GEL Place 5 g onto the skin daily. 150 g 5  . vitamin B-12 (CYANOCOBALAMIN) 1000 MCG tablet Take 2,000 mcg by mouth daily.     No current facility-administered medications on file prior to visit.    Allergies  Allergen Reactions  . Crab [Shellfish Allergy] Anaphylaxis  . Iodine Anaphylaxis  . Lisinopril     cough  . Crestor [Rosuvastatin]     Muscle aches  . Wilder Glade [Dapagliflozin] Other (See Comments)    Joint pain and weakness  . Iohexol Swelling  . Peanut-Containing Drug Products Itching    And whelps  . Aspirin Rash  . Codeine Rash  . Penicillins Rash    Has patient had a PCN reaction causing immediate rash, facial/tongue/throat swelling, SOB or lightheadedness with hypotension: Yes Has patient had a PCN reaction causing severe rash involving mucus membranes or skin necrosis: Yes Has patient had a PCN reaction that required hospitalization No Has patient had a PCN reaction occurring within the last 10 years: No If all of the above answers are "NO", then may proceed with Cephalosporin use.     Family History  Problem Relation Age of Onset  . Hyperlipidemia Mother   . Hypertension Mother   . Stroke Father   . Kidney disease Father   . Diabetes Father   . Cancer Maternal Grandfather   . Anesthesia problems Neg Hx   . Hypotension Neg Hx   .  Malignant hyperthermia Neg Hx   . Pseudochol deficiency Neg Hx     BP 110/64 (BP Location: Left Arm, Patient Position: Sitting, Cuff Size: Large)   Pulse 80   Ht 5\' 11"  (1.803 m)   Wt 214 lb 6.4 oz (97.3 kg)   SpO2 97%   BMI 29.90 kg/m    Review of Systems He denies hypoglycemia and nausea.      Objective:  Physical Exam VITAL SIGNS:  See vs page GENERAL: no distress Pulses: dorsalis pedis intact bilat.   MSK: no deformity of the feet CV: no leg edema Skin:  no ulcer on the feet.  normal color and temp on the feet. Neuro: sensation is intact to touch on the feet  Lab Results  Component Value Date   HGBA1C 6.8 (A) 03/14/2019   Lab Results  Component Value Date   CREATININE 1.22 02/10/2019   BUN 28 (H) 02/10/2019   NA 136 02/10/2019   K 4.5 02/10/2019   CL 99 02/10/2019   CO2 27 02/10/2019        Assessment & Plan:  Type 2 DM: he would benefit from increased rx, if it can be done with a regimen that avoids or minimizes hypoglycemia. CRI: This limits rx options.  Nausea: we discussed. He wants to try increasing trulicity despite this.  Patient Instructions  Here is a prescription, to double the Trulicity. Please continue the same other diabetes medications.   check your blood sugar once a day.  vary the time of day when you check, between before the 3 meals, and at bedtime.  also check if you have symptoms of your blood sugar being too high or too low.  please keep a record of the readings and bring it to your next appointment here (or you can bring the meter itself).  You can write it on any piece of paper.  please call us sooner if your blood sugar goes below 70, or if you have a lot of readings over 200. Please come back for a follow-up appointment in 3 months.

## 2019-03-14 NOTE — Patient Instructions (Addendum)
Here is a prescription, to double the Trulicity. Please continue the same other diabetes medications.   check your blood sugar once a day.  vary the time of day when you check, between before the 3 meals, and at bedtime.  also check if you have symptoms of your blood sugar being too high or too low.  please keep a record of the readings and bring it to your next appointment here (or you can bring the meter itself).  You can write it on any piece of paper.  please call us sooner if your blood sugar goes below 70, or if you have a lot of readings over 200. Please come back for a follow-up appointment in 3 months.

## 2019-03-28 ENCOUNTER — Encounter: Payer: Self-pay | Admitting: Internal Medicine

## 2019-05-09 DIAGNOSIS — M6208 Separation of muscle (nontraumatic), other site: Secondary | ICD-10-CM | POA: Diagnosis not present

## 2019-05-12 ENCOUNTER — Other Ambulatory Visit: Payer: Self-pay | Admitting: Internal Medicine

## 2019-05-12 ENCOUNTER — Ambulatory Visit (INDEPENDENT_AMBULATORY_CARE_PROVIDER_SITE_OTHER)
Admission: RE | Admit: 2019-05-12 | Discharge: 2019-05-12 | Disposition: A | Discharge status: Home / Self Care | Payer: BC Managed Care – PPO | Source: Ambulatory Visit | Attending: Internal Medicine | Admitting: Internal Medicine

## 2019-05-12 ENCOUNTER — Ambulatory Visit: Payer: BC Managed Care – PPO | Admitting: Internal Medicine

## 2019-05-12 ENCOUNTER — Other Ambulatory Visit: Payer: Self-pay

## 2019-05-12 ENCOUNTER — Encounter: Payer: Self-pay | Admitting: Internal Medicine

## 2019-05-12 VITALS — BP 120/60 | HR 50 | Temp 98.4°F | Resp 16 | Ht 71.0 in | Wt 218.5 lb

## 2019-05-12 DIAGNOSIS — G8929 Other chronic pain: Secondary | ICD-10-CM

## 2019-05-12 DIAGNOSIS — M25552 Pain in left hip: Secondary | ICD-10-CM

## 2019-05-12 DIAGNOSIS — M1612 Unilateral primary osteoarthritis, left hip: Secondary | ICD-10-CM | POA: Diagnosis not present

## 2019-05-12 MED ORDER — RELAFEN DS 1000 MG PO TABS: 1.0000 | ORAL_TABLET | Freq: Every day | ORAL | 0 refills | Status: DC | PRN

## 2019-05-12 NOTE — Patient Instructions (Signed)
Hip Pain The hip is the joint between the upper legs and the lower pelvis. The bones, cartilage, tendons, and muscles of your hip joint support your body and allow you to move around. Hip pain can range from a minor ache to severe pain in one or both of your hips. The pain may be felt on the inside of the hip joint near the groin, or on the outside near the buttocks and upper thigh. You may also have swelling or stiffness in your hip area. Follow these instructions at home: Managing pain, stiffness, and swelling      If directed, put ice on the painful area. To do this: ? Put ice in a plastic bag. ? Place a towel between your skin and the bag. ? Leave the ice on for 20 minutes, 2-3 times a day.  If directed, apply heat to the affected area as often as told by your health care provider. Use the heat source that your health care provider recommends, such as a moist heat pack or a heating pad. ? Place a towel between your skin and the heat source. ? Leave the heat on for 20-30 minutes. ? Remove the heat if your skin turns bright red. This is especially important if you are unable to feel pain, heat, or cold. You may have a greater risk of getting burned. Activity  Do exercises as told by your health care provider.  Avoid activities that cause pain. General instructions   Take over-the-counter and prescription medicines only as told by your health care provider.  Keep a journal of your symptoms. Write down: ? How often you have hip pain. ? The location of your pain. ? What the pain feels like. ? What makes the pain worse.  Sleep with a pillow between your legs on your most comfortable side.  Keep all follow-up visits as told by your health care provider. This is important. Contact a health care provider if:  You cannot put weight on your leg.  Your pain or swelling continues or gets worse after one week.  It gets harder to walk.  You have a fever. Get help right away  if:  You fall.  You have a sudden increase in pain and swelling in your hip.  Your hip is red or swollen or very tender to touch. Summary  Hip pain can range from a minor ache to severe pain in one or both of your hips.  The pain may be felt on the inside of the hip joint near the groin, or on the outside near the buttocks and upper thigh.  Avoid activities that cause pain.  Write down how often you have hip pain, the location of the pain, what makes it worse, and what it feels like. This information is not intended to replace advice given to you by your health care provider. Make sure you discuss any questions you have with your health care provider. Document Revised: 05/13/2018 Document Reviewed: 05/13/2018 Elsevier Patient Education  2020 [employer] -- 

## 2019-05-12 NOTE — Progress Notes (Signed)
Subjective:  Patient ID: Terry Harrington, male    DOB: 08/06/1955  Age: 64 y.o. MRN: [id number]  CC: Hip Pain  This visit occurred during the SARS-CoV-2 public health emergency.  Safety protocols were in place, including screening questions prior to the visit, additional usage of staff PPE, and extensive cleaning of exam room while observing appropriate contact time as indicated for disinfecting solutions.    HPI Terry Harrington presents for a 37-month history of left hip pain.  He denies any history of trauma or injury.  He describes an achy sensation deep in the hip.  The pain worsens when he lays down and becomes sharp with movement.  He has not gotten much symptom relief with over-the-counter doses of Aleve and Advil.  Outpatient Medications Prior to Visit  Medication Sig Dispense Refill  . Cholecalciferol (VITAMIN D3) 1.25 MG (50000 UT) CAPS Take 1 capsule by mouth daily. 4 capsule 0  . Dulaglutide (TRULICITY) 3 MG/0.5ML SOPN Inject 3 mg into the skin once a week. 12 pen 3  . fenofibrate (TRICOR) 48 MG tablet Take 1 tablet (48 mg total) by mouth daily. 90 tablet 1  . JARDIANCE 25 MG TABS tablet TAKE 1 TABLET BY MOUTH DAILY 90 tablet 1  . levothyroxine (SYNTHROID) 100 MCG tablet Take 1 tablet (100 mcg total) by mouth daily. 90 tablet 1  . loratadine (CLARITIN) 10 MG tablet Take 10 mg by mouth daily.    . metFORMIN (GLUCOPHAGE) 500 MG tablet Take 1 tablet (500 mg total) by mouth 2 (two) times daily with a meal. 180 tablet 3  . Olmesartan-amLODIPine-HCTZ 40-10-12.5 MG TABS Take 1 tablet by mouth daily. 90 tablet 0  . pioglitazone (ACTOS) 45 MG tablet Take 1 tablet (45 mg total) by mouth daily. 90 tablet 3  . Potassium Chloride (K+ POTASSIUM PO) Take by mouth.    . pravastatin (PRAVACHOL) 40 MG tablet TAKE 1 TABLET BY MOUTH EVERY DAY 90 tablet 1  . sildenafil (VIAGRA) 100 MG tablet Take 1 tablet (100 mg total) by mouth daily as needed for erectile dysfunction. 30 tablet 5  .  testosterone (ANDROGEL) 50 MG/5GM (1%) GEL Place 5 g onto the skin daily. 150 g 5  . vitamin B-12 (CYANOCOBALAMIN) 1000 MCG tablet Take 2,000 mcg by mouth daily.     No facility-administered medications prior to visit.    ROS Review of Systems  Constitutional: Negative for chills, fatigue and fever.  HENT: Negative.   Eyes: Negative.   Respiratory: Negative for cough, chest tightness and shortness of breath.   Cardiovascular: Negative for chest pain, palpitations and leg swelling.  Gastrointestinal: Negative for abdominal pain, constipation, diarrhea, nausea and vomiting.  Endocrine: Negative.   Genitourinary: Negative.  Negative for difficulty urinating.  Musculoskeletal: Positive for arthralgias. Negative for back pain and joint swelling.  Skin: Negative.   Neurological: Negative.  Negative for dizziness and weakness.  Hematological: Negative for adenopathy. Does not bruise/bleed easily.  Psychiatric/Behavioral: Negative.     Objective:  BP 120/60 (BP Location: Left Arm, Patient Position: Sitting, Cuff Size: Large)   Pulse (!) 50   Temp 98.4 F (36.9 C) (Oral)   Resp 16   Ht 5\' 11"  (1.803 m)   Wt 218 lb 8 oz (99.1 kg)   SpO2 93%   BMI 30.47 kg/m   BP Readings from Last 3 Encounters:  05/12/19 120/60  03/14/19 110/64  02/10/19 (!) 142/68    Wt Readings from Last 3 Encounters:  05/12/19 218 lb  8 oz (99.1 kg)  03/14/19 214 lb 6.4 oz (97.3 kg)  02/10/19 214 lb (97.1 kg)    Physical Exam Vitals reviewed.  Constitutional:      Appearance: Normal appearance.  Eyes:     General: No scleral icterus.    Conjunctiva/sclera: Conjunctivae normal.  Cardiovascular:     Rate and Rhythm: Normal rate and regular rhythm.  Pulmonary:     Effort: Pulmonary effort is normal.     Breath sounds: No stridor. No wheezing, rhonchi or rales.  Abdominal:     General: Abdomen is flat. Bowel sounds are normal. There is no distension.     Palpations: Abdomen is soft. There is no  hepatomegaly, splenomegaly or mass.  Musculoskeletal:        General: Normal range of motion.     Cervical back: Neck supple.     Right hip: Normal.     Left hip: Normal. No deformity, tenderness or bony tenderness. Normal range of motion.     Right lower leg: No edema.     Left lower leg: No edema.  Skin:    General: Skin is warm and dry.  Neurological:     General: No focal deficit present.     Mental Status: He is alert.     Lab Results  Component Value Date   WBC 5.4 02/10/2019   HGB 14.5 02/10/2019   HCT 43.2 02/10/2019   PLT 285.0 02/10/2019   GLUCOSE 101 (H) 02/10/2019   CHOL 186 02/10/2019   TRIG 304.0 (H) 02/10/2019   HDL 58.40 02/10/2019   LDLDIRECT 86.0 02/10/2019   LDLCALC 77 08/21/2016   ALT 12 02/10/2019   AST 11 02/10/2019   NA 136 02/10/2019   K 4.5 02/10/2019   CL 99 02/10/2019   CREATININE 1.22 02/10/2019   BUN 28 (H) 02/10/2019   CO2 27 02/10/2019   TSH 1.60 02/10/2019   PSA 0.46 02/10/2019   HGBA1C 6.8 (A) 03/14/2019   MICROALBUR <0.7 02/10/2019    DG HIP UNILAT WITH PELVIS 2-3 VIEWS LEFT  Result Date: 05/13/2019 CLINICAL DATA:  Pain EXAM: DG HIP (WITH OR WITHOUT PELVIS) 2-3V LEFT COMPARISON:  None. FINDINGS: Frontal pelvis as well as weightbearing frontal and supine lateral left hip images obtained. There is no appreciable fracture or dislocation. There is mild symmetric narrowing of each hip joint. No erosive change. Sacroiliac joints appear unremarkable bilaterally. There is lower lumbar levoscoliosis. IMPRESSION: Symmetric narrowing hip joints. No fracture or dislocation. Lower lumbar levoscoliosis. Electronically Signed   By: Lowella Grip III M.D.   On: 05/13/2019 08:32    Assessment & Plan:   [name] was seen today for hip pain.  Diagnoses and all orders for this visit:  Chronic left hip pain- Based on his symptoms, exam, and plain films this is early osteoarthritis.  I recommended that he start taking a prescription strength  anti-inflammatory. -     Cancel: DG HIP UNILAT WITH PELVIS MIN 4 VIEWS LEFT; Future  Primary osteoarthritis of left hip -     Nabumetone (RELAFEN DS) 1000 MG TABS; Take 1 tablet by mouth daily as needed.   I am having Terry Harrington start on Relafen DS. I am also having him maintain his vitamin B-12, sildenafil, Potassium Chloride (K+ POTASSIUM PO), loratadine, pravastatin, levothyroxine, testosterone, pioglitazone, metFORMIN, Vitamin D3, Jardiance, fenofibrate, Olmesartan-amLODIPine-HCTZ, and Trulicity.  Meds ordered this encounter  Medications  . Nabumetone (RELAFEN DS) 1000 MG TABS    Sig: Take 1 tablet by  mouth daily as needed.    Dispense:  12 tablet    Refill:  0     Follow-up: Return in about 3 months (around 08/12/2019).  Sanda Linger, MD

## 2019-05-14 ENCOUNTER — Encounter: Payer: Self-pay | Admitting: Internal Medicine

## 2019-06-06 ENCOUNTER — Other Ambulatory Visit: Payer: Self-pay | Admitting: Internal Medicine

## 2019-06-06 DIAGNOSIS — I10 Essential (primary) hypertension: Secondary | ICD-10-CM

## 2019-06-06 DIAGNOSIS — E118 Type 2 diabetes mellitus with unspecified complications: Secondary | ICD-10-CM

## 2019-06-23 ENCOUNTER — Other Ambulatory Visit: Payer: Self-pay | Admitting: Internal Medicine

## 2019-06-23 DIAGNOSIS — E039 Hypothyroidism, unspecified: Secondary | ICD-10-CM

## 2019-07-11 ENCOUNTER — Encounter: Payer: Self-pay | Admitting: Endocrinology

## 2019-07-11 ENCOUNTER — Other Ambulatory Visit: Payer: Self-pay

## 2019-07-11 ENCOUNTER — Ambulatory Visit: Payer: BC Managed Care – PPO | Admitting: Endocrinology

## 2019-07-11 VITALS — BP 120/76 | HR 78 | Ht 71.0 in | Wt 219.4 lb

## 2019-07-11 DIAGNOSIS — E118 Type 2 diabetes mellitus with unspecified complications: Secondary | ICD-10-CM | POA: Diagnosis not present

## 2019-07-11 LAB — POCT GLYCOSYLATED HEMOGLOBIN (HGB A1C): Hemoglobin A1C: 7 % — AB (ref 4.0–5.6)

## 2019-07-11 MED ORDER — TRULICITY 4.5 MG/0.5ML ~~LOC~~ SOAJ
4.5000 mg | SUBCUTANEOUS | 3 refills | Status: DC
Start: 2019-07-11 — End: 2020-10-18

## 2019-07-11 NOTE — Progress Notes (Signed)
Subjective:    Patient ID: Terry Harrington, male    DOB: [birth year], 64 y.o.   MRN: [id number]  HPI Pt returns for f/u of diabetes mellitus:  DM type: 2 Dx'ed: 2010 Complications: stage 3 CRI Therapy: trulicity and 3 oral meds.  DKA: never.  Severe hypoglycemia: never.  Pancreatitis: never Other: he took insulin 2015-2017; in early 2017, he was hospitalized for nonketotic hyperosmolar hyperglycemic state, due to missing insulin doses, he did not tolerate farxiga (arthralgias); he works M-F, day shift.  He says cbg's are higher on weekends.  Interval history: pt says cbg's are well-controlled.   Pt also has primary hypogonadism (dx'ed 2018; he has lifelong infertility, but karyotype was normal; he took androgel 2012-2014; testosterone level was low, but he is on max safe dosage (50 mg/d)).   Past Medical History:  Diagnosis Date  . Diabetes mellitus   . GERD (gastroesophageal reflux disease)   . Heart murmur   . Hyperlipidemia   . Hypertension    on meds  . Hypothyroidism    on meds    Past Surgical History:  Procedure Laterality Date  . DENTAL SURGERY     several dental procedure approx 10 between 1999-2012  . HERNIA REPAIR     as a child  . SHOULDER ARTHROSCOPY  05/31/2011   Procedure: ARTHROSCOPY SHOULDER;  Surgeon: Kennieth Rad, MD;  Location: [facility] OR;  Service: Orthopedics;  Laterality: Left;  Shoulder Acromioplasty Left  . SHOULDER SURGERY  05/31/2011    impingment left    Social History   Socioeconomic History  . Marital status: Divorced    Spouse name: Not on file  . Number of children: Not on file  . Years of education: Not on file  . Highest education level: Not on file  Occupational History  . Occupation: Holiday representative  Tobacco Use  . Smoking status: Former Smoker    Packs/day: 1.50    Years: 26.00    Pack years: 39.00    Types: Cigarettes    Quit date: 03/02/2008    Years since quitting: 11.3  . Smokeless tobacco: Former Neurosurgeon    Quit date:  05/25/2008  Substance and Sexual Activity  . Alcohol use: Yes    Alcohol/week: 0.0 standard drinks    Comment: rarely  . Drug use: No  . Sexual activity: Not Currently  Other Topics Concern  . Not on file  Social History Narrative  . Not on file   Social Determinants of Health   Financial Resource Strain:   . Difficulty of Paying Living Expenses:   Food Insecurity:   . Worried About Programme researcher, broadcasting/film/video in the Last Year:   . Barista in the Last Year:   Transportation Needs:   . Freight forwarder (Medical):   Marland Kitchen Lack of Transportation (Non-Medical):   Physical Activity:   . Days of Exercise per Week:   . Minutes of Exercise per Session:   Stress:   . Feeling of Stress :   Social Connections:   . Frequency of Communication with Friends and Family:   . Frequency of Social Gatherings with Friends and Family:   . Attends Religious Services:   . Active Member of Clubs or Organizations:   . Attends Banker Meetings:   Marland Kitchen Marital Status:   Intimate Partner Violence:   . Fear of Current or Ex-Partner:   . Emotionally Abused:   Marland Kitchen Physically Abused:   . Sexually Abused:  Current Outpatient Medications on File Prior to Visit  Medication Sig Dispense Refill  . Cholecalciferol (VITAMIN D3) 1.25 MG (50000 UT) CAPS Take 1 capsule by mouth daily. 4 capsule 0  . fenofibrate (TRICOR) 48 MG tablet Take 1 tablet (48 mg total) by mouth daily. 90 tablet 1  . JARDIANCE 25 MG TABS tablet TAKE 1 TABLET BY MOUTH DAILY 90 tablet 1  . levothyroxine (SYNTHROID) 100 MCG tablet TAKE 1 TABLET BY MOUTH EVERY DAY 90 tablet 1  . loratadine (CLARITIN) 10 MG tablet Take 10 mg by mouth daily.    . metFORMIN (GLUCOPHAGE) 500 MG tablet Take 1 tablet (500 mg total) by mouth 2 (two) times daily with a meal. 180 tablet 3  . Nabumetone (RELAFEN DS) 1000 MG TABS Take 1 tablet by mouth daily as needed. 12 tablet 0  . Olmesartan-amLODIPine-HCTZ 40-10-12.5 MG TABS TAKE 1 TABLET BY MOUTH  EVERY DAY 90 tablet 0  . pioglitazone (ACTOS) 45 MG tablet Take 1 tablet (45 mg total) by mouth daily. 90 tablet 3  . Potassium Chloride (K+ POTASSIUM PO) Take by mouth.    . pravastatin (PRAVACHOL) 40 MG tablet TAKE 1 TABLET BY MOUTH EVERY DAY 90 tablet 1  . sildenafil (VIAGRA) 100 MG tablet Take 1 tablet (100 mg total) by mouth daily as needed for erectile dysfunction. 30 tablet 5  . testosterone (ANDROGEL) 50 MG/5GM (1%) GEL Place 5 g onto the skin daily. 150 g 5  . vitamin B-12 (CYANOCOBALAMIN) 1000 MCG tablet Take 2,000 mcg by mouth daily.    . [DISCONTINUED] levothyroxine (SYNTHROID) 100 MCG tablet Take 1 tablet (100 mcg total) by mouth daily. 90 tablet 1  . [DISCONTINUED] Olmesartan-amLODIPine-HCTZ 40-10-12.5 MG TABS Take 1 tablet by mouth daily. 90 tablet 0   No current facility-administered medications on file prior to visit.    Allergies  Allergen Reactions  . Crab [Shellfish Allergy] Anaphylaxis  . Iodine Anaphylaxis  . Lisinopril     cough  . Crestor [Rosuvastatin]     Muscle aches  . Marcelline Deist [Dapagliflozin] Other (See Comments)    Joint pain and weakness  . Iohexol Swelling  . Peanut-Containing Drug Products Itching    And whelps  . Aspirin Rash  . Codeine Rash  . Penicillins Rash    Has patient had a PCN reaction causing immediate rash, facial/tongue/throat swelling, SOB or lightheadedness with hypotension: Yes Has patient had a PCN reaction causing severe rash involving mucus membranes or skin necrosis: Yes Has patient had a PCN reaction that required hospitalization No Has patient had a PCN reaction occurring within the last 10 years: No If all of the above answers are "NO", then may proceed with Cephalosporin use.     Family History  Problem Relation Age of Onset  . Hyperlipidemia Mother   . Hypertension Mother   . Stroke Father   . Kidney disease Father   . Diabetes Father   . Cancer Maternal Grandfather   . Anesthesia problems Neg Hx   . Hypotension  Neg Hx   . Malignant hyperthermia Neg Hx   . Pseudochol deficiency Neg Hx     BP 120/76   Pulse 78   Ht 5\' 11"  (1.803 m)   Wt 219 lb 6.4 oz (99.5 kg)   SpO2 95%   BMI 30.60 kg/m    Review of Systems Denies nausea.  He has gained a few lbs    Objective:   Physical Exam VITAL SIGNS:  See vs page GENERAL: no distress  Pulses: dorsalis pedis intact bilat.   MSK: no deformity of the feet CV: no leg edema Skin:  no ulcer on the feet.  normal color and temp on the feet. Neuro: sensation is intact to touch on the feet.   A1c=7.0%  Lab Results  Component Value Date   CREATININE 1.22 02/10/2019   BUN 28 (H) 02/10/2019   NA 136 02/10/2019   K 4.5 02/10/2019   CL 99 02/10/2019   CO2 27 02/10/2019       Assessment & Plan:  Type 2 DM, with stage 3 CRI: worse  Patient Instructions  Here is a prescription, to increase the Trulicity again.  You can use up your current shots by taking 1 every 5 days   Please continue the same other diabetes medications.   check your blood sugar once a day.  vary the time of day when you check, between before the 3 meals, and at bedtime.  also check if you have symptoms of your blood sugar being too high or too low.  please keep a record of the readings and bring it to your next appointment here (or you can bring the meter itself).  You can write it on any piece of paper.  please call us sooner if your blood sugar goes below 70, or if you have a lot of readings over 200. Please come back for a follow-up appointment in 3-4 months.

## 2019-07-11 NOTE — Patient Instructions (Addendum)
Here is a prescription, to increase the Trulicity again.  You can use up your current shots by taking 1 every 5 days   Please continue the same other diabetes medications.   check your blood sugar once a day.  vary the time of day when you check, between before the 3 meals, and at bedtime.  also check if you have symptoms of your blood sugar being too high or too low.  please keep a record of the readings and bring it to your next appointment here (or you can bring the meter itself).  You can write it on any piece of paper.  please call us sooner if your blood sugar goes below 70, or if you have a lot of readings over 200. Please come back for a follow-up appointment in 3-4 months.

## 2019-07-13 NOTE — Addendum Note (Signed)
Addended by: Romero Belling on: 07/13/2019 03:28 PM   Modules accepted: Level of Service

## 2019-07-23 ENCOUNTER — Other Ambulatory Visit: Payer: Self-pay | Admitting: Internal Medicine

## 2019-07-23 DIAGNOSIS — E785 Hyperlipidemia, unspecified: Secondary | ICD-10-CM

## 2019-08-12 ENCOUNTER — Ambulatory Visit: Payer: BC Managed Care – PPO | Admitting: Internal Medicine

## 2019-08-21 ENCOUNTER — Other Ambulatory Visit: Payer: Self-pay

## 2019-08-21 ENCOUNTER — Encounter: Payer: Self-pay | Admitting: Internal Medicine

## 2019-08-21 ENCOUNTER — Ambulatory Visit: Payer: BC Managed Care – PPO | Admitting: Internal Medicine

## 2019-08-21 VITALS — BP 140/60 | HR 86 | Temp 99.1°F | Resp 16 | Ht 71.0 in | Wt 219.0 lb

## 2019-08-21 DIAGNOSIS — Z23 Encounter for immunization: Secondary | ICD-10-CM | POA: Diagnosis not present

## 2019-08-21 DIAGNOSIS — E039 Hypothyroidism, unspecified: Secondary | ICD-10-CM

## 2019-08-21 DIAGNOSIS — M1612 Unilateral primary osteoarthritis, left hip: Secondary | ICD-10-CM

## 2019-08-21 DIAGNOSIS — E781 Pure hyperglyceridemia: Secondary | ICD-10-CM | POA: Diagnosis not present

## 2019-08-21 DIAGNOSIS — E118 Type 2 diabetes mellitus with unspecified complications: Secondary | ICD-10-CM

## 2019-08-21 DIAGNOSIS — E559 Vitamin D deficiency, unspecified: Secondary | ICD-10-CM

## 2019-08-21 DIAGNOSIS — I10 Essential (primary) hypertension: Secondary | ICD-10-CM

## 2019-08-21 LAB — TRIGLYCERIDES: Triglycerides: 232 mg/dL — ABNORMAL HIGH (ref <150)

## 2019-08-21 MED ORDER — EMPAGLIFLOZIN 25 MG PO TABS: 25.0000 mg | ORAL_TABLET | Freq: Every day | ORAL | 1 refills | Status: DC

## 2019-08-21 MED ORDER — RELAFEN DS 1000 MG PO TABS: 2.0000 | ORAL_TABLET | Freq: Every day | ORAL | 1 refills | Status: DC | PRN

## 2019-08-21 NOTE — Patient Instructions (Signed)

## 2019-08-21 NOTE — Progress Notes (Signed)
Subjective:  Patient ID: Terry Harrington, male    DOB: 1955/05/27  Age: 64 y.o. MRN: [id number]  CC: Diabetes, Hypertension, and Hypothyroidism  This visit occurred during the SARS-CoV-2 public health emergency.  Safety protocols were in place, including screening questions prior to the visit, additional usage of staff PPE, and extensive cleaning of exam room while observing appropriate contact time as indicated for disinfecting solutions.    HPI [name] presents for f/up - He complains of chronic, unchanged fatigue and dyspnea on exertion.  He is of active and denies any recent episodes of chest pain, diaphoresis, dizziness, or lightheadedness.  He has chronic hip pain due to osteoarthritis and wants to increase the nabumetone dose to 2 tablets a day.  Outpatient Medications Prior to Visit  Medication Sig Dispense Refill  . Dulaglutide (TRULICITY) 4.5 MG/0.5ML SOPN Inject 0.5 mLs (4.5 mg total) as directed once a week. 12 pen 3  . fenofibrate (TRICOR) 48 MG tablet Take 1 tablet (48 mg total) by mouth daily. 90 tablet 1  . levothyroxine (SYNTHROID) 100 MCG tablet TAKE 1 TABLET BY MOUTH EVERY DAY 90 tablet 1  . loratadine (CLARITIN) 10 MG tablet Take 10 mg by mouth daily.    . metFORMIN (GLUCOPHAGE) 500 MG tablet Take 1 tablet (500 mg total) by mouth 2 (two) times daily with a meal. 180 tablet 3  . Olmesartan-amLODIPine-HCTZ 40-10-12.5 MG TABS TAKE 1 TABLET BY MOUTH EVERY DAY 90 tablet 0  . pioglitazone (ACTOS) 45 MG tablet Take 1 tablet (45 mg total) by mouth daily. 90 tablet 3  . Potassium Chloride (K+ POTASSIUM PO) Take by mouth.    . pravastatin (PRAVACHOL) 40 MG tablet TAKE 1 TABLET BY MOUTH EVERY DAY 90 tablet 1  . testosterone (ANDROGEL) 50 MG/5GM (1%) GEL Place 5 g onto the skin daily. 150 g 5  . vitamin B-12 (CYANOCOBALAMIN) 1000 MCG tablet Take 2,000 mcg by mouth daily.    . Cholecalciferol (VITAMIN D3) 1.25 MG (50000 UT) CAPS Take 1 capsule by mouth daily. 4 capsule 0    . JARDIANCE 25 MG TABS tablet TAKE 1 TABLET BY MOUTH DAILY 90 tablet 1  . Nabumetone (RELAFEN DS) 1000 MG TABS Take 1 tablet by mouth daily as needed. 12 tablet 0  . sildenafil (VIAGRA) 100 MG tablet Take 1 tablet (100 mg total) by mouth daily as needed for erectile dysfunction. 30 tablet 5   No facility-administered medications prior to visit.    ROS Review of Systems  Constitutional: Positive for fatigue. Negative for appetite change, chills, diaphoresis and unexpected weight change.  HENT: Negative.   Eyes: Negative for visual disturbance.  Respiratory: Positive for shortness of breath. Negative for choking, chest tightness and wheezing.   Cardiovascular: Negative for chest pain, palpitations and leg swelling.  Gastrointestinal: Negative for abdominal pain, constipation, diarrhea, nausea and vomiting.  Endocrine: Negative.  Negative for cold intolerance and heat intolerance.  Musculoskeletal: Negative.  Negative for arthralgias, back pain, myalgias and neck pain.  Skin: Negative.  Negative for color change, pallor and rash.  Neurological: Negative.  Negative for dizziness, weakness, light-headedness, numbness and headaches.  Hematological: Negative for adenopathy. Does not bruise/bleed easily.  Psychiatric/Behavioral: Negative.     Objective:  BP 140/60 (BP Location: Left Arm, Patient Position: Sitting, Cuff Size: Large)   Pulse 86   Temp 99.1 F (37.3 C) (Oral)   Resp 16   Ht 5\' 11"  (1.803 m)   Wt 219 lb (99.3 kg)  SpO2 95%   BMI 30.54 kg/m   BP Readings from Last 3 Encounters:  08/22/19 122/67  08/21/19 140/60  07/11/19 120/76    Wt Readings from Last 3 Encounters:  08/22/19 219 lb (99.3 kg)  08/21/19 219 lb (99.3 kg)  07/11/19 219 lb 6.4 oz (99.5 kg)    Physical Exam Vitals reviewed.  Constitutional:      Appearance: Normal appearance.  HENT:     Nose: Nose normal.     Mouth/Throat:     Mouth: Mucous membranes are moist.  Eyes:     General: No scleral  icterus.    Conjunctiva/sclera: Conjunctivae normal.  Cardiovascular:     Rate and Rhythm: Normal rate and regular rhythm.     Heart sounds: No murmur heard.   Pulmonary:     Effort: Pulmonary effort is normal.     Breath sounds: No stridor. No wheezing, rhonchi or rales.  Abdominal:     General: Abdomen is flat. Bowel sounds are normal. There is no distension.     Palpations: Abdomen is soft. There is no hepatomegaly, splenomegaly or mass.     Tenderness: There is no abdominal tenderness.  Musculoskeletal:        General: Normal range of motion.     Cervical back: Neck supple.     Right lower leg: No edema.     Left lower leg: No edema.  Lymphadenopathy:     Cervical: No cervical adenopathy.  Skin:    General: Skin is warm and dry.     Coloration: Skin is not pale.  Neurological:     General: No focal deficit present.     Mental Status: He is alert.  Psychiatric:        Mood and Affect: Mood normal.        Behavior: Behavior normal.     Lab Results  Component Value Date   WBC 5.4 02/10/2019   HGB 14.5 02/10/2019   HCT 43.2 02/10/2019   PLT 285.0 02/10/2019   GLUCOSE 142 (H) 08/21/2019   CHOL 186 02/10/2019   TRIG 232 (H) 08/21/2019   HDL 58.40 02/10/2019   LDLDIRECT 86.0 02/10/2019   LDLCALC 77 08/21/2016   ALT 12 02/10/2019   AST 11 02/10/2019   NA 137 08/21/2019   K 4.1 08/21/2019   CL 103 08/21/2019   CREATININE 1.36 (H) 08/21/2019   BUN 20 08/21/2019   CO2 25 08/21/2019   TSH 1.74 08/21/2019   PSA 0.46 02/10/2019   HGBA1C 7.0 (A) 07/11/2019   MICROALBUR <0.7 02/10/2019    DG HIP UNILAT WITH PELVIS 2-3 VIEWS LEFT  Result Date: 05/13/2019 CLINICAL DATA:  Pain EXAM: DG HIP (WITH OR WITHOUT PELVIS) 2-3V LEFT COMPARISON:  None. FINDINGS: Frontal pelvis as well as weightbearing frontal and supine lateral left hip images obtained. There is no appreciable fracture or dislocation. There is mild symmetric narrowing of each hip joint. No erosive change. Sacroiliac  joints appear unremarkable bilaterally. There is lower lumbar levoscoliosis. IMPRESSION: Symmetric narrowing hip joints. No fracture or dislocation. Lower lumbar levoscoliosis. Electronically Signed   By: Bretta Bang III M.D.   On: 05/13/2019 08:32    Assessment & Plan:   Gotti was seen today for diabetes, hypertension and hypothyroidism.  Diagnoses and all orders for this visit:  Essential hypertension- His blood pressure is well controlled.  Electrolytes and renal function are normal. -     BASIC METABOLIC PANEL WITH GFR; Future -     BASIC  METABOLIC PANEL WITH GFR  Hypothyroidism (acquired)- His TSH is in the normal range.  He will remain on the current dose of levothyroxine. -     TSH; Future  -     TSH  Pure hypertriglyceridemia- His triglycerides remain elevated but he has an allergy to shellfish so a fish oil supplement cannot be started.  I encouraged him to improve his lifestyle modifications.  Will continue the fenofibric acid derivative. -     Triglycerides; Future -     Triglycerides  Vitamin D deficiency disease -     VITAMIN D 25 Hydroxy (Vit-D Deficiency, Fractures); Future -     VITAMIN D 25 Hydroxy (Vit-D Deficiency, Fractures) -     Cholecalciferol (VITAMIN D3) 1.25 MG (50000 UT) CAPS; Take 1 capsule by mouth daily.  Type II diabetes mellitus with manifestations (HCC)- His recent A1c was 7.0%.  I recommended that he continue the combination of a GLP-1 agonist, Metformin, and an SGLT2 inhibitor. -     HM Diabetes Foot Exam -     empagliflozin (JARDIANCE) 25 MG TABS tablet; Take 1 tablet (25 mg total) by mouth daily.  Primary osteoarthritis of left hip -     Nabumetone (RELAFEN DS) 1000 MG TABS; Take 2 tablets by mouth daily as needed.  Need for pneumococcal vaccination -     Pneumococcal polysaccharide vaccine 23-valent greater than or equal to 2yo subcutaneous/IM   I have discontinued Despina Arias. Knickerbocker's sildenafil. I have changed his Jardiance to  empagliflozin. I have also changed his Relafen DS. Additionally, I am having him maintain his vitamin B-12, Potassium Chloride (K+ POTASSIUM PO), loratadine, testosterone, pioglitazone, metFORMIN, fenofibrate, Olmesartan-amLODIPine-HCTZ, levothyroxine, Trulicity, pravastatin, and Vitamin D3.  Meds ordered this encounter  Medications  . empagliflozin (JARDIANCE) 25 MG TABS tablet    Sig: Take 1 tablet (25 mg total) by mouth daily.    Dispense:  90 tablet    Refill:  1  . Nabumetone (RELAFEN DS) 1000 MG TABS    Sig: Take 2 tablets by mouth daily as needed.    Dispense:  180 tablet    Refill:  1  . Cholecalciferol (VITAMIN D3) 1.25 MG (50000 UT) CAPS    Sig: Take 1 capsule by mouth daily.    Dispense:  12 capsule    Refill:  0   I spent 50 minutes in preparing to see the patient by review of recent labs, imaging and procedures, obtaining and reviewing separately obtained history, communicating with the patient and family or caregiver, ordering medications, tests or procedures, and documenting clinical information in the EHR including the differential Dx, treatment, and any further evaluation and other management of 1. Essential hypertension 2. Hypothyroidism (acquired) 3. Pure hypertriglyceridemia 4. Vitamin D deficiency disease 5. Type II diabetes mellitus with manifestations (HCC) 6. Primary osteoarthritis of left hip    Follow-up: Return in about 6 months (around 02/21/2020).  Sanda Linger, MD

## 2019-08-22 ENCOUNTER — Ambulatory Visit: Payer: BC Managed Care – PPO | Admitting: Plastic Surgery

## 2019-08-22 ENCOUNTER — Encounter: Payer: Self-pay | Admitting: Plastic Surgery

## 2019-08-22 VITALS — BP 122/67 | HR 72 | Temp 98.7°F | Ht 71.0 in | Wt 219.0 lb

## 2019-08-22 DIAGNOSIS — E039 Hypothyroidism, unspecified: Secondary | ICD-10-CM

## 2019-08-22 DIAGNOSIS — M6208 Separation of muscle (nontraumatic), other site: Secondary | ICD-10-CM | POA: Insufficient documentation

## 2019-08-22 DIAGNOSIS — E785 Hyperlipidemia, unspecified: Secondary | ICD-10-CM

## 2019-08-22 DIAGNOSIS — E118 Type 2 diabetes mellitus with unspecified complications: Secondary | ICD-10-CM | POA: Diagnosis not present

## 2019-08-22 DIAGNOSIS — M4105 Infantile idiopathic scoliosis, thoracolumbar region: Secondary | ICD-10-CM

## 2019-08-22 LAB — BASIC METABOLIC PANEL WITH GFR
BUN/Creatinine Ratio: 15 (calc) (ref 6–22)
BUN: 20 mg/dL (ref 7–25)
CO2: 25 mmol/L (ref 20–32)
Calcium: 10.1 mg/dL (ref 8.6–10.3)
Chloride: 103 mmol/L (ref 98–110)
Creat: 1.36 mg/dL — ABNORMAL HIGH (ref 0.70–1.25)
GFR, Est African American: 63 mL/min/{1.73_m2} (ref >=60)
GFR, Est Non African American: 54 mL/min/{1.73_m2} — ABNORMAL LOW (ref >=60)
Glucose, Bld: 142 mg/dL — ABNORMAL HIGH (ref 65–99)
Potassium: 4.1 mmol/L (ref 3.5–5.3)
Sodium: 137 mmol/L (ref 135–146)

## 2019-08-22 LAB — VITAMIN D 25 HYDROXY (VIT D DEFICIENCY, FRACTURES): Vit D, 25-Hydroxy: 17 ng/mL — ABNORMAL LOW (ref 30–100)

## 2019-08-22 LAB — TSH: TSH: 1.74 mIU/L (ref 0.40–4.50)

## 2019-08-22 MED ORDER — VITAMIN D3 1.25 MG (50000 UT) PO CAPS: 1.0000 | ORAL_CAPSULE | Freq: Every day | ORAL | 0 refills | Status: DC

## 2019-08-22 NOTE — Progress Notes (Signed)
Patient ID: Terry Harrington, male    DOB: [birth year], 64 y.o.   MRN: [id number]   Chief Complaint  Patient presents with  . Advice Only    The patient is a 64 year old male here for evaluation of his abdomen. He has had a long history of a gastric bulge. He had a hernia repair as a child. He has not had any other abdominal surgery. He states that the abdominal pain is worse when he lays down and especially when he tries to sit up. The area spans 12 to 15 cm and is very dramatic when he tries to sit up. You can see the diastases as well in the pictures. I do not feel a hernia and there is no history of a hernia per the general surgery note. He was referred by Dr. Sheliah Hatch. He is 5 feet 9 inches tall and weighs 215 pounds.  He has diabetes, scoliosis, hypertension and hyperlipidemia. He is a former smoker but not using any tobacco now. He is on Metformin, Jardiance and Trulicity for his diabetes. He has tried physical therapy to see if this would help improve his pain without success. He works at a [employer]. He has increased his weight by 20 pounds in the last 5 years. He has not tried any weight loss programs. He is willing to try.   Review of Systems  Constitutional: Negative for activity change and appetite change.  HENT: Negative.   Eyes: Negative.   Respiratory: Negative for chest tightness and shortness of breath.   Cardiovascular: Negative for leg swelling.  Gastrointestinal: Positive for abdominal distention and abdominal pain.  Endocrine: Negative.   Genitourinary: Negative.   Musculoskeletal: Positive for back pain, joint swelling and myalgias.  Psychiatric/Behavioral: Negative.     Past Medical History:  Diagnosis Date  . Diabetes mellitus   . GERD (gastroesophageal reflux disease)   . Heart murmur   . Hyperlipidemia   . Hypertension    on meds  . Hypothyroidism    on meds    Past Surgical History:  Procedure Laterality Date  . DENTAL SURGERY     several  dental procedure approx 10 between 1999-2012  . HERNIA REPAIR     as a child  . SHOULDER ARTHROSCOPY  05/31/2011   Procedure: ARTHROSCOPY SHOULDER;  Surgeon: Kennieth Rad, MD;  Location: [facility] OR;  Service: Orthopedics;  Laterality: Left;  Shoulder Acromioplasty Left  . SHOULDER SURGERY  05/31/2011    impingment left      Current Outpatient Medications:  .  Cholecalciferol (VITAMIN D3) 1.25 MG (50000 UT) CAPS, Take 1 capsule by mouth daily., Disp: 12 capsule, Rfl: 0 .  Dulaglutide (TRULICITY) 4.5 MG/0.5ML SOPN, Inject 0.5 mLs (4.5 mg total) as directed once a week., Disp: 12 pen, Rfl: 3 .  empagliflozin (JARDIANCE) 25 MG TABS tablet, Take 1 tablet (25 mg total) by mouth daily., Disp: 90 tablet, Rfl: 1 .  fenofibrate (TRICOR) 48 MG tablet, Take 1 tablet (48 mg total) by mouth daily., Disp: 90 tablet, Rfl: 1 .  levothyroxine (SYNTHROID) 100 MCG tablet, TAKE 1 TABLET BY MOUTH EVERY DAY, Disp: 90 tablet, Rfl: 1 .  loratadine (CLARITIN) 10 MG tablet, Take 10 mg by mouth daily., Disp: , Rfl:  .  metFORMIN (GLUCOPHAGE) 500 MG tablet, Take 1 tablet (500 mg total) by mouth 2 (two) times daily with a meal., Disp: 180 tablet, Rfl: 3 .  Nabumetone (RELAFEN DS) 1000 MG TABS, Take 2 tablets by mouth  daily as needed., Disp: 180 tablet, Rfl: 1 .  Olmesartan-amLODIPine-HCTZ 40-10-12.5 MG TABS, TAKE 1 TABLET BY MOUTH EVERY DAY, Disp: 90 tablet, Rfl: 0 .  pioglitazone (ACTOS) 45 MG tablet, Take 1 tablet (45 mg total) by mouth daily., Disp: 90 tablet, Rfl: 3 .  Potassium Chloride (K+ POTASSIUM PO), Take by mouth., Disp: , Rfl:  .  pravastatin (PRAVACHOL) 40 MG tablet, TAKE 1 TABLET BY MOUTH EVERY DAY, Disp: 90 tablet, Rfl: 1 .  testosterone (ANDROGEL) 50 MG/5GM (1%) GEL, Place 5 g onto the skin daily., Disp: 150 g, Rfl: 5 .  vitamin B-12 (CYANOCOBALAMIN) 1000 MCG tablet, Take 2,000 mcg by mouth daily., Disp: , Rfl:    Objective:   Vitals:   08/22/19 0905  BP: 122/67  Pulse: 72  Temp: 98.7 F (37.1 C)    SpO2: 98%    Physical Exam Vitals and nursing note reviewed.  Constitutional:      Appearance: Normal appearance.  HENT:     Head: Normocephalic and atraumatic.  Eyes:     Extraocular Movements: Extraocular movements intact.  Cardiovascular:     Rate and Rhythm: Normal rate.     Pulses: Normal pulses.  Pulmonary:     Effort: Pulmonary effort is normal. No respiratory distress.  Abdominal:     General: Abdomen is flat. There is distension.     Palpations: There is no mass.     Tenderness: There is abdominal tenderness. There is no guarding or rebound.  Skin:    General: Skin is warm.     Capillary Refill: Capillary refill takes less than 2 seconds.  Neurological:     General: No focal deficit present.     Mental Status: He is alert and oriented to person, place, and time. Mental status is at baseline.  Psychiatric:        Mood and Affect: Mood normal.        Behavior: Behavior normal.        Thought Content: Thought content normal.     Assessment & Plan:  Type II diabetes mellitus with manifestations (HCC)  Hypothyroidism (acquired)  Infantile idiopathic scoliosis of thoracolumbar region  Hyperlipidemia with target LDL less than 100  Rectus diastasis  The patient and I had a nice discussion about his options. Surgery is certainly an option and is for him very significant because of his diabetes. I think he would be a good candidate for repair of his rectus diastases. He has a lot of intra-abdominal fat based on his exam. I think it would be extremely helpful and important if he could reduce his weight even if only by 20 pounds. He is certainly willing to try. We will get him set up with the healthy weight and wellness center. I would like to see him back in 3 to 4 months for reevaluation. He likes this plan and is willing to try. We also talked to him today about some foods to prefer and foods to avoid.   Pictures were obtained of the patient and placed in the chart  with the patient's or guardian's permission.   Alena Bills Charnee Turnipseed, DO

## 2019-09-12 LAB — HM DIABETES EYE EXAM

## 2019-10-17 DIAGNOSIS — H353120 Nonexudative age-related macular degeneration, left eye, stage unspecified: Secondary | ICD-10-CM | POA: Diagnosis not present

## 2019-10-22 ENCOUNTER — Other Ambulatory Visit: Payer: Self-pay | Admitting: Endocrinology

## 2019-10-24 ENCOUNTER — Ambulatory Visit: Payer: BC Managed Care – PPO | Admitting: Plastic Surgery

## 2019-10-25 ENCOUNTER — Other Ambulatory Visit: Payer: Self-pay | Admitting: Endocrinology

## 2019-10-25 ENCOUNTER — Other Ambulatory Visit: Payer: Self-pay | Admitting: Internal Medicine

## 2019-10-25 DIAGNOSIS — E781 Pure hyperglyceridemia: Secondary | ICD-10-CM

## 2019-11-09 ENCOUNTER — Encounter: Payer: Self-pay | Admitting: Internal Medicine

## 2019-11-14 ENCOUNTER — Ambulatory Visit: Payer: BC Managed Care – PPO | Admitting: Endocrinology

## 2019-11-14 ENCOUNTER — Encounter: Payer: Self-pay | Admitting: Endocrinology

## 2019-11-14 ENCOUNTER — Other Ambulatory Visit: Payer: Self-pay

## 2019-11-14 VITALS — BP 133/70 | HR 73 | Ht 71.0 in | Wt 213.0 lb

## 2019-11-14 DIAGNOSIS — E118 Type 2 diabetes mellitus with unspecified complications: Secondary | ICD-10-CM | POA: Diagnosis not present

## 2019-11-14 LAB — POCT GLYCOSYLATED HEMOGLOBIN (HGB A1C): Hemoglobin A1C: 6.7 % — AB (ref 4.0–5.6)

## 2019-11-14 NOTE — Progress Notes (Addendum)
Subjective:    Patient ID: Terry Harrington, male    DOB: [birth year], 64 y.o.   MRN: [id number]  HPI Pt returns for f/u of diabetes mellitus:  DM type: 2 Dx'ed: 2010 Complications: stage 3 CRI Therapy: trulicity and 3 oral meds.  DKA: never.  Severe hypoglycemia: never.  Pancreatitis: never Other: he took insulin 2015-2017; in early 2017, he was hospitalized for nonketotic hyperosmolar hyperglycemic state, due to missing insulin doses, he did not tolerate farxiga (arthralgias); he works M-F, day shift.  He says cbg's are higher on weekends.  Interval history: pt says cbg's are well-controlled.   Pt also has primary hypogonadism (dx'ed 2018; he has lifelong infertility, but karyotype was normal; he took androgel 2012-2014; testosterone level was low, but he is on max safe dosage (50 mg/d)).  He has not refilled the androgel.   Past Medical History:  Diagnosis Date  . Diabetes mellitus   . GERD (gastroesophageal reflux disease)   . Heart murmur   . Hyperlipidemia   . Hypertension    on meds  . Hypothyroidism    on meds    Past Surgical History:  Procedure Laterality Date  . DENTAL SURGERY     several dental procedure approx 10 between 1999-2012  . HERNIA REPAIR     as a child  . SHOULDER ARTHROSCOPY  05/31/2011   Procedure: ARTHROSCOPY SHOULDER;  Surgeon: Kennieth Rad, MD;  Location: [facility] OR;  Service: Orthopedics;  Laterality: Left;  Shoulder Acromioplasty Left  . SHOULDER SURGERY  05/31/2011    impingment left    Social History   Socioeconomic History  . Marital status: Divorced    Spouse name: Not on file  . Number of children: Not on file  . Years of education: Not on file  . Highest education level: Not on file  Occupational History  . Occupation: Holiday representative  Tobacco Use  . Smoking status: Former Smoker    Packs/day: 1.50    Years: 26.00    Pack years: 39.00    Types: Cigarettes    Quit date: 03/02/2008    Years since quitting: 11.7  . Smokeless  tobacco: Former Neurosurgeon    Quit date: 05/25/2008  Substance and Sexual Activity  . Alcohol use: Yes    Alcohol/week: 0.0 standard drinks    Comment: rarely  . Drug use: No  . Sexual activity: Not Currently  Other Topics Concern  . Not on file  Social History Narrative  . Not on file   Social Determinants of Health   Financial Resource Strain:   . Difficulty of Paying Living Expenses: Not on file  Food Insecurity:   . Worried About Programme researcher, broadcasting/film/video in the Last Year: Not on file  . Ran Out of Food in the Last Year: Not on file  Transportation Needs:   . Lack of Transportation (Medical): Not on file  . Lack of Transportation (Non-Medical): Not on file  Physical Activity:   . Days of Exercise per Week: Not on file  . Minutes of Exercise per Session: Not on file  Stress:   . Feeling of Stress : Not on file  Social Connections:   . Frequency of Communication with Friends and Family: Not on file  . Frequency of Social Gatherings with Friends and Family: Not on file  . Attends Religious Services: Not on file  . Active Member of Clubs or Organizations: Not on file  . Attends Banker Meetings: Not on file  .  Marital Status: Not on file  Intimate Partner Violence:   . Fear of Current or Ex-Partner: Not on file  . Emotionally Abused: Not on file  . Physically Abused: Not on file  . Sexually Abused: Not on file    Current Outpatient Medications on File Prior to Visit  Medication Sig Dispense Refill  . Cholecalciferol (VITAMIN D3) 1.25 MG (50000 UT) CAPS Take 1 capsule by mouth daily. 12 capsule 0  . Dulaglutide (TRULICITY) 4.5 MG/0.5ML SOPN Inject 0.5 mLs (4.5 mg total) as directed once a week. 12 pen 3  . empagliflozin (JARDIANCE) 25 MG TABS tablet Take 1 tablet (25 mg total) by mouth daily. 90 tablet 1  . fenofibrate (TRICOR) 48 MG tablet TAKE 1 TABLET BY MOUTH EVERY DAY 90 tablet 1  . levothyroxine (SYNTHROID) 100 MCG tablet TAKE 1 TABLET BY MOUTH EVERY DAY 90 tablet  1  . loratadine (CLARITIN) 10 MG tablet Take 10 mg by mouth daily.    . metFORMIN (GLUCOPHAGE) 500 MG tablet TAKE 1 TABLET (500 MG TOTAL) BY MOUTH 2 (TWO) TIMES DAILY WITH A MEAL. 180 tablet 0  . Multiple Vitamins-Minerals (PRESERVISION AREDS 2) CAPS Take by mouth.    . Nabumetone (RELAFEN DS) 1000 MG TABS Take 2 tablets by mouth daily as needed. 180 tablet 1  . Olmesartan-amLODIPine-HCTZ 40-10-12.5 MG TABS TAKE 1 TABLET BY MOUTH EVERY DAY 90 tablet 0  . pioglitazone (ACTOS) 45 MG tablet Take 1 tablet (45 mg total) by mouth daily. 90 tablet 3  . Potassium Chloride (K+ POTASSIUM PO) Take by mouth.    . pravastatin (PRAVACHOL) 40 MG tablet TAKE 1 TABLET BY MOUTH EVERY DAY 90 tablet 1  . testosterone (ANDROGEL) 50 MG/5GM (1%) GEL PLACE 5 G ONTO THE SKIN DAILY. 150 g 5  . vitamin B-12 (CYANOCOBALAMIN) 1000 MCG tablet Take 2,000 mcg by mouth daily.    . [DISCONTINUED] levothyroxine (SYNTHROID) 100 MCG tablet Take 1 tablet (100 mcg total) by mouth daily. 90 tablet 1  . [DISCONTINUED] Olmesartan-amLODIPine-HCTZ 40-10-12.5 MG TABS Take 1 tablet by mouth daily. 90 tablet 0   No current facility-administered medications on file prior to visit.    Allergies  Allergen Reactions  . Crab [Shellfish Allergy] Anaphylaxis  . Iodine Anaphylaxis  . Lisinopril     cough  . Crestor [Rosuvastatin]     Muscle aches  . Marcelline Deist [Dapagliflozin] Other (See Comments)    Joint pain and weakness  . Iohexol Swelling  . Peanut-Containing Drug Products Itching    And whelps  . Aspirin Rash  . Codeine Rash  . Penicillins Rash    Has patient had a PCN reaction causing immediate rash, facial/tongue/throat swelling, SOB or lightheadedness with hypotension: Yes Has patient had a PCN reaction causing severe rash involving mucus membranes or skin necrosis: Yes Has patient had a PCN reaction that required hospitalization No Has patient had a PCN reaction occurring within the last 10 years: No If all of the above answers  are "NO", then may proceed with Cephalosporin use.     Family History  Problem Relation Age of Onset  . Hyperlipidemia Mother   . Hypertension Mother   . Stroke Father   . Kidney disease Father   . Diabetes Father   . Cancer Maternal Grandfather   . Anesthesia problems Neg Hx   . Hypotension Neg Hx   . Malignant hyperthermia Neg Hx   . Pseudochol deficiency Neg Hx     BP 133/70   Pulse 73  Ht 5\' 11"  (1.803 m)   Wt 213 lb (96.6 kg)   SpO2 99%   BMI 29.71 kg/m    Review of Systems     Objective:   Physical Exam VITAL SIGNS:  See vs page GENERAL: no distress Pulses: dorsalis pedis intact bilat.   MSK: no deformity of the feet CV: no leg edema Skin:  no ulcer on the feet.  normal color and temp on the feet.  Neuro: sensation is intact to touch on the feet.   A1c=6.7  Lab Results  Component Value Date   CREATININE 1.36 (H) 08/21/2019   BUN 20 08/21/2019   NA 137 08/21/2019   K 4.1 08/21/2019   CL 103 08/21/2019   CO2 25 08/21/2019   Lab Results  Component Value Date   PSA 0.46 02/10/2019   PSA 0.71 02/06/2018   PSA 0.24 12/27/2016       Assessment & Plan:  Type 2 DM: well-controlled  Patient Instructions  Please continue the same 3 diabetes medications.   check your blood sugar once a day.  vary the time of day when you check, between before the 3 meals, and at bedtime.  also check if you have symptoms of your blood sugar being too high or too low.  please keep a record of the readings and bring it to your next appointment here (or you can bring the meter itself).  You can write it on any piece of paper.  please call 12/29/2016 sooner if your blood sugar goes below 70, or if you have a lot of readings over 200. Please come back for a follow-up appointment in 4 months.

## 2019-11-14 NOTE — Patient Instructions (Addendum)
Please continue the same 3 diabetes medications.  check your blood sugar once a day.  vary the time of day when you check, between before the 3 meals, and at bedtime.  also check if you have symptoms of your blood sugar being too high or too low.  please keep a record of the readings and bring it to your next appointment here (or you can bring the meter itself).  You can write it on any piece of paper.  please call us sooner if your blood sugar goes below 70, or if you have a lot of readings over 200.   Please come back for a follow-up appointment in 4 months.   

## 2019-11-25 ENCOUNTER — Other Ambulatory Visit: Payer: Self-pay | Admitting: Internal Medicine

## 2019-11-25 DIAGNOSIS — I10 Essential (primary) hypertension: Secondary | ICD-10-CM

## 2019-11-25 DIAGNOSIS — E118 Type 2 diabetes mellitus with unspecified complications: Secondary | ICD-10-CM

## 2019-12-16 ENCOUNTER — Other Ambulatory Visit: Payer: Self-pay | Admitting: Internal Medicine

## 2019-12-16 DIAGNOSIS — E039 Hypothyroidism, unspecified: Secondary | ICD-10-CM

## 2019-12-26 ENCOUNTER — Ambulatory Visit (INDEPENDENT_AMBULATORY_CARE_PROVIDER_SITE_OTHER): Payer: BC Managed Care – PPO | Admitting: Plastic Surgery

## 2019-12-26 ENCOUNTER — Other Ambulatory Visit: Payer: Self-pay

## 2019-12-26 ENCOUNTER — Encounter: Payer: Self-pay | Admitting: Plastic Surgery

## 2019-12-26 VITALS — BP 136/88 | HR 74 | Temp 98.4°F | Ht 71.0 in | Wt 210.0 lb

## 2019-12-26 DIAGNOSIS — M6208 Separation of muscle (nontraumatic), other site: Secondary | ICD-10-CM

## 2019-12-26 NOTE — Progress Notes (Signed)
   Subjective:    Patient ID: Terry Harrington, male    DOB: [birth year], 64 y.o.   MRN: [id number]  Patient is a 64 year old black male here for evaluation of his abdomen.  He was evaluated several months ago for a gastric bulge.  He had a hernia repair as a child with no additional surgery.  He was concerned that he may have a hernia but per Dr. Sheliah Hatch none has been noted.  He complains of abdominal pain that is worse when he goes from a supine to standing position.  He has rectus diastases is 15 cm in size.  It is dramatic when he tries to sit up.  I do not feel a hernia today.  He is 5 feet 9 inches tall and weighs 210 pounds.  He has lost at least 5 pounds over the last several months.  His past medical history is positive for diabetes, scoliosis, hypothyroidism, hyperlipidemia and hypertension.  He is still tobacco free.  He is on medications for his diabetes.  He did not hear back from the [facility] but is willing to go.  He states that the pain is slightly better since decreasing his weight.       Review of Systems  Constitutional: Negative for activity change and appetite change.  Eyes: Negative.   Respiratory: Negative.  Negative for chest tightness and shortness of breath.   Cardiovascular: Negative for leg swelling.  Gastrointestinal: Positive for abdominal distention and abdominal pain.  Endocrine: Negative.   Musculoskeletal: Negative.   Neurological: Negative.   Hematological: Negative.   Psychiatric/Behavioral: Negative.        Objective:   Physical Exam Vitals and nursing note reviewed.  Constitutional:      Appearance: Normal appearance.  HENT:     Head: Normocephalic and atraumatic.     Nose: Nose normal.     Mouth/Throat:     Mouth: Mucous membranes are moist.  Eyes:     Extraocular Movements: Extraocular movements intact.  Cardiovascular:     Rate and Rhythm: Normal rate.     Pulses: Normal pulses.  Pulmonary:     Effort:  Pulmonary effort is normal. No respiratory distress.  Abdominal:     General: Abdomen is flat. There is distension.  Skin:    General: Skin is warm.     Capillary Refill: Capillary refill takes less than 2 seconds.  Neurological:     General: No focal deficit present.     Mental Status: He is alert and oriented to person, place, and time.  Psychiatric:        Mood and Affect: Mood normal.        Behavior: Behavior normal.        Thought Content: Thought content normal.          Assessment & Plan:     ICD-10-CM   1. Rectus diastasis  M62.08     Referral made again to [facility].  He will continue on a weight loss program.  We will see what we can work out for him regarding repair of rectus diastases.  Pictures were obtained of the patient and placed in the chart with the patient's or guardian's permission.

## 2019-12-28 ENCOUNTER — Encounter: Payer: Self-pay | Admitting: Plastic Surgery

## 2020-01-15 ENCOUNTER — Other Ambulatory Visit: Payer: Self-pay | Admitting: Endocrinology

## 2020-02-23 ENCOUNTER — Ambulatory Visit: Payer: BC Managed Care – PPO | Admitting: Internal Medicine

## 2020-02-25 ENCOUNTER — Other Ambulatory Visit: Payer: Self-pay | Admitting: Internal Medicine

## 2020-02-25 DIAGNOSIS — E785 Hyperlipidemia, unspecified: Secondary | ICD-10-CM

## 2020-02-27 ENCOUNTER — Other Ambulatory Visit: Payer: Self-pay | Admitting: Endocrinology

## 2020-03-02 ENCOUNTER — Ambulatory Visit (INDEPENDENT_AMBULATORY_CARE_PROVIDER_SITE_OTHER): Payer: Medicare HMO | Admitting: Internal Medicine

## 2020-03-02 ENCOUNTER — Encounter: Payer: Self-pay | Admitting: Internal Medicine

## 2020-03-02 ENCOUNTER — Other Ambulatory Visit: Payer: Self-pay

## 2020-03-02 VITALS — BP 126/64 | HR 74 | Temp 98.2°F | Resp 16 | Ht 71.0 in | Wt 218.0 lb

## 2020-03-02 DIAGNOSIS — E559 Vitamin D deficiency, unspecified: Secondary | ICD-10-CM | POA: Diagnosis not present

## 2020-03-02 DIAGNOSIS — Z0000 Encounter for general adult medical examination without abnormal findings: Secondary | ICD-10-CM | POA: Diagnosis not present

## 2020-03-02 DIAGNOSIS — I10 Essential (primary) hypertension: Secondary | ICD-10-CM

## 2020-03-02 DIAGNOSIS — E039 Hypothyroidism, unspecified: Secondary | ICD-10-CM

## 2020-03-02 DIAGNOSIS — N400 Enlarged prostate without lower urinary tract symptoms: Secondary | ICD-10-CM | POA: Diagnosis not present

## 2020-03-02 DIAGNOSIS — N1831 Chronic kidney disease, stage 3a: Secondary | ICD-10-CM

## 2020-03-02 DIAGNOSIS — E781 Pure hyperglyceridemia: Secondary | ICD-10-CM | POA: Diagnosis not present

## 2020-03-02 DIAGNOSIS — E785 Hyperlipidemia, unspecified: Secondary | ICD-10-CM

## 2020-03-02 DIAGNOSIS — Z860100 Personal history of colon polyps, unspecified: Secondary | ICD-10-CM

## 2020-03-02 DIAGNOSIS — E118 Type 2 diabetes mellitus with unspecified complications: Secondary | ICD-10-CM | POA: Diagnosis not present

## 2020-03-02 DIAGNOSIS — T50905A Adverse effect of unspecified drugs, medicaments and biological substances, initial encounter: Secondary | ICD-10-CM

## 2020-03-02 LAB — HEPATIC FUNCTION PANEL
ALT: 8 U/L (ref 0–53)
AST: 9 U/L (ref 0–37)
Albumin: 4.4 g/dL (ref 3.5–5.2)
Alkaline Phosphatase: 53 U/L (ref 39–117)
Bilirubin, Direct: 0.1 mg/dL (ref 0.0–0.3)
Total Bilirubin: 0.3 mg/dL (ref 0.2–1.2)
Total Protein: 7.4 g/dL (ref 6.0–8.3)

## 2020-03-02 LAB — BASIC METABOLIC PANEL
BUN: 23 mg/dL (ref 6–23)
CO2: 26 mEq/L (ref 19–32)
Calcium: 10.6 mg/dL — ABNORMAL HIGH (ref 8.4–10.5)
Chloride: 102 mEq/L (ref 96–112)
Creatinine, Ser: 1.49 mg/dL (ref 0.40–1.50)
GFR: 48.65 mL/min — ABNORMAL LOW (ref >60.00)
Glucose, Bld: 97 mg/dL (ref 70–99)
Potassium: 4.2 mEq/L (ref 3.5–5.1)
Sodium: 138 mEq/L (ref 135–145)

## 2020-03-02 LAB — LIPID PANEL
Cholesterol: 189 mg/dL (ref 0–200)
HDL: 65.2 mg/dL (ref >39.00)
LDL Cholesterol: 104 mg/dL — ABNORMAL HIGH (ref 0–99)
NonHDL: 124.04
Total CHOL/HDL Ratio: 3
Triglycerides: 101 mg/dL (ref 0.0–149.0)
VLDL: 20.2 mg/dL (ref 0.0–40.0)

## 2020-03-02 LAB — TSH: TSH: 1.23 u[IU]/mL (ref 0.35–4.50)

## 2020-03-02 LAB — URINALYSIS, ROUTINE W REFLEX MICROSCOPIC
Bilirubin Urine: NEGATIVE
Hgb urine dipstick: NEGATIVE
Ketones, ur: NEGATIVE
Leukocytes,Ua: NEGATIVE
Nitrite: NEGATIVE
RBC / HPF: NONE SEEN (ref 0–?)
Specific Gravity, Urine: 1.02 (ref 1.000–1.030)
Total Protein, Urine: NEGATIVE
Urine Glucose: >=1000 — AB
Urobilinogen, UA: 0.2 (ref 0.0–1.0)
pH: 5.5 (ref 5.0–8.0)

## 2020-03-02 LAB — MICROALBUMIN / CREATININE URINE RATIO
Creatinine,U: 115.1 mg/dL
Microalb Creat Ratio: 0.6 mg/g (ref 0.0–30.0)
Microalb, Ur: <0.7 mg/dL (ref 0.0–1.9)

## 2020-03-02 LAB — VITAMIN D 25 HYDROXY (VIT D DEFICIENCY, FRACTURES): VITD: 40.92 ng/mL (ref 30.00–100.00)

## 2020-03-02 LAB — PSA: PSA: 0.41 ng/mL (ref 0.10–4.00)

## 2020-03-02 LAB — HEMOGLOBIN A1C: Hgb A1c MFr Bld: 6.3 % (ref 4.6–6.5)

## 2020-03-02 NOTE — Progress Notes (Signed)
Subjective:  Patient ID: Terry Harrington, male    DOB: [birth year]  Age: 65 y.o. MRN: [id number]  CC: Annual Exam, Hypertension, Hyperlipidemia, Diabetes, and Hypothyroidism  This visit occurred during the SARS-CoV-2 public health emergency.  Safety protocols were in place, including screening questions prior to the visit, additional usage of staff PPE, and extensive cleaning of exam room while observing appropriate contact time as indicated for disinfecting solutions.    HPI Terry Harrington presents for a CPX.  He tells me his blood pressure has been well controlled.  He is active and denies any recent episodes of chest pain, shortness of breath, palpitations, edema, fatigue, dizziness, or lightheadedness.  Outpatient Medications Prior to Visit  Medication Sig Dispense Refill   Cholecalciferol (VITAMIN D3) 1.25 MG (50000 UT) CAPS Take 1 capsule by mouth daily. 12 capsule 0   Dulaglutide (TRULICITY) 4.5 MG/0.5ML SOPN Inject 0.5 mLs (4.5 mg total) as directed once a week. 12 pen 3   empagliflozin (JARDIANCE) 25 MG TABS tablet Take 1 tablet (25 mg total) by mouth daily. 90 tablet 1   fenofibrate (TRICOR) 48 MG tablet TAKE 1 TABLET BY MOUTH EVERY DAY 90 tablet 1   levothyroxine (SYNTHROID) 100 MCG tablet TAKE 1 TABLET BY MOUTH EVERY DAY 90 tablet 1   loratadine (CLARITIN) 10 MG tablet Take 10 mg by mouth daily.     metFORMIN (GLUCOPHAGE) 500 MG tablet TAKE 1 TABLET (500 MG TOTAL) BY MOUTH 2 (TWO) TIMES DAILY WITH A MEAL. 180 tablet 1   Multiple Vitamins-Minerals (PRESERVISION AREDS 2) CAPS Take by mouth.     pioglitazone (ACTOS) 45 MG tablet TAKE 1 TABLET BY MOUTH EVERY DAY 90 tablet 3   pravastatin (PRAVACHOL) 40 MG tablet TAKE 1 TABLET BY MOUTH EVERY DAY 90 tablet 1   vitamin B-12 (CYANOCOBALAMIN) 1000 MCG tablet Take 2,000 mcg by mouth daily.     Nabumetone (RELAFEN DS) 1000 MG TABS Take 2 tablets by mouth daily as needed. 180 tablet 1   Olmesartan-amLODIPine-HCTZ  40-10-12.5 MG TABS TAKE 1 TABLET BY MOUTH EVERY DAY 90 tablet 0   Potassium Chloride (K+ POTASSIUM PO) Take by mouth.     testosterone (ANDROGEL) 50 MG/5GM (1%) GEL PLACE 5 G ONTO THE SKIN DAILY. 150 g 5   No facility-administered medications prior to visit.    ROS Review of Systems  Constitutional: Negative.  Negative for appetite change, diaphoresis, fatigue and unexpected weight change.  HENT: Negative.   Eyes: Negative.   Respiratory: Negative for cough, chest tightness, shortness of breath and wheezing.   Cardiovascular: Negative for chest pain, palpitations and leg swelling.  Gastrointestinal: Negative for abdominal pain, constipation, diarrhea, nausea and vomiting.  Endocrine: Negative.  Negative for cold intolerance and heat intolerance.  Genitourinary: Negative.  Negative for difficulty urinating, hematuria, scrotal swelling and testicular pain.  Musculoskeletal: Positive for arthralgias. Negative for myalgias and neck pain.  Skin: Negative for color change and pallor.  Neurological: Negative.  Negative for dizziness, weakness, light-headedness, numbness and headaches.  Hematological: Negative for adenopathy. Does not bruise/bleed easily.  Psychiatric/Behavioral: Negative.     Objective:  BP 126/64    Pulse 74    Temp 98.2 F (36.8 C) (Oral)    Resp 16    Ht 5\' 11"  (1.803 m)    Wt 218 lb (98.9 kg)    SpO2 96%    BMI 30.40 kg/m   BP Readings from Last 3 Encounters:  03/02/20 126/64  12/26/19 136/88  11/14/19 133/70  Wt Readings from Last 3 Encounters:  03/02/20 218 lb (98.9 kg)  12/26/19 210 lb (95.3 kg)  11/14/19 213 lb (96.6 kg)    Physical Exam Vitals reviewed.  Constitutional:      Appearance: Normal appearance.  HENT:     Nose: Nose normal.     Mouth/Throat:     Mouth: Mucous membranes are moist.  Eyes:     General: No scleral icterus.    Conjunctiva/sclera: Conjunctivae normal.  Cardiovascular:     Rate and Rhythm: Normal rate and regular rhythm.      Heart sounds: No murmur heard. No gallop.   Pulmonary:     Effort: Pulmonary effort is normal.     Breath sounds: No stridor. No wheezing, rhonchi or rales.  Abdominal:     General: Abdomen is flat. Bowel sounds are normal. There is no distension.     Palpations: Abdomen is soft. There is no fluid wave, hepatomegaly, splenomegaly or mass.     Hernia: There is no hernia in the left inguinal area or right inguinal area.  Genitourinary:    Pubic Area: No rash.      Penis: Normal.      Testes: Normal.     Epididymis:     Right: Normal.     Left: Normal.     Prostate: Enlarged. Not tender and no nodules present.     Rectum: Normal. Guaiac result negative. No mass, tenderness, anal fissure, external hemorrhoid or internal hemorrhoid. Normal anal tone.  Musculoskeletal:        General: Normal range of motion.     Cervical back: Neck supple.     Right lower leg: No edema.     Left lower leg: No edema.  Lymphadenopathy:     Cervical: No cervical adenopathy.     Lower Body: No right inguinal adenopathy. No left inguinal adenopathy.  Skin:    General: Skin is warm and dry.     Coloration: Skin is not pale.  Neurological:     General: No focal deficit present.     Mental Status: He is alert and oriented to person, place, and time. Mental status is at baseline.  Psychiatric:        Mood and Affect: Mood normal.        Behavior: Behavior normal.     Lab Results  Component Value Date   WBC 5.4 02/10/2019   HGB 14.5 02/10/2019   HCT 43.2 02/10/2019   PLT 285.0 02/10/2019   GLUCOSE 97 03/02/2020   CHOL 189 03/02/2020   TRIG 101.0 03/02/2020   HDL 65.20 03/02/2020   LDLDIRECT 86.0 02/10/2019   LDLCALC 104 (H) 03/02/2020   ALT 8 03/02/2020   AST 9 03/02/2020   NA 138 03/02/2020   K 4.2 03/02/2020   CL 102 03/02/2020   CREATININE 1.49 03/02/2020   BUN 23 03/02/2020   CO2 26 03/02/2020   TSH 1.23 03/02/2020   PSA 0.41 03/02/2020   HGBA1C 6.3 03/02/2020   MICROALBUR <0.7  03/02/2020    DG HIP UNILAT WITH PELVIS 2-3 VIEWS LEFT  Result Date: 05/13/2019 CLINICAL DATA:  Pain EXAM: DG HIP (WITH OR WITHOUT PELVIS) 2-3V LEFT COMPARISON:  None. FINDINGS: Frontal pelvis as well as weightbearing frontal and supine lateral left hip images obtained. There is no appreciable fracture or dislocation. There is mild symmetric narrowing of each hip joint. No erosive change. Sacroiliac joints appear unremarkable bilaterally. There is lower lumbar levoscoliosis. IMPRESSION: Symmetric narrowing hip joints.  No fracture or dislocation. Lower lumbar levoscoliosis. Electronically Signed   By: Bretta Bang III M.D.   On: 05/13/2019 08:32    Assessment & Plan:   Ire[place]wa was seen today for annual exam, hypertension, hyperlipidemia, diabetes and hypothyroidism.  Diagnoses and all orders for this visit:  Primary hypertension- His blood pressure is very well controlled.  He has developed hypercalcemia so I recommended that he stop taking the thiazide diuretic.  Will control his blood pressure with the CCB and ARB. -     Basic metabolic panel; Future -     Basic metabolic panel -     amlodipine-olmesartan (AZOR) 10-20 MG tablet; Take 1 tablet by mouth daily.  Hyperlipidemia with target LDL less than 100- He has achieved his LDL goal and is doing well on the statin. -     Lipid panel; Future -     Hepatic function panel; Future -     Hepatic function panel -     Lipid panel  Pure hypertriglyceridemia- His triglycerides are normal now.  Vitamin D deficiency disease -     VITAMIN D 25 Hydroxy (Vit-D Deficiency, Fractures); Future -     VITAMIN D 25 Hydroxy (Vit-D Deficiency, Fractures)  Routine general medical examination at a health care facility- Exam completed, labs reviewed, vaccines reviewed and updated, cancer screenings addressed, patient education was given.  Type II diabetes mellitus with manifestations (HCC)- His blood sugar is very well controlled. -     Microalbumin /  creatinine urine ratio; Future -     Hemoglobin A1c; Future -     Hemoglobin A1c -     Microalbumin / creatinine urine ratio -     amlodipine-olmesartan (AZOR) 10-20 MG tablet; Take 1 tablet by mouth daily.  Hypothyroidism (acquired)- His TSH is in the normal range.  He will remain on the current dose of T4. -     TSH; Future -     TSH  Benign prostatic hyperplasia without lower urinary tract symptoms- He has no symptoms that need to be treated and his PSA is normal. -     PSA; Future -     Urinalysis, Routine w reflex microscopic; Future -     Urinalysis, Routine w reflex microscopic -     PSA  Personal history of colonic polyps -     Ambulatory referral to Gastroenterology  Hypercalcemia due to a drug- See above.  Stage 3a chronic kidney disease (HCC)- His blood pressure and blood sugar are well controlled.  Will discontinue the thiazide diuretic. -     amlodipine-olmesartan (AZOR) 10-20 MG tablet; Take 1 tablet by mouth daily.   I have discontinued Dabney Schanz. Kiger's Potassium Chloride (K+ POTASSIUM PO), Relafen DS, testosterone, and Olmesartan-amLODIPine-HCTZ. I am also having him start on amlodipine-olmesartan. Additionally, I am having him maintain his vitamin B-12, loratadine, Trulicity, empagliflozin, Vitamin D3, fenofibrate, PreserVision AREDS 2, levothyroxine, metFORMIN, pravastatin, and pioglitazone.  Meds ordered this encounter  Medications   amlodipine-olmesartan (AZOR) 10-20 MG tablet    Sig: Take 1 tablet by mouth daily.    Dispense:  90 tablet    Refill:  1     Follow-up: Return in about 6 months (around 08/30/2020).  Sanda Linger, MD

## 2020-03-02 NOTE — Patient Instructions (Signed)

## 2020-03-03 MED ORDER — AMLODIPINE-OLMESARTAN 10-20 MG PO TABS: 1.0000 | ORAL_TABLET | Freq: Every day | ORAL | 1 refills | Status: DC

## 2020-03-09 ENCOUNTER — Other Ambulatory Visit: Payer: Self-pay | Admitting: Internal Medicine

## 2020-03-09 DIAGNOSIS — E118 Type 2 diabetes mellitus with unspecified complications: Secondary | ICD-10-CM

## 2020-03-10 ENCOUNTER — Other Ambulatory Visit: Payer: Self-pay | Admitting: Internal Medicine

## 2020-03-10 ENCOUNTER — Encounter: Payer: Self-pay | Admitting: Internal Medicine

## 2020-03-10 DIAGNOSIS — R42 Dizziness and giddiness: Secondary | ICD-10-CM

## 2020-03-10 MED ORDER — MECLIZINE HCL 25 MG PO TABS
25.0000 mg | ORAL_TABLET | Freq: Three times a day (TID) | ORAL | 3 refills | Status: DC | PRN
Start: 2020-03-10 — End: 2020-09-24

## 2020-03-19 ENCOUNTER — Ambulatory Visit: Payer: Medicare HMO | Admitting: Endocrinology

## 2020-03-19 ENCOUNTER — Encounter: Payer: Self-pay | Admitting: Endocrinology

## 2020-03-19 ENCOUNTER — Encounter: Payer: Self-pay | Admitting: Internal Medicine

## 2020-03-19 ENCOUNTER — Other Ambulatory Visit: Payer: Self-pay | Admitting: Internal Medicine

## 2020-03-19 ENCOUNTER — Other Ambulatory Visit: Payer: Self-pay

## 2020-03-19 VITALS — BP 130/72 | HR 77 | Ht 71.0 in | Wt 214.4 lb

## 2020-03-19 DIAGNOSIS — E559 Vitamin D deficiency, unspecified: Secondary | ICD-10-CM

## 2020-03-19 DIAGNOSIS — E118 Type 2 diabetes mellitus with unspecified complications: Secondary | ICD-10-CM | POA: Diagnosis not present

## 2020-03-19 LAB — POCT GLYCOSYLATED HEMOGLOBIN (HGB A1C): Hemoglobin A1C: 6.2 % — AB (ref 4.0–5.6)

## 2020-03-19 MED ORDER — VITAMIN D3 1.25 MG (50000 UT) PO CAPS: 1.0000 | ORAL_CAPSULE | Freq: Every day | ORAL | 0 refills | Status: DC

## 2020-03-19 NOTE — Patient Instructions (Addendum)
Please continue the same 3 diabetes medications.   check your blood sugar once a day.  vary the time of day when you check, between before the 3 meals, and at bedtime.  also check if you have symptoms of your blood sugar being too high or too low.  please keep a record of the readings and bring it to your next appointment here (or you can bring the meter itself).  You can write it on any piece of paper.  please call us sooner if your blood sugar goes below 70, or if you have a lot of readings over 200. The testosterone gel was sent on 03/02/20.  Please pick up and start taking the prescription.   Please come back for a follow-up appointment in 4 months.

## 2020-03-19 NOTE — Progress Notes (Signed)
Subjective:    Patient ID: Terry Harrington, male    DOB: 07-03-1955, 65 y.o.   MRN: [id number]  HPI Pt returns for f/u of diabetes mellitus:  DM type: 2 Dx'ed: 2010 Complications: stage 3 CRI Therapy: trulicity and 3 oral meds.  DKA: never.  Severe hypoglycemia: never.  Pancreatitis: never Other: he took insulin 2015-2017; in early 2017, he was hospitalized for nonketotic hyperosmolar hyperglycemic state, due to missing insulin doses, he did not tolerate farxiga (arthralgias); he works M-F, day shift.  He says cbg's are higher on weekends.  Interval history: pt says cbg's are well-controlled.   Pt also has primary hypogonadism (dx'ed 2018; he has lifelong infertility, but karyotype was normal; he took androgel 2012-2014; testosterone level was low, but he is on max safe dosage (50 mg/d)).  He has not recently taken the androgel.  Past Medical History:  Diagnosis Date  . Diabetes mellitus   . GERD (gastroesophageal reflux disease)   . Heart murmur   . Hyperlipidemia   . Hypertension    on meds  . Hypothyroidism    on meds    Past Surgical History:  Procedure Laterality Date  . DENTAL SURGERY     several dental procedure approx 10 between 1999-2012  . HERNIA REPAIR     as a child  . SHOULDER ARTHROSCOPY  05/31/2011   Procedure: ARTHROSCOPY SHOULDER;  Surgeon: Kennieth Rad, MD;  Location: [facility] OR;  Service: Orthopedics;  Laterality: Left;  Shoulder Acromioplasty Left  . SHOULDER SURGERY  05/31/2011    impingment left    Social History   Socioeconomic History  . Marital status: Divorced    Spouse name: Not on file  . Number of children: Not on file  . Years of education: Not on file  . Highest education level: Not on file  Occupational History  . Occupation: Holiday representative  Tobacco Use  . Smoking status: Former Smoker    Packs/day: 1.50    Years: 26.00    Pack years: 39.00    Types: Cigarettes    Quit date: 03/02/2008    Years since quitting: 12.0  . Smokeless  tobacco: Former Neurosurgeon    Quit date: 05/25/2008  Substance and Sexual Activity  . Alcohol use: Yes    Alcohol/week: 0.0 standard drinks    Comment: rarely  . Drug use: No  . Sexual activity: Not Currently  Other Topics Concern  . Not on file  Social History Narrative  . Not on file   Social Determinants of Health   Financial Resource Strain: Not on file  Food Insecurity: Not on file  Transportation Needs: Not on file  Physical Activity: Not on file  Stress: Not on file  Social Connections: Not on file  Intimate Partner Violence: Not on file    Current Outpatient Medications on File Prior to Visit  Medication Sig Dispense Refill  . amlodipine-olmesartan (AZOR) 10-20 MG tablet Take 1 tablet by mouth daily. 90 tablet 1  . Dulaglutide (TRULICITY) 4.5 MG/0.5ML SOPN Inject 0.5 mLs (4.5 mg total) as directed once a week. 12 pen 3  . fenofibrate (TRICOR) 48 MG tablet TAKE 1 TABLET BY MOUTH EVERY DAY 90 tablet 1  . JARDIANCE 25 MG TABS tablet TAKE 1 TABLET BY MOUTH EVERY DAY 90 tablet 1  . levothyroxine (SYNTHROID) 100 MCG tablet TAKE 1 TABLET BY MOUTH EVERY DAY 90 tablet 1  . loratadine (CLARITIN) 10 MG tablet Take 10 mg by mouth daily.    Marland Kitchen  meclizine (ANTIVERT) 25 MG tablet Take 1 tablet (25 mg total) by mouth 3 (three) times daily as needed for dizziness. 90 tablet 3  . metFORMIN (GLUCOPHAGE) 500 MG tablet TAKE 1 TABLET (500 MG TOTAL) BY MOUTH 2 (TWO) TIMES DAILY WITH A MEAL. 180 tablet 1  . Multiple Vitamins-Minerals (PRESERVISION AREDS 2) CAPS Take by mouth.    . pioglitazone (ACTOS) 45 MG tablet TAKE 1 TABLET BY MOUTH EVERY DAY 90 tablet 3  . pravastatin (PRAVACHOL) 40 MG tablet TAKE 1 TABLET BY MOUTH EVERY DAY 90 tablet 1  . vitamin B-12 (CYANOCOBALAMIN) 1000 MCG tablet Take 2,000 mcg by mouth daily.     No current facility-administered medications on file prior to visit.    Allergies  Allergen Reactions  . Crab [Shellfish Allergy] Anaphylaxis  . Iodine Anaphylaxis  .  Lisinopril     cough  . Crestor [Rosuvastatin]     Muscle aches  . Marcelline Deist [Dapagliflozin] Other (See Comments)    Joint pain and weakness  . Iohexol Swelling  . Peanut-Containing Drug Products Itching    And whelps  . Aspirin Rash  . Codeine Rash  . Penicillins Rash    Has patient had a PCN reaction causing immediate rash, facial/tongue/throat swelling, SOB or lightheadedness with hypotension: Yes Has patient had a PCN reaction causing severe rash involving mucus membranes or skin necrosis: Yes Has patient had a PCN reaction that required hospitalization No Has patient had a PCN reaction occurring within the last 10 years: No If all of the above answers are "NO", then may proceed with Cephalosporin use.     Family History  Problem Relation Age of Onset  . Hyperlipidemia Mother   . Hypertension Mother   . Stroke Father   . Kidney disease Father   . Diabetes Father   . Cancer Maternal Grandfather   . Anesthesia problems Neg Hx   . Hypotension Neg Hx   . Malignant hyperthermia Neg Hx   . Pseudochol deficiency Neg Hx     BP 130/72 (BP Location: Right Arm, Patient Position: Sitting, Cuff Size: Normal)   Pulse 77   Ht 5\' 11"  (1.803 m)   Wt 214 lb 6.4 oz (97.3 kg)   SpO2 97%   BMI 29.90 kg/m    Review of Systems Denies decreased urinary stream    Objective:   Physical Exam VITAL SIGNS:  See vs page GENERAL: no distress Pulses: dorsalis pedis intact bilat.   MSK: no deformity of the feet CV: no leg edema Skin:  no ulcer on the feet.  normal color and temp on the feet. Neuro: sensation is intact to touch on the feet  Lab Results  Component Value Date   HGBA1C 6.2 (A) 03/19/2020   Lab Results  Component Value Date   PSA 0.41 03/02/2020   PSA 0.46 02/10/2019   PSA 0.71 02/06/2018        Assessment & Plan:  Type 2 DM, with stage 3 DRI: well-controlled.     Patient Instructions  Please continue the same 3 diabetes medications.   check your blood sugar  once a day.  vary the time of day when you check, between before the 3 meals, and at bedtime.  also check if you have symptoms of your blood sugar being too high or too low.  please keep a record of the readings and bring it to your next appointment here (or you can bring the meter itself).  You can write it on any piece  of paper.  please call us sooner if your blood sugar goes below 70, or if you have a lot of readings over 200. The testosterone gel was sent on 03/02/20.  Please pick up and start taking the prescription.   Please come back for a follow-up appointment in 4 months.

## 2020-04-18 ENCOUNTER — Other Ambulatory Visit: Payer: Self-pay | Admitting: Internal Medicine

## 2020-04-18 DIAGNOSIS — E559 Vitamin D deficiency, unspecified: Secondary | ICD-10-CM

## 2020-04-19 MED ORDER — VITAMIN D3 1.25 MG (50000 UT) PO CAPS: 1.0000 | ORAL_CAPSULE | Freq: Every day | ORAL | 0 refills | Status: DC

## 2020-04-26 ENCOUNTER — Other Ambulatory Visit: Payer: Self-pay

## 2020-04-27 ENCOUNTER — Other Ambulatory Visit: Payer: Self-pay | Admitting: Internal Medicine

## 2020-04-27 ENCOUNTER — Other Ambulatory Visit: Payer: Self-pay

## 2020-04-27 ENCOUNTER — Encounter: Payer: Self-pay | Admitting: Internal Medicine

## 2020-04-27 ENCOUNTER — Ambulatory Visit (INDEPENDENT_AMBULATORY_CARE_PROVIDER_SITE_OTHER): Payer: Medicare HMO | Admitting: Internal Medicine

## 2020-04-27 ENCOUNTER — Ambulatory Visit (INDEPENDENT_AMBULATORY_CARE_PROVIDER_SITE_OTHER): Payer: Medicare HMO

## 2020-04-27 ENCOUNTER — Telehealth: Payer: Self-pay | Admitting: Internal Medicine

## 2020-04-27 VITALS — BP 136/70 | HR 73 | Temp 98.0°F | Ht 71.0 in | Wt 214.8 lb

## 2020-04-27 DIAGNOSIS — M25511 Pain in right shoulder: Secondary | ICD-10-CM

## 2020-04-27 DIAGNOSIS — G8929 Other chronic pain: Secondary | ICD-10-CM

## 2020-04-27 DIAGNOSIS — E559 Vitamin D deficiency, unspecified: Secondary | ICD-10-CM

## 2020-04-27 DIAGNOSIS — I10 Essential (primary) hypertension: Secondary | ICD-10-CM | POA: Diagnosis not present

## 2020-04-27 LAB — BASIC METABOLIC PANEL
BUN: 13 mg/dL (ref 6–23)
CO2: 27 mEq/L (ref 19–32)
Calcium: 10.1 mg/dL (ref 8.4–10.5)
Chloride: 106 mEq/L (ref 96–112)
Creatinine, Ser: 1.07 mg/dL (ref 0.40–1.50)
GFR: 72.3 mL/min (ref >60.00)
Glucose, Bld: 109 mg/dL — ABNORMAL HIGH (ref 70–99)
Potassium: 4.4 mEq/L (ref 3.5–5.1)
Sodium: 139 mEq/L (ref 135–145)

## 2020-04-27 LAB — MAGNESIUM: Magnesium: 1.9 mg/dL (ref 1.5–2.5)

## 2020-04-27 LAB — PHOSPHORUS: Phosphorus: 3.1 mg/dL (ref 2.3–4.6)

## 2020-04-27 MED ORDER — VITAMIN D3 1.25 MG (50000 UT) PO CAPS: 1.0000 | ORAL_CAPSULE | ORAL | 0 refills | Status: DC

## 2020-04-27 NOTE — Telephone Encounter (Signed)
Please clarify directions. I will correct and resend.

## 2020-04-27 NOTE — Telephone Encounter (Signed)
  Cholecalciferol (VITAMIN D3) 1.25 MG (50000 UT) CAPS [facility] calling, states that they cant fill it with the current directions, wondering if it is supposed to be once a week and not once daily

## 2020-04-27 NOTE — Progress Notes (Signed)
Subjective:  Patient ID: Terry Harrington, male    DOB: 05-13-1955  Age: 65 y.o. MRN: [id number]  CC: Follow-up (Patient c/o having pain and decreased ROM in right shoulder x a few months)  This visit occurred during the SARS-CoV-2 public health emergency.  Safety protocols were in place, including screening questions prior to the visit, additional usage of staff PPE, and extensive cleaning of exam room while observing appropriate contact time as indicated for disinfecting solutions.    HPI Terry Harrington presents for f/up -  He complains of chronic, worsening right shoulder pain with decreasing ROM.  He denies any recent trauma or injury.  He is getting adequate symptom relief with Tylenol.  He tells me his blood pressure has been well controlled.  He denies chest pain, shortness of breath, palpitations, edema, or fatigue.  Outpatient Medications Prior to Visit  Medication Sig Dispense Refill  . amlodipine-olmesartan (AZOR) 10-20 MG tablet Take 1 tablet by mouth daily. 90 tablet 1  . Dulaglutide (TRULICITY) 4.5 MG/0.5ML SOPN Inject 0.5 mLs (4.5 mg total) as directed once a week. 12 pen 3  . fenofibrate (TRICOR) 48 MG tablet TAKE 1 TABLET BY MOUTH EVERY DAY 90 tablet 1  . JARDIANCE 25 MG TABS tablet TAKE 1 TABLET BY MOUTH EVERY DAY 90 tablet 1  . levothyroxine (SYNTHROID) 100 MCG tablet TAKE 1 TABLET BY MOUTH EVERY DAY 90 tablet 1  . loratadine (CLARITIN) 10 MG tablet Take 10 mg by mouth daily.    . meclizine (ANTIVERT) 25 MG tablet Take 1 tablet (25 mg total) by mouth 3 (three) times daily as needed for dizziness. 90 tablet 3  . metFORMIN (GLUCOPHAGE) 500 MG tablet TAKE 1 TABLET (500 MG TOTAL) BY MOUTH 2 (TWO) TIMES DAILY WITH A MEAL. 180 tablet 1  . Multiple Vitamins-Minerals (PRESERVISION AREDS 2) CAPS Take by mouth.    . pioglitazone (ACTOS) 45 MG tablet TAKE 1 TABLET BY MOUTH EVERY DAY 90 tablet 3  . pravastatin (PRAVACHOL) 40 MG tablet TAKE 1 TABLET BY MOUTH EVERY DAY 90 tablet 1   . vitamin B-12 (CYANOCOBALAMIN) 1000 MCG tablet Take 2,000 mcg by mouth daily.    . Cholecalciferol (VITAMIN D3) 1.25 MG (50000 UT) CAPS Take 1 capsule by mouth daily. 4 capsule 0   No facility-administered medications prior to visit.    ROS Review of Systems  Constitutional: Negative.   HENT: Negative.   Respiratory: Negative for cough, chest tightness, shortness of breath and wheezing.   Cardiovascular: Negative for chest pain, palpitations and leg swelling.  Gastrointestinal: Negative for abdominal pain, constipation, diarrhea, nausea and vomiting.  Genitourinary: Negative.  Negative for difficulty urinating.  Musculoskeletal: Positive for arthralgias. Negative for back pain and myalgias.  Skin: Negative.  Negative for color change and pallor.  Neurological: Negative.   Hematological: Negative for adenopathy. Does not bruise/bleed easily.  Psychiatric/Behavioral: Negative.     Objective:  BP 136/70 (BP Location: Left Arm, Patient Position: Sitting, Cuff Size: Large)   Pulse 73   Temp 98 F (36.7 C) (Oral)   Ht 5\' 11"  (1.803 m)   Wt 214 lb 12.8 oz (97.4 kg)   SpO2 100%   BMI 29.96 kg/m   BP Readings from Last 3 Encounters:  04/27/20 136/70  03/19/20 130/72  03/02/20 126/64    Wt Readings from Last 3 Encounters:  04/27/20 214 lb 12.8 oz (97.4 kg)  03/19/20 214 lb 6.4 oz (97.3 kg)  03/02/20 218 lb (98.9 kg)  Physical Exam Vitals reviewed.  HENT:     Nose: Nose normal.  Eyes:     General: No scleral icterus.    Conjunctiva/sclera: Conjunctivae normal.  Cardiovascular:     Rate and Rhythm: Normal rate and regular rhythm.     Heart sounds: No murmur heard.   Pulmonary:     Effort: Pulmonary effort is normal.     Breath sounds: No stridor. No wheezing, rhonchi or rales.  Abdominal:     General: Abdomen is flat.     Tenderness: There is no abdominal tenderness. There is no guarding.     Hernia: No hernia is present.  Musculoskeletal:        General: No  swelling or tenderness.     Right shoulder: No swelling, deformity, effusion, tenderness or bony tenderness. Decreased range of motion.     Left shoulder: Normal.     Cervical back: Neck supple.     Right lower leg: No edema.     Left lower leg: No edema.  Lymphadenopathy:     Cervical: No cervical adenopathy.  Skin:    General: Skin is warm and dry.  Neurological:     General: No focal deficit present.     Mental Status: He is alert and oriented to person, place, and time.  Psychiatric:        Mood and Affect: Mood normal.        Behavior: Behavior normal.     Lab Results  Component Value Date   WBC 5.4 02/10/2019   HGB 14.5 02/10/2019   HCT 43.2 02/10/2019   PLT 285.0 02/10/2019   GLUCOSE 109 (H) 04/27/2020   CHOL 189 03/02/2020   TRIG 101.0 03/02/2020   HDL 65.20 03/02/2020   LDLDIRECT 86.0 02/10/2019   LDLCALC 104 (H) 03/02/2020   ALT 8 03/02/2020   AST 9 03/02/2020   NA 139 04/27/2020   K 4.4 04/27/2020   CL 106 04/27/2020   CREATININE 1.07 04/27/2020   BUN 13 04/27/2020   CO2 27 04/27/2020   TSH 1.23 03/02/2020   PSA 0.41 03/02/2020   HGBA1C 6.2 (A) 03/19/2020   MICROALBUR <0.7 03/02/2020    DG HIP UNILAT WITH PELVIS 2-3 VIEWS LEFT  Result Date: 05/13/2019 CLINICAL DATA:  Pain EXAM: DG HIP (WITH OR WITHOUT PELVIS) 2-3V LEFT COMPARISON:  None. FINDINGS: Frontal pelvis as well as weightbearing frontal and supine lateral left hip images obtained. There is no appreciable fracture or dislocation. There is mild symmetric narrowing of each hip joint. No erosive change. Sacroiliac joints appear unremarkable bilaterally. There is lower lumbar levoscoliosis. IMPRESSION: Symmetric narrowing hip joints. No fracture or dislocation. Lower lumbar levoscoliosis. Electronically Signed   By: Bretta Bang III M.D.   On: 05/13/2019 08:32   DG Shoulder Right  Result Date: 04/27/2020 CLINICAL DATA:  Right shoulder pain with limited range of motion for 3-4 months. EXAM: RIGHT  SHOULDER - 2+ VIEW COMPARISON:  None. FINDINGS: No acute fracture or dislocation is identified. There is mild marginal spurring at the acromioclavicular joint. The glenohumeral joint is unremarkable. The soft tissues are unremarkable. IMPRESSION: Mild acromioclavicular osteoarthrosis. Electronically Signed   By: Sebastian Ache M.D.   On: 04/27/2020 10:34    Assessment & Plan:   Terry Harrington was seen today for follow-up.  Diagnoses and all orders for this visit:  Chronic right shoulder pain-  -     DG Shoulder Right; Future -     Ambulatory referral to Sports Medicine  Hypercalcemia -  Basic metabolic panel; Future -     Phosphorus; Future -     Magnesium; Future -     PTH, intact and calcium; Future -     PTH, intact and calcium -     Magnesium -     Phosphorus -     Basic metabolic panel   I have changed Terry Harrington's Vitamin D3. I am also having him maintain his vitamin B-12, loratadine, Trulicity, fenofibrate, PreserVision AREDS 2, levothyroxine, metFORMIN, pravastatin, pioglitazone, amlodipine-olmesartan, Jardiance, and meclizine.  Meds ordered this encounter  Medications  . Cholecalciferol (VITAMIN D3) 1.25 MG (50000 UT) CAPS    Sig: Take 1 capsule by mouth once a week.    Dispense:  12 capsule    Refill:  0     Follow-up: Return if symptoms worsen or fail to improve.  Sanda Linger, MD

## 2020-04-27 NOTE — Patient Instructions (Signed)
Shoulder Pain Many things can cause shoulder pain, including:  An injury to the shoulder.  Overuse of the shoulder.  Arthritis. The source of the pain can be:  Inflammation.  An injury to the shoulder joint.  An injury to a tendon, ligament, or bone. Follow these instructions at home: Pay attention to changes in your symptoms. Let your health care provider know about them. Follow these instructions to relieve your pain. If you have a sling:  Wear the sling as told by your health care provider. Remove it only as told by your health care provider.  Loosen the sling if your fingers tingle, become numb, or turn cold and blue.  Keep the sling clean.  If the sling is not waterproof: ? Do not let it get wet. Remove it to shower or bathe.  Move your arm as little as possible, but keep your hand moving to prevent swelling. Managing pain, stiffness, and swelling  If directed, put ice on the painful area: ? Put ice in a plastic bag. ? Place a towel between your skin and the bag. ? Leave the ice on for 20 minutes, 2-3 times per day. Stop applying ice if it does not help with the pain.  Squeeze a soft ball or a foam pad as much as possible. This helps to keep the shoulder from swelling. It also helps to strengthen the arm.   General instructions  Take over-the-counter and prescription medicines only as told by your health care provider.  Keep all follow-up visits as told by your health care provider. This is important. Contact a health care provider if:  Your pain gets worse.  Your pain is not relieved with medicines.  New pain develops in your arm, hand, or fingers. Get help right away if:  Your arm, hand, or fingers: ? Tingle. ? Become numb. ? Become swollen. ? Become painful. ? Turn white or blue. Summary  Shoulder pain can be caused by an injury, overuse, or arthritis.  Pay attention to changes in your symptoms. Let your health care provider know about  them.  This condition may be treated with a sling, ice, and pain medicines.  Contact your health care provider if the pain gets worse or new pain develops. Get help right away if your arm, hand, or fingers tingle or become numb, swollen, or painful.  Keep all follow-up visits as told by your health care provider. This is important. This information is not intended to replace advice given to you by your health care provider. Make sure you discuss any questions you have with your health care provider. Document Revised: 07/10/2017 Document Reviewed: 07/10/2017 Elsevier Patient Education  2021 Elsevier Inc.  

## 2020-04-28 NOTE — Assessment & Plan Note (Signed)
Plain films are consistent with arthritis in the [facility] joint.  I have asked him to see sports medicine.

## 2020-04-28 NOTE — Assessment & Plan Note (Signed)
His calcium level is normal now.  Will screen for secondary causes of hypercalcemia.

## 2020-04-28 NOTE — Assessment & Plan Note (Signed)
His blood pressure is adequately well controlled. 

## 2020-04-29 ENCOUNTER — Ambulatory Visit: Payer: Self-pay

## 2020-04-29 ENCOUNTER — Other Ambulatory Visit: Payer: Self-pay

## 2020-04-29 ENCOUNTER — Ambulatory Visit: Payer: Medicare HMO | Admitting: Family Medicine

## 2020-04-29 VITALS — BP 148/94 | HR 78 | Ht 71.0 in | Wt 214.4 lb

## 2020-04-29 DIAGNOSIS — G8929 Other chronic pain: Secondary | ICD-10-CM | POA: Diagnosis not present

## 2020-04-29 DIAGNOSIS — M25511 Pain in right shoulder: Secondary | ICD-10-CM | POA: Diagnosis not present

## 2020-04-29 LAB — PTH, INTACT AND CALCIUM
Calcium: 9.9 mg/dL (ref 8.6–10.3)
PTH: 37 pg/mL (ref 16–77)

## 2020-04-29 NOTE — Progress Notes (Signed)
Subjective:    I'm seeing this patient as a consultation for:  Dr. Yetta Barre. Note will be routed back to referring provider/PCP.  CC: R shoulder pain  I, Molly Weber, LAT, ATC, am serving as scribe for Dr. Clementeen Graham.  HPI: Pt is a 65 y/o male presenting w/ chronic R shoulder pain ongoing for a few months and recently decreasing R shoulder ROM.  He locates his pain to within the [facility] joint. Pt c/o sometimes getting a "popping" sounds within [facility] joint.  Neck pain: no Radiating pain: yes- distally to elbow R shoulder mechanical symptoms: yes Aggravating factors: IR Treatments tried: Tylenol, ice, heat  Diagnostic testing: R shoulder XR- 04/27/20  Past medical history, Surgical history, Family history, Social history, Allergies, and medications have been entered into the medical record, reviewed.   Review of Systems: No new headache, visual changes, nausea, vomiting, diarrhea, constipation, dizziness, abdominal pain, skin rash, fevers, chills, night sweats, weight loss, swollen lymph nodes, body aches, joint swelling, muscle aches, chest pain, shortness of breath, mood changes, visual or auditory hallucinations.   Objective:    Vitals:   04/29/20 1013  BP: (!) 148/94  Pulse: 78  SpO2: 98%   General: Well Developed, well nourished, and in no acute distress.  Neuro/Psych: Alert and oriented x3, extra-ocular muscles intact, able to move all 4 extremities, sensation grossly intact. Skin: Warm and dry, no rashes noted.  Respiratory: Not using accessory muscles, speaking in full sentences, trachea midline.  Cardiovascular: Pulses palpable, no extremity edema. Abdomen: Does not appear distended. MSK: Right shoulder normal-appearing Nontender. Range of motion abduction full.  External rotation full internal rotation lumbar spine. Strength intact. Positive Hawkins and Neer's test.  Positive empty can test. Negative Yergason's and speeds test.   Lab and Radiology Results DG Shoulder  Right  Result Date: 04/27/2020 CLINICAL DATA:  Right shoulder pain with limited range of motion for 3-4 months. EXAM: RIGHT SHOULDER - 2+ VIEW COMPARISON:  None. FINDINGS: No acute fracture or dislocation is identified. There is mild marginal spurring at the acromioclavicular joint. The glenohumeral joint is unremarkable. The soft tissues are unremarkable. IMPRESSION: Mild acromioclavicular osteoarthrosis. Electronically Signed   By: Sebastian Ache M.D.   On: 04/27/2020 10:34  I, Clementeen Graham, personally (independently) visualized and performed the interpretation of the images attached in this note.  Diagnostic Limited MSK Ultrasound of: Right shoulder Biceps tendon intact normal-appearing Subscapularis tendon normal-appearing Supraspinatus tendon is intact. Mild subacromial bursitis present. Infraspinatus tendon normal-appearing AC joint narrowed degenerative with effusion. Impression: Subacromial bursitis and AC DJD    Impression and Recommendations:    Assessment and Plan: 65 y.o. male with right shoulder pain thought to be due to subacromial bursitis and AC DJD.  Plan for physical therapy home exercise program as primary means of improvement.  Recheck back in about 6 weeks.  If not better consider injection or MRI.Marland Kitchen  PDMP not reviewed this encounter. Orders Placed This Encounter  Procedures  . [place] LIMITED JOINT SPACE STRUCTURES UP RIGHT(NO LINKED CHARGES)    Standing Status:   Future    Number of Occurrences:   1    Standing Expiration Date:   10/29/2020    Order Specific Question:   Reason for Exam (SYMPTOM  OR DIAGNOSIS REQUIRED)    Answer:   chronic right shoulder pain    Order Specific Question:   Preferred imaging location?    Answer:   Adult nurse Sports [facility]  . Ambulatory referral to Physical Therapy  Referral Priority:   Routine    Referral Type:   Physical Medicine    Referral Reason:   Specialty Services Required    Requested Specialty:   Physical Therapy    No orders of the defined types were placed in this encounter.   Discussed warning signs or symptoms. Please see discharge instructions. Patient expresses understanding.   The above documentation has been reviewed and is accurate and complete Clementeen Graham, M.D.

## 2020-04-29 NOTE — Patient Instructions (Signed)
Thank you for coming in today.  I've referred you to Physical Therapy.  Let us know if you don't hear from them in one week.  Please use voltaren gel up to 4x daily for pain as needed.   Recheck in about 6 weeks.   If not improving let me know. We can do an injection sooner if needed.

## 2020-05-11 ENCOUNTER — Encounter: Payer: Self-pay | Admitting: Internal Medicine

## 2020-05-11 ENCOUNTER — Other Ambulatory Visit: Payer: Self-pay

## 2020-05-11 ENCOUNTER — Ambulatory Visit: Payer: Medicare HMO | Attending: Family Medicine | Admitting: Rehabilitative and Restorative Service Providers"

## 2020-05-11 ENCOUNTER — Encounter: Payer: Self-pay | Admitting: Rehabilitative and Restorative Service Providers"

## 2020-05-11 DIAGNOSIS — M19019 Primary osteoarthritis, unspecified shoulder: Secondary | ICD-10-CM | POA: Insufficient documentation

## 2020-05-11 DIAGNOSIS — G8929 Other chronic pain: Secondary | ICD-10-CM | POA: Diagnosis not present

## 2020-05-11 DIAGNOSIS — M62838 Other muscle spasm: Secondary | ICD-10-CM | POA: Diagnosis not present

## 2020-05-11 DIAGNOSIS — M25511 Pain in right shoulder: Secondary | ICD-10-CM | POA: Insufficient documentation

## 2020-05-11 DIAGNOSIS — R600 Localized edema: Secondary | ICD-10-CM

## 2020-05-11 DIAGNOSIS — M6281 Muscle weakness (generalized): Secondary | ICD-10-CM | POA: Diagnosis not present

## 2020-05-11 NOTE — Patient Instructions (Signed)
Access Code: [email or web address] URL: [email or web address] Date: 05/11/2020 Prepared by: Clydie Braun Kynzlie Hilleary  Exercises Seated Scapular Retraction - 1 x daily - 7 x weekly - 3 sets - 10 reps Shoulder External Rotation and Scapular Retraction with Resistance - 1 x daily - 7 x weekly - 3 sets - 10 reps Shoulder Flexion Wall Slide with Towel - 1 x daily - 7 x weekly - 3 sets - 10 reps Standing Shoulder Abduction Slides at Wall - 1 x daily - 7 x weekly - 3 sets - 10 reps

## 2020-05-11 NOTE — Therapy (Signed)
[facility] [address] [place], [place], [address] Phone: [phone]   Fax:  [phone]  Physical Therapy Evaluation  Patient Details  Name: Terry Harrington MRN: [id number] Date of Birth: 1955-06-10 Referring Provider (PT): Dr Clementeen Graham   Encounter Date: 05/11/2020   PT End of Session - 05/11/20 1141    Visit Number 1    Date for PT Re-Evaluation 07/09/20    PT Start Time 1058    PT Stop Time 1140    PT Time Calculation (min) 42 min    Activity Tolerance Patient tolerated treatment well    Behavior During Therapy [facility] for tasks assessed/performed           Past Medical History:  Diagnosis Date  . Diabetes mellitus   . GERD (gastroesophageal reflux disease)   . Heart murmur   . Hyperlipidemia   . Hypertension    on meds  . Hypothyroidism    on meds    Past Surgical History:  Procedure Laterality Date  . DENTAL SURGERY     several dental procedure approx 10 between 1999-2012  . HERNIA REPAIR     as a child  . SHOULDER ARTHROSCOPY  05/31/2011   Procedure: ARTHROSCOPY SHOULDER;  Surgeon: Kennieth Rad, MD;  Location: [facility] OR;  Service: Orthopedics;  Laterality: Left;  Shoulder Acromioplasty Left  . SHOULDER SURGERY  05/31/2011    impingment left    There were no vitals filed for this visit.    Subjective Assessment - 05/11/20 1100    Subjective Pt reports that around November/December of 2021 was when his R shoulder started to hurt.  He reports that it does not hurt constantly, but more with movement.  He reports a decreased ROM and feels that it is getting tighter.  Pt reports that he was in a physical job that did hurt his shoulder when he was working, but he retired in December.    Pertinent History L shoulder surgery in past to remove bone spur    Limitations Lifting;House hold activities    Diagnostic tests xray of shoulder reveals OA    Patient Stated Goals To get my range of motion back and be able to  wash my back.  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    Currently in Pain? Yes    Pain Score 8     Pain Location Shoulder    Pain Orientation Right    Pain Descriptors / Indicators Aching;Burning    Pain Type Chronic pain    Pain Onset More than a month ago    Pain Frequency Intermittent    Aggravating Factors  movement and use    Pain Relieving Factors resting and supporting shoulder    Multiple Pain Sites No              [facility] PT Assessment - 05/11/20 0001      Assessment   Medical Diagnosis R shoulder pain    Referring Provider (PT) Dr Clementeen Graham    Hand Dominance Right    Next MD Visit June 10, 2020      Prior Function   Level of Independence Independent    Vocation Retired    Engineer, production, Cytogeneticist   Overall Cognitive Status Impaired/Different from baseline      Observation/Other Assessments   Focus on Therapeutic Outcomes (FOTO)  50, Risk Adjusted 52      ROM / Strength  AROM / PROM / Strength AROM;Strength      AROM   Right/Left Shoulder Right    Right Shoulder Flexion 120 Degrees    Right Shoulder ABduction 90 Degrees    Right Shoulder External Rotation 35 Degrees      Strength   Overall Strength Deficits    Overall Strength Comments R shoulder strength grossly to 4/5 with pain noted                      Objective measurements completed on examination: See above findings.       [facility] Adult PT Treatment/Exercise - 05/11/20 0001      Exercises   Exercises Shoulder      Shoulder Exercises: ROM/Strengthening   UBE (Upper Arm Bike) L1.0 fwd/back    Wall Wash with towel for flexion/abd 2x10 each                  PT Education - 05/11/20 1140    Education Details Issued HEP.  Access Code: [email or web address]    Person(s) Educated Patient    Methods Explanation;Demonstration;Handout    Comprehension Verbalized understanding            PT Short Term Goals - 05/11/20 1149      PT SHORT  TERM GOAL #1   Title Pt will be independent with HEP.    Time 2    Period Weeks    Target Date 05/25/20             PT Long Term Goals - 05/11/20 1149      PT LONG TERM GOAL #1   Title Pt will be independent with advanced HEP.    Time 8    Period Weeks    Status New    Target Date 07/09/20      PT LONG TERM GOAL #2   Title Pt will increase shoulder flexion/abduction ROM to at least 150 degrees to allow him to reach overhead in cabinets.    Baseline R shoulder flexion 120 degrees, R shoulder abduction 90 degrees.    Time 8    Period Weeks    Status New      PT LONG TERM GOAL #3   Title Patient to have pain no greater than 3/10 during functional and leisure activities.    Time 8    Period Weeks    Status New      PT LONG TERM GOAL #4   Title Increase strength to at least 24minus/5 to allow him to carry his camera equipment and perform household chores.    Time 8    Period Weeks                  Plan - 05/11/20 1142    Clinical Impression Statement Pt presents with a 6 month history of R shoulder pain being followed by Dr Earma Reading.  His diagnostic imaging revealed shoulder OA.  He presents with decreased R shoulder ROM, increased pain, and decreased strength.  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  Following UBE and exercises, pt with reports of muscle fatigue, but denies increase of pain.  Mr Mucci would benefit from skilled PT to address his functional impairments to allow him to return to his prior independent and painfree activities.    Personal Factors and Comorbidities Age;Fitness    Examination-Activity Limitations  Bathing;Reach Overhead;Carry;Dressing;Hygiene/Grooming;Lift    Examination-Participation Restrictions Occupation;Cleaning;Yard Work    Conservation officer, historic buildings Evolving/Moderate complexity    Clinical Decision Making Moderate    Rehab  Potential Good    PT Frequency 2x / week    PT Duration 8 weeks    PT Treatment/Interventions ADLs/Self Care Home Management;Electrical Stimulation;Iontophoresis 4mg /ml Dexamethasone;Moist Heat;Ultrasound;Functional mobility training;Therapeutic activities;Therapeutic exercise;Neuromuscular re-education;Patient/family education;Manual techniques;Passive range of motion;Dry needling    PT Next Visit Plan progress ROM and strengthening    PT Home Exercise Plan issued HEP  Access Code:           Patient will benefit from skilled therapeutic intervention in order to improve the following deficits and impairments:  Decreased range of motion,Increased fascial restricitons,Increased muscle spasms,Impaired UE functional use,Decreased activity tolerance,Pain,Hypomobility,Impaired flexibility,Improper body mechanics,Decreased strength,Increased edema  Visit Diagnosis: Chronic right shoulder pain - Plan: PT plan of care cert/re-cert  Localized edema - Plan: PT plan of care cert/re-cert  Other muscle spasm - Plan: PT plan of care cert/re-cert  Shoulder arthritis - Plan: PT plan of care cert/re-cert  Muscle weakness (generalized) - Plan: PT plan of care cert/re-cert     Problem List Patient Active Problem List   Diagnosis Date Noted  . Chronic right shoulder pain 04/27/2020  . Hypercalcemia 04/27/2020  . Benign prostatic hyperplasia without lower urinary tract symptoms 03/02/2020  . Rectus diastasis 08/22/2019  . Primary osteoarthritis of left hip 05/12/2019  . Vitamin D deficiency disease 11/07/2017  . Pure hypertriglyceridemia 11/07/2017  . Hypogonadism male 12/28/2016  . Erectile dysfunction 12/27/2016  . Routine general medical examination at a health care facility 12/27/2016  . Idiopathic scoliosis 11/30/2015  . Middle insomnia 02/18/2015  . Nonspecific abnormal electrocardiogram (ECG) (EKG) 07/27/2014  . Snoring 03/13/2014  . Hyperlipidemia with target LDL less than 100  06/20/2013  . Hypothyroidism (acquired) 06/20/2013  . Personal history of colonic polyps 06/20/2013  . Type II diabetes mellitus with manifestations (HCC) 03/22/2013  . HTN (hypertension) 03/22/2013    03/24/2013, PT, DPT 05/11/2020, 11:59 AM  [facility] [address] [place], [place], [place] Phone: [phone]   Fax:  [phone]  Name: Terry Harrington MRN: [name] Date of Birth: 02/04/1955

## 2020-05-13 DIAGNOSIS — N033 Chronic nephritic syndrome with diffuse mesangial proliferative glomerulonephritis: Secondary | ICD-10-CM | POA: Diagnosis not present

## 2020-05-19 ENCOUNTER — Encounter: Payer: Self-pay | Admitting: Rehabilitative and Restorative Service Providers"

## 2020-05-19 ENCOUNTER — Other Ambulatory Visit: Payer: Self-pay

## 2020-05-19 ENCOUNTER — Ambulatory Visit: Payer: Medicare HMO | Admitting: Rehabilitative and Restorative Service Providers"

## 2020-05-19 DIAGNOSIS — R600 Localized edema: Secondary | ICD-10-CM | POA: Diagnosis not present

## 2020-05-19 DIAGNOSIS — G8929 Other chronic pain: Secondary | ICD-10-CM

## 2020-05-19 DIAGNOSIS — M19019 Primary osteoarthritis, unspecified shoulder: Secondary | ICD-10-CM | POA: Diagnosis not present

## 2020-05-19 DIAGNOSIS — M62838 Other muscle spasm: Secondary | ICD-10-CM | POA: Diagnosis not present

## 2020-05-19 DIAGNOSIS — M25511 Pain in right shoulder: Secondary | ICD-10-CM | POA: Diagnosis not present

## 2020-05-19 DIAGNOSIS — M6281 Muscle weakness (generalized): Secondary | ICD-10-CM

## 2020-05-19 NOTE — Therapy (Signed)
[facility] [address] [place], [place], [address] Phone: [phone]   Fax:  [phone]  Physical Therapy Treatment  Patient Details  Name: Terry Harrington MRN: [id number] Date of Birth: 1955/07/14 Referring Provider (PT): Dr Clementeen Graham   Encounter Date: 05/19/2020   PT End of Session - 05/19/20 0849    Visit Number 2    Date for PT Re-Evaluation 07/09/20    PT Start Time 0844    PT Stop Time 0930    PT Time Calculation (min) 46 min    Activity Tolerance Patient tolerated treatment well    Behavior During Therapy [facility] for tasks assessed/performed           Past Medical History:  Diagnosis Date  . Diabetes mellitus   . GERD (gastroesophageal reflux disease)   . Heart murmur   . Hyperlipidemia   . Hypertension    on meds  . Hypothyroidism    on meds    Past Surgical History:  Procedure Laterality Date  . DENTAL SURGERY     several dental procedure approx 10 between 1999-2012  . HERNIA REPAIR     as a child  . SHOULDER ARTHROSCOPY  05/31/2011   Procedure: ARTHROSCOPY SHOULDER;  Surgeon: Kennieth Rad, MD;  Location: [facility] OR;  Service: Orthopedics;  Laterality: Left;  Shoulder Acromioplasty Left  . SHOULDER SURGERY  05/31/2011    impingment left    There were no vitals filed for this visit.   Subjective Assessment - 05/19/20 0847    Subjective I am doing okay today.    Currently in Pain? Yes    Pain Score 3     Pain Location Shoulder    Pain Orientation Right    Pain Descriptors / Indicators Aching;Burning    Pain Type Chronic pain                             [facility] Adult PT Treatment/Exercise - 05/19/20 0001      Shoulder Exercises: Seated   Row Both;20 reps    Row Limitations no weight    Horizontal ABduction Both;12 reps    Theraband Level (Shoulder Horizontal ABduction) Level 1 (Yellow)    External Rotation Both;12 reps    Theraband Level (Shoulder External Rotation) Level 1  (Yellow)      Shoulder Exercises: Standing   Flexion Both;10 reps    Shoulder Flexion Weight (lbs) 2 lb   WaTe bar   Extension Both;10 reps    Extension Weight (lbs) 2   WaTe bar   Diagonals Right;12 reps    Theraband Level (Shoulder Diagonals) Level 1 (Yellow)    Diagonals Limitations D2    Other Standing Exercises Body blade R/L and up/down x1 min each.  Ball toss x2 min in various directions.    Other Standing Exercises Sit to/from stand with chest press with yellow weighted ball x10, then STS with overhead press with yellow ball x10 reps      Shoulder Exercises: ROM/Strengthening   UBE (Upper Arm Bike) L1.2 fwd/back    Wall Pushups 10 reps    Ball on Wall roll ball up with 2 sec hold at end range x10 reps      Modalities   Modalities Cryotherapy      Cryotherapy   Number Minutes Cryotherapy 10 Minutes    Cryotherapy Location Shoulder    Type of Cryotherapy Ice pack  PT Short Term Goals - 05/19/20 0931      PT SHORT TERM GOAL #1   Title Pt will be independent with HEP.    Status On-going             PT Long Term Goals - 05/11/20 1149      PT LONG TERM GOAL #1   Title Pt will be independent with advanced HEP.    Time 8    Period Weeks    Status New    Target Date 07/09/20      PT LONG TERM GOAL #2   Title Pt will increase shoulder flexion/abduction ROM to at least 150 degrees to allow him to reach overhead in cabinets.    Baseline R shoulder flexion 120 degrees, R shoulder abduction 90 degrees.    Time 8    Period Weeks    Status New      PT LONG TERM GOAL #3   Title Patient to have pain no greater than 3/10 during functional and leisure activities.    Time 8    Period Weeks    Status New      PT LONG TERM GOAL #4   Title Increase strength to at least 81minus/5 to allow him to carry his camera equipment and perform household chores.    Time 8    Period Weeks                 Plan - 05/19/20 9604    Clinical  Impression Statement Patient tolerated session well and has had a decrease in pain with performing HEP since initial eval.  He has muscle fatigue with strengthening exercises and had a brief muscle burning sensation with body blade exercise.  He requires cuing for technique throughout session, but has improved overall mobility.  He continues to require skilled PT to progress towards goal related activities.    PT Treatment/Interventions ADLs/Self Care Home Management;Electrical Stimulation;Iontophoresis 4mg /ml Dexamethasone;Moist Heat;Ultrasound;Functional mobility training;Therapeutic activities;Therapeutic exercise;Neuromuscular re-education;Patient/family education;Manual techniques;Passive range of motion;Dry needling    PT Next Visit Plan progress ROM and strengthening    Consulted and Agree with Plan of Care Patient           Patient will benefit from skilled therapeutic intervention in order to improve the following deficits and impairments:  Decreased range of motion,Increased fascial restricitons,Increased muscle spasms,Impaired UE functional use,Decreased activity tolerance,Pain,Hypomobility,Impaired flexibility,Improper body mechanics,Decreased strength,Increased edema  Visit Diagnosis: Chronic right shoulder pain  Localized edema  Other muscle spasm  Shoulder arthritis  Muscle weakness (generalized)     Problem List Patient Active Problem List   Diagnosis Date Noted  . Chronic right shoulder pain 04/27/2020  . Hypercalcemia 04/27/2020  . Benign prostatic hyperplasia without lower urinary tract symptoms 03/02/2020  . Rectus diastasis 08/22/2019  . Primary osteoarthritis of left hip 05/12/2019  . Vitamin D deficiency disease 11/07/2017  . Pure hypertriglyceridemia 11/07/2017  . Hypogonadism male 12/28/2016  . Erectile dysfunction 12/27/2016  . Routine general medical examination at a health care facility 12/27/2016  . Idiopathic scoliosis 11/30/2015  . Middle insomnia  02/18/2015  . Nonspecific abnormal electrocardiogram (ECG) (EKG) 07/27/2014  . Snoring 03/13/2014  . Hyperlipidemia with target LDL less than 100 06/20/2013  . Hypothyroidism (acquired) 06/20/2013  . Personal history of colonic polyps 06/20/2013  . Type II diabetes mellitus with manifestations (HCC) 03/22/2013  . HTN (hypertension) 03/22/2013    03/24/2013, PT, DPT 05/19/2020, 9:34 AM  [facility] [phone] [place], [place], [address] Phone: [phone]   Fax:  [phone]  Name: Terry Harrington MRN: [id number] Date of Birth: April 24, 1955

## 2020-05-21 ENCOUNTER — Other Ambulatory Visit: Payer: Self-pay

## 2020-05-21 ENCOUNTER — Ambulatory Visit: Payer: Medicare HMO | Admitting: Rehabilitative and Restorative Service Providers"

## 2020-05-21 ENCOUNTER — Encounter: Payer: Self-pay | Admitting: Rehabilitative and Restorative Service Providers"

## 2020-05-21 DIAGNOSIS — M62838 Other muscle spasm: Secondary | ICD-10-CM | POA: Diagnosis not present

## 2020-05-21 DIAGNOSIS — M19019 Primary osteoarthritis, unspecified shoulder: Secondary | ICD-10-CM

## 2020-05-21 DIAGNOSIS — R600 Localized edema: Secondary | ICD-10-CM | POA: Diagnosis not present

## 2020-05-21 DIAGNOSIS — G8929 Other chronic pain: Secondary | ICD-10-CM

## 2020-05-21 DIAGNOSIS — M6281 Muscle weakness (generalized): Secondary | ICD-10-CM

## 2020-05-21 DIAGNOSIS — M25511 Pain in right shoulder: Secondary | ICD-10-CM | POA: Diagnosis not present

## 2020-05-21 NOTE — Therapy (Signed)
[place]. [place], [place], [address] Phone: [phone]   Fax:  [phone]  Physical Therapy Treatment  Patient Details  Name: Terry Harrington MRN: [id number] Date of Birth: 07-08-1955 Referring Provider (PT): Dr Lynne Leader   Encounter Date: 05/21/2020   PT End of Session - 05/21/20 0924    Visit Number 3    Date for PT Re-Evaluation 07/09/20    PT Start Time 0845    PT Stop Time 0925    PT Time Calculation (min) 40 min    Activity Tolerance Patient tolerated treatment well    Behavior During Therapy [facility] for tasks assessed/performed           Past Medical History:  Diagnosis Date  . Diabetes mellitus   . GERD (gastroesophageal reflux disease)   . Heart murmur   . Hyperlipidemia   . Hypertension    on meds  . Hypothyroidism    on meds    Past Surgical History:  Procedure Laterality Date  . DENTAL SURGERY     several dental procedure approx 10 between 1999-2012  . HERNIA REPAIR     as a child  . SHOULDER ARTHROSCOPY  05/31/2011   Procedure: ARTHROSCOPY SHOULDER;  Surgeon: Sharmon Revere, MD;  Location: [facility];  Service: Orthopedics;  Laterality: Left;  Shoulder Acromioplasty Left  . SHOULDER SURGERY  05/31/2011    impingment left    There were no vitals filed for this visit.   Subjective Assessment - 05/21/20 0848    Subjective I am ready to go.  My leg muscles were sore after last time, but my shoulder is doing okay.    Currently in Pain? Yes    Pain Location Shoulder    Pain Orientation Left    Pain Descriptors / Indicators Aching;Burning    Pain Type Chronic pain    Multiple Pain Sites No              [facility] PT Assessment - 05/21/20 0001      AROM   Right Shoulder Flexion 150 Degrees    Right Shoulder ABduction 155 Degrees                         [facility] Adult PT Treatment/Exercise - 05/21/20 0001      Shoulder Exercises: Seated   External Rotation Both;12  reps    Theraband Level (Shoulder External Rotation) Level 1 (Yellow)      Shoulder Exercises: Standing   Flexion Both;20 reps    Shoulder Flexion Weight (lbs) 2 lb   WaTe bar   Extension Both;20 reps    Extension Weight (lbs) 2   WaTe bar   Diagonals Right;12 reps    Theraband Level (Shoulder Diagonals) Level 1 (Yellow)    Diagonals Limitations D2    Other Standing Exercises Body blade R/L and up/down x1 min each.  Ball toss x2 min in various directions.    Other Standing Exercises Sit to/from stand with chest press with yellow weighted ball x10, then STS with overhead press with yellow ball x10 reps      Shoulder Exercises: ROM/Strengthening   UBE (Upper Arm Bike) L2.0, 79min fwd/back    Lat Pull 3 plate;20 reps   2x10 reps, cuing to only pull down to chest level then back up   Cybex Press 3 plate;20 reps   2x10 reps   Cybex Row 3 plate;20 reps   2x10  reps, cuing to squeeze shoulder blades together   Wall Pushups 10 reps    Ball on Wall roll ball up with 2 sec hold at end range x10 reps                    PT Short Term Goals - 05/21/20 0931      PT SHORT TERM GOAL #1   Title Pt will be independent with HEP.    Status Achieved             PT Long Term Goals - 05/21/20 0931      PT LONG TERM GOAL #1   Title Pt will be independent with advanced HEP.    Status On-going      PT LONG TERM GOAL #2   Title Pt will increase shoulder flexion/abduction ROM to at least 150 degrees to allow him to reach overhead in cabinets.    Status Partially Met      PT LONG TERM GOAL #3   Title Patient to have pain no greater than 3/10 during functional and leisure activities.    Status Partially Met      PT LONG TERM GOAL #4   Title Increase strength to at least 40minus/5 to allow him to carry his camera equipment and perform household chores.    Status On-going                 Plan - 05/21/20 0925    Clinical Impression Statement Patient is progressing well with goal  related activities and is improving on his ROM and improving his posture.  He was able to progress to Cybex machine today at 15 pounds.  He continues to require skilled PT to progress towards his goal related activities.    PT Treatment/Interventions ADLs/Self Care Home Management;Electrical Stimulation;Iontophoresis 4mg /ml Dexamethasone;Moist Heat;Ultrasound;Functional mobility training;Therapeutic activities;Therapeutic exercise;Neuromuscular re-education;Patient/family education;Manual techniques;Passive range of motion;Dry needling    PT Next Visit Plan progress ROM and strengthening    Consulted and Agree with Plan of Care Patient           Patient will benefit from skilled therapeutic intervention in order to improve the following deficits and impairments:  Decreased range of motion,Increased fascial restricitons,Increased muscle spasms,Impaired UE functional use,Decreased activity tolerance,Pain,Hypomobility,Impaired flexibility,Improper body mechanics,Decreased strength,Increased edema  Visit Diagnosis: Chronic right shoulder pain  Other muscle spasm  Shoulder arthritis  Muscle weakness (generalized)     Problem List Patient Active Problem List   Diagnosis Date Noted  . Chronic right shoulder pain 04/27/2020  . Hypercalcemia 04/27/2020  . Benign prostatic hyperplasia without lower urinary tract symptoms 03/02/2020  . Rectus diastasis 08/22/2019  . Primary osteoarthritis of left hip 05/12/2019  . Vitamin D deficiency disease 11/07/2017  . Pure hypertriglyceridemia 11/07/2017  . Hypogonadism male 12/28/2016  . Erectile dysfunction 12/27/2016  . Routine general medical examination at a health care facility 12/27/2016  . Idiopathic scoliosis 11/30/2015  . Middle insomnia 02/18/2015  . Nonspecific abnormal electrocardiogram (ECG) (EKG) 07/27/2014  . Snoring 03/13/2014  . Hyperlipidemia with target LDL less than 100 06/20/2013  . Hypothyroidism (acquired) 06/20/2013  .  Personal history of colonic polyps 06/20/2013  . Type II diabetes mellitus with manifestations ([place]) 03/22/2013  . HTN (hypertension) 03/22/2013    Juel Burrow, PT, DPT 05/21/2020, 9:35 AM  [place]. [place], [place], [address] Phone: [phone]   Fax:  [phone]  Name: KALIJAH ZEISS MRN: [id number] Date of Birth:  02/26/1955   

## 2020-05-23 ENCOUNTER — Other Ambulatory Visit: Payer: Self-pay | Admitting: Internal Medicine

## 2020-05-23 DIAGNOSIS — E559 Vitamin D deficiency, unspecified: Secondary | ICD-10-CM

## 2020-05-24 ENCOUNTER — Other Ambulatory Visit: Payer: Self-pay

## 2020-05-24 ENCOUNTER — Ambulatory Visit: Payer: Medicare HMO | Admitting: Rehabilitative and Restorative Service Providers"

## 2020-05-24 ENCOUNTER — Encounter: Payer: Self-pay | Admitting: Rehabilitative and Restorative Service Providers"

## 2020-05-24 DIAGNOSIS — G8929 Other chronic pain: Secondary | ICD-10-CM | POA: Diagnosis not present

## 2020-05-24 DIAGNOSIS — M62838 Other muscle spasm: Secondary | ICD-10-CM

## 2020-05-24 DIAGNOSIS — R600 Localized edema: Secondary | ICD-10-CM | POA: Diagnosis not present

## 2020-05-24 DIAGNOSIS — M25511 Pain in right shoulder: Secondary | ICD-10-CM | POA: Diagnosis not present

## 2020-05-24 DIAGNOSIS — M6281 Muscle weakness (generalized): Secondary | ICD-10-CM

## 2020-05-24 DIAGNOSIS — M19019 Primary osteoarthritis, unspecified shoulder: Secondary | ICD-10-CM | POA: Diagnosis not present

## 2020-05-24 NOTE — Therapy (Signed)
[facility] [address] [place], [place], [address] Phone: [phone]   Fax:  [phone]  Physical Therapy Treatment  Patient Details  Name: Terry Harrington MRN: [id number] Date of Birth: Jul 10, 1955 Referring Provider (PT): Dr Clementeen Graham   Encounter Date: 05/24/2020   PT End of Session - 05/24/20 0849    Visit Number 4    Date for PT Re-Evaluation 07/09/20    PT Start Time 0843    PT Stop Time 0923    PT Time Calculation (min) 40 min    Activity Tolerance Patient tolerated treatment well    Behavior During Therapy [facility] for tasks assessed/performed           Past Medical History:  Diagnosis Date  . Diabetes mellitus   . GERD (gastroesophageal reflux disease)   . Heart murmur   . Hyperlipidemia   . Hypertension    on meds  . Hypothyroidism    on meds    Past Surgical History:  Procedure Laterality Date  . DENTAL SURGERY     several dental procedure approx 10 between 1999-2012  . HERNIA REPAIR     as a child  . SHOULDER ARTHROSCOPY  05/31/2011   Procedure: ARTHROSCOPY SHOULDER;  Surgeon: Kennieth Rad, MD;  Location: [facility] OR;  Service: Orthopedics;  Laterality: Left;  Shoulder Acromioplasty Left  . SHOULDER SURGERY  05/31/2011    impingment left    There were no vitals filed for this visit.   Subjective Assessment - 05/24/20 0847    Subjective I am doing okay.  I tried to pull my camera out for photography, I had to use the smaller lense.    Patient Stated Goals To get my range of motion back and be able to wash my back.  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    Currently in Pain? Yes    Pain Score 1     Pain Location Shoulder    Pain Orientation Right    Pain Descriptors / Indicators Burning    Pain Type Chronic pain                             [facility] Adult PT Treatment/Exercise - 05/24/20 0001      Shoulder Exercises: Seated   External Rotation Both;20 reps     Theraband Level (Shoulder External Rotation) Level 1 (Yellow)      Shoulder Exercises: Standing   Flexion Both;20 reps    Shoulder Flexion Weight (lbs) 3   WaTe bar   Extension Both;20 reps    Extension Weight (lbs) 3   WaTe bar   Diagonals Right;20 reps    Theraband Level (Shoulder Diagonals) Level 1 (Yellow)    Diagonals Limitations D2    Other Standing Exercises Body blade R/L and up/down x1 min each.  Ball toss x2 min in various directions.    Other Standing Exercises Sit to/from stand with chest press with yellow weighted ball x10, then STS with overhead press with yellow ball x10 reps.  Bicep curls up to eye level with 20 # 2x10.  Carrying 20# in R arm around gym 2 laps with a break in between.      Shoulder Exercises: ROM/Strengthening   UBE (Upper Arm Bike) L2.0, fwd/back    Lat Pull 3 plate;20 reps    Cybex Press 3 plate;20 reps    Cybex Row 3 plate;20 reps  2x10   Wall Pushups 10 reps    Modified Plank 2 reps   plank on elbows, 20 sec hold   Ball on Wall roll ball up with 2 sec hold at end range x10 reps                    PT Short Term Goals - 05/21/20 0931      PT SHORT TERM GOAL #1   Title Pt will be independent with HEP.    Status Achieved             PT Long Term Goals - 05/21/20 0931      PT LONG TERM GOAL #1   Title Pt will be independent with advanced HEP.    Status On-going      PT LONG TERM GOAL #2   Title Pt will increase shoulder flexion/abduction ROM to at least 150 degrees to allow him to reach overhead in cabinets.    Status Partially Met      PT LONG TERM GOAL #3   Title Patient to have pain no greater than 3/10 during functional and leisure activities.    Status Partially Met      PT LONG TERM GOAL #4   Title Increase strength to at least 9minus/5 to allow him to carry his camera equipment and perform household chores.    Status On-going                 Plan - 05/24/20 0920    Clinical Impression Statement  Patient is progressing with goal related activities.  Started utilizing 20 pound weight to simulate activities performed with camera.  Pt required occasional cuing with walking with heavy weight to maintain proper posture and shoulders back.  Pt reports burning/muscle fatigue with exercises, but able to perform.  Pt with difficulty with plank exercise, but able to hold for 20 seconds.  Pt continues to require skilled PT to progress towards goal related activites.    Personal Factors and Comorbidities Age;Fitness    Examination-Activity Limitations Bathing;Reach Overhead;Carry;Dressing;Hygiene/Grooming;Lift    PT Treatment/Interventions ADLs/Self Care Home Management;Electrical Stimulation;Iontophoresis 4mg /ml Dexamethasone;Moist Heat;Ultrasound;Functional mobility training;Therapeutic activities;Therapeutic exercise;Neuromuscular re-education;Patient/family education;Manual techniques;Passive range of motion;Dry needling    PT Next Visit Plan progress ROM and strengthening    Consulted and Agree with Plan of Care Patient           Patient will benefit from skilled therapeutic intervention in order to improve the following deficits and impairments:  Decreased range of motion,Increased fascial restricitons,Increased muscle spasms,Impaired UE functional use,Decreased activity tolerance,Pain,Hypomobility,Impaired flexibility,Improper body mechanics,Decreased strength,Increased edema  Visit Diagnosis: Chronic right shoulder pain  Other muscle spasm  Shoulder arthritis  Muscle weakness (generalized)     Problem List Patient Active Problem List   Diagnosis Date Noted  . Chronic right shoulder pain 04/27/2020  . Hypercalcemia 04/27/2020  . Benign prostatic hyperplasia without lower urinary tract symptoms 03/02/2020  . Rectus diastasis 08/22/2019  . Primary osteoarthritis of left hip 05/12/2019  . Vitamin D deficiency disease 11/07/2017  . Pure hypertriglyceridemia 11/07/2017  .  Hypogonadism male 12/28/2016  . Erectile dysfunction 12/27/2016  . Routine general medical examination at a health care facility 12/27/2016  . Idiopathic scoliosis 11/30/2015  . Middle insomnia 02/18/2015  . Nonspecific abnormal electrocardiogram (ECG) (EKG) 07/27/2014  . Snoring 03/13/2014  . Hyperlipidemia with target LDL less than 100 06/20/2013  . Hypothyroidism (acquired) 06/20/2013  . Personal history of colonic polyps 06/20/2013  . Type II diabetes mellitus  with manifestations ([place]) 03/22/2013  . HTN (hypertension) 03/22/2013    Juel Burrow, PT, DPT 05/24/2020, 9:36 AM  [place]. [place], [place], [address] Phone: [phone]   Fax:  [phone]  Name: LENFORD BEDDOW MRN: [id number] Date of Birth: April 10, 1955

## 2020-05-26 ENCOUNTER — Other Ambulatory Visit: Payer: Self-pay

## 2020-05-26 ENCOUNTER — Encounter: Payer: Self-pay | Admitting: Rehabilitative and Restorative Service Providers"

## 2020-05-26 ENCOUNTER — Ambulatory Visit: Payer: Medicare HMO | Admitting: Rehabilitative and Restorative Service Providers"

## 2020-05-26 DIAGNOSIS — M19019 Primary osteoarthritis, unspecified shoulder: Secondary | ICD-10-CM

## 2020-05-26 DIAGNOSIS — M25511 Pain in right shoulder: Secondary | ICD-10-CM | POA: Diagnosis not present

## 2020-05-26 DIAGNOSIS — G8929 Other chronic pain: Secondary | ICD-10-CM | POA: Diagnosis not present

## 2020-05-26 DIAGNOSIS — M62838 Other muscle spasm: Secondary | ICD-10-CM

## 2020-05-26 DIAGNOSIS — R600 Localized edema: Secondary | ICD-10-CM

## 2020-05-26 DIAGNOSIS — M6281 Muscle weakness (generalized): Secondary | ICD-10-CM

## 2020-05-26 NOTE — Therapy (Signed)
[place]. [place], [place], [address] Phone: [phone]   Fax:  [phone]  Physical Therapy Treatment  Patient Details  Name: Terry Harrington MRN: [id number] Date of Birth: 1955-02-09 Referring Provider (PT): Dr Lynne Leader   Encounter Date: 05/26/2020   PT End of Session - 05/26/20 0852    Visit Number 5    Date for PT Re-Evaluation 07/09/20    PT Start Time 0845    PT Stop Time 0935    PT Time Calculation (min) 50 min    Activity Tolerance Patient tolerated treatment well    Behavior During Therapy [facility] for tasks assessed/performed           Past Medical History:  Diagnosis Date  . Diabetes mellitus   . GERD (gastroesophageal reflux disease)   . Heart murmur   . Hyperlipidemia   . Hypertension    on meds  . Hypothyroidism    on meds    Past Surgical History:  Procedure Laterality Date  . DENTAL SURGERY     several dental procedure approx 10 between 1999-2012  . HERNIA REPAIR     as a child  . SHOULDER ARTHROSCOPY  05/31/2011   Procedure: ARTHROSCOPY SHOULDER;  Surgeon: Sharmon Revere, MD;  Location: [place];  Service: Orthopedics;  Laterality: Left;  Shoulder Acromioplasty Left  . SHOULDER SURGERY  05/31/2011    impingment left    There were no vitals filed for this visit.   Subjective Assessment - 05/26/20 0850    Subjective I am doing good.  I leave tomorrow for my trip to [place].    Limitations Lifting;House hold activities    Patient Stated Goals To get my range of motion back and be able to wash my back.  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    Currently in Pain? Yes    Pain Score 1     Pain Location Shoulder    Pain Orientation Right    Pain Descriptors / Indicators Burning    Pain Type Chronic pain                             [facility] Adult PT Treatment/Exercise - 05/26/20 0001      Shoulder Exercises: Seated   External Rotation Both;20 reps    2x10   Theraband Level (Shoulder External Rotation) Level 2 (Red)      Shoulder Exercises: Standing   Flexion Both;20 reps    Shoulder Flexion Weight (lbs) 3   Wa[place] bar   Extension Both;20 reps    Extension Weight (lbs) 3   Wa[place] bar   Diagonals Right;20 reps   2x10   Theraband Level (Shoulder Diagonals) Level 2 (Red)    Diagonals Limitations D2    Other Standing Exercises Sit to/from stand with chest press with yellow weighted ball x10, then STS with overhead press with yellow ball x10 reps.  Bicep curls up to eye level with 20 # 2x10.  Carrying 20# in R arm around gym 2 laps with a break in between.      Shoulder Exercises: ROM/Strengthening   UBE (Upper Arm Bike) L2.0, 45min fwd/back    Lat Pull 3 plate;20 reps    Cybex Press 3 plate;20 reps    Cybex Row 3 plate;20 reps    Wall Pushups 10 reps    Modified Plank 30 seconds;2 reps    [place] on  Wall roll ball up with 2 sec hold at end range x10 reps    Other ROM/Strengthening Exercises Triceps 25# bilat 2x10      Modalities   Modalities Cryotherapy      Cryotherapy   Number Minutes Cryotherapy 10 Minutes    Cryotherapy Location Shoulder    Type of Cryotherapy Ice pack                    PT Short Term Goals - 05/21/20 0931      PT SHORT TERM GOAL #1   Title Pt will be independent with HEP.    Status Achieved             PT Long Term Goals - 05/26/20 0933      PT LONG TERM GOAL #1   Status Partially Met      PT LONG TERM GOAL #2   Title Pt will increase shoulder flexion/abduction ROM to at least 150 degrees to allow him to reach overhead in cabinets.    Baseline R shoulder flexion of 165 degrees.  R shoulder abduction to 145 degrees.    Status Partially Met      PT LONG TERM GOAL #3   Title Patient to have pain no greater than 3/10 during functional and leisure activities.    Status Partially Met      PT LONG TERM GOAL #4   Title Increase strength to at least 25minus/5 to allow him to carry his camera  equipment and perform household chores.    Status Partially Met                 Plan - 05/26/20 0927    Clinical Impression Statement Patient is progressing towards goal related activities.  He is progressing with increased strength and able to add triceps in today and increased time with plank.  Pt continues with most difficulty with planks due to muscle fatigue.  He contiues to be unable to hold/carry his full camera set as he did prior to his shoulder injury.  He continues to require skilled PT to address his funcitonal impairments.    PT Treatment/Interventions ADLs/Self Care Home Management;Electrical Stimulation;Iontophoresis 4mg /ml Dexamethasone;Moist Heat;Ultrasound;Functional mobility training;Therapeutic activities;Therapeutic exercise;Neuromuscular re-education;Patient/family education;Manual techniques;Passive range of motion;Dry needling    PT Next Visit Plan progress ROM and strengthening    Consulted and Agree with Plan of Care Patient           Patient will benefit from skilled therapeutic intervention in order to improve the following deficits and impairments:  Decreased range of motion,Increased fascial restricitons,Increased muscle spasms,Impaired UE functional use,Decreased activity tolerance,Pain,Hypomobility,Impaired flexibility,Improper body mechanics,Decreased strength,Increased edema  Visit Diagnosis: Chronic right shoulder pain  Other muscle spasm  Shoulder arthritis  Muscle weakness (generalized)  Localized edema     Problem List Patient Active Problem List   Diagnosis Date Noted  . Chronic right shoulder pain 04/27/2020  . Hypercalcemia 04/27/2020  . Benign prostatic hyperplasia without lower urinary tract symptoms 03/02/2020  . Rectus diastasis 08/22/2019  . Primary osteoarthritis of left hip 05/12/2019  . Vitamin D deficiency disease 11/07/2017  . Pure hypertriglyceridemia 11/07/2017  . Hypogonadism male 12/28/2016  . Erectile dysfunction  12/27/2016  . Routine general medical examination at a health care facility 12/27/2016  . Idiopathic scoliosis 11/30/2015  . Middle insomnia 02/18/2015  . Nonspecific abnormal electrocardiogram (ECG) (EKG) 07/27/2014  . Snoring 03/13/2014  . Hyperlipidemia with target LDL less than 100 06/20/2013  . Hypothyroidism (acquired) 06/20/2013  .  Personal history of colonic polyps 06/20/2013  . Type II diabetes mellitus with manifestations ([place]) 03/22/2013  . HTN (hypertension) 03/22/2013    Juel Burrow, PT, DPT 05/26/2020, 9:41 AM  [place]. [place], [place], [address] Phone: [phone]   Fax:  [phone]  Name: Terry Harrington MRN: [id number] Date of Birth: 02/15/1955

## 2020-05-27 ENCOUNTER — Other Ambulatory Visit: Payer: Self-pay | Admitting: Internal Medicine

## 2020-05-27 DIAGNOSIS — E781 Pure hyperglyceridemia: Secondary | ICD-10-CM

## 2020-06-02 ENCOUNTER — Encounter: Payer: Self-pay | Admitting: Rehabilitative and Restorative Service Providers"

## 2020-06-02 ENCOUNTER — Ambulatory Visit: Payer: Medicare HMO | Admitting: Rehabilitative and Restorative Service Providers"

## 2020-06-02 ENCOUNTER — Other Ambulatory Visit: Payer: Self-pay

## 2020-06-02 DIAGNOSIS — M25511 Pain in right shoulder: Secondary | ICD-10-CM | POA: Diagnosis not present

## 2020-06-02 DIAGNOSIS — M6281 Muscle weakness (generalized): Secondary | ICD-10-CM

## 2020-06-02 DIAGNOSIS — M19019 Primary osteoarthritis, unspecified shoulder: Secondary | ICD-10-CM | POA: Diagnosis not present

## 2020-06-02 DIAGNOSIS — R600 Localized edema: Secondary | ICD-10-CM | POA: Diagnosis not present

## 2020-06-02 DIAGNOSIS — G8929 Other chronic pain: Secondary | ICD-10-CM | POA: Diagnosis not present

## 2020-06-02 DIAGNOSIS — M62838 Other muscle spasm: Secondary | ICD-10-CM

## 2020-06-02 NOTE — Therapy (Signed)
[place]. [place], [place], [address] Phone: [phone]   Fax:  [phone]  Physical Therapy Treatment  Patient Details  Name: Terry Harrington MRN: [id number] Date of Birth: [birth year] Referring Provider (PT): Dr Lynne Leader   Encounter Date: 06/02/2020   PT End of Session - 06/02/20 0851    Visit Number 6    Date for PT Re-Evaluation 07/09/20    PT Start Time 0841    PT Stop Time 0935    PT Time Calculation (min) 54 min    Activity Tolerance Patient tolerated treatment well    Behavior During Therapy [facility] for tasks assessed/performed           Past Medical History:  Diagnosis Date  . Diabetes mellitus   . GERD (gastroesophageal reflux disease)   . Heart murmur   . Hyperlipidemia   . Hypertension    on meds  . Hypothyroidism    on meds    Past Surgical History:  Procedure Laterality Date  . DENTAL SURGERY     several dental procedure approx 10 between 1999-2012  . HERNIA REPAIR     as a child  . SHOULDER ARTHROSCOPY  05/31/2011   Procedure: ARTHROSCOPY SHOULDER;  Surgeon: Sharmon Revere, MD;  Location: [place];  Service: Orthopedics;  Laterality: Left;  Shoulder Acromioplasty Left  . SHOULDER SURGERY  05/31/2011    impingment left    There were no vitals filed for this visit.   Subjective Assessment - 06/02/20 0851    Subjective I had a great trip.  I am not having any pain.    Currently in Pain? No/denies              [facility] PT Assessment - 06/02/20 0001      Observation/Other Assessments   Focus on Therapeutic Outcomes (FOTO)  67%                         [facility] Adult PT Treatment/Exercise - 06/02/20 0001      Shoulder Exercises: Standing   Diagonals Right;20 reps    Theraband Level (Shoulder Diagonals) Level 2 (Red)    Diagonals Limitations D2    Other Standing Exercises Body blade R/L and up/down x1 min each.    Other Standing Exercises Sit to/from stand with  chest press with yellow weighted ball x10, then STS with overhead press with yellow ball x10 reps.  Bicep curls up to eye level with 20 # 2x10.  Carrying 20# in R arm around gym 2 laps.      Shoulder Exercises: ROM/Strengthening   UBE (Upper Arm Bike) L2.0, 61min fwd/back    Lat Pull 20 reps    Lat Pull Limitations 25#    Cybex Press 20 reps    Cybex Press Limitations 25#    Cybex Row 20 reps    Cybex Row Limitations 25#    Wall Pushups 20 reps    Other ROM/Strengthening Exercises Triceps 25# bilat 2x10      Shoulder Exercises: Stretch   Internal Rotation Stretch 2 reps    Internal Rotation Stretch Limitations 20 sec hold bilat towel stretch      Modalities   Modalities Cryotherapy      Cryotherapy   Number Minutes Cryotherapy 10 Minutes    Cryotherapy Location Shoulder    Type of Cryotherapy Ice pack  PT Short Term Goals - 05/21/20 0931      PT SHORT TERM GOAL #1   Title Pt will be independent with HEP.    Status Achieved             PT Long Term Goals - 05/26/20 0933      PT LONG TERM GOAL #1   Status Partially Met      PT LONG TERM GOAL #2   Title Pt will increase shoulder flexion/abduction ROM to at least 150 degrees to allow him to reach overhead in cabinets.    Baseline R shoulder flexion of 165 degrees.  R shoulder abduction to 145 degrees.    Status Partially Met      PT LONG TERM GOAL #3   Title Patient to have pain no greater than 3/10 during functional and leisure activities.    Status Partially Met      PT LONG TERM GOAL #4   Title Increase strength to at least 36minus/5 to allow him to carry his camera equipment and perform household chores.    Status Partially Met                 Plan - 06/02/20 0931    Clinical Impression Statement Patient continues to make progress toward goal related activities.  His FOTO score has improved since initial evaluation, but he reports that he is still having some difficulty  washing his back.  He reports that he has been able to use his camera some, but he does need to take breaks.  He was able to progress with Cybex today for strengthening.  He continues to require skilled PT to continue to progress towards goal related activities.    PT Treatment/Interventions ADLs/Self Care Home Management;Electrical Stimulation;Iontophoresis 4mg /ml Dexamethasone;Moist Heat;Ultrasound;Functional mobility training;Therapeutic activities;Therapeutic exercise;Neuromuscular re-education;Patient/family education;Manual techniques;Passive range of motion;Dry needling    PT Next Visit Plan progress ROM and strengthening    Consulted and Agree with Plan of Care Patient           Patient will benefit from skilled therapeutic intervention in order to improve the following deficits and impairments:  Decreased range of motion,Increased fascial restricitons,Increased muscle spasms,Impaired UE functional use,Decreased activity tolerance,Pain,Hypomobility,Impaired flexibility,Improper body mechanics,Decreased strength,Increased edema  Visit Diagnosis: Chronic right shoulder pain  Other muscle spasm  Shoulder arthritis  Muscle weakness (generalized)  Localized edema     Problem List Patient Active Problem List   Diagnosis Date Noted  . Chronic right shoulder pain 04/27/2020  . Hypercalcemia 04/27/2020  . Benign prostatic hyperplasia without lower urinary tract symptoms 03/02/2020  . Rectus diastasis 08/22/2019  . Primary osteoarthritis of left hip 05/12/2019  . Vitamin D deficiency disease 11/07/2017  . Pure hypertriglyceridemia 11/07/2017  . Hypogonadism male 12/28/2016  . Erectile dysfunction 12/27/2016  . Routine general medical examination at a health care facility 12/27/2016  . Idiopathic scoliosis 11/30/2015  . Middle insomnia 02/18/2015  . Nonspecific abnormal electrocardiogram (ECG) (EKG) 07/27/2014  . Snoring 03/13/2014  . Hyperlipidemia with target LDL less than  100 06/20/2013  . Hypothyroidism (acquired) 06/20/2013  . Personal history of colonic polyps 06/20/2013  . Type II diabetes mellitus with manifestations ([place]) 03/22/2013  . HTN (hypertension) 03/22/2013    Juel Burrow, PT, DPT 06/02/2020, 9:36 AM  [place]. [place], [place], [address] Phone: [phone]   Fax:  [phone]  Name: Terry Harrington MRN: [id number] Date of Birth: 1955/12/07

## 2020-06-04 ENCOUNTER — Ambulatory Visit: Payer: Medicare HMO | Admitting: Rehabilitative and Restorative Service Providers"

## 2020-06-04 ENCOUNTER — Other Ambulatory Visit: Payer: Self-pay

## 2020-06-04 ENCOUNTER — Encounter: Payer: Self-pay | Admitting: Rehabilitative and Restorative Service Providers"

## 2020-06-04 DIAGNOSIS — R600 Localized edema: Secondary | ICD-10-CM

## 2020-06-04 DIAGNOSIS — M19019 Primary osteoarthritis, unspecified shoulder: Secondary | ICD-10-CM

## 2020-06-04 DIAGNOSIS — M25511 Pain in right shoulder: Secondary | ICD-10-CM | POA: Diagnosis not present

## 2020-06-04 DIAGNOSIS — M62838 Other muscle spasm: Secondary | ICD-10-CM

## 2020-06-04 DIAGNOSIS — G8929 Other chronic pain: Secondary | ICD-10-CM | POA: Diagnosis not present

## 2020-06-04 DIAGNOSIS — M6281 Muscle weakness (generalized): Secondary | ICD-10-CM | POA: Diagnosis not present

## 2020-06-04 NOTE — Therapy (Signed)
[place]. [place], [place], [address] Phone: [phone]   Fax:  [phone]  Physical Therapy Treatment  Patient Details  Name: Terry Harrington MRN: [id number] Date of Birth: [birth year] Referring Provider (PT): Dr Lynne Leader   Encounter Date: 06/04/2020   PT End of Session - 06/04/20 0758    Visit Number 7    Date for PT Re-Evaluation 07/09/20    PT Start Time 0754    PT Stop Time 0848    PT Time Calculation (min) 54 min    Activity Tolerance Patient tolerated treatment well    Behavior During Therapy [facility] for tasks assessed/performed           Past Medical History:  Diagnosis Date  . Diabetes mellitus   . GERD (gastroesophageal reflux disease)   . Heart murmur   . Hyperlipidemia   . Hypertension    on meds  . Hypothyroidism    on meds    Past Surgical History:  Procedure Laterality Date  . DENTAL SURGERY     several dental procedure approx 10 between 1999-2012  . HERNIA REPAIR     as a child  . SHOULDER ARTHROSCOPY  05/31/2011   Procedure: ARTHROSCOPY SHOULDER;  Surgeon: Sharmon Revere, MD;  Location: [place];  Service: Orthopedics;  Laterality: Left;  Shoulder Acromioplasty Left  . SHOULDER SURGERY  05/31/2011    impingment left    There were no vitals filed for this visit.   Subjective Assessment - 06/04/20 0757    Subjective I was a little sore after the last session, but that went away by the next day.    Currently in Pain? Yes    Pain Score 2     Pain Location Shoulder    Pain Orientation Right    Pain Descriptors / Indicators Aching;Burning                             [facility] Adult PT Treatment/Exercise - 06/04/20 0001      Shoulder Exercises: Seated   External Rotation Both;20 reps    Theraband Level (Shoulder External Rotation) Level 3 (Green)      Shoulder Exercises: Standing   Extension Strengthening;Both;20 reps    Extension Weight (lbs) 10    Extension  Limitations on [place]    Diagonals Right;20 reps    Theraband Level (Shoulder Diagonals) Level 3 (Green)    Diagonals Limitations D2    Other Standing Exercises Triceps x25# 2x10 reps bilat on [place]    Other Standing Exercises Sit to/from stand with chest press with yellow weighted ball x10, then STS with overhead press with yellow ball x10 reps.  Bicep curls up to eye level with 20 # 2x10.  Carrying 20# in R arm around gym 2 laps.      Shoulder Exercises: ROM/Strengthening   UBE (Upper Arm Bike) L2.0, 71min fwd/back    Lat Pull 20 reps    Lat Pull Limitations 25#    Cybex Press 20 reps    Cybex Press Limitations 25#    Cybex Row 20 reps    Cybex Row Limitations 25#    Wall Pushups 20 reps    Modified Plank 30 seconds;2 reps    Modified Plank Limitations on elbows      Modalities   Modalities Cryotherapy      Cryotherapy   Number Minutes Cryotherapy 10 Minutes    Cryotherapy  Location Shoulder    Type of Cryotherapy Ice pack                    PT Short Term Goals - 05/21/20 0931      PT SHORT TERM GOAL #1   Title Pt will be independent with HEP.    Status Achieved             PT Long Term Goals - 06/04/20 0844      PT LONG TERM GOAL #1   Title Pt will be independent with advanced HEP.    Status Partially Met      PT LONG TERM GOAL #3   Title Patient to have pain no greater than 3/10 during functional and leisure activities.    Status Partially Met      PT LONG TERM GOAL #4   Title Increase strength to at least 68minus/5 to allow him to carry his camera equipment and perform household chores.    Baseline R shoulder strength of 4 to 4+/5 throughout    Status Partially Met                 Plan - 06/04/20 0840    Clinical Impression Statement Patient continues to make progres towards goal related activities and increased strength.  He still reports that washing his back is a hard effort and he uses his left hand to help more.  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  He requires cuing for core stability during standing triceps and shoulder extension to ensure that he does not injure his back.  He continues to require skilled PT to progress towards goal related activities.    PT Treatment/Interventions ADLs/Self Care Home Management;Electrical Stimulation;Iontophoresis 4mg /ml Dexamethasone;Moist Heat;Ultrasound;Functional mobility training;Therapeutic activities;Therapeutic exercise;Neuromuscular re-education;Patient/family education;Manual techniques;Passive range of motion;Dry needling    PT Next Visit Plan progress ROM and strengthening    Consulted and Agree with Plan of Care Patient           Patient will benefit from skilled therapeutic intervention in order to improve the following deficits and impairments:  Decreased range of motion,Increased fascial restricitons,Increased muscle spasms,Impaired UE functional use,Decreased activity tolerance,Pain,Hypomobility,Impaired flexibility,Improper body mechanics,Decreased strength,Increased edema  Visit Diagnosis: Chronic right shoulder pain  Other muscle spasm  Shoulder arthritis  Muscle weakness (generalized)  Localized edema     Problem List Patient Active Problem List   Diagnosis Date Noted  . Chronic right shoulder pain 04/27/2020  . Hypercalcemia 04/27/2020  . Benign prostatic hyperplasia without lower urinary tract symptoms 03/02/2020  . Rectus diastasis 08/22/2019  . Primary osteoarthritis of left hip 05/12/2019  . Vitamin D deficiency disease 11/07/2017  . Pure hypertriglyceridemia 11/07/2017  . Hypogonadism male 12/28/2016  . Erectile dysfunction 12/27/2016  . Routine general medical examination at a health care facility 12/27/2016  . Idiopathic scoliosis 11/30/2015  . Middle insomnia 02/18/2015  . Nonspecific abnormal electrocardiogram (ECG) (EKG) 07/27/2014  . Snoring 03/13/2014  .  Hyperlipidemia with target LDL less than 100 06/20/2013  . Hypothyroidism (acquired) 06/20/2013  . Personal history of colonic polyps 06/20/2013  . Type II diabetes mellitus with manifestations ([place]) 03/22/2013  . HTN (hypertension) 03/22/2013    Juel Burrow, PT, DPT 06/04/2020, 8:49 AM  [place]. [place], [place], [address] Phone: [phone]   Fax:  [phone]  Name: Terry Harrington MRN: [id number] Date of Birth: 17-Mar-1955

## 2020-06-06 ENCOUNTER — Other Ambulatory Visit: Payer: Self-pay | Admitting: Internal Medicine

## 2020-06-06 DIAGNOSIS — E039 Hypothyroidism, unspecified: Secondary | ICD-10-CM

## 2020-06-08 ENCOUNTER — Encounter: Payer: Self-pay | Admitting: Physical Therapy

## 2020-06-08 ENCOUNTER — Other Ambulatory Visit: Payer: Self-pay

## 2020-06-08 ENCOUNTER — Ambulatory Visit: Payer: Medicare HMO | Admitting: Physical Therapy

## 2020-06-08 DIAGNOSIS — M25511 Pain in right shoulder: Secondary | ICD-10-CM | POA: Diagnosis not present

## 2020-06-08 DIAGNOSIS — M6281 Muscle weakness (generalized): Secondary | ICD-10-CM | POA: Diagnosis not present

## 2020-06-08 DIAGNOSIS — M62838 Other muscle spasm: Secondary | ICD-10-CM

## 2020-06-08 DIAGNOSIS — M19019 Primary osteoarthritis, unspecified shoulder: Secondary | ICD-10-CM | POA: Diagnosis not present

## 2020-06-08 DIAGNOSIS — G8929 Other chronic pain: Secondary | ICD-10-CM

## 2020-06-08 DIAGNOSIS — R600 Localized edema: Secondary | ICD-10-CM

## 2020-06-08 NOTE — Therapy (Signed)
[place]. [place], [place], [address] Phone: [phone]   Fax:  [phone]  Physical Therapy Treatment  Patient Details  Name: Terry Harrington MRN: [id number] Date of Birth: December 26, 1955 Referring Provider (PT): Dr Lynne Leader   Encounter Date: 06/08/2020   PT End of Session - 06/08/20 0917    Visit Number 8    Date for PT Re-Evaluation 07/09/20    PT Start Time 0845    PT Stop Time 0930    PT Time Calculation (min) 45 min    Activity Tolerance Patient tolerated treatment well    Behavior During Therapy [facility] for tasks assessed/performed           Past Medical History:  Diagnosis Date  . Diabetes mellitus   . GERD (gastroesophageal reflux disease)   . Heart murmur   . Hyperlipidemia   . Hypertension    on meds  . Hypothyroidism    on meds    Past Surgical History:  Procedure Laterality Date  . DENTAL SURGERY     several dental procedure approx 10 between 1999-2012  . HERNIA REPAIR     as a child  . SHOULDER ARTHROSCOPY  05/31/2011   Procedure: ARTHROSCOPY SHOULDER;  Surgeon: Sharmon Revere, MD;  Location: [facility];  Service: Orthopedics;  Laterality: Left;  Shoulder Acromioplasty Left  . SHOULDER SURGERY  05/31/2011    impingment left    There were no vitals filed for this visit.   Subjective Assessment - 06/08/20 0846    Subjective Doing all right, pt reports some improvement overall    Currently in Pain? Yes    Pain Score 1     Pain Location Shoulder    Pain Orientation Right              [facility] PT Assessment - 06/08/20 0001      AROM   Right Shoulder Flexion 165 Degrees    Right Shoulder ABduction 160 Degrees                         [facility] Adult PT Treatment/Exercise - 06/08/20 0001      Shoulder Exercises: Standing   Flexion Both;20 reps    ABduction Strengthening;Both;20 reps    Shoulder ABduction Weight (lbs) 2    Other Standing Exercises RUE ER abd to 90  yellow 2x10      Shoulder Exercises: ROM/Strengthening   UBE (Upper Arm Bike) L2.0, 70mn fwd/back    Lat Pull 20 reps    Lat Pull Limitations 25lb    Cybex Press 20 reps    Cybex Press Limitations 25#    Cybex Row 20 reps    Cybex Row Limitations 35lb    Other ROM/Strengthening Exercises RUE ER & IR  green 2x12    Other ROM/Strengthening Exercises Tricep Ext 35lb 2x10      Modalities   Modalities Cryotherapy      Cryotherapy   Number Minutes Cryotherapy 10 Minutes    Cryotherapy Location Shoulder    Type of Cryotherapy Ice pack                    PT Short Term Goals - 05/21/20 0931      PT SHORT TERM GOAL #1   Title Pt will be independent with HEP.    Status Achieved             PT Long Term Goals -  06/04/20 0844      PT LONG TERM GOAL #1   Title Pt will be independent with advanced HEP.    Status Partially Met      PT LONG TERM GOAL #3   Title Patient to have pain no greater than 3/10 during functional and leisure activities.    Status Partially Met      PT LONG TERM GOAL #4   Title Increase strength to at least 56mnus/5 to allow him to carry his camera equipment and perform household chores.    Baseline R shoulder strength of 4 to 4+/5 throughout    Status Partially Met                 Plan - 06/08/20 0919    Clinical Impression Statement Pt did well today completing all interventions. Cues needed for pacing to rest between sets.. Pt reports some muscular burning with RUE internal and external rotation. Increase resistance tolerated with seated rows. Some R shoulder elevation noted with standing flexion. Cues needed to engage core with standing triceps extension.    Personal Factors and Comorbidities Age;Fitness    Examination-Activity Limitations Bathing;Reach Overhead;Carry;Dressing;Hygiene/Grooming;Lift    Examination-Participation Restrictions Occupation;Cleaning;Yard Work    Stability/Clinical Decision Making Evolving/Moderate complexity     Rehab Potential Good    PT Frequency 2x / week    PT Treatment/Interventions ADLs/Self Care Home Management;Electrical Stimulation;Iontophoresis 465mml Dexamethasone;Moist Heat;Ultrasound;Functional mobility training;Therapeutic activities;Therapeutic exercise;Neuromuscular re-education;Patient/family education;Manual techniques;Passive range of motion;Dry needling    PT Next Visit Plan progress ROM and strengthening           Patient will benefit from skilled therapeutic intervention in order to improve the following deficits and impairments:  Decreased range of motion,Increased fascial restricitons,Increased muscle spasms,Impaired UE functional use,Decreased activity tolerance,Pain,Hypomobility,Impaired flexibility,Improper body mechanics,Decreased strength,Increased edema  Visit Diagnosis: Shoulder arthritis  Muscle weakness (generalized)  Other muscle spasm  Chronic right shoulder pain  Localized edema     Problem List Patient Active Problem List   Diagnosis Date Noted  . Chronic right shoulder pain 04/27/2020  . Hypercalcemia 04/27/2020  . Benign prostatic hyperplasia without lower urinary tract symptoms 03/02/2020  . Rectus diastasis 08/22/2019  . Primary osteoarthritis of left hip 05/12/2019  . Vitamin D deficiency disease 11/07/2017  . Pure hypertriglyceridemia 11/07/2017  . Hypogonadism male 12/28/2016  . Erectile dysfunction 12/27/2016  . Routine general medical examination at a health care facility 12/27/2016  . Idiopathic scoliosis 11/30/2015  . Middle insomnia 02/18/2015  . Nonspecific abnormal electrocardiogram (ECG) (EKG) 07/27/2014  . Snoring 03/13/2014  . Hyperlipidemia with target LDL less than 100 06/20/2013  . Hypothyroidism (acquired) 06/20/2013  . Personal history of colonic polyps 06/20/2013  . Type [place] mellitus with manifestations [place]/14/2015  . HTN (hypertension) 03/22/2013    RoScot JunPTA 06/08/2020, 9:24  AM  [id number]: [phone] Fax:  [phone]: Terry LATORRERN: [id number] of Birth: [birth year]

## 2020-06-09 ENCOUNTER — Other Ambulatory Visit: Payer: Self-pay | Admitting: Endocrinology

## 2020-06-09 NOTE — Progress Notes (Signed)
   I, Christoper Fabian, LAT, ATC, am serving as scribe for Dr. Clementeen Graham.  Terry Harrington is a 65 y.o. male who presents to [facility] at [facility] today for f/u of chronic R shoulder pain.  He was last seen by Dr. Denyse Amass on 04/29/20 and was referred to PT of which he completed 8 sessions.  Since his last visit, pt reports that his R shoulder is feeling a lot better and notes only limited pain w/ end-ranges of motion.  He no longer has pain at rest.  He rates his improvement at 80%.  Diagnostic testing: R shoulder XR- 04/27/20  Pertinent review of systems: No fevers or chills  Relevant historical information: Diabetes   Exam:  BP 104/60 (BP Location: Left Arm, Patient Position: Sitting, Cuff Size: Normal)   Pulse 83   Ht 5\' 11"  (1.803 m)   Wt 213 lb (96.6 kg)   SpO2 97%   BMI 29.71 kg/m  General: Well Developed, well nourished, and in no acute distress.   MSK: Right shoulder Normal-appearing Normal motion. Normal strength. Positive Hawkins and Neer's test.    Lab and Radiology Results DG Shoulder Right  Result Date: 04/27/2020 CLINICAL DATA:  Right shoulder pain with limited range of motion for 3-4 months. EXAM: RIGHT SHOULDER - 2+ VIEW COMPARISON:  None. FINDINGS: No acute fracture or dislocation is identified. There is mild marginal spurring at the acromioclavicular joint. The glenohumeral joint is unremarkable. The soft tissues are unremarkable. IMPRESSION: Mild acromioclavicular osteoarthrosis. Electronically Signed   By: 04/29/2020 M.D.   On: 04/27/2020 10:34   04/29/2020 LIMITED JOINT SPACE STRUCTURES UP RIGHT(NO LINKED CHARGES)  Result Date: 05/21/2020 Diagnostic Limited MSK Ultrasound of: Right shoulder Biceps tendon intact normal-appearing Subscapularis tendon normal-appearing Supraspinatus tendon is intact. Mild subacromial bursitis present. Infraspinatus tendon normal-appearing AC joint narrowed degenerative with effusion. Impression: Subacromial bursitis  and AC DJD  I, 05/23/2020, personally (independently) visualized and performed the interpretation of the xray images attached in this note.     Assessment and Plan: 65 y.o. male with right shoulder pain thought to be due predominantly to subacromial bursitis and AC DJD.  Patient has had good response to physical therapy at about 80% better.  He notes this is not quite good enough but thinks that he is continuing to improve.  After discussion plan to continue and finish out physical therapy and continue home exercise program and see how he does on his own for about 2 months.  If not better next step would probably be MRI or injection.  Patient favors MRI is too high.  He will let me know.    Discussed warning signs or symptoms. Please see discharge instructions. Patient expresses understanding.   The above documentation has been reviewed and is accurate and complete 69, M.D.    Total encounter time 20 minutes including face-to-face time with the patient and, reviewing past medical record, and charting on the date of service.   Treatment plan and options

## 2020-06-10 ENCOUNTER — Encounter: Payer: Self-pay | Admitting: Family Medicine

## 2020-06-10 ENCOUNTER — Encounter: Payer: Self-pay | Admitting: Physical Therapy

## 2020-06-10 ENCOUNTER — Ambulatory Visit: Payer: Medicare HMO | Attending: Family Medicine | Admitting: Physical Therapy

## 2020-06-10 ENCOUNTER — Ambulatory Visit (INDEPENDENT_AMBULATORY_CARE_PROVIDER_SITE_OTHER): Payer: Medicare HMO | Admitting: Family Medicine

## 2020-06-10 ENCOUNTER — Other Ambulatory Visit: Payer: Self-pay

## 2020-06-10 VITALS — BP 104/60 | HR 83 | Ht 71.0 in | Wt 213.0 lb

## 2020-06-10 DIAGNOSIS — M19019 Primary osteoarthritis, unspecified shoulder: Secondary | ICD-10-CM | POA: Insufficient documentation

## 2020-06-10 DIAGNOSIS — M62838 Other muscle spasm: Secondary | ICD-10-CM | POA: Diagnosis not present

## 2020-06-10 DIAGNOSIS — M25511 Pain in right shoulder: Secondary | ICD-10-CM | POA: Insufficient documentation

## 2020-06-10 DIAGNOSIS — G8929 Other chronic pain: Secondary | ICD-10-CM

## 2020-06-10 DIAGNOSIS — R600 Localized edema: Secondary | ICD-10-CM | POA: Insufficient documentation

## 2020-06-10 DIAGNOSIS — M6281 Muscle weakness (generalized): Secondary | ICD-10-CM | POA: Diagnosis not present

## 2020-06-10 NOTE — Therapy (Signed)
[address]. [place], [place], [address] Phone: [phone]   Fax:  [phone]  Physical Therapy Treatment  Patient Details  Name: Terry Harrington MRN: [id number] Date of Birth: January 27, 1955 Referring Provider (PT): Dr Lynne Leader   Encounter Date: 06/10/2020   PT End of Session - 06/10/20 0916    Visit Number 9    Date for PT Re-Evaluation 07/09/20    PT Start Time 0845    PT Stop Time 0930    PT Time Calculation (min) 45 min    Activity Tolerance Patient tolerated treatment well    Behavior During Therapy [facility] for tasks assessed/performed           Past Medical History:  Diagnosis Date  . Diabetes mellitus   . GERD (gastroesophageal reflux disease)   . Heart murmur   . Hyperlipidemia   . Hypertension    on meds  . Hypothyroidism    on meds    Past Surgical History:  Procedure Laterality Date  . DENTAL SURGERY     several dental procedure approx 10 between 1999-2012  . HERNIA REPAIR     as a child  . SHOULDER ARTHROSCOPY  05/31/2011   Procedure: ARTHROSCOPY SHOULDER;  Surgeon: Sharmon Revere, MD;  Location: [place];  Service: Orthopedics;  Laterality: Left;  Shoulder Acromioplasty Left  . SHOULDER SURGERY  05/31/2011    impingment left    There were no vitals filed for this visit.   Subjective Assessment - 06/10/20 0847    Subjective Doing good, very little pain    Currently in Pain? Yes    Pain Score 1     Pain Location Shoulder    Pain Orientation Right                             [facility] Adult PT Treatment/Exercise - 06/10/20 0001      Shoulder Exercises: Standing   External Rotation Right;Strengthening;20 reps;Theraband    Theraband Level (Shoulder External Rotation) Level 3 (Green)    Teaching laboratory technician;Theraband;Right    Theraband Level (Shoulder Internal Rotation) Level 3 (Green)    Diagonals Right;20 reps    Theraband Level (Shoulder Diagonals) Level 3  (Green)    Diagonals Limitations D2    Other Standing Exercises Flex/abs combo 3lb x10 each    Other Standing Exercises Pulley shoulder Ext 10lb 2x10      Shoulder Exercises: ROM/Strengthening   UBE (Upper Arm Bike) L2.0, 91min fwd/back    Lat Pull 20 reps    Lat Pull Limitations 35lb    Cybex Row 20 reps    Cybex Row Limitations 35lb      Modalities   Modalities Cryotherapy      Cryotherapy   Number Minutes Cryotherapy 10 Minutes    Cryotherapy Location Shoulder    Type of Cryotherapy Ice pack                    PT Short Term Goals - 05/21/20 0931      PT SHORT TERM GOAL #1   Title Pt will be independent with HEP.    Status Achieved             PT Long Term Goals - 06/04/20 0844      PT LONG TERM GOAL #1   Title Pt will be independent with advanced HEP.    Status Partially Met  PT LONG TERM GOAL #3   Title Patient to have pain no greater than 3/10 during functional and leisure activities.    Status Partially Met      PT LONG TERM GOAL #4   Title Increase strength to at least 51minus/5 to allow him to carry his camera equipment and perform household chores.    Baseline R shoulder strength of 4 to 4+/5 throughout    Status Partially Met                 Plan - 06/10/20 0916    Clinical Impression Statement All interventions completed well. Some UE burning reported with flex/abd combo, also some R shoulder elevation with abduction. Visible RUE shaking noted with standing D2 flexion. Increase resistance tolerated with lat pull downs. Tactile cues for posture needed with shoulder extension.    Personal Factors and Comorbidities Age;Fitness    Examination-Activity Limitations Bathing;Reach Overhead;Carry;Dressing;Hygiene/Grooming;Lift    Examination-Participation Restrictions Occupation;Cleaning;Yard Work    Merchant [employer] officer Evolving/Moderate complexity    Rehab Potential Good    PT Frequency 2x / week    PT Duration 8 weeks     PT Treatment/Interventions ADLs/Self Care Home Management;Electrical Stimulation;Iontophoresis 4mg /ml Dexamethasone;Moist Heat;Ultrasound;Functional mobility training;Therapeutic activities;Therapeutic exercise;Neuromuscular re-education;Patient/family education;Manual techniques;Passive range of motion;Dry needling    PT Next Visit Plan progress ROM and strengthening           Patient will benefit from skilled therapeutic intervention in order to improve the following deficits and impairments:  Decreased range of motion,Increased fascial restricitons,Increased muscle spasms,Impaired UE functional use,Decreased activity tolerance,Pain,Hypomobility,Impaired flexibility,Improper body mechanics,Decreased strength,Increased edema  Visit Diagnosis: Muscle weakness (generalized)  Other muscle spasm  Chronic right shoulder pain  Shoulder arthritis  Localized edema     Problem List Patient Active Problem List   Diagnosis Date Noted  . Chronic right shoulder pain 04/27/2020  . Hypercalcemia 04/27/2020  . Benign prostatic hyperplasia without lower urinary tract symptoms 03/02/2020  . Rectus diastasis 08/22/2019  . Primary osteoarthritis of left hip 05/12/2019  . Vitamin D deficiency disease 11/07/2017  . Pure hypertriglyceridemia 11/07/2017  . Hypogonadism male 12/28/2016  . Erectile dysfunction 12/27/2016  . Routine general medical examination at a health care facility 12/27/2016  . Idiopathic scoliosis 11/30/2015  . Middle insomnia 02/18/2015  . Nonspecific abnormal electrocardiogram (ECG) (EKG) 07/27/2014  . Snoring 03/13/2014  . Hyperlipidemia with target LDL less than 100 06/20/2013  . Hypothyroidism (acquired) 06/20/2013  . Personal history of colonic polyps 06/20/2013  . Type II diabetes mellitus with manifestations ([place]) 03/22/2013  . HTN (hypertension) 03/22/2013    Terry Harrington, PTA 06/10/2020, 9:19 AM  [address]. [place], [place], [address] Phone: [phone]   Fax:  [phone]  Name: Terry Harrington MRN: [id number] Date of Birth: October 11, 1955

## 2020-06-10 NOTE — Patient Instructions (Signed)
Thank you for coming in today.  Finish out PT.  Continue home exercises Recheck in about 2 months.  Next step is not good enough is either injection or MRI.   Let me know how you are doing.

## 2020-06-21 ENCOUNTER — Other Ambulatory Visit: Payer: Self-pay

## 2020-06-21 ENCOUNTER — Encounter: Payer: Self-pay | Admitting: Rehabilitative and Restorative Service Providers"

## 2020-06-21 ENCOUNTER — Ambulatory Visit: Payer: Medicare HMO | Admitting: Rehabilitative and Restorative Service Providers"

## 2020-06-21 DIAGNOSIS — M6281 Muscle weakness (generalized): Secondary | ICD-10-CM

## 2020-06-21 DIAGNOSIS — M25511 Pain in right shoulder: Secondary | ICD-10-CM | POA: Diagnosis not present

## 2020-06-21 DIAGNOSIS — G8929 Other chronic pain: Secondary | ICD-10-CM

## 2020-06-21 DIAGNOSIS — M19019 Primary osteoarthritis, unspecified shoulder: Secondary | ICD-10-CM | POA: Diagnosis not present

## 2020-06-21 DIAGNOSIS — M62838 Other muscle spasm: Secondary | ICD-10-CM | POA: Diagnosis not present

## 2020-06-21 DIAGNOSIS — R600 Localized edema: Secondary | ICD-10-CM | POA: Diagnosis not present

## 2020-06-21 NOTE — Therapy (Signed)
[place]. [place], [place], [address] Phone: [phone]   Fax:  [phone]  Physical Therapy Treatment and Discharge Summary  Patient Details  Name: Terry Harrington MRN: [id number] Date of Birth: [birth year] Referring Provider (PT): Dr Lynne Leader   Encounter Date: 06/21/2020   PT End of Session - 06/21/20 1020     Visit Number 10    Date for PT Re-Evaluation 07/09/20    PT Start Time 1015    PT Stop Time 1055    PT Time Calculation (min) 40 min    Activity Tolerance Patient tolerated treatment well    Behavior During Therapy [facility] for tasks assessed/performed             Past Medical History:  Diagnosis Date   Diabetes mellitus    GERD (gastroesophageal reflux disease)    Heart murmur    Hyperlipidemia    Hypertension    on meds   Hypothyroidism    on meds    Past Surgical History:  Procedure Laterality Date   DENTAL SURGERY     several dental procedure approx 10 between 1999-2012   [place]     as a child   SHOULDER ARTHROSCOPY  05/31/2011   Procedure: ARTHROSCOPY SHOULDER;  Surgeon: Sharmon Revere, MD;  Location: [place];  Service: Orthopedics;  Laterality: Left;  Shoulder Acromioplasty Left   SHOULDER SURGERY  05/31/2011    impingment left    There were no vitals filed for this visit.   Subjective Assessment - 06/21/20 1019     Subjective My shoulder is doing great.  My back has started hurting me.    Pertinent History L shoulder surgery in past to remove bone spur    Patient Stated Goals To get my range of motion back and be able to wash my back.  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    Currently in Pain? Yes    Pain Score 1     Pain Location Shoulder    Pain Orientation Right    Pain Descriptors / Indicators Sore    Pain Type Chronic pain                [facility] PT Assessment - 06/21/20 0001       Observation/Other Assessments   Focus on Therapeutic  Outcomes (FOTO)  70%      Strength   Overall Strength Comments R shoulder strength of 5/5 grossly throughout, with R abduction of 53minus/5                           [facility] Adult PT Treatment/Exercise - 06/21/20 0001       Shoulder Exercises: Seated   Other Seated Exercises sit to/from stand with blue weighted ball x10 with CP, x10 with OHP      Shoulder Exercises: Prone   Other Prone Exercises Alt UE/LE extension x10 each      Shoulder Exercises: Standing   External Rotation Right;Strengthening;20 reps;Theraband    Theraband Level (Shoulder External Rotation) Level 3 (Green)    Teaching laboratory technician;Theraband;Right    Theraband Level (Shoulder Internal Rotation) Level 3 (Green)    Extension Strengthening;Both;20 reps    Extension Weight (lbs) 10    Extension Limitations Power Tower    Diagonals Right;20 reps    Theraband Level (Shoulder Diagonals) Level 3 (Green)    Diagonals Limitations D2  Shoulder Exercises: ROM/Strengthening   UBE (Upper Arm Bike) L2.0, 55min fwd/back    Lat Pull 20 reps    Lat Pull Limitations 35lb                      PT Short Term Goals - 05/21/20 0931       PT SHORT TERM GOAL #1   Title Pt will be independent with HEP.    Status Achieved               PT Long Term Goals - 06/21/20 1048       PT LONG TERM GOAL #1   Title Pt will be independent with advanced HEP.    Status Achieved      PT LONG TERM GOAL #2   Title Pt will increase shoulder flexion/abduction ROM to at least 150 degrees to allow him to reach overhead in cabinets.    Status Achieved      PT LONG TERM GOAL #3   Title Patient to have pain no greater than 3/10 during functional and leisure activities.    Status Achieved      PT LONG TERM GOAL #4   Title Increase strength to at least 37minus/5 to allow him to carry his camera equipment and perform household chores.    Status Achieved                   Plan - 06/21/20  1052     Clinical Impression Statement Patient has met all PT goals at this time and is ready for discharge with HEP.  He will continue to follow up with Dr Georgina Snell, as needed, and states that he may get a MRI next if pain persists.  He reports that he is able to use his camera to take photos again and is able to have improved ROM to perform normal ADLs and IADLs.  Patient discharged with HEP at this time.    PT Treatment/Interventions ADLs/Self Care Home Management;Electrical Stimulation;Iontophoresis 4mg /ml Dexamethasone;Moist Heat;Ultrasound;Functional mobility training;Therapeutic activities;Therapeutic exercise;Neuromuscular re-education;Patient/family education;Manual techniques;Passive range of motion;Dry needling    PT Next Visit Plan Discharge pt    PT Home Exercise Plan issued HEP  Access Code: [email or web address]    Consulted and Agree with Plan of Care Patient             Patient will benefit from skilled therapeutic intervention in order to improve the following deficits and impairments:  Decreased range of motion, Increased fascial restricitons, Increased muscle spasms, Impaired UE functional use, Decreased activity tolerance, Pain, Hypomobility, Impaired flexibility, Improper body mechanics, Decreased strength, Increased edema  Visit Diagnosis: Muscle weakness (generalized)  Other muscle spasm  Chronic right shoulder pain  Shoulder arthritis     Problem List Patient Active Problem List   Diagnosis Date Noted   Chronic right shoulder pain 04/27/2020   Hypercalcemia 04/27/2020   Benign prostatic hyperplasia without lower urinary tract symptoms 03/02/2020   Rectus diastasis 08/22/2019   Primary osteoarthritis of left hip 05/12/2019   Vitamin D deficiency disease 11/07/2017   Pure hypertriglyceridemia 11/07/2017   Hypogonadism male 12/28/2016   Erectile dysfunction 12/27/2016   Routine general medical examination at a health care facility 12/27/2016   Idiopathic scoliosis  11/30/2015   Middle insomnia 02/18/2015   Nonspecific abnormal electrocardiogram (ECG) (EKG) 07/27/2014   Snoring 03/13/2014   Hyperlipidemia with target LDL less than 100 06/20/2013   Hypothyroidism (acquired) 06/20/2013   Personal history of colonic polyps 06/20/2013  Type II diabetes mellitus with manifestations ([place]) 03/22/2013   HTN (hypertension) 03/22/2013    PHYSICAL THERAPY DISCHARGE SUMMARY  Visits from Start of Care: 9  Patient agrees to discharge. Patient goals were met. Patient is being discharged due to meeting the stated rehab goals.    Juel Burrow, PT, DPT 06/21/2020, 11:01 AM  [address]. [place], [place], [address] Phone: [phone]   Fax:  [phone]  Name: Terry Harrington MRN: [id number] Date of Birth: 1955/08/30

## 2020-06-23 ENCOUNTER — Ambulatory Visit: Payer: Medicare HMO | Admitting: Rehabilitative and Restorative Service Providers"

## 2020-06-24 ENCOUNTER — Telehealth: Payer: Self-pay | Admitting: Family Medicine

## 2020-06-24 DIAGNOSIS — G8929 Other chronic pain: Secondary | ICD-10-CM

## 2020-06-24 NOTE — Telephone Encounter (Signed)
Patient called stating that he would to proceed with the MRI that was discussed at his last visit. Can this order be put in for him please?

## 2020-06-25 NOTE — Telephone Encounter (Signed)
Spoke w/ pt and informed him Dr. Denyse Amass ordered an MRI.

## 2020-06-25 NOTE — Telephone Encounter (Signed)
MRI ordered for [facility] in [place].  You should hear soon about scheduling.  Please contact me if you do not hear anything in the near future.

## 2020-06-28 ENCOUNTER — Ambulatory Visit: Payer: Medicare HMO | Admitting: Physical Therapy

## 2020-06-30 ENCOUNTER — Telehealth: Payer: Self-pay | Admitting: Family Medicine

## 2020-06-30 ENCOUNTER — Ambulatory Visit: Payer: Medicare HMO | Admitting: Physical Therapy

## 2020-06-30 NOTE — Telephone Encounter (Signed)
Conducted peer to peer conversation with insurance company today. MRI shoulder approved.  Authorization: [id number] Expiration December 27, 2020

## 2020-07-02 ENCOUNTER — Ambulatory Visit: Payer: BC Managed Care – PPO | Admitting: Plastic Surgery

## 2020-07-03 ENCOUNTER — Ambulatory Visit (INDEPENDENT_AMBULATORY_CARE_PROVIDER_SITE_OTHER): Payer: Medicare HMO

## 2020-07-03 ENCOUNTER — Other Ambulatory Visit: Payer: Self-pay

## 2020-07-03 DIAGNOSIS — G8929 Other chronic pain: Secondary | ICD-10-CM

## 2020-07-03 DIAGNOSIS — M25511 Pain in right shoulder: Secondary | ICD-10-CM

## 2020-07-05 NOTE — Progress Notes (Signed)
MRI right shoulder shows a small rotator cuff tear of the supraspinatus tendon.  This is the tendon that brings the arm up over the head and you have arthritis of the small joint at the top of the shoulder that the tendon has to go underneath [facility] joint).  We could try treating with injection however I do think home exercise program and a bit more time would probably help.  Surgery obviously could help the future if needed.  If you would like to schedule a return to clinic to go over the results of full detail please do so.

## 2020-07-05 NOTE — Progress Notes (Signed)
I, Terry Harrington, LAT, ATC, am serving as scribe for Dr. Clementeen Harrington.  Terry Harrington is a 65 y.o. male who presents to [facility] at [facility] today for f/u of R shoulder pain and R shoulder MRI review.  He was last seen by Dr. Denyse Harrington on 06/10/20 and noted 80% improvement in his symptoms.  He has completed 10 PT sessions and has been d/c.  Since his last visit, pt reports that his shoulder is a tad bit worse."  Pt has been completing his HEP 3x/week.  Diagnostic testing: R shoulder MRI- 07/03/20; R shoulder XR- 04/27/20  Pertinent review of systems: No fevers or chills  Relevant historical information: Diabetes well controlled   Exam:  BP 120/66 (BP Location: Right Arm, Patient Position: Sitting, Cuff Size: Normal)   Pulse 74   Ht 5\' 11"  (1.803 m)   Wt 212 lb 6.4 oz (96.3 kg)   SpO2 99%   BMI 29.62 kg/m  General: Well Developed, well nourished, and in no acute distress.   MSK: Right shoulder normal-appearing Tender palpation AC joint. Pain with crossover arm compression test. Intact strength.    Lab and Radiology Results  Procedure: Real-time Ultrasound Guided Injection of right AC joint superior approach Device: Philips Affiniti 50G Images permanently stored and available for review in PACS Verbal informed consent obtained.  Discussed risks and benefits of procedure. Warned about infection bleeding damage to structures skin hypopigmentation and fat atrophy among others. Patient expresses understanding and agreement Time-out conducted.   Noted no overlying erythema, induration, or other signs of local infection.   Skin prepped in a sterile fashion.   Local anesthesia: Topical Ethyl chloride.   With sterile technique and under real time ultrasound guidance: 40 mg of Kenalog and 1 mL Marcaine injected into AC joint. Fluid seen entering the joint capsule.   Completed without difficulty   Pain immediately resolved suggesting accurate placement of the  medication.   Advised to call if fevers/chills, erythema, induration, drainage, or persistent bleeding.   Images permanently stored and available for review in the ultrasound unit.  Impression: Technically successful ultrasound guided injection.      EXAM: MRI OF THE RIGHT SHOULDER WITHOUT CONTRAST   TECHNIQUE: Multiplanar, multisequence MR imaging of the shoulder was performed. No intravenous contrast was administered.   COMPARISON:  Right shoulder x-rays dated Terry 19, Harrington.   FINDINGS: Rotator cuff: Small focal low-grade partial-thickness articular surface tear of supraspinatus tendon at the critical zone. The infraspinatus, teres minor, and subscapularis tendons are intact.   Muscles: No atrophy or abnormal signal of the muscles of the rotator cuff.   Biceps long head:  Intact and normally positioned.   Acromioclavicular Joint: Moderate arthropathy of the acromioclavicular joint. Type II acromion. No subacromial/subdeltoid bursal fluid.   Glenohumeral Joint: No joint effusion. No chondral defect.   Labrum: Grossly intact, but evaluation is limited by lack of intraarticular fluid.   Bones: Reactive subcortical changes in the greater tuberosity. No marrow abnormality, fracture or dislocation.   Other: None.   IMPRESSION: 1. Small focal low-grade partial-thickness articular surface tear of the supraspinatus tendon at the critical zone. 2. Moderate acromioclavicular osteoarthritis.     Electronically Signed   By: Terry Harrington M.D.   On: 06/26/Harrington 17:16    I, 06/28/Harrington, personally (independently) visualized and performed the interpretation of the images attached in this note.      Assessment and Plan: 65 y.o. male with right shoulder pain.  Pain  thought to be multifactorial primarily due to [facility] joint pain and mild rotator cuff tendinopathy and partial tear.  Patient has had significant improvement in pain with PT.  I think the pain that improved is  probably due to the rotator cuff related pain which would improve with PT.  Remaining pain thought to be related to [facility] DJD. Injection today helped quite a bit.  Patient had immediate pain relief.  If not improving much after injection patient will let me know and we will refer to surgery for surgical consultation potential distal clavicle excision and subacromial decompression.   PDMP not reviewed this encounter. Orders Placed This Encounter  Procedures   [place] LIMITED JOINT SPACE STRUCTURES UP RIGHT(NO LINKED CHARGES)    Order Specific Question:   Reason for Exam (SYMPTOM  OR DIAGNOSIS REQUIRED)    Answer:   R shoulder pain    Order Specific Question:   Preferred imaging location?    Answer:    [facility]   No orders of the defined types were placed in this encounter.    Discussed warning signs or symptoms. Please see discharge instructions. Patient expresses understanding.   The above documentation has been reviewed and is accurate and complete Terry Harrington, M.D.

## 2020-07-07 ENCOUNTER — Ambulatory Visit: Payer: Medicare HMO | Admitting: Family Medicine

## 2020-07-07 ENCOUNTER — Encounter: Payer: Self-pay | Admitting: Family Medicine

## 2020-07-07 ENCOUNTER — Other Ambulatory Visit: Payer: Self-pay

## 2020-07-07 ENCOUNTER — Ambulatory Visit: Payer: Self-pay

## 2020-07-07 VITALS — BP 120/66 | HR 74 | Ht 71.0 in | Wt 212.4 lb

## 2020-07-07 DIAGNOSIS — G8929 Other chronic pain: Secondary | ICD-10-CM | POA: Diagnosis not present

## 2020-07-07 DIAGNOSIS — M25511 Pain in right shoulder: Secondary | ICD-10-CM | POA: Diagnosis not present

## 2020-07-07 NOTE — Patient Instructions (Signed)
Thank you for coming in today.   Call or go to the ER if you develop a large red swollen joint with extreme pain or oozing puss.    Continue the home exercises.   If not improved or the shot does not last very long let me know.  Surgery may make sense at that point.   Recheck with me as needed.

## 2020-07-23 ENCOUNTER — Other Ambulatory Visit: Payer: Self-pay

## 2020-07-23 ENCOUNTER — Ambulatory Visit (INDEPENDENT_AMBULATORY_CARE_PROVIDER_SITE_OTHER): Payer: Medicare HMO | Admitting: Endocrinology

## 2020-07-23 ENCOUNTER — Encounter: Payer: Self-pay | Admitting: Endocrinology

## 2020-07-23 VITALS — BP 140/82 | HR 75 | Ht 71.0 in | Wt 211.0 lb

## 2020-07-23 DIAGNOSIS — E118 Type 2 diabetes mellitus with unspecified complications: Secondary | ICD-10-CM

## 2020-07-23 LAB — POCT GLYCOSYLATED HEMOGLOBIN (HGB A1C): Hemoglobin A1C: 6.2 % — AB (ref 4.0–5.6)

## 2020-07-23 MED ORDER — TESTOSTERONE 12.5 MG/ACT (1%) TD GEL: 1.0000 | Freq: Every day | TRANSDERMAL | 1 refills | Status: DC

## 2020-07-23 NOTE — Patient Instructions (Addendum)
Please continue the same 4 diabetes medications.   check your blood sugar once a day.  vary the time of day when you check, between before the 3 meals, and at bedtime.  also check if you have symptoms of your blood sugar being too high or too low.  please keep a record of the readings and bring it to your next appointment here (or you can bring the meter itself).  You can write it on any piece of paper.  please call us sooner if your blood sugar goes below 70, or if you have a lot of readings over 200. I have sent a prescription to your pharmacy, for the testosterone gel.   Please come back for a follow-up appointment in 3 months.

## 2020-07-23 NOTE — Progress Notes (Signed)
Subjective:    Patient ID: Terry Harrington, male    DOB: [birth year], 65 y.o.   MRN: [id number]  HPI Pt returns for f/u of diabetes mellitus:  DM type: 2 Dx'ed: 2010 Complications: stage 3 CRI Therapy: Trulicity and 3 oral meds.  DKA: never.  Severe hypoglycemia: never.  Pancreatitis: never Other: he took insulin 2015-2017; in early 2017, he was hospitalized for nonketotic hyperosmolar hyperglycemic state, due to missing insulin doses, he did not tolerate farxiga (arthralgias); he works M-F, day shift.   Interval history: pt says cbg's are well-controlled.  He takes meds as rx'ed.   Pt also has primary hypogonadism (dx'ed 2018; he has lifelong infertility, but karyotype was normal; he took androgel 2012-2014; testosterone level was low, but he is on max safe dosage (50 mg/d)).  He has not recently taken the androgel.   Past Medical History:  Diagnosis Date   Diabetes mellitus    GERD (gastroesophageal reflux disease)    Heart murmur    Hyperlipidemia    Hypertension    on meds   Hypothyroidism    on meds    Past Surgical History:  Procedure Laterality Date   DENTAL SURGERY     several dental procedure approx 10 between 1999-2012   HERNIA REPAIR     as a child   SHOULDER ARTHROSCOPY  05/31/2011   Procedure: ARTHROSCOPY SHOULDER;  Surgeon: Kennieth Rad, MD;  Location: [facility] OR;  Service: Orthopedics;  Laterality: Left;  Shoulder Acromioplasty Left   SHOULDER SURGERY  05/31/2011    impingment left    Social History   Socioeconomic History   Marital status: Divorced    Spouse name: Not on file   Number of children: Not on file   Years of education: Not on file   Highest education level: Not on file  Occupational History   Occupation: Holiday representative  Tobacco Use   Smoking status: Former    Packs/day: 1.50    Years: 26.00    Pack years: 39.00    Types: Cigarettes    Quit date: 03/02/2008    Years since quitting: 12.4   Smokeless tobacco: Former    Quit date:  05/25/2008  Substance and Sexual Activity   Alcohol use: Yes    Alcohol/week: 0.0 standard drinks    Comment: rarely   Drug use: No   Sexual activity: Not Currently  Other Topics Concern   Not on file  Social History Narrative   Not on file   Social Determinants of Health   Financial Resource Strain: Not on file  Food Insecurity: Not on file  Transportation Needs: Not on file  Physical Activity: Not on file  Stress: Not on file  Social Connections: Not on file  Intimate Partner Violence: Not on file    Current Outpatient Medications on File Prior to Visit  Medication Sig Dispense Refill   amlodipine-olmesartan (AZOR) 10-20 MG tablet Take 1 tablet by mouth daily. 90 tablet 1   Cholecalciferol (VITAMIN D3) 1.25 MG (50000 UT) CAPS TAKE 1 CAPSULE BY MOUTH ONE TIME PER WEEK 12 capsule 0   Dulaglutide (TRULICITY) 4.5 MG/0.5ML SOPN Inject 0.5 mLs (4.5 mg total) as directed once a week. 12 pen 3   fenofibrate (TRICOR) 48 MG tablet TAKE 1 TABLET BY MOUTH EVERY DAY 90 tablet 1   JARDIANCE 25 MG TABS tablet TAKE 1 TABLET BY MOUTH EVERY DAY 90 tablet 1   levothyroxine (SYNTHROID) 100 MCG tablet TAKE 1 TABLET BY MOUTH EVERY DAY  90 tablet 0   loratadine (CLARITIN) 10 MG tablet Take 10 mg by mouth daily.     meclizine (ANTIVERT) 25 MG tablet Take 1 tablet (25 mg total) by mouth 3 (three) times daily as needed for dizziness. 90 tablet 3   metFORMIN (GLUCOPHAGE) 500 MG tablet TAKE 1 TABLET BY MOUTH 2 TIMES DAILY WITH A MEAL. 180 tablet 1   Multiple Vitamins-Minerals (PRESERVISION AREDS 2) CAPS Take by mouth.     pioglitazone (ACTOS) 45 MG tablet TAKE 1 TABLET BY MOUTH EVERY DAY 90 tablet 3   pravastatin (PRAVACHOL) 40 MG tablet TAKE 1 TABLET BY MOUTH EVERY DAY 90 tablet 1   vitamin B-12 (CYANOCOBALAMIN) 1000 MCG tablet Take 2,000 mcg by mouth daily.     No current facility-administered medications on file prior to visit.    Allergies  Allergen Reactions   Crab [Shellfish Allergy]  Anaphylaxis   Iodine Anaphylaxis   Lisinopril     cough   Crestor [Rosuvastatin]     Muscle aches   Farxiga [Dapagliflozin] Other (See Comments)    Joint pain and weakness   Iohexol Swelling   Peanut-Containing Drug Products Itching    And whelps   Aspirin Rash   Codeine Rash   Penicillins Rash    Has patient had a PCN reaction causing immediate rash, facial/tongue/throat swelling, SOB or lightheadedness with hypotension: Yes Has patient had a PCN reaction causing severe rash involving mucus membranes or skin necrosis: Yes Has patient had a PCN reaction that required hospitalization No Has patient had a PCN reaction occurring within the last 10 years: No If all of the above answers are "NO", then may proceed with Cephalosporin use.     Family History  Problem Relation Age of Onset   Hyperlipidemia Mother    Hypertension Mother    Stroke Father    Kidney disease Father    Diabetes Father    Cancer Maternal Grandfather    Anesthesia problems Neg Hx    Hypotension Neg Hx    Malignant hyperthermia Neg Hx    Pseudochol deficiency Neg Hx     BP 140/82 (BP Location: Left Arm, Patient Position: Sitting, Cuff Size: Normal)   Pulse 75   Ht 5\' 11"  (1.803 m)   Wt 211 lb (95.7 kg)   SpO2 98%   BMI 29.43 kg/m    Review of Systems Denies n/v.      Objective:   Physical Exam Pulses: dorsalis pedis intact bilat.   MSK: no deformity of the feet CV: no leg edema Skin:  no ulcer on the feet.  normal color and temp on the feet. Neuro: sensation is intact to touch on the feet  Lab Results  Component Value Date   HGBA1C 6.2 (A) 07/23/2020   Lab Results  Component Value Date   TESTOSTERONE 221 (L) 09/10/2018      Assessment & Plan:  Type 2 DM: well-controlled Low testosterone: uncontrolled  Patient Instructions  Please continue the same 4 diabetes medications.   check your blood sugar once a day.  vary the time of day when you check, between before the 3 meals, and at  bedtime.  also check if you have symptoms of your blood sugar being too high or too low.  please keep a record of the readings and bring it to your next appointment here (or you can bring the meter itself).  You can write it on any piece of paper.  please call 11/10/2018 sooner if your blood  sugar goes below 70, or if you have a lot of readings over 200. I have sent a prescription to your pharmacy, for the testosterone gel.   Please come back for a follow-up appointment in 3 months.

## 2020-07-26 NOTE — Telephone Encounter (Signed)
Pt stated that his medication needs a PA , can you look into this , Thanks.

## 2020-07-27 ENCOUNTER — Telehealth: Payer: Self-pay | Admitting: Pharmacy Technician

## 2020-07-27 NOTE — Telephone Encounter (Addendum)
Patient Advocate Encounter   Received notification from MEDICARE PART D that prior authorization for TESTOSTERONE is required.   PA submitted on 07/27/2020 Key [id number] Status is APPROVED 01/10/2020 - 01/08/2021    [facility] will continue to follow.   Netty Starring. Dimas Aguas, CPhT Patient Advocate  [facility] Clinic Phone: [phone] Fax:  [phone]

## 2020-08-02 ENCOUNTER — Other Ambulatory Visit: Payer: Self-pay

## 2020-08-02 ENCOUNTER — Encounter: Payer: Self-pay | Admitting: Plastic Surgery

## 2020-08-02 ENCOUNTER — Ambulatory Visit (INDEPENDENT_AMBULATORY_CARE_PROVIDER_SITE_OTHER): Payer: Medicare HMO | Admitting: Plastic Surgery

## 2020-08-02 VITALS — Wt 212.0 lb

## 2020-08-02 DIAGNOSIS — M6208 Separation of muscle (nontraumatic), other site: Secondary | ICD-10-CM | POA: Diagnosis not present

## 2020-08-02 DIAGNOSIS — E118 Type 2 diabetes mellitus with unspecified complications: Secondary | ICD-10-CM | POA: Diagnosis not present

## 2020-08-02 DIAGNOSIS — E039 Hypothyroidism, unspecified: Secondary | ICD-10-CM | POA: Diagnosis not present

## 2020-08-02 NOTE — Progress Notes (Signed)
   Subjective:    Patient ID: Terry Harrington, male    DOB: Oct 02, 1955, 65 y.o.   MRN: [id number]  The patient is a 65 year old gentleman here for follow-up on his abdomen.  The patient is 5 feet 9 inches tall and weighs 212 pounds.  He has been able to lose some weight since his original appointment.  Repair as a child but no other abdominal surgery.  He complains of abdominal pain especially when his girlfriend lays her head on his abdomen.  He has a bulge that is a 10 x 15 cm area.  It is pretty dramatic when he sits up and appears to be a diastases.  The general surgeon has ruled out a hernia.  He has diabetes and scoliosis, he also has hypertension and hyperlipidemia.  He quit smoking several years ago.  He states physical therapy did not help.  He still working at a [employer].  He is working on his weight reduction and has been very successful in keeping his weight down.  He agreed to go to the healthy weight and wellness center but has not received a call from them yet.     Review of Systems  Constitutional: Negative.   HENT: Negative.    Eyes: Negative.   Respiratory: Negative.    Cardiovascular: Negative.   Gastrointestinal: Negative.   Endocrine: Negative.   Genitourinary: Negative.   Neurological: Negative.   Hematological: Negative.   Psychiatric/Behavioral: Negative.        Objective:   Physical Exam Vitals and nursing note reviewed.  Constitutional:      Appearance: Normal appearance.  HENT:     Head: Normocephalic and atraumatic.  Cardiovascular:     Rate and Rhythm: Normal rate.     Pulses: Normal pulses.  Pulmonary:     Effort: Pulmonary effort is normal.  Abdominal:     General: Abdomen is flat.     Palpations: There is no mass.     Tenderness: There is abdominal tenderness. There is no guarding or rebound.  Skin:    General: Skin is warm and dry.     Coloration: Skin is not jaundiced.     Findings: No bruising.  Neurological:     General: No focal  deficit present.     Mental Status: He is alert.        Assessment & Plan:     ICD-10-CM   1. Type II diabetes mellitus with manifestations (HCC)  E11.8     2. Hypothyroidism (acquired)  E03.9     3. Rectus diastasis  M62.08       Plan for continued weight reduction.  We will also work on getting him a referral to the healthy weight and wellness center back in 6 months.  Pictures were obtained of the patient and placed in the chart with the patient's or guardian's permission.

## 2020-08-03 ENCOUNTER — Ambulatory Visit: Payer: Medicare HMO | Admitting: Plastic Surgery

## 2020-08-05 ENCOUNTER — Encounter: Payer: Self-pay | Admitting: Plastic Surgery

## 2020-08-10 ENCOUNTER — Encounter (INDEPENDENT_AMBULATORY_CARE_PROVIDER_SITE_OTHER): Payer: Self-pay | Admitting: Family Medicine

## 2020-08-10 ENCOUNTER — Ambulatory Visit: Payer: Medicare HMO | Admitting: Family Medicine

## 2020-08-10 ENCOUNTER — Encounter: Payer: Self-pay | Admitting: Plastic Surgery

## 2020-08-11 ENCOUNTER — Other Ambulatory Visit: Payer: Self-pay

## 2020-08-11 ENCOUNTER — Ambulatory Visit (INDEPENDENT_AMBULATORY_CARE_PROVIDER_SITE_OTHER): Payer: Medicare HMO | Admitting: Family Medicine

## 2020-08-11 ENCOUNTER — Encounter (INDEPENDENT_AMBULATORY_CARE_PROVIDER_SITE_OTHER): Payer: Self-pay | Admitting: Family Medicine

## 2020-08-11 VITALS — BP 132/80 | HR 74 | Temp 98.7°F | Ht 69.0 in | Wt 207.0 lb

## 2020-08-11 DIAGNOSIS — E669 Obesity, unspecified: Secondary | ICD-10-CM | POA: Diagnosis not present

## 2020-08-11 DIAGNOSIS — Z1331 Encounter for screening for depression: Secondary | ICD-10-CM | POA: Diagnosis not present

## 2020-08-11 DIAGNOSIS — R5383 Other fatigue: Secondary | ICD-10-CM | POA: Diagnosis not present

## 2020-08-11 DIAGNOSIS — E782 Mixed hyperlipidemia: Secondary | ICD-10-CM | POA: Diagnosis not present

## 2020-08-11 DIAGNOSIS — E538 Deficiency of other specified B group vitamins: Secondary | ICD-10-CM

## 2020-08-11 DIAGNOSIS — E1169 Type 2 diabetes mellitus with other specified complication: Secondary | ICD-10-CM | POA: Diagnosis not present

## 2020-08-11 DIAGNOSIS — R0602 Shortness of breath: Secondary | ICD-10-CM | POA: Diagnosis not present

## 2020-08-11 DIAGNOSIS — Z0289 Encounter for other administrative examinations: Secondary | ICD-10-CM

## 2020-08-11 DIAGNOSIS — Z6830 Body mass index (BMI) 30.0-30.9, adult: Secondary | ICD-10-CM

## 2020-08-11 DIAGNOSIS — E559 Vitamin D deficiency, unspecified: Secondary | ICD-10-CM

## 2020-08-11 DIAGNOSIS — E039 Hypothyroidism, unspecified: Secondary | ICD-10-CM | POA: Diagnosis not present

## 2020-08-11 NOTE — Patient Instructions (Signed)
Health Maintenance Due  Topic Date Due   Zoster Vaccines- Shingrix (1 of 2) Never done   INFLUENZA VACCINE  08/09/2020    Depression screen [facility] 2/9 08/11/2020 04/27/2020 08/21/2019  Decreased Interest 1 0 0  Down, Depressed, Hopeless 0 0 0  PHQ - 2 Score 1 0 0  Altered sleeping 0 - -  Tired, decreased energy 3 - -  Change in appetite 0 - -  Feeling bad or failure about yourself  0 - -  Trouble concentrating 0 - -  Moving slowly or fidgety/restless 1 - -  Suicidal thoughts 0 - -  PHQ-9 Score 5 - -  Difficult doing work/chores Somewhat difficult - -

## 2020-08-12 LAB — TSH: TSH: 1.94 u[IU]/mL (ref 0.450–4.500)

## 2020-08-12 LAB — CBC WITH DIFFERENTIAL
Basophils Absolute: 0 10*3/uL (ref 0.0–0.2)
Basos: 0 %
EOS (ABSOLUTE): 0 10*3/uL (ref 0.0–0.4)
Eos: 1 %
Hematocrit: 44.3 % (ref 37.5–51.0)
Hemoglobin: 14.6 g/dL (ref 13.0–17.7)
Immature Grans (Abs): 0 10*3/uL (ref 0.0–0.1)
Immature Granulocytes: 0 %
Lymphocytes Absolute: 1.7 10*3/uL (ref 0.7–3.1)
Lymphs: 38 %
MCH: 30.2 pg (ref 26.6–33.0)
MCHC: 33 g/dL (ref 31.5–35.7)
MCV: 92 fL (ref 79–97)
Monocytes Absolute: 0.2 10*3/uL (ref 0.1–0.9)
Monocytes: 5 %
Neutrophils Absolute: 2.5 10*3/uL (ref 1.4–7.0)
Neutrophils: 56 %
RBC: 4.83 x10E6/uL (ref 4.14–5.80)
RDW: 13.8 % (ref 11.6–15.4)
WBC: 4.5 10*3/uL (ref 3.4–10.8)

## 2020-08-12 LAB — COMPREHENSIVE METABOLIC PANEL
ALT: 12 IU/L (ref 0–44)
AST: 14 IU/L (ref 0–40)
Albumin/Globulin Ratio: 2 (ref 1.2–2.2)
Albumin: 4.7 g/dL (ref 3.8–4.8)
Alkaline Phosphatase: 68 IU/L (ref 44–121)
BUN/Creatinine Ratio: 11 (ref 10–24)
BUN: 12 mg/dL (ref 8–27)
Bilirubin Total: 0.3 mg/dL (ref 0.0–1.2)
CO2: 21 mmol/L (ref 20–29)
Calcium: 10 mg/dL (ref 8.6–10.2)
Chloride: 99 mmol/L (ref 96–106)
Creatinine, Ser: 1.08 mg/dL (ref 0.76–1.27)
Globulin, Total: 2.4 g/dL (ref 1.5–4.5)
Glucose: 94 mg/dL (ref 65–99)
Potassium: 4.2 mmol/L (ref 3.5–5.2)
Sodium: 135 mmol/L (ref 134–144)
Total Protein: 7.1 g/dL (ref 6.0–8.5)
eGFR: 76 mL/min/{1.73_m2} (ref >59)

## 2020-08-12 LAB — LIPID PANEL WITH LDL/HDL RATIO
Cholesterol, Total: 195 mg/dL (ref 100–199)
HDL: 67 mg/dL (ref >39)
LDL Chol Calc (NIH): 110 mg/dL — ABNORMAL HIGH (ref 0–99)
LDL/HDL Ratio: 1.6 ratio (ref 0.0–3.6)
Triglycerides: 99 mg/dL (ref 0–149)
VLDL Cholesterol Cal: 18 mg/dL (ref 5–40)

## 2020-08-12 LAB — VITAMIN D 25 HYDROXY (VIT D DEFICIENCY, FRACTURES): Vit D, 25-Hydroxy: 60.5 ng/mL (ref 30.0–100.0)

## 2020-08-12 LAB — INSULIN, RANDOM: INSULIN: 10.1 u[IU]/mL (ref 2.6–24.9)

## 2020-08-12 LAB — VITAMIN B12: Vitamin B-12: 287 pg/mL (ref 232–1245)

## 2020-08-12 LAB — T4, FREE: Free T4: 1.44 ng/dL (ref 0.82–1.77)

## 2020-08-12 LAB — T3: T3, Total: 88 ng/dL (ref 71–180)

## 2020-08-16 NOTE — Progress Notes (Signed)
Chief Complaint:   OBESITY TIWAN SCHNITKER (MR# [id number]) is a 65 y.o. male who presents for evaluation and treatment of obesity and related comorbidities. Current BMI is Body mass index is 30.57 kg/m. Herrick has been struggling with his weight for many years and has been unsuccessful in either losing weight, maintaining weight loss, or reaching his healthy weight goal.  Nevaan wants to lose 10-15 lbs so that he can decrease his visceral fat to help with improving the results of an upcoming lipectomy.   Izaiah is currently in the action stage of change and ready to dedicate time achieving and maintaining a healthier weight. Estuardo is interested in becoming our patient and working on intensive lifestyle modifications including (but not limited to) diet and exercise for weight loss.  Nasean's habits were reviewed today and are as follows: his desired weight loss is 12 lbs, he started gaining weight in 1995, his heaviest weight ever was 225 pounds, he has significant food cravings issues, he skips meals frequently, he is frequently drinking liquids with calories, and he frequently makes poor food choices.  Depression Screen Marqui's Food and Mood (modified PHQ-9) score was 5.  Depression screen PHQ 2/9 08/11/2020  Decreased Interest 1  Down, Depressed, Hopeless 0  PHQ - 2 Score 1  Altered sleeping 0  Tired, decreased energy 3  Change in appetite 0  Feeling bad or failure about yourself  0  Trouble concentrating 0  Moving slowly or fidgety/restless 1  Suicidal thoughts 0  PHQ-9 Score 5  Difficult doing work/chores Somewhat difficult   Subjective:   1. Other fatigue Macarius admits to daytime somnolence and admits to both waking up still tired and refreshed. Patent has a history of symptoms of daytime fatigue. Eoin generally gets 3 or 4 hours of sleep per night, and states that he has nightime awakenings. Snoring is present. Apneic episodes are present. Epworth  Sleepiness Score is 9.  2. SOB (shortness of breath) on exertion Casimiro Needle notes increasing shortness of breath with exercising and seems to be worsening over time with weight gain. He notes getting out of breath sooner with activity than he used to. This has not gotten worse recently. Cloyde denies shortness of breath at rest or orthopnea.  3. Type 2 diabetes mellitus with other specified complication, without long-term current use of insulin (HCC) Mattthew is on multiple medications, and his recent A1c was 6.2. He is working on diet and exercise, and he has already lost a few pounds.  4. Mixed hyperlipidemia Taron is on medications, and he is working on his diet. He is due for labs and he denies chest pain.  5. Vitamin D deficiency Keishaun is on Vit D prescription, and he has no recent labs in Epic.  6. B12 deficiency Calix is on B12, and he has no recent labs.  Assessment/Plan:   1. Other fatigue Tavious does feel that his weight is causing his energy to be lower than it should be. Fatigue may be related to obesity, depression or many other causes. Labs will be ordered, and in the meanwhile, Noell will focus on self care including making healthy food choices, increasing physical activity and focusing on stress reduction.  - TSH - T3 - CBC With Differential - EKG 12-Lead - T4, free  2. SOB (shortness of breath) on exertion Jakyri does feel that he gets out of breath more easily that he used to when he exercises. Juanya's shortness of breath appears to be obesity  related and exercise induced. He has agreed to work on weight loss and gradually increase exercise to treat his exercise induced shortness of breath. Will continue to monitor closely.  3. Type 2 diabetes mellitus with other specified complication, without long-term current use of insulin (HCC) We will check labs today. Jerrod is to start his Category 2 plan + 100 calories, and he will continue to follow up as  directed. Good blood sugar control is important to decrease the likelihood of diabetic complications such as nephropathy, neuropathy, limb loss, blindness, coronary artery disease, and death. Intensive lifestyle modification including diet, exercise and weight loss are the first line of treatment for diabetes.   - Insulin, random - Comprehensive metabolic panel  4. Mixed hyperlipidemia Cardiovascular risk and specific lipid/LDL goals reviewed. We discussed several lifestyle modifications today. We will check labs today. Voshon will start his eating plan, and will continue to work on diet, exercise and weight loss efforts. Orders and follow up as documented in patient record.   - Lipid Panel With LDL/HDL Ratio  5. Vitamin D deficiency Low Vitamin D level contributes to fatigue and are associated with obesity, breast, and colon cancer. We will check labs today. Akbar will follow-up for routine testing of Vitamin D, at least 2-3 times per year to avoid over-replacement.  - VITAMIN D 25 Hydroxy (Vit-D Deficiency, Fractures)  6. B12 deficiency The diagnosis was reviewed with the patient. We will check labs today. Onofre will continue to follow up as directed. Orders and follow up as documented in patient record.  - Vitamin B12  7. Screening for depression Leovardo had a positive depression screening. Depression is commonly associated with obesity and often results in emotional eating behaviors. We will monitor this closely and work on CBT to help improve the non-hunger eating patterns. Referral to Psychology may be required if no improvement is seen as he continues in our clinic.  8. Obesity with current BMI 30.6 Dexton is currently in the action stage of change and his goal is to continue with weight loss efforts. I recommend Tadeusz begin the structured treatment plan as follows:  He has agreed to the Category 2 Plan + 100 calories.  Exercise goals: No exercise has been prescribed for  now, while we concentrate on nutritional changes.  Behavioral modification strategies: increasing lean protein intake and no skipping meals.  He was informed of the importance of frequent follow-up visits to maximize his success with intensive lifestyle modifications for his multiple health conditions. He was informed we would discuss his lab results at his next visit unless there is a critical issue that needs to be addressed sooner. Rishith agreed to keep his next visit at the agreed upon time to discuss these results.  Objective:   Blood pressure 132/80, pulse 74, temperature 98.7 F (37.1 C), height 5\' 9"  (1.753 m), weight 207 lb (93.9 kg), SpO2 100 %. Body mass index is 30.57 kg/m.  EKG: Normal sinus rhythm, rate 63 BPM.  Indirect Calorimeter completed today shows a VO2 of 231 and a REE of 1609.  His calculated basal metabolic rate is thus his basal metabolic rate is worse than expected.  General: Cooperative, alert, well developed, in no acute distress. HEENT: Conjunctivae and lids unremarkable. Cardiovascular: Regular rhythm.  Lungs: Normal work of breathing. Neurologic: No focal deficits.   Lab Results  Component Value Date   CREATININE 1.08 08/11/2020   BUN 12 08/11/2020   NA 135 08/11/2020   K 4.2 08/11/2020  CL 99 08/11/2020   CO2 21 08/11/2020   Lab Results  Component Value Date   ALT 12 08/11/2020   AST 14 08/11/2020   ALKPHOS 68 08/11/2020   BILITOT 0.3 08/11/2020   Lab Results  Component Value Date   HGBA1C 6.2 (A) 07/23/2020   HGBA1C 6.2 (A) 03/19/2020   HGBA1C 6.3 03/02/2020   HGBA1C 6.7 (A) 11/14/2019   HGBA1C 7.0 (A) 07/11/2019   Lab Results  Component Value Date   INSULIN 10.1 08/11/2020   Lab Results  Component Value Date   TSH 1.940 08/11/2020   Lab Results  Component Value Date   CHOL 195 08/11/2020   HDL 67 08/11/2020   LDLCALC 110 (H) 08/11/2020   LDLDIRECT 86.0 02/10/2019   TRIG 99 08/11/2020   CHOLHDL 3 03/02/2020   Lab  Results  Component Value Date   WBC 4.5 08/11/2020   HGB 14.6 08/11/2020   HCT 44.3 08/11/2020   MCV 92 08/11/2020   PLT 285.0 02/10/2019   Lab Results  Component Value Date   IRON 60 02/02/2017   FERRITIN 170.0 08/17/2014   Attestation Statements:   Reviewed by clinician on day of visit: allergies, medications, problem list, medical history, surgical history, family history, social history, and previous encounter notes.  Time spent on visit including pre-visit chart review and post-visit charting and care was 75 minutes.    I, Burt Knack, am acting as transcriptionist for Quillian Quince, MD.  I have reviewed the above documentation for accuracy and completeness, and I agree with the above. - Quillian Quince, MD

## 2020-08-24 ENCOUNTER — Encounter (INDEPENDENT_AMBULATORY_CARE_PROVIDER_SITE_OTHER): Payer: Self-pay | Admitting: Family Medicine

## 2020-08-24 ENCOUNTER — Other Ambulatory Visit: Payer: Self-pay

## 2020-08-24 ENCOUNTER — Ambulatory Visit (INDEPENDENT_AMBULATORY_CARE_PROVIDER_SITE_OTHER): Payer: Medicare HMO | Admitting: Family Medicine

## 2020-08-24 VITALS — BP 121/63 | HR 70 | Temp 97.9°F | Ht 69.0 in | Wt 208.0 lb

## 2020-08-24 DIAGNOSIS — E781 Pure hyperglyceridemia: Secondary | ICD-10-CM | POA: Diagnosis not present

## 2020-08-24 DIAGNOSIS — E538 Deficiency of other specified B group vitamins: Secondary | ICD-10-CM

## 2020-08-24 DIAGNOSIS — Z6830 Body mass index (BMI) 30.0-30.9, adult: Secondary | ICD-10-CM

## 2020-08-24 DIAGNOSIS — E669 Obesity, unspecified: Secondary | ICD-10-CM | POA: Diagnosis not present

## 2020-08-24 DIAGNOSIS — E1169 Type 2 diabetes mellitus with other specified complication: Secondary | ICD-10-CM

## 2020-08-25 NOTE — Progress Notes (Signed)
Chief Complaint:   OBESITY Terry Harrington is here to discuss his progress with his obesity treatment plan along with follow-up of his obesity related diagnoses. Terry Harrington is on the Category 2 Plan + 100 calories and states he is following his eating plan approximately 90% of the time. Terry Harrington states he is doing stretches, crunches, and pullups for 20-30 minutes 3 times per week.  Today's visit was #: 2 Starting weight: 207 lbs Starting date: 08/11/2020 Today's weight: 208 lbs Today's date: 08/24/2020 Total lbs lost to date: 0 Total lbs lost since last in-office visit: 0  Interim History: Terry Harrington did well with following his plan but he struggled with hunger at times, especially in the PM. He used his snack calories on healthy food options, but mostly simple carbohydrates and this may have increased his PM hunger.  Subjective:   1. Pure hypertriglyceridemia Terry Harrington is on statin, and he is working on diet and exercise. His LDL is still above goal. I discussed labs with the patient today.  2. Vitamin B 12 deficiency Terry Harrington's B12 is lower than goal. He denies GI surgery or a history of anemia. I discussed labs with the patient today.  3. Type 2 diabetes mellitus with other specified complication, without long-term current use of insulin (HCC) Terry Harrington's A1c is well controlled on his current medications. He notes polyphagia however, even while on Trulicity. I discussed labs with the patient today.  Assessment/Plan:   1. Pure hypertriglyceridemia Cardiovascular risk and specific lipid/LDL goals reviewed. We discussed several lifestyle modifications today. We will recheck labs in 3 months. Terry Harrington will continue to work on diet, exercise and weight loss efforts. Orders and follow up as documented in patient record.   2. Vitamin B 12 deficiency The diagnosis was reviewed with the patient. Terry Harrington is now on a B12 rich diet, and he will continue with this. We will recheck labs in 3 months. Orders and  follow up as documented in patient record.  3. Type 2 diabetes mellitus with other specified complication, without long-term current use of insulin (HCC) Terry Harrington may benefit from a higher dose of Ozempic to help decrease polyphagia, but his will use up his current supply of Trulicity first and then we will reassess. He will also increase his lean protein in the meanwhile. Good blood sugar control is important to decrease the likelihood of diabetic complications such as nephropathy, neuropathy, limb loss, blindness, coronary artery disease, and death. Intensive lifestyle modification including diet, exercise and weight loss are the first line of treatment for diabetes.   4. Obesity with current BMI 30.8 Terry Harrington is currently in the action stage of change. As such, his goal is to continue with weight loss efforts. He has agreed to the Category 2 Plan + 100 calories. He is to eat more protein for his snacks. Protein options were discussed.   Exercise goals: As is.  Behavioral modification strategies: increasing lean protein intake and better snacking choices.  Terry Harrington has agreed to follow-up with our clinic in 2 weeks. He was informed of the importance of frequent follow-up visits to maximize his success with intensive lifestyle modifications for his multiple health conditions.   Objective:   Blood pressure 121/63, pulse 70, temperature 97.9 F (36.6 C), height 5\' 9"  (1.753 m), weight 208 lb (94.3 kg), SpO2 100 %. Body mass index is 30.72 kg/m.  General: Cooperative, alert, well developed, in no acute distress. HEENT: Conjunctivae and lids unremarkable. Cardiovascular: Regular rhythm.  Lungs: Normal work of breathing. Neurologic: No  focal deficits.   Lab Results  Component Value Date   CREATININE 1.08 08/11/2020   BUN 12 08/11/2020   NA 135 08/11/2020   K 4.2 08/11/2020   CL 99 08/11/2020   CO2 21 08/11/2020   Lab Results  Component Value Date   ALT 12 08/11/2020   AST 14  08/11/2020   ALKPHOS 68 08/11/2020   BILITOT 0.3 08/11/2020   Lab Results  Component Value Date   HGBA1C 6.2 (A) 07/23/2020   HGBA1C 6.2 (A) 03/19/2020   HGBA1C 6.3 03/02/2020   HGBA1C 6.7 (A) 11/14/2019   HGBA1C 7.0 (A) 07/11/2019   Lab Results  Component Value Date   INSULIN 10.1 08/11/2020   Lab Results  Component Value Date   TSH 1.940 08/11/2020   Lab Results  Component Value Date   CHOL 195 08/11/2020   HDL 67 08/11/2020   LDLCALC 110 (H) 08/11/2020   LDLDIRECT 86.0 02/10/2019   TRIG 99 08/11/2020   CHOLHDL 3 03/02/2020   Lab Results  Component Value Date   VD25OH 60.5 08/11/2020   VD25OH 40.92 03/02/2020   VD25OH 17 (L) 08/21/2019   Lab Results  Component Value Date   WBC 4.5 08/11/2020   HGB 14.6 08/11/2020   HCT 44.3 08/11/2020   MCV 92 08/11/2020   PLT 285.0 02/10/2019   Lab Results  Component Value Date   IRON 60 02/02/2017   FERRITIN 170.0 08/17/2014   Attestation Statements:   Reviewed by clinician on day of visit: allergies, medications, problem list, medical history, surgical history, family history, social history, and previous encounter notes.  Time spent on visit including pre-visit chart review and post-visit care and charting was 42 minutes.    I, Burt Knack, am acting as transcriptionist for Quillian Quince, MD.  I have reviewed the above documentation for accuracy and completeness, and I agree with the above. -  Quillian Quince, MD

## 2020-08-30 ENCOUNTER — Encounter: Payer: Self-pay | Admitting: Internal Medicine

## 2020-08-30 ENCOUNTER — Ambulatory Visit (INDEPENDENT_AMBULATORY_CARE_PROVIDER_SITE_OTHER): Payer: Medicare HMO | Admitting: Internal Medicine

## 2020-08-30 ENCOUNTER — Other Ambulatory Visit: Payer: Self-pay

## 2020-08-30 VITALS — BP 122/66 | HR 66 | Temp 98.0°F | Ht 69.0 in | Wt 209.0 lb

## 2020-08-30 DIAGNOSIS — E039 Hypothyroidism, unspecified: Secondary | ICD-10-CM | POA: Diagnosis not present

## 2020-08-30 DIAGNOSIS — E559 Vitamin D deficiency, unspecified: Secondary | ICD-10-CM

## 2020-08-30 DIAGNOSIS — I10 Essential (primary) hypertension: Secondary | ICD-10-CM | POA: Diagnosis not present

## 2020-08-30 MED ORDER — VITAMIN D3 1.25 MG (50000 UT) PO CAPS: ORAL_CAPSULE | ORAL | 0 refills | Status: DC

## 2020-08-30 NOTE — Patient Instructions (Signed)

## 2020-08-30 NOTE — Progress Notes (Signed)
Subjective:  Patient ID: Terry Harrington, male    DOB: 1955-03-26  Age: 65 y.o. MRN: [id number]  CC: Follow-up (6 month f/u)  This visit occurred during the SARS-CoV-2 public health emergency.  Safety protocols were in place, including screening questions prior to the visit, additional usage of staff PPE, and extensive cleaning of exam room while observing appropriate contact time as indicated for disinfecting solutions.    HPI Terry Harrington presents for f/up -  He has a hx of hypercalcemia. His recent Ca++ was normal. He feels well.  Outpatient Medications Prior to Visit  Medication Sig Dispense Refill   amlodipine-olmesartan (AZOR) 10-20 MG tablet Take 1 tablet by mouth daily. 90 tablet 1   Dulaglutide (TRULICITY) 4.5 MG/0.5ML SOPN Inject 0.5 mLs (4.5 mg total) as directed once a week. 12 pen 3   fenofibrate (TRICOR) 48 MG tablet TAKE 1 TABLET BY MOUTH EVERY DAY 90 tablet 1   JARDIANCE 25 MG TABS tablet TAKE 1 TABLET BY MOUTH EVERY DAY 90 tablet 1   levothyroxine (SYNTHROID) 100 MCG tablet TAKE 1 TABLET BY MOUTH EVERY DAY 90 tablet 0   loratadine (CLARITIN) 10 MG tablet Take 10 mg by mouth daily.     meclizine (ANTIVERT) 25 MG tablet Take 1 tablet (25 mg total) by mouth 3 (three) times daily as needed for dizziness. 90 tablet 3   metFORMIN (GLUCOPHAGE) 500 MG tablet TAKE 1 TABLET BY MOUTH 2 TIMES DAILY WITH A MEAL. 180 tablet 1   Multiple Vitamins-Minerals (PRESERVISION AREDS 2) CAPS Take by mouth.     pioglitazone (ACTOS) 45 MG tablet TAKE 1 TABLET BY MOUTH EVERY DAY 90 tablet 3   pravastatin (PRAVACHOL) 40 MG tablet TAKE 1 TABLET BY MOUTH EVERY DAY 90 tablet 1   Testosterone 12.5 MG/ACT (1%) GEL Place 1 Pump onto the skin daily. 75 g 1   vitamin B-12 (CYANOCOBALAMIN) 1000 MCG tablet Take 2,000 mcg by mouth daily.     Cholecalciferol (VITAMIN D3) 1.25 MG (50000 UT) CAPS TAKE 1 CAPSULE BY MOUTH ONE TIME PER WEEK 12 capsule 0   No facility-administered medications prior to  visit.    ROS Review of Systems  Constitutional:  Negative for diaphoresis and fatigue.  HENT: Negative.    Eyes: Negative.   Respiratory:  Negative for cough, chest tightness, shortness of breath and wheezing.   Cardiovascular:  Negative for chest pain, palpitations and leg swelling.  Gastrointestinal:  Negative for abdominal pain, constipation and diarrhea.  Endocrine: Negative.   Genitourinary: Negative.  Negative for difficulty urinating.  Musculoskeletal: Negative.   Skin: Negative.   Neurological: Negative.   Hematological:  Negative for adenopathy. Does not bruise/bleed easily.  Psychiatric/Behavioral: Negative.     Objective:  BP 122/66 (BP Location: Right Arm, Patient Position: Sitting, Cuff Size: Large)   Pulse 66   Temp 98 F (36.7 C) (Oral)   Ht 5\' 9"  (1.753 m)   Wt 209 lb (94.8 kg)   SpO2 97%   BMI 30.86 kg/m   BP Readings from Last 3 Encounters:  08/30/20 122/66  08/24/20 121/63  08/11/20 132/80    Wt Readings from Last 3 Encounters:  08/30/20 209 lb (94.8 kg)  08/24/20 208 lb (94.3 kg)  08/11/20 207 lb (93.9 kg)    Physical Exam Vitals reviewed.  HENT:     Nose: Nose normal.  Eyes:     Conjunctiva/sclera: Conjunctivae normal.  Cardiovascular:     Rate and Rhythm: Normal rate and regular rhythm.  Heart sounds: No murmur heard. Pulmonary:     Effort: Pulmonary effort is normal.     Breath sounds: No stridor. No wheezing, rhonchi or rales.  Abdominal:     General: Abdomen is flat.     Palpations: There is no mass.     Tenderness: There is no abdominal tenderness.  Musculoskeletal:        General: Normal range of motion.     Cervical back: Neck supple.     Right lower leg: No edema.     Left lower leg: No edema.  Skin:    General: Skin is warm and dry.  Neurological:     General: No focal deficit present.     Mental Status: He is alert.    Lab Results  Component Value Date   WBC 4.5 08/11/2020   HGB 14.6 08/11/2020   HCT 44.3  08/11/2020   PLT 285.0 02/10/2019   GLUCOSE 94 08/11/2020   CHOL 195 08/11/2020   TRIG 99 08/11/2020   HDL 67 08/11/2020   LDLDIRECT 86.0 02/10/2019   LDLCALC 110 (H) 08/11/2020   ALT 12 08/11/2020   AST 14 08/11/2020   NA 135 08/11/2020   K 4.2 08/11/2020   CL 99 08/11/2020   CREATININE 1.08 08/11/2020   BUN 12 08/11/2020   CO2 21 08/11/2020   TSH 1.940 08/11/2020   PSA 0.41 03/02/2020   HGBA1C 6.2 (A) 07/23/2020   MICROALBUR <0.7 03/02/2020    DG HIP UNILAT WITH PELVIS 2-3 VIEWS LEFT  Result Date: 05/13/2019 CLINICAL DATA:  Pain EXAM: DG HIP (WITH OR WITHOUT PELVIS) 2-3V LEFT COMPARISON:  None. FINDINGS: Frontal pelvis as well as weightbearing frontal and supine lateral left hip images obtained. There is no appreciable fracture or dislocation. There is mild symmetric narrowing of each hip joint. No erosive change. Sacroiliac joints appear unremarkable bilaterally. There is lower lumbar levoscoliosis. IMPRESSION: Symmetric narrowing hip joints. No fracture or dislocation. Lower lumbar levoscoliosis. Electronically Signed   By: Bretta Bang III M.D.   On: 05/13/2019 08:32    Assessment & Plan:   Terry Harrington was seen today for follow-up.  Diagnoses and all orders for this visit:  Primary hypertension- His BP is well controlled.  Vitamin D deficiency disease -     Cholecalciferol (VITAMIN D3) 1.25 MG (50000 UT) CAPS; TAKE 1 CAPSULE BY MOUTH ONE TIME PER WEEK  Hypothyroidism (acquired)- He is euthyroid.  I am having Terry Harrington maintain his vitamin B-12, loratadine, Trulicity, PreserVision AREDS 2, pravastatin, pioglitazone, amlodipine-olmesartan, Jardiance, meclizine, fenofibrate, levothyroxine, metFORMIN, Testosterone, and Vitamin D3.  Meds ordered this encounter  Medications   Cholecalciferol (VITAMIN D3) 1.25 MG (50000 UT) CAPS    Sig: TAKE 1 CAPSULE BY MOUTH ONE TIME PER WEEK    Dispense:  12 capsule    Refill:  0     Follow-up: Return in about 6 months  (around 03/02/2021).  Terry Linger, MD

## 2020-09-02 ENCOUNTER — Other Ambulatory Visit: Payer: Self-pay | Admitting: Internal Medicine

## 2020-09-02 DIAGNOSIS — I10 Essential (primary) hypertension: Secondary | ICD-10-CM

## 2020-09-02 DIAGNOSIS — E785 Hyperlipidemia, unspecified: Secondary | ICD-10-CM

## 2020-09-02 DIAGNOSIS — E118 Type 2 diabetes mellitus with unspecified complications: Secondary | ICD-10-CM

## 2020-09-02 DIAGNOSIS — N1831 Chronic kidney disease, stage 3a: Secondary | ICD-10-CM

## 2020-09-03 ENCOUNTER — Other Ambulatory Visit: Payer: Self-pay | Admitting: Internal Medicine

## 2020-09-03 ENCOUNTER — Other Ambulatory Visit: Payer: Self-pay | Admitting: Endocrinology

## 2020-09-03 DIAGNOSIS — E039 Hypothyroidism, unspecified: Secondary | ICD-10-CM

## 2020-09-06 ENCOUNTER — Telehealth: Payer: Self-pay | Admitting: Endocrinology

## 2020-09-06 DIAGNOSIS — E118 Type 2 diabetes mellitus with unspecified complications: Secondary | ICD-10-CM

## 2020-09-06 NOTE — Telephone Encounter (Signed)
[employer] [facility] called to request a 100 day refill instead of 90 day supply for Metformin & Pioglitazone. Patient uses [facility] on [address]. This 100 day refill instead of 90 day is a "benefit" of this companies RX plan - 100 day for 90 day price

## 2020-09-07 ENCOUNTER — Telehealth: Payer: Self-pay

## 2020-09-07 ENCOUNTER — Other Ambulatory Visit: Payer: Self-pay | Admitting: Internal Medicine

## 2020-09-07 ENCOUNTER — Other Ambulatory Visit: Payer: Self-pay

## 2020-09-07 DIAGNOSIS — I10 Essential (primary) hypertension: Secondary | ICD-10-CM

## 2020-09-07 DIAGNOSIS — E118 Type 2 diabetes mellitus with unspecified complications: Secondary | ICD-10-CM

## 2020-09-07 DIAGNOSIS — N1831 Chronic kidney disease, stage 3a: Secondary | ICD-10-CM

## 2020-09-07 MED ORDER — PIOGLITAZONE HCL 45 MG PO TABS: 45.0000 mg | ORAL_TABLET | Freq: Every day | ORAL | 3 refills | Status: DC

## 2020-09-07 MED ORDER — METFORMIN HCL 500 MG PO TABS
500.0000 mg | ORAL_TABLET | Freq: Two times a day (BID) | ORAL | 3 refills | Status: DC
Start: 2020-09-07 — End: 2020-10-26

## 2020-09-07 NOTE — Telephone Encounter (Signed)
Please advise as Selena Batten with [facility] has called to ask that the pt Medications be sent in at 100 day supply due to new program for Medicare pts.  Medications that amount change request for are levothyroxine (SYNTHROID) 100 MCG , pravastatin (PRAVACHOL) 40 MG tablet , amlodipine-olmesartan (AZOR) 10-20 MG tablet , Cholecalciferol (VITAMIN D3) 1.25 MG (50000 UT) CAPS , fenofibrate (TRICOR) 48 MG tablet, meclizine (ANTIVERT) 25 MG tablet,   Telephone number for Selena Batten is [phone] in case anyone has any questions or concerns.

## 2020-09-09 ENCOUNTER — Other Ambulatory Visit: Payer: Self-pay | Admitting: Internal Medicine

## 2020-09-15 DIAGNOSIS — H524 Presbyopia: Secondary | ICD-10-CM | POA: Diagnosis not present

## 2020-09-15 LAB — HM DIABETES EYE EXAM

## 2020-09-16 ENCOUNTER — Other Ambulatory Visit: Payer: Self-pay

## 2020-09-16 ENCOUNTER — Encounter (INDEPENDENT_AMBULATORY_CARE_PROVIDER_SITE_OTHER): Payer: Self-pay | Admitting: Family Medicine

## 2020-09-16 ENCOUNTER — Ambulatory Visit (INDEPENDENT_AMBULATORY_CARE_PROVIDER_SITE_OTHER): Payer: Medicare HMO | Admitting: Family Medicine

## 2020-09-16 VITALS — BP 97/62 | HR 77 | Temp 98.0°F | Ht 69.0 in | Wt 205.0 lb

## 2020-09-16 DIAGNOSIS — E669 Obesity, unspecified: Secondary | ICD-10-CM

## 2020-09-16 DIAGNOSIS — Z6830 Body mass index (BMI) 30.0-30.9, adult: Secondary | ICD-10-CM | POA: Diagnosis not present

## 2020-09-16 DIAGNOSIS — E1169 Type 2 diabetes mellitus with other specified complication: Secondary | ICD-10-CM | POA: Diagnosis not present

## 2020-09-16 NOTE — Progress Notes (Signed)
Chief Complaint:   OBESITY Terry Harrington is here to discuss his progress with his obesity treatment plan along with follow-up of his obesity related diagnoses. Terry Harrington is on the Category 2 Plan + 100 calories and states he is following his eating plan approximately 90-95% of the time. Terry Harrington states he is doing squats, pullups, and walking for 30-45 minutes 3 times per week.  Today's visit was #: 3 Starting weight: 207 lbs Starting date: 08/11/2020 Today's weight: 205 lbs Today's date: 09/16/2020 Total lbs lost to date: 2 Total lbs lost since last in-office visit: 3  Interim History: Terry Harrington continues to work on diet and weight loss. His hunger is mostly controlled. He is happy on his plan and with his results. He will be traveling soon and he would like to discuss travel strategies.  Subjective:   1. Type 2 diabetes mellitus with other specified complication, without long-term current use of insulin (HCC) Terry Harrington is stable on his medications. He had 1 episode of symptomatic hypoglycemia with BG of 70. This happened when he was late with breakfast. He is doing well otherwise.  Assessment/Plan:   1. Type 2 diabetes mellitus with other specified complication, without long-term current use of insulin (HCC) Terry Harrington will continue with diet, exercise, and his medications, and will continue to follow up as directed. Good blood sugar control is important to decrease the likelihood of diabetic complications such as nephropathy, neuropathy, limb loss, blindness, coronary artery disease, and death. Intensive lifestyle modification including diet, exercise and weight loss are the first line of treatment for diabetes.   2. Obesity with current BMI 30.3 Terry Harrington is currently in the action stage of change. As such, his goal is to continue with weight loss efforts. He has agreed to the Category 2 Plan.   Exercise goals: As is.  Behavioral modification strategies: increasing lean protein intake and travel  eating strategies.  Terry Harrington has agreed to follow-up with our clinic in 2 weeks. He was informed of the importance of frequent follow-up visits to maximize his success with intensive lifestyle modifications for his multiple health conditions.   Objective:   Blood pressure 97/62, pulse 77, temperature 98 F (36.7 C), height 5\' 9"  (1.753 m), weight 205 lb (93 kg), SpO2 97 %. Body mass index is 30.27 kg/m.  General: Cooperative, alert, well developed, in no acute distress. HEENT: Conjunctivae and lids unremarkable. Cardiovascular: Regular rhythm.  Lungs: Normal work of breathing. Neurologic: No focal deficits.   Lab Results  Component Value Date   CREATININE 1.08 08/11/2020   BUN 12 08/11/2020   NA 135 08/11/2020   K 4.2 08/11/2020   CL 99 08/11/2020   CO2 21 08/11/2020   Lab Results  Component Value Date   ALT 12 08/11/2020   AST 14 08/11/2020   ALKPHOS 68 08/11/2020   BILITOT 0.3 08/11/2020   Lab Results  Component Value Date   HGBA1C 6.2 (A) 07/23/2020   HGBA1C 6.2 (A) 03/19/2020   HGBA1C 6.3 03/02/2020   HGBA1C 6.7 (A) 11/14/2019   HGBA1C 7.0 (A) 07/11/2019   Lab Results  Component Value Date   INSULIN 10.1 08/11/2020   Lab Results  Component Value Date   TSH 1.940 08/11/2020   Lab Results  Component Value Date   CHOL 195 08/11/2020   HDL 67 08/11/2020   LDLCALC 110 (H) 08/11/2020   LDLDIRECT 86.0 02/10/2019   TRIG 99 08/11/2020   CHOLHDL 3 03/02/2020   Lab Results  Component Value Date  VD25OH 60.5 08/11/2020   VD25OH 40.92 03/02/2020   VD25OH 17 (L) 08/21/2019   Lab Results  Component Value Date   WBC 4.5 08/11/2020   HGB 14.6 08/11/2020   HCT 44.3 08/11/2020   MCV 92 08/11/2020   PLT 285.0 02/10/2019   Lab Results  Component Value Date   IRON 60 02/02/2017   FERRITIN 170.0 08/17/2014   Attestation Statements:   Reviewed by clinician on day of visit: allergies, medications, problem list, medical history, surgical history, family  history, social history, and previous encounter notes.  Time spent on visit including pre-visit chart review and post-visit care and charting was 33 minutes.    I, Burt Knack, am acting as transcriptionist for Quillian Quince, MD.  I have reviewed the above documentation for accuracy and completeness, and I agree with the above. -  Quillian Quince, MD

## 2020-09-24 ENCOUNTER — Other Ambulatory Visit: Payer: Self-pay | Admitting: Internal Medicine

## 2020-09-24 DIAGNOSIS — I10 Essential (primary) hypertension: Secondary | ICD-10-CM

## 2020-09-24 DIAGNOSIS — E118 Type 2 diabetes mellitus with unspecified complications: Secondary | ICD-10-CM

## 2020-09-24 DIAGNOSIS — N1831 Chronic kidney disease, stage 3a: Secondary | ICD-10-CM

## 2020-09-24 DIAGNOSIS — E039 Hypothyroidism, unspecified: Secondary | ICD-10-CM

## 2020-09-24 DIAGNOSIS — R42 Dizziness and giddiness: Secondary | ICD-10-CM

## 2020-09-24 DIAGNOSIS — E781 Pure hyperglyceridemia: Secondary | ICD-10-CM

## 2020-09-24 DIAGNOSIS — E559 Vitamin D deficiency, unspecified: Secondary | ICD-10-CM

## 2020-09-24 MED ORDER — AMLODIPINE-OLMESARTAN 10-20 MG PO TABS: 1.0000 | ORAL_TABLET | Freq: Every day | ORAL | 1 refills | Status: DC

## 2020-09-24 MED ORDER — LEVOTHYROXINE SODIUM 100 MCG PO TABS: 100.0000 ug | ORAL_TABLET | Freq: Every day | ORAL | 0 refills | Status: DC

## 2020-09-24 MED ORDER — FENOFIBRATE 48 MG PO TABS: 48.0000 mg | ORAL_TABLET | Freq: Every day | ORAL | 1 refills | Status: DC

## 2020-09-24 MED ORDER — VITAMIN D3 1.25 MG (50000 UT) PO CAPS: ORAL_CAPSULE | ORAL | 0 refills | Status: DC

## 2020-09-24 MED ORDER — MECLIZINE HCL 25 MG PO TABS: 25.0000 mg | ORAL_TABLET | Freq: Three times a day (TID) | ORAL | 1 refills | Status: AC | PRN

## 2020-10-04 ENCOUNTER — Ambulatory Visit (INDEPENDENT_AMBULATORY_CARE_PROVIDER_SITE_OTHER): Payer: Medicare HMO | Admitting: Family Medicine

## 2020-10-04 ENCOUNTER — Encounter (INDEPENDENT_AMBULATORY_CARE_PROVIDER_SITE_OTHER): Payer: Self-pay | Admitting: Family Medicine

## 2020-10-04 ENCOUNTER — Other Ambulatory Visit: Payer: Self-pay

## 2020-10-04 VITALS — BP 101/51 | HR 69 | Temp 98.2°F | Ht 69.0 in | Wt 205.0 lb

## 2020-10-04 DIAGNOSIS — Z6830 Body mass index (BMI) 30.0-30.9, adult: Secondary | ICD-10-CM

## 2020-10-04 DIAGNOSIS — E1169 Type 2 diabetes mellitus with other specified complication: Secondary | ICD-10-CM

## 2020-10-04 DIAGNOSIS — E669 Obesity, unspecified: Secondary | ICD-10-CM | POA: Diagnosis not present

## 2020-10-05 NOTE — Progress Notes (Signed)
Chief Complaint:   OBESITY Terry Harrington is here to discuss his progress with his obesity treatment plan along with follow-up of his obesity related diagnoses. Terry Harrington is on the Category 2 Plan and states he is following his eating plan approximately 50% of the time. Terry Harrington states he is doing yard work for 6-8 hours 7 times per week.   Today's visit was #: 4 Starting weight: 207 lbs Starting date: 08/11/2020 Today's weight: 205 lbs Today's date: 10/04/2020 Total lbs lost to date: 2 Total lbs lost since last in-office visit: 0  Interim History: Terry Harrington continues to work on diet even with extra challenges since his last visit including traveling and eating out. He will have more traveling in the next 2 weeks. He did well being mindful and trying to increase his vegetables and lean protein.  Subjective:   1. Type 2 diabetes mellitus with other specified complication, without long-term current use of insulin (HCC) Terry Harrington's fasting blood sugars range between 90-128. He is stable on his medications and working on diet. He denies signs of hypoglycemia.  Assessment/Plan:   1. Type 2 diabetes mellitus with other specified complication, without long-term current use of insulin (HCC) Terry Harrington will continue his medications, and we may look at another GLP-1 at his next visit when he runs out. Good blood sugar control is important to decrease the likelihood of diabetic complications such as nephropathy, neuropathy, limb loss, blindness, coronary artery disease, and death. Intensive lifestyle modification including diet, exercise and weight loss are the first line of treatment for diabetes.   2. Obesity with current BMI 30.3 Terry Harrington is currently in the action stage of change. As such, his goal is to continue with weight loss efforts. He has agreed to the Category 2 Plan + 100 calories.   Exercise goals: As is.  Behavioral modification strategies: increasing lean protein intake and travel eating  strategies.  Terry Harrington has agreed to follow-up with our clinic in 2 weeks. He was informed of the importance of frequent follow-up visits to maximize his success with intensive lifestyle modifications for his multiple health conditions.   Objective:   Blood pressure (!) 101/51, pulse 69, temperature 98.2 F (36.8 C), height 5\' 9"  (1.753 m), weight 205 lb (93 kg), SpO2 99 %. Body mass index is 30.27 kg/m.  General: Cooperative, alert, well developed, in no acute distress. HEENT: Conjunctivae and lids unremarkable. Cardiovascular: Regular rhythm.  Lungs: Normal work of breathing. Neurologic: No focal deficits.   Lab Results  Component Value Date   CREATININE 1.08 08/11/2020   BUN 12 08/11/2020   NA 135 08/11/2020   K 4.2 08/11/2020   CL 99 08/11/2020   CO2 21 08/11/2020   Lab Results  Component Value Date   ALT 12 08/11/2020   AST 14 08/11/2020   ALKPHOS 68 08/11/2020   BILITOT 0.3 08/11/2020   Lab Results  Component Value Date   HGBA1C 6.2 (A) 07/23/2020   HGBA1C 6.2 (A) 03/19/2020   HGBA1C 6.3 03/02/2020   HGBA1C 6.7 (A) 11/14/2019   HGBA1C 7.0 (A) 07/11/2019   Lab Results  Component Value Date   INSULIN 10.1 08/11/2020   Lab Results  Component Value Date   TSH 1.940 08/11/2020   Lab Results  Component Value Date   CHOL 195 08/11/2020   HDL 67 08/11/2020   LDLCALC 110 (H) 08/11/2020   LDLDIRECT 86.0 02/10/2019   TRIG 99 08/11/2020   CHOLHDL 3 03/02/2020   Lab Results  Component Value Date  VD25OH 60.5 08/11/2020   VD25OH 40.92 03/02/2020   VD25OH 17 (L) 08/21/2019   Lab Results  Component Value Date   WBC 4.5 08/11/2020   HGB 14.6 08/11/2020   HCT 44.3 08/11/2020   MCV 92 08/11/2020   PLT 285.0 02/10/2019   Lab Results  Component Value Date   IRON 60 02/02/2017   FERRITIN 170.0 08/17/2014   Attestation Statements:   Reviewed by clinician on day of visit: allergies, medications, problem list, medical history, surgical history, family  history, social history, and previous encounter notes.  Time spent on visit including pre-visit chart review and post-visit care and charting was 30 minutes.    I, Burt Knack, am acting as transcriptionist for Quillian Quince, MD.  I have reviewed the above documentation for accuracy and completeness, and I agree with the above. -  Quillian Quince, MD

## 2020-10-16 ENCOUNTER — Other Ambulatory Visit: Payer: Self-pay | Admitting: Internal Medicine

## 2020-10-16 DIAGNOSIS — E118 Type 2 diabetes mellitus with unspecified complications: Secondary | ICD-10-CM

## 2020-10-18 ENCOUNTER — Encounter (INDEPENDENT_AMBULATORY_CARE_PROVIDER_SITE_OTHER): Payer: Self-pay | Admitting: Family Medicine

## 2020-10-18 ENCOUNTER — Encounter: Payer: Self-pay | Admitting: Endocrinology

## 2020-10-18 ENCOUNTER — Ambulatory Visit (INDEPENDENT_AMBULATORY_CARE_PROVIDER_SITE_OTHER): Payer: Medicare HMO | Admitting: Family Medicine

## 2020-10-18 ENCOUNTER — Other Ambulatory Visit: Payer: Self-pay

## 2020-10-18 VITALS — BP 104/66 | HR 64 | Temp 97.5°F | Ht 69.0 in | Wt 204.0 lb

## 2020-10-18 DIAGNOSIS — E1169 Type 2 diabetes mellitus with other specified complication: Secondary | ICD-10-CM

## 2020-10-18 DIAGNOSIS — E669 Obesity, unspecified: Secondary | ICD-10-CM | POA: Diagnosis not present

## 2020-10-18 DIAGNOSIS — Z6830 Body mass index (BMI) 30.0-30.9, adult: Secondary | ICD-10-CM | POA: Diagnosis not present

## 2020-10-18 MED ORDER — SEMAGLUTIDE (1 MG/DOSE) 4 MG/3ML ~~LOC~~ SOPN: 1.0000 mg | PEN_INJECTOR | SUBCUTANEOUS | 0 refills | Status: DC

## 2020-10-18 NOTE — Telephone Encounter (Signed)
Last OV with Dr. Beasley 

## 2020-10-19 NOTE — Progress Notes (Signed)
Chief Complaint:   OBESITY Terry Harrington is here to discuss his progress with his obesity treatment plan along with follow-up of his obesity related diagnoses. Terry Harrington is on the Category 2 Plan + 100 calories and states he is following his eating plan approximately 90% of the time. Terry Harrington states he is active while doing yard work for 8 hours 5 times per week.   Today's visit was #: 5 Starting weight: 207 lbs Starting date: 08/11/2020 Today's weight: 204 lbs Today's date: 10/18/2020 Total lbs lost to date: 3 Total lbs lost since last in-office visit: 1  Interim History: Terry Harrington continues to work on diet and exercise. He is following his plan when he can, but he has done a lot of traveling and he tried his beat to portion control and make smarter choices.  Subjective:   1. Type 2 diabetes mellitus with other specified complication, without long-term current use of insulin (HCC) Terry Harrington is stable on his medications including Trulicity. He still has polyphagia and he is open to looking at other GLP-1s. His fasting BGs range between 80-120, and post prandial mostly range between 120-150's.  Assessment/Plan:   1. Type 2 diabetes mellitus with other specified complication, without long-term current use of insulin (HCC) Terry Harrington agreed to discontinue Trulicity, and he will start Ozempic 1 mg with no refills. He will continue his other medications as is for now. He is to discuss and ok this change with Dr. Everardo All before starting. Good blood sugar control is important to decrease the likelihood of diabetic complications such as nephropathy, neuropathy, limb loss, blindness, coronary artery disease, and death. Intensive lifestyle modification including diet, exercise and weight loss are the first line of treatment for diabetes.   - Semaglutide, 1 MG/DOSE, 4 MG/3ML SOPN; Inject 1 mg as directed once a week.  Dispense: 3 mL; Refill: 0  2. Obesity with current BMI 30.2 Terry Harrington is currently in the  action stage of change. As such, his goal is to continue with weight loss efforts. He has agreed to the Category 2 Plan + 100 calories.   Exercise goals: As is.  Behavioral modification strategies: increasing lean protein intake and meal planning and cooking strategies.  Terry Harrington has agreed to follow-up with our clinic in 2 to 3 weeks. He was informed of the importance of frequent follow-up visits to maximize his success with intensive lifestyle modifications for his multiple health conditions.   Objective:   Blood pressure 104/66, pulse 64, temperature (!) 97.5 F (36.4 C), height 5\' 9"  (1.753 m), weight 204 lb (92.5 kg), SpO2 99 %. Body mass index is 30.13 kg/m.  General: Cooperative, alert, well developed, in no acute distress. HEENT: Conjunctivae and lids unremarkable. Cardiovascular: Regular rhythm.  Lungs: Normal work of breathing. Neurologic: No focal deficits.   Lab Results  Component Value Date   CREATININE 1.08 08/11/2020   BUN 12 08/11/2020   NA 135 08/11/2020   K 4.2 08/11/2020   CL 99 08/11/2020   CO2 21 08/11/2020   Lab Results  Component Value Date   ALT 12 08/11/2020   AST 14 08/11/2020   ALKPHOS 68 08/11/2020   BILITOT 0.3 08/11/2020   Lab Results  Component Value Date   HGBA1C 6.2 (A) 07/23/2020   HGBA1C 6.2 (A) 03/19/2020   HGBA1C 6.3 03/02/2020   HGBA1C 6.7 (A) 11/14/2019   HGBA1C 7.0 (A) 07/11/2019   Lab Results  Component Value Date   INSULIN 10.1 08/11/2020   Lab Results  Component Value  Date   TSH 1.940 08/11/2020   Lab Results  Component Value Date   CHOL 195 08/11/2020   HDL 67 08/11/2020   LDLCALC 110 (H) 08/11/2020   LDLDIRECT 86.0 02/10/2019   TRIG 99 08/11/2020   CHOLHDL 3 03/02/2020   Lab Results  Component Value Date   VD25OH 60.5 08/11/2020   VD25OH 40.92 03/02/2020   VD25OH 17 (L) 08/21/2019   Lab Results  Component Value Date   WBC 4.5 08/11/2020   HGB 14.6 08/11/2020   HCT 44.3 08/11/2020   MCV 92 08/11/2020    PLT 285.0 02/10/2019   Lab Results  Component Value Date   IRON 60 02/02/2017   FERRITIN 170.0 08/17/2014    Obesity Behavioral Intervention:   Approximately 15 minutes were spent on the discussion below.  ASK: We discussed the diagnosis of obesity with Terry Harrington today and Terry Harrington agreed to give [place] permission to discuss obesity behavioral modification therapy today.  ASSESS: Terry Harrington has the diagnosis of obesity and his BMI today is 30.2. Terry Harrington is in the action stage of change.   ADVISE: Terry Harrington was educated on the multiple health risks of obesity as well as the benefit of weight loss to improve his health. He was advised of the need for long term treatment and the importance of lifestyle modifications to improve his current health and to decrease his risk of future health problems.  AGREE: Multiple dietary modification options and treatment options were discussed and Terry Harrington agreed to follow the recommendations documented in the above note.  ARRANGE: Terry Harrington was educated on the importance of frequent visits to treat obesity as outlined per CMS and USPSTF guidelines and agreed to schedule his next follow up appointment today.  Attestation Statements:   Reviewed by clinician on day of visit: allergies, medications, problem list, medical history, surgical history, family history, social history, and previous encounter notes.   I, Burt Knack, am acting as transcriptionist for Quillian Quince, MD.  I have reviewed the above documentation for accuracy and completeness, and I agree with the above. -   Quillian Quince, MD

## 2020-10-26 ENCOUNTER — Ambulatory Visit (INDEPENDENT_AMBULATORY_CARE_PROVIDER_SITE_OTHER): Payer: Medicare HMO | Admitting: Endocrinology

## 2020-10-26 ENCOUNTER — Other Ambulatory Visit: Payer: Self-pay

## 2020-10-26 VITALS — BP 130/66 | HR 79 | Ht 69.0 in | Wt 205.0 lb

## 2020-10-26 DIAGNOSIS — E118 Type 2 diabetes mellitus with unspecified complications: Secondary | ICD-10-CM

## 2020-10-26 DIAGNOSIS — E291 Testicular hypofunction: Secondary | ICD-10-CM

## 2020-10-26 LAB — POCT GLYCOSYLATED HEMOGLOBIN (HGB A1C): Hemoglobin A1C: 5.8 % — AB (ref 4.0–5.6)

## 2020-10-26 MED ORDER — METFORMIN HCL ER 500 MG PO TB24: 1000.0000 mg | ORAL_TABLET | Freq: Every day | ORAL | 3 refills | Status: DC

## 2020-10-26 NOTE — Patient Instructions (Addendum)
I have sent a prescription to your pharmacy, to change metformin to extended-release. Please continue the same other 3 diabetes medications.   check your blood sugar once a day.  vary the time of day when you check, between before the 3 meals, and at bedtime.  also check if you have symptoms of your blood sugar being too high or too low.  please keep a record of the readings and bring it to your next appointment here (or you can bring the meter itself).  You can write it on any piece of paper.  please call us sooner if your blood sugar goes below 70, or if you have a lot of readings over 200. Blood tests are requested for you today.  We'll let you know about the results.  Please come back for a follow-up appointment in 4 months.

## 2020-10-26 NOTE — Progress Notes (Signed)
Subjective:    Patient ID: Terry Harrington, male    DOB: 07-23-1955, 65 y.o.   MRN: [id number]  HPI Pt returns for f/u of diabetes mellitus:  DM type: 2 Dx'ed: 2010 Complications: stage 3 CRI Therapy: Trulicity and 3 oral meds.  DKA: never.  Severe hypoglycemia: never.  Pancreatitis: never Other: he took insulin 2015-2017; in early 2017, he was hospitalized for nonketotic hyperosmolar hyperglycemic state, due to missing insulin doses, he did not tolerate farxiga (arthralgias); he works M-F, day shift.   Interval history: no cbg record, but states cbg's vary from 84-159  He takes meds as rx'ed.   Pt also has primary hypogonadism (uncertain etiology; dx'ed 2018; he has lifelong infertility, but karyotype was normal; he took androgel 2012-2014; testosterone level was low, but he is on max safe dosage (50 mg/d)).  He takes the androgel as rx'ed.    Past Medical History:  Diagnosis Date   B12 deficiency    Back pain    Diabetes mellitus    Drug use    Food allergy    GERD (gastroesophageal reflux disease)    Heart murmur    Hyperlipidemia    Hypertension    on meds   Hypothyroidism    on meds   Joint pain    Lactose intolerance    Sleep apnea    SOB (shortness of breath)    Vitamin D deficiency     Past Surgical History:  Procedure Laterality Date   DENTAL SURGERY     several dental procedure approx 10 between 1999-2012   HERNIA REPAIR     as a child   SHOULDER ARTHROSCOPY  05/31/2011   Procedure: ARTHROSCOPY SHOULDER;  Surgeon: Kennieth Rad, MD;  Location: [facility] OR;  Service: Orthopedics;  Laterality: Left;  Shoulder Acromioplasty Left   SHOULDER SURGERY  05/31/2011    impingment left    Social History   Socioeconomic History   Marital status: Divorced    Spouse name: Not on file   Number of children: Not on file   Years of education: Not on file   Highest education level: Not on file  Occupational History   Occupation: Retired  Tobacco Use   Smoking status:  Former    Packs/day: 1.50    Years: 26.00    Pack years: 39.00    Types: Cigarettes    Quit date: 03/02/2008    Years since quitting: 12.6   Smokeless tobacco: Former    Quit date: 05/25/2008  Vaping Use   Vaping Use: Never used  Substance and Sexual Activity   Alcohol use: Yes    Alcohol/week: 0.0 standard drinks    Comment: rarely   Drug use: No   Sexual activity: Not Currently  Other Topics Concern   Not on file  Social History Narrative   Not on file   Social Determinants of Health   Financial Resource Strain: Not on file  Food Insecurity: Not on file  Transportation Needs: Not on file  Physical Activity: Not on file  Stress: Not on file  Social Connections: Not on file  Intimate Partner Violence: Not on file    Current Outpatient Medications on File Prior to Visit  Medication Sig Dispense Refill   amlodipine-olmesartan (AZOR) 10-20 MG tablet Take 1 tablet by mouth daily. 100 tablet 1   Cholecalciferol (VITAMIN D3) 1.25 MG (50000 UT) CAPS TAKE 1 CAPSULE BY MOUTH ONE TIME PER WEEK 14 capsule 0   fenofibrate (TRICOR) 48 MG  tablet Take 1 tablet (48 mg total) by mouth daily. 100 tablet 1   JARDIANCE 25 MG TABS tablet TAKE 1 TABLET BY MOUTH EVERY DAY 90 tablet 0   levothyroxine (SYNTHROID) 100 MCG tablet Take 1 tablet (100 mcg total) by mouth daily. 100 tablet 0   loratadine (CLARITIN) 10 MG tablet Take 10 mg by mouth daily.     meclizine (ANTIVERT) 25 MG tablet Take 1 tablet (25 mg total) by mouth 3 (three) times daily as needed for dizziness. 270 tablet 1   Multiple Vitamins-Minerals (PRESERVISION AREDS 2) CAPS Take by mouth.     pioglitazone (ACTOS) 45 MG tablet Take 1 tablet (45 mg total) by mouth daily. 100 tablet 3   pravastatin (PRAVACHOL) 40 MG tablet TAKE 1 TABLET BY MOUTH EVERY DAY 90 tablet 1   Semaglutide, 1 MG/DOSE, 4 MG/3ML SOPN Inject 1 mg as directed once a week. 3 mL 0   Testosterone 12.5 MG/ACT (1%) GEL Place 1 Pump onto the skin daily. 75 g 1   vitamin  B-12 (CYANOCOBALAMIN) 1000 MCG tablet Take 2,000 mcg by mouth daily.     No current facility-administered medications on file prior to visit.    Allergies  Allergen Reactions   Crab [Shellfish Allergy] Anaphylaxis   Iodine Anaphylaxis   Lisinopril     cough   Crestor [Rosuvastatin]     Muscle aches   Farxiga [Dapagliflozin] Other (See Comments)    Joint pain and weakness   Iohexol Swelling   Peanut-Containing Drug Products Itching    And whelps   Aspirin Rash   Codeine Rash   Penicillins Rash    Has patient had a PCN reaction causing immediate rash, facial/tongue/throat swelling, SOB or lightheadedness with hypotension: Yes Has patient had a PCN reaction causing severe rash involving mucus membranes or skin necrosis: Yes Has patient had a PCN reaction that required hospitalization No Has patient had a PCN reaction occurring within the last 10 years: No If all of the above answers are "NO", then may proceed with Cephalosporin use.     Family History  Problem Relation Age of Onset   Stroke Mother    Diabetes Mother    Hyperlipidemia Mother    Hypertension Mother    Heart disease Mother    Thyroid disease Mother    Heart disease Father    Hyperlipidemia Father    Hypertension Father    Kidney disease Father    Diabetes Father    Cancer Maternal Grandfather    Anesthesia problems Neg Hx    Hypotension Neg Hx    Malignant hyperthermia Neg Hx    Pseudochol deficiency Neg Hx     BP 130/66 (BP Location: Right Arm, Patient Position: Sitting, Cuff Size: Normal)   Pulse 79   Ht 5\' 9"  (1.753 m)   Wt 205 lb (93 kg)   SpO2 97%   BMI 30.27 kg/m    Review of Systems He has nausea.     Objective:   Physical Exam Pulses: dorsalis pedis intact bilat.   MSK: no deformity of the feet CV: no leg edema Skin:  no ulcer on the feet.  normal color and temp on the feet. Neuro: sensation is intact to touch on the feet.     Lab Results  Component Value Date   HGBA1C 5.8 (A)  10/26/2020   Lab Results  Component Value Date   CREATININE 1.08 08/11/2020   BUN 12 08/11/2020   NA 135 08/11/2020   K  4.2 08/11/2020   CL 99 08/11/2020   CO2 21 08/11/2020   Lab Results  Component Value Date   WBC 4.5 08/11/2020   HGB 14.6 08/11/2020   HCT 44.3 08/11/2020   MCV 92 08/11/2020   PLT 285.0 02/10/2019      Assessment & Plan:  Low testosterone: recheck today Nausea, due to metformin or Trulicity.  Type 2 DM: well-controlled.    Patient Instructions  I have sent a prescription to your pharmacy, to change metformin to extended-release. Please continue the same other 3 diabetes medications.   check your blood sugar once a day.  vary the time of day when you check, between before the 3 meals, and at bedtime.  also check if you have symptoms of your blood sugar being too high or too low.  please keep a record of the readings and bring it to your next appointment here (or you can bring the meter itself).  You can write it on any piece of paper.  please call us sooner if your blood sugar goes below 70, or if you have a lot of readings over 200. Blood tests are requested for you today.  We'll let you know about the results.  Please come back for a follow-up appointment in 4 months.

## 2020-10-28 LAB — TESTOSTERONE,FREE AND TOTAL
Testosterone, Free: 6.7 pg/mL (ref 6.6–18.1)
Testosterone: 184 ng/dL — ABNORMAL LOW (ref 264–916)

## 2020-11-03 ENCOUNTER — Ambulatory Visit (INDEPENDENT_AMBULATORY_CARE_PROVIDER_SITE_OTHER): Payer: Medicare HMO | Admitting: Family Medicine

## 2020-11-03 ENCOUNTER — Encounter (INDEPENDENT_AMBULATORY_CARE_PROVIDER_SITE_OTHER): Payer: Self-pay | Admitting: Family Medicine

## 2020-11-03 ENCOUNTER — Other Ambulatory Visit: Payer: Self-pay

## 2020-11-03 VITALS — BP 119/63 | HR 69 | Temp 97.6°F | Ht 69.0 in | Wt 202.0 lb

## 2020-11-03 DIAGNOSIS — E1169 Type 2 diabetes mellitus with other specified complication: Secondary | ICD-10-CM | POA: Diagnosis not present

## 2020-11-03 DIAGNOSIS — E669 Obesity, unspecified: Secondary | ICD-10-CM

## 2020-11-03 DIAGNOSIS — Z6830 Body mass index (BMI) 30.0-30.9, adult: Secondary | ICD-10-CM | POA: Diagnosis not present

## 2020-11-03 NOTE — Progress Notes (Signed)
Chief Complaint:   OBESITY Terry Harrington is here to discuss his progress with his obesity treatment plan along with follow-up of his obesity related diagnoses. Terry Harrington is on the Category 2 Plan + 100 calories and states he is following his eating plan approximately 75% of the time. Terry Harrington states he is doing 0 minutes 0 times per week.  Today's visit was #: 6 Starting weight: 207 lbs Starting date: 08/11/2020 Today's weight: 202 lbs Today's date: 11/03/2020 Total lbs lost to date: 5 Total lbs lost since last in-office visit: 2  Interim History: Terry Harrington continues to do well with weight loss. He notes his hunger is controlled and he is doing well with meal planning and prepping.  Subjective:   1. Type 2 diabetes mellitus with other specified complication, without long-term current use of insulin (HCC) Terry Harrington continues to do well with diet and exercise. His fasting blood sugars mostly range between 110-120's. He is followed by Dr. Everardo All who recently checked his A1c and was 5.8. his metformin was changed to XR version. I discussed labs with the patient today.  Assessment/Plan:   1. Type 2 diabetes mellitus with other specified complication, without long-term current use of insulin (HCC) Terry Harrington will continue with diet and exercise. He may need to have his medications decreased with continued weight loss, and will watch for hypoglycemia. Good blood sugar control is important to decrease the likelihood of diabetic complications such as nephropathy, neuropathy, limb loss, blindness, coronary artery disease, and death. Intensive lifestyle modification including diet, exercise and weight loss are the first line of treatment for diabetes.   2. Obesity with current BMI 29.8 Terry Harrington is currently in the action stage of change. As such, his goal is to continue with weight loss efforts. He has agreed to the Category 2 Plan + 100 calories.   Exercise goals: Start at the gym 2 times per  week.  Behavioral modification strategies: no skipping meals.  Terry Harrington has agreed to follow-up with our clinic in 3 weeks. He was informed of the importance of frequent follow-up visits to maximize his success with intensive lifestyle modifications for his multiple health conditions.   Objective:   Blood pressure 119/63, pulse 69, temperature 97.6 F (36.4 C), height 5\' 9"  (1.753 m), weight 202 lb (91.6 kg), SpO2 100 %. Body mass index is 29.83 kg/m.  General: Cooperative, alert, well developed, in no acute distress. HEENT: Conjunctivae and lids unremarkable. Cardiovascular: Regular rhythm.  Lungs: Normal work of breathing. Neurologic: No focal deficits.   Lab Results  Component Value Date   CREATININE 1.08 08/11/2020   BUN 12 08/11/2020   NA 135 08/11/2020   K 4.2 08/11/2020   CL 99 08/11/2020   CO2 21 08/11/2020   Lab Results  Component Value Date   ALT 12 08/11/2020   AST 14 08/11/2020   ALKPHOS 68 08/11/2020   BILITOT 0.3 08/11/2020   Lab Results  Component Value Date   HGBA1C 5.8 (A) 10/26/2020   HGBA1C 6.2 (A) 07/23/2020   HGBA1C 6.2 (A) 03/19/2020   HGBA1C 6.3 03/02/2020   HGBA1C 6.7 (A) 11/14/2019   Lab Results  Component Value Date   INSULIN 10.1 08/11/2020   Lab Results  Component Value Date   TSH 1.940 08/11/2020   Lab Results  Component Value Date   CHOL 195 08/11/2020   HDL 67 08/11/2020   LDLCALC 110 (H) 08/11/2020   LDLDIRECT 86.0 02/10/2019   TRIG 99 08/11/2020   CHOLHDL 3 03/02/2020  Lab Results  Component Value Date   VD25OH 60.5 08/11/2020   VD25OH 40.92 03/02/2020   VD25OH 17 (L) 08/21/2019   Lab Results  Component Value Date   WBC 4.5 08/11/2020   HGB 14.6 08/11/2020   HCT 44.3 08/11/2020   MCV 92 08/11/2020   PLT 285.0 02/10/2019   Lab Results  Component Value Date   IRON 60 02/02/2017   FERRITIN 170.0 08/17/2014   Attestation Statements:   Reviewed by clinician on day of visit: allergies, medications, problem  list, medical history, surgical history, family history, social history, and previous encounter notes.  Time spent on visit including pre-visit chart review and post-visit care and charting was 23 minutes.    Trude Mcburney, am acting as transcriptionist for Quillian Quince, MD. Quillian Quince, MD  I have reviewed the above documentation for accuracy and completeness, and I agree with the above. -

## 2020-11-12 ENCOUNTER — Encounter: Payer: Self-pay | Admitting: Internal Medicine

## 2020-11-17 ENCOUNTER — Encounter (INDEPENDENT_AMBULATORY_CARE_PROVIDER_SITE_OTHER): Payer: Self-pay | Admitting: Family Medicine

## 2020-11-17 ENCOUNTER — Ambulatory Visit (INDEPENDENT_AMBULATORY_CARE_PROVIDER_SITE_OTHER): Payer: Medicare HMO | Admitting: Family Medicine

## 2020-11-17 ENCOUNTER — Other Ambulatory Visit: Payer: Self-pay

## 2020-11-17 VITALS — BP 117/56 | HR 65 | Temp 97.7°F | Ht 69.0 in | Wt 200.0 lb

## 2020-11-17 DIAGNOSIS — E1169 Type 2 diabetes mellitus with other specified complication: Secondary | ICD-10-CM | POA: Diagnosis not present

## 2020-11-17 DIAGNOSIS — Z6830 Body mass index (BMI) 30.0-30.9, adult: Secondary | ICD-10-CM

## 2020-11-17 DIAGNOSIS — E669 Obesity, unspecified: Secondary | ICD-10-CM

## 2020-11-17 MED ORDER — SEMAGLUTIDE (1 MG/DOSE) 4 MG/3ML ~~LOC~~ SOPN: 1.0000 mg | PEN_INJECTOR | SUBCUTANEOUS | 0 refills | Status: DC

## 2020-11-17 NOTE — Progress Notes (Signed)
Chief Complaint:   OBESITY Terry Harrington is here to discuss his progress with his obesity treatment plan along with follow-up of his obesity related diagnoses. [name] is on the Category 2 Plan + 100 calories and states he is following his eating plan approximately 80% of the time. [name] states he is walking and hiking for 90 minutes 2 times per week.  Today's visit was #: 7 Starting weight: 207 lbs Starting date: 08/11/2020 Today's weight: 200 lbs Today's date: 11/17/2020 Total lbs lost to date: 7 Total lbs lost since last in-office visit: 2  Interim History: Terry Harrington continues to do well with weight loss on his plan. His hunger is mostly controlled. He will be traveling over Thanksgiving and he is open to discussing holiday eating strategies.  Subjective:   1. Type 2 diabetes mellitus with other specified complication, without long-term current use of insulin (HCC) Terry Harrington is on Ozempic and he notes some increase in GERD symptoms, especially if he tries to eat all of the food on his plan.  Assessment/Plan:   1. Type 2 diabetes mellitus with other specified complication, without long-term current use of insulin (HCC) We will refill Ozempic 1 mg q weekly for 90 days with no refills. Terry Harrington is to concentrate on protein first if he cannot eat everything on his plan. Good blood sugar control is important to decrease the likelihood of diabetic complications such as nephropathy, neuropathy, limb loss, blindness, coronary artery disease, and death. Intensive lifestyle modification including diet, exercise and weight loss are the first line of treatment for diabetes.   2. Obesity BMi today is 23 Terry Harrington is currently in the action stage of change. As such, his goal is to continue with weight loss efforts. He has agreed to the Category 2 Plan + 100 calories.   We will recheck fasting labs at his next visit.  Exercise goals: As is.  Behavioral modification strategies: increasing lean protein  intake and holiday eating strategies .  Terry Harrington has agreed to follow-up with our clinic in 3 weeks. He was informed of the importance of frequent follow-up visits to maximize his success with intensive lifestyle modifications for his multiple health conditions.   Objective:   Blood pressure (!) 117/56, pulse 65, temperature 97.7 F (36.5 C), height 5\' 9"  (1.753 m), weight 200 lb (90.7 kg), SpO2 97 %. Body mass index is 29.53 kg/m.  General: Cooperative, alert, well developed, in no acute distress. HEENT: Conjunctivae and lids unremarkable. Cardiovascular: Regular rhythm.  Lungs: Normal work of breathing. Neurologic: No focal deficits.   Lab Results  Component Value Date   CREATININE 1.08 08/11/2020   BUN 12 08/11/2020   NA 135 08/11/2020   K 4.2 08/11/2020   CL 99 08/11/2020   CO2 21 08/11/2020   Lab Results  Component Value Date   ALT 12 08/11/2020   AST 14 08/11/2020   ALKPHOS 68 08/11/2020   BILITOT 0.3 08/11/2020   Lab Results  Component Value Date   HGBA1C 5.8 (A) 10/26/2020   HGBA1C 6.2 (A) 07/23/2020   HGBA1C 6.2 (A) 03/19/2020   HGBA1C 6.3 03/02/2020   HGBA1C 6.7 (A) 11/14/2019   Lab Results  Component Value Date   INSULIN 10.1 08/11/2020   Lab Results  Component Value Date   TSH 1.940 08/11/2020   Lab Results  Component Value Date   CHOL 195 08/11/2020   HDL 67 08/11/2020   LDLCALC 110 (H) 08/11/2020   LDLDIRECT 86.0 02/10/2019   TRIG 99 08/11/2020  CHOLHDL 3 03/02/2020   Lab Results  Component Value Date   VD25OH 60.5 08/11/2020   VD25OH 40.92 03/02/2020   VD25OH 17 (L) 08/21/2019   Lab Results  Component Value Date   WBC 4.5 08/11/2020   HGB 14.6 08/11/2020   HCT 44.3 08/11/2020   MCV 92 08/11/2020   PLT 285.0 02/10/2019   Lab Results  Component Value Date   IRON 60 02/02/2017   FERRITIN 170.0 08/17/2014    Obesity Behavioral Intervention:   Approximately 15 minutes were spent on the discussion below.  ASK: We discussed  the diagnosis of obesity with [name] today and Parris agreed to give [place] permission to discuss obesity behavioral modification therapy today.  ASSESS: Or has the diagnosis of obesity and his BMI today is 29.5. Jamikal is in the action stage of change.   ADVISE: [name] was educated on the multiple health risks of obesity as well as the benefit of weight loss to improve his health. He was advised of the need for long term treatment and the importance of lifestyle modifications to improve his current health and to decrease his risk of future health problems.  AGREE: Multiple dietary modification options and treatment options were discussed and Terry Harrington agreed to follow the recommendations documented in the above note.  ARRANGE: Terry Harrington was educated on the importance of frequent visits to treat obesity as outlined per CMS and USPSTF guidelines and agreed to schedule his next follow up appointment today.  Attestation Statements:   Reviewed by clinician on day of visit: allergies, medications, problem list, medical history, surgical history, family history, social history, and previous encounter notes.   I, Trixie Dredge, am acting as transcriptionist for Dennard Nip, MD.  I have reviewed the above documentation for accuracy and completeness, and I agree with the above. -  Dennard Nip, MD

## 2020-11-19 ENCOUNTER — Encounter: Payer: Self-pay | Admitting: Endocrinology

## 2020-11-19 ENCOUNTER — Other Ambulatory Visit: Payer: Self-pay | Admitting: Endocrinology

## 2020-11-19 MED ORDER — TESTOSTERONE 12.5 MG/ACT (1%) TD GEL: 1.0000 | Freq: Every day | TRANSDERMAL | 2 refills | Status: DC

## 2020-11-24 ENCOUNTER — Encounter: Payer: Self-pay | Admitting: Internal Medicine

## 2020-12-08 ENCOUNTER — Encounter (INDEPENDENT_AMBULATORY_CARE_PROVIDER_SITE_OTHER): Payer: Self-pay | Admitting: Family Medicine

## 2020-12-08 ENCOUNTER — Other Ambulatory Visit (INDEPENDENT_AMBULATORY_CARE_PROVIDER_SITE_OTHER): Payer: Self-pay | Admitting: Family Medicine

## 2020-12-08 ENCOUNTER — Other Ambulatory Visit: Payer: Self-pay

## 2020-12-08 ENCOUNTER — Ambulatory Visit (INDEPENDENT_AMBULATORY_CARE_PROVIDER_SITE_OTHER): Payer: Medicare HMO | Admitting: Family Medicine

## 2020-12-08 VITALS — BP 118/69 | HR 67 | Temp 97.7°F | Ht 69.0 in | Wt 200.0 lb

## 2020-12-08 DIAGNOSIS — Z6830 Body mass index (BMI) 30.0-30.9, adult: Secondary | ICD-10-CM | POA: Diagnosis not present

## 2020-12-08 DIAGNOSIS — E1169 Type 2 diabetes mellitus with other specified complication: Secondary | ICD-10-CM

## 2020-12-08 DIAGNOSIS — E669 Obesity, unspecified: Secondary | ICD-10-CM

## 2020-12-08 DIAGNOSIS — E782 Mixed hyperlipidemia: Secondary | ICD-10-CM | POA: Diagnosis not present

## 2020-12-08 DIAGNOSIS — E538 Deficiency of other specified B group vitamins: Secondary | ICD-10-CM | POA: Diagnosis not present

## 2020-12-08 DIAGNOSIS — E039 Hypothyroidism, unspecified: Secondary | ICD-10-CM | POA: Diagnosis not present

## 2020-12-08 DIAGNOSIS — E559 Vitamin D deficiency, unspecified: Secondary | ICD-10-CM

## 2020-12-08 MED ORDER — SEMAGLUTIDE (1 MG/DOSE) 4 MG/3ML ~~LOC~~ SOPN: 1.0000 mg | PEN_INJECTOR | SUBCUTANEOUS | 0 refills | Status: DC

## 2020-12-08 NOTE — Telephone Encounter (Signed)
This medication needed to be sent in for #90.  Please advise.

## 2020-12-08 NOTE — Progress Notes (Signed)
Chief Complaint:   OBESITY Terry Harrington is here to discuss his progress with his obesity treatment plan along with follow-up of his obesity related diagnoses. Terry Harrington is on the Category 2 Plan + 100 calories and states he is following his eating plan approximately 75% of the time. Terry Harrington states he is walking for 60 minutes 2 times per week.  Today's visit was #: 8 Starting weight: 207 lbs Starting date: 08/11/2020 Today's weight: 200 lbs Today's date: 12/08/2020 Total lbs lost to date: 7 Total lbs lost since last in-office visit: 0  Interim History: Terry Harrington has done very well avoiding weight gain over Thanksgiving. He did well with portion control over Thanksgiving, and his hunger is mostly controlled.  Subjective:   1. Hypothyroidism, unspecified type Terry Harrington is stable on Synthroid, and his last labs were within normal limits. His labs are due to be checked again, as he is at risk of over-replacement with weight loss and diet changes.  2. Type 2 diabetes mellitus with other specified complication, without long-term current use of insulin (HCC) Terry Harrington is doing well on Ozempic, and he denies nausea, vomiting, or hypoglycemia.  3. Vitamin D deficiency Terry Harrington is stable on Vit D, and he is due to have his level rechecked.  4. B12 deficiency Terry Harrington is on OTC B12 and a high B12 diet. He is due to have labs checked.  5. Mixed hyperlipidemia Terry Harrington is working on diet and exercise, and he is due to have labs checked. He is stable on Tricor and statin, and he denies chest pain or myalgias.  Assessment/Plan:   1. Hypothyroidism, unspecified type We will check labs today, and will follow up at Terry Harrington next visit. Orders and follow up as documented in patient record.  - TSH  2. Type 2 diabetes mellitus with other specified complication, without long-term current use of insulin (HCC) We will check labs today, and we will refill Ozempic for 90 days with no refills. Terry Harrington will  continue metformin. Good blood sugar control is important to decrease the likelihood of diabetic complications such as nephropathy, neuropathy, limb loss, blindness, coronary artery disease, and death. Intensive lifestyle modification including diet, exercise and weight loss are the first line of treatment for diabetes.   - CMP14+EGFR - Insulin, random - Hemoglobin A1c - Semaglutide, 1 MG/DOSE, 4 MG/3ML SOPN; Inject 1 mg as directed once a week.  Dispense: 9 mL; Refill: 0  3. Vitamin D deficiency Low Vitamin D level contributes to fatigue and are associated with obesity, breast, and colon cancer. We will check labs today. Terry Harrington will follow-up for routine testing of Vitamin D, at least 2-3 times per year to avoid over-replacement.  - VITAMIN D 25 Hydroxy (Vit-D Deficiency, Fractures)  4. B12 deficiency The diagnosis was reviewed with the patient. We will check labs today, and will follow up at Terry Harrington next visit. Orders and follow up as documented in patient record.  - Vitamin B12  5. Mixed hyperlipidemia Cardiovascular risk and specific lipid/LDL goals reviewed. We discussed several lifestyle modifications today. We will check labs today. Terry Harrington will continue his diet and medications. Orders and follow up as documented in patient record.   - Lipid Panel With LDL/HDL Ratio  6. Obesity BMI today is 91 Terry Harrington is currently in the action stage of change. As such, his goal is to continue with weight loss efforts. He has agreed to the Category 2 Plan.   Exercise goals: As is.  Behavioral modification strategies: increasing lean protein intake and  holiday eating strategies .  Terry Harrington has agreed to follow-up with our clinic in 3 weeks. He was informed of the importance of frequent follow-up visits to maximize his success with intensive lifestyle modifications for his multiple health conditions.   Terry Harrington was informed we would discuss his lab results at his next visit unless there is a  critical issue that needs to be addressed sooner. Terry Harrington agreed to keep his next visit at the agreed upon time to discuss these results.  Objective:   Blood pressure 118/69, pulse 67, temperature 97.7 F (36.5 C), height $RemoveBe'5\' 9"'[id number]$  (1.753 m), weight 200 lb (90.7 kg), SpO2 98 %. Body mass index is 29.53 kg/m.  General: Cooperative, alert, well developed, in no acute distress. HEENT: Conjunctivae and lids unremarkable. Cardiovascular: Regular rhythm.  Lungs: Normal work of breathing. Neurologic: No focal deficits.   Lab Results  Component Value Date   CREATININE 1.08 08/11/2020   BUN 12 08/11/2020   NA 135 08/11/2020   K 4.2 08/11/2020   CL 99 08/11/2020   CO2 21 08/11/2020   Lab Results  Component Value Date   ALT 12 08/11/2020   AST 14 08/11/2020   ALKPHOS 68 08/11/2020   BILITOT 0.3 08/11/2020   Lab Results  Component Value Date   HGBA1C 5.8 (A) 10/26/2020   HGBA1C 6.2 (A) 07/23/2020   HGBA1C 6.2 (A) 03/19/2020   HGBA1C 6.3 03/02/2020   HGBA1C 6.7 (A) 11/14/2019   Lab Results  Component Value Date   INSULIN 10.1 08/11/2020   Lab Results  Component Value Date   TSH 1.940 08/11/2020   Lab Results  Component Value Date   CHOL 195 08/11/2020   HDL 67 08/11/2020   LDLCALC 110 (H) 08/11/2020   LDLDIRECT 86.0 02/10/2019   TRIG 99 08/11/2020   CHOLHDL 3 03/02/2020   Lab Results  Component Value Date   VD25OH 60.5 08/11/2020   VD25OH 40.92 03/02/2020   VD25OH 17 (L) 08/21/2019   Lab Results  Component Value Date   WBC 4.5 08/11/2020   HGB 14.6 08/11/2020   HCT 44.3 08/11/2020   MCV 92 08/11/2020   PLT 285.0 02/10/2019   Lab Results  Component Value Date   IRON 60 02/02/2017   FERRITIN 170.0 08/17/2014    Obesity Behavioral Intervention:   Approximately 15 minutes were spent on the discussion below.  ASK: We discussed the diagnosis of obesity with Terry Harrington today and Terry Harrington agreed to give [place] permission to discuss obesity behavioral modification  therapy today.  ASSESS: Terry Harrington has the diagnosis of obesity and his BMI today is 29.6. Terry Harrington is in the action stage of change.   ADVISE: Deroy was educated on the multiple health risks of obesity as well as the benefit of weight loss to improve his health. He was advised of the need for long term treatment and the importance of lifestyle modifications to improve his current health and to decrease his risk of future health problems.  AGREE: Multiple dietary modification options and treatment options were discussed and Jacub agreed to follow the recommendations documented in the above note.  ARRANGE: Yasmin was educated on the importance of frequent visits to treat obesity as outlined per CMS and USPSTF guidelines and agreed to schedule his next follow up appointment today.  Attestation Statements:   Reviewed by clinician on day of visit: allergies, medications, problem list, medical history, surgical history, family history, social history, and previous encounter notes.   Terry Harrington, am acting as Location manager for [employer]  Leafy Ro, MD.  I have reviewed the above documentation for accuracy and completeness, and I agree with the above. -  Dennard Nip, MD

## 2020-12-09 ENCOUNTER — Other Ambulatory Visit (INDEPENDENT_AMBULATORY_CARE_PROVIDER_SITE_OTHER): Payer: Self-pay | Admitting: Emergency Medicine

## 2020-12-09 ENCOUNTER — Other Ambulatory Visit (HOSPITAL_COMMUNITY): Payer: Self-pay

## 2020-12-09 ENCOUNTER — Other Ambulatory Visit (INDEPENDENT_AMBULATORY_CARE_PROVIDER_SITE_OTHER): Payer: Self-pay | Admitting: Family Medicine

## 2020-12-09 ENCOUNTER — Encounter (INDEPENDENT_AMBULATORY_CARE_PROVIDER_SITE_OTHER): Payer: Self-pay | Admitting: Family Medicine

## 2020-12-09 DIAGNOSIS — E1169 Type 2 diabetes mellitus with other specified complication: Secondary | ICD-10-CM

## 2020-12-09 LAB — CMP14+EGFR
ALT: 11 IU/L (ref 0–44)
AST: 12 IU/L (ref 0–40)
Albumin/Globulin Ratio: 1.4 (ref 1.2–2.2)
Albumin: 4.2 g/dL (ref 3.8–4.8)
Alkaline Phosphatase: 77 IU/L (ref 44–121)
BUN/Creatinine Ratio: 12 (ref 10–24)
BUN: 13 mg/dL (ref 8–27)
Bilirubin Total: 0.2 mg/dL (ref 0.0–1.2)
CO2: 20 mmol/L (ref 20–29)
Calcium: 9.8 mg/dL (ref 8.6–10.2)
Chloride: 106 mmol/L (ref 96–106)
Creatinine, Ser: 1.11 mg/dL (ref 0.76–1.27)
Globulin, Total: 2.9 g/dL (ref 1.5–4.5)
Glucose: 98 mg/dL (ref 70–99)
Potassium: 4.7 mmol/L (ref 3.5–5.2)
Sodium: 141 mmol/L (ref 134–144)
Total Protein: 7.1 g/dL (ref 6.0–8.5)
eGFR: 73 mL/min/{1.73_m2} (ref >59)

## 2020-12-09 LAB — TSH: TSH: 0.884 u[IU]/mL (ref 0.450–4.500)

## 2020-12-09 LAB — VITAMIN B12: Vitamin B-12: 260 pg/mL (ref 232–1245)

## 2020-12-09 LAB — HEMOGLOBIN A1C
Est. average glucose Bld gHb Est-mCnc: 126 mg/dL
Hgb A1c MFr Bld: 6 % — ABNORMAL HIGH (ref 4.8–5.6)

## 2020-12-09 LAB — LIPID PANEL WITH LDL/HDL RATIO
Cholesterol, Total: 181 mg/dL (ref 100–199)
HDL: 63 mg/dL (ref >39)
LDL Chol Calc (NIH): 98 mg/dL (ref 0–99)
LDL/HDL Ratio: 1.6 ratio (ref 0.0–3.6)
Triglycerides: 112 mg/dL (ref 0–149)
VLDL Cholesterol Cal: 20 mg/dL (ref 5–40)

## 2020-12-09 LAB — INSULIN, RANDOM: INSULIN: 6.2 u[IU]/mL (ref 2.6–24.9)

## 2020-12-09 LAB — VITAMIN D 25 HYDROXY (VIT D DEFICIENCY, FRACTURES): Vit D, 25-Hydroxy: 49.9 ng/mL (ref 30.0–100.0)

## 2020-12-09 MED ORDER — SEMAGLUTIDE (1 MG/DOSE) 4 MG/3ML ~~LOC~~ SOPN
1.0000 mg | PEN_INJECTOR | SUBCUTANEOUS | 0 refills | Status: DC
  Filled 2020-12-09: qty 3, 28d supply, fill #0

## 2020-12-09 NOTE — Telephone Encounter (Signed)
Rx has been sent to [facility] and patient was notified.

## 2020-12-09 NOTE — Telephone Encounter (Signed)
Called [facility] pharmacy to see if they had Ozempic in stock. Which they do have a limited supply. I left patient a msg to see if he is willing to go to [facility].  Waiting on a Mychart msg or a return call from patient.

## 2020-12-24 ENCOUNTER — Other Ambulatory Visit: Payer: Self-pay | Admitting: Internal Medicine

## 2020-12-24 DIAGNOSIS — E559 Vitamin D deficiency, unspecified: Secondary | ICD-10-CM

## 2020-12-24 MED ORDER — VITAMIN D3 1.25 MG (50000 UT) PO CAPS: ORAL_CAPSULE | ORAL | 0 refills | Status: DC

## 2020-12-28 ENCOUNTER — Other Ambulatory Visit (HOSPITAL_COMMUNITY): Payer: Self-pay

## 2020-12-28 ENCOUNTER — Other Ambulatory Visit: Payer: Self-pay

## 2020-12-28 ENCOUNTER — Encounter (INDEPENDENT_AMBULATORY_CARE_PROVIDER_SITE_OTHER): Payer: Self-pay | Admitting: Family Medicine

## 2020-12-28 ENCOUNTER — Ambulatory Visit (INDEPENDENT_AMBULATORY_CARE_PROVIDER_SITE_OTHER): Payer: Medicare HMO | Admitting: Family Medicine

## 2020-12-28 VITALS — BP 119/70 | HR 65 | Temp 97.5°F | Ht 69.0 in | Wt 199.0 lb

## 2020-12-28 DIAGNOSIS — E1169 Type 2 diabetes mellitus with other specified complication: Secondary | ICD-10-CM | POA: Diagnosis not present

## 2020-12-28 DIAGNOSIS — I10 Essential (primary) hypertension: Secondary | ICD-10-CM | POA: Diagnosis not present

## 2020-12-28 DIAGNOSIS — Z6830 Body mass index (BMI) 30.0-30.9, adult: Secondary | ICD-10-CM

## 2020-12-28 DIAGNOSIS — E669 Obesity, unspecified: Secondary | ICD-10-CM | POA: Diagnosis not present

## 2020-12-28 DIAGNOSIS — E66811 Obesity, class 1: Secondary | ICD-10-CM

## 2020-12-28 MED ORDER — SEMAGLUTIDE (1 MG/DOSE) 4 MG/3ML ~~LOC~~ SOPN
1.0000 mg | PEN_INJECTOR | SUBCUTANEOUS | 0 refills | Status: AC
  Filled 2020-12-28: qty 3, 28d supply, fill #0

## 2020-12-28 NOTE — Progress Notes (Signed)
Chief Complaint:   OBESITY Terry Harrington is here to discuss his progress with his obesity treatment plan along with follow-up of his obesity related diagnoses. Terry Harrington is on the Category 2 Plan and states he is following his eating plan approximately 80% of the time. Terry Harrington states he is riding the stationary bike for 90 minutes 2 times per week.  Today's visit was #: 9 Starting weight: 207 lbs Starting date: 08/11/2020 Today's weight: 199 lbs Today's date: 12/28/2020 Total lbs lost to date: 8 Total lbs lost since last in-office visit: 2  Interim History: Terry Harrington continues to do well with weight loss even with extra holiday temptations. He will be traveling over Christmas, but he is planning on following his plan as much as he can during that time.  Subjective:   1. Type 2 diabetes mellitus with other specified complication, without long-term current use of insulin (HCC) Terry Harrington will continue to do well with his diet. No hypoglycemia noted. He has no nausea or vomiting with Ozempic.  2. Essential hypertension Terry Harrington's blood pressure is well controlled with diet and his medications. He has no signs of hypotension or dehydration.  Assessment/Plan:   1. Type 2 diabetes mellitus with other specified complication, without long-term current use of insulin (HCC) We will refill Ozempic for 1 month, and Terry Harrington will continue Jardiance and metformin. We will recheck labs in 2 months. Good blood sugar control is important to decrease the likelihood of diabetic complications such as nephropathy, neuropathy, limb loss, blindness, coronary artery disease, and death. Intensive lifestyle modification including diet, exercise and weight loss are the first line of treatment for diabetes.   - Semaglutide, 1 MG/DOSE, 4 MG/3ML SOPN; Inject 1 mg as directed once a week.  Dispense: 3 mL; Refill: 0  2. Essential hypertension Terry Harrington will continue with diet and exercise, and increase his water intake. We will  continue to follow as he continues his lifestyle modifications.  3. Obesity BMI today is 29.5 Terry Harrington is currently in the action stage of change. As such, his goal is to continue with weight loss efforts. He has agreed to the Category 2 Plan.   Exercise goals: As is.  Behavioral modification strategies: travel eating strategies and holiday eating strategies .  Terry Harrington has agreed to follow-up with our clinic in 3 weeks. He was informed of the importance of frequent follow-up visits to maximize his success with intensive lifestyle modifications for his multiple health conditions.   Objective:   Blood pressure 119/70, pulse 65, temperature (!) 97.5 F (36.4 C), height 5\' 9"  (1.753 m), weight 199 lb (90.3 kg), SpO2 99 %. Body mass index is 29.39 kg/m.  General: Cooperative, alert, well developed, in no acute distress. HEENT: Conjunctivae and lids unremarkable. Cardiovascular: Regular rhythm.  Lungs: Normal work of breathing. Neurologic: No focal deficits.   Lab Results  Component Value Date   CREATININE 1.11 12/08/2020   BUN 13 12/08/2020   NA 141 12/08/2020   K 4.7 12/08/2020   CL 106 12/08/2020   CO2 20 12/08/2020   Lab Results  Component Value Date   ALT 11 12/08/2020   AST 12 12/08/2020   ALKPHOS 77 12/08/2020   BILITOT 0.2 12/08/2020   Lab Results  Component Value Date   HGBA1C 6.0 (H) 12/08/2020   HGBA1C 5.8 (A) 10/26/2020   HGBA1C 6.2 (A) 07/23/2020   HGBA1C 6.2 (A) 03/19/2020   HGBA1C 6.3 03/02/2020   Lab Results  Component Value Date   INSULIN 6.2 12/08/2020  INSULIN 10.1 08/11/2020   Lab Results  Component Value Date   TSH 0.884 12/08/2020   Lab Results  Component Value Date   CHOL 181 12/08/2020   HDL 63 12/08/2020   LDLCALC 98 12/08/2020   LDLDIRECT 86.0 02/10/2019   TRIG 112 12/08/2020   CHOLHDL 3 03/02/2020   Lab Results  Component Value Date   VD25OH 49.9 12/08/2020   VD25OH 60.5 08/11/2020   VD25OH 40.92 03/02/2020   Lab Results   Component Value Date   WBC 4.5 08/11/2020   HGB 14.6 08/11/2020   HCT 44.3 08/11/2020   MCV 92 08/11/2020   PLT 285.0 02/10/2019   Lab Results  Component Value Date   IRON 60 02/02/2017   FERRITIN 170.0 08/17/2014    Obesity Behavioral Intervention:   Approximately 15 minutes were spent on the discussion below.  ASK: We discussed the diagnosis of obesity with Terry Harrington today and Terry Harrington agreed to give [place] permission to discuss obesity behavioral modification therapy today.  ASSESS: Terry Harrington has the diagnosis of obesity and his BMI today is 29.5. Terry Harrington is in the action stage of change.   ADVISE: Terry Harrington was educated on the multiple health risks of obesity as well as the benefit of weight loss to improve his health. He was advised of the need for long term treatment and the importance of lifestyle modifications to improve his current health and to decrease his risk of future health problems.  AGREE: Multiple dietary modification options and treatment options were discussed and Terry Harrington agreed to follow the recommendations documented in the above note.  ARRANGE: Terry Harrington was educated on the importance of frequent visits to treat obesity as outlined per CMS and USPSTF guidelines and agreed to schedule his next follow up appointment today.  Attestation Statements:   Reviewed by clinician on day of visit: allergies, medications, problem list, medical history, surgical history, family history, social history, and previous encounter notes.   I, Trixie Dredge, am acting as transcriptionist for Dennard Nip, MD.  I have reviewed the above documentation for accuracy and completeness, and I agree with the above. -  Dennard Nip, MD

## 2020-12-30 ENCOUNTER — Other Ambulatory Visit (HOSPITAL_COMMUNITY): Payer: Self-pay

## 2021-01-07 ENCOUNTER — Other Ambulatory Visit (HOSPITAL_COMMUNITY): Payer: Self-pay

## 2021-01-07 ENCOUNTER — Telehealth: Payer: Self-pay

## 2021-01-07 NOTE — Telephone Encounter (Signed)
PA for Testosterone gel pump expires on 01/08/22.  Copay is $3.95.

## 2021-01-13 ENCOUNTER — Other Ambulatory Visit: Payer: Self-pay | Admitting: Internal Medicine

## 2021-01-13 DIAGNOSIS — E118 Type 2 diabetes mellitus with unspecified complications: Secondary | ICD-10-CM

## 2021-01-18 ENCOUNTER — Other Ambulatory Visit: Payer: Self-pay

## 2021-01-18 ENCOUNTER — Ambulatory Visit (INDEPENDENT_AMBULATORY_CARE_PROVIDER_SITE_OTHER): Payer: Medicare HMO | Admitting: Family Medicine

## 2021-01-18 ENCOUNTER — Other Ambulatory Visit: Payer: Self-pay | Admitting: Internal Medicine

## 2021-01-18 ENCOUNTER — Encounter (INDEPENDENT_AMBULATORY_CARE_PROVIDER_SITE_OTHER): Payer: Self-pay | Admitting: Family Medicine

## 2021-01-18 VITALS — BP 105/58 | HR 69 | Temp 98.4°F | Ht 69.0 in | Wt 197.0 lb

## 2021-01-18 DIAGNOSIS — I10 Essential (primary) hypertension: Secondary | ICD-10-CM

## 2021-01-18 DIAGNOSIS — E669 Obesity, unspecified: Secondary | ICD-10-CM

## 2021-01-18 DIAGNOSIS — Z6830 Body mass index (BMI) 30.0-30.9, adult: Secondary | ICD-10-CM

## 2021-01-18 DIAGNOSIS — E1169 Type 2 diabetes mellitus with other specified complication: Secondary | ICD-10-CM

## 2021-01-18 DIAGNOSIS — Z6829 Body mass index (BMI) 29.0-29.9, adult: Secondary | ICD-10-CM | POA: Diagnosis not present

## 2021-01-18 DIAGNOSIS — E118 Type 2 diabetes mellitus with unspecified complications: Secondary | ICD-10-CM

## 2021-01-18 DIAGNOSIS — N1831 Chronic kidney disease, stage 3a: Secondary | ICD-10-CM

## 2021-01-19 NOTE — Progress Notes (Signed)
Chief Complaint:   OBESITY Lashon is here to discuss his progress with his obesity treatment plan along with follow-up of his obesity related diagnoses. Demaree is on the Category 2 Plan and states he is following his eating plan approximately 80% of the time. Josephe states he is biking for 90 minutes 2 times per week.  Today's visit was #: 10 Starting weight: 207 lbs Starting date: 08/11/2020 Today's weight: 197 lbs Today's date: 01/18/2021 Total lbs lost to date: 10 Total lbs lost since last in-office visit: 2  Interim History: Bunnie continues to do very well with weight loss even over the holidays. He is now engaged and he wants to continue to work on his health and weight goals. He has questions about what his goals should be.  Subjective:   1. Type 2 diabetes mellitus with other specified complication, without long-term current use of insulin (HCC) Gunter is working on diet and he is ready to start exercising to help control his blood sugars. No side effects were noted, and he denies hypoglycemia.  Assessment/Plan:   1. Type 2 diabetes mellitus with other specified complication, without long-term current use of insulin (HCC) Strengthening exercises were discussed with [name] in addition to diet and biking, and to help treat sarcopenia obesity. We will continue to monitor. Good blood sugar control is important to decrease the likelihood of diabetic complications such as nephropathy, neuropathy, limb loss, blindness, coronary artery disease, and death. Intensive lifestyle modification including diet, exercise and weight loss are the first line of treatment for diabetes.   2. Obesity BMI today is 29.1 Trevionne is currently in the action stage of change. As such, his goal is to continue with weight loss efforts. He has agreed to the Category 2 Plan.   We discussed the importance of decreasing visceral fat and increasing muscle mass as he has a degree of sarcopenia. He will work  on increasing his muscle mass in addition to cardio.  Exercise goals: As is, add core strengthening and leg strengthening.  Behavioral modification strategies: increasing lean protein intake and meal planning and cooking strategies.  Alissa has agreed to follow-up with our clinic in 3 weeks. He was informed of the importance of frequent follow-up visits to maximize his success with intensive lifestyle modifications for his multiple health conditions.   Objective:   Blood pressure (!) 105/58, pulse 69, temperature 98.4 F (36.9 C), height 5\' 9"  (1.753 m), weight 197 lb (89.4 kg), SpO2 99 %. Body mass index is 29.09 kg/m.  General: Cooperative, alert, well developed, in no acute distress. HEENT: Conjunctivae and lids unremarkable. Cardiovascular: Regular rhythm.  Lungs: Normal work of breathing. Neurologic: No focal deficits.   Lab Results  Component Value Date   CREATININE 1.11 12/08/2020   BUN 13 12/08/2020   NA 141 12/08/2020   K 4.7 12/08/2020   CL 106 12/08/2020   CO2 20 12/08/2020   Lab Results  Component Value Date   ALT 11 12/08/2020   AST 12 12/08/2020   ALKPHOS 77 12/08/2020   BILITOT 0.2 12/08/2020   Lab Results  Component Value Date   HGBA1C 6.0 (H) 12/08/2020   HGBA1C 5.8 (A) 10/26/2020   HGBA1C 6.2 (A) 07/23/2020   HGBA1C 6.2 (A) 03/19/2020   HGBA1C 6.3 03/02/2020   Lab Results  Component Value Date   INSULIN 6.2 12/08/2020   INSULIN 10.1 08/11/2020   Lab Results  Component Value Date   TSH 0.884 12/08/2020   Lab Results  Component Value Date   CHOL 181 12/08/2020   HDL 63 12/08/2020   LDLCALC 98 12/08/2020   LDLDIRECT 86.0 02/10/2019   TRIG 112 12/08/2020   CHOLHDL 3 03/02/2020   Lab Results  Component Value Date   VD25OH 49.9 12/08/2020   VD25OH 60.5 08/11/2020   VD25OH 40.92 03/02/2020   Lab Results  Component Value Date   WBC 4.5 08/11/2020   HGB 14.6 08/11/2020   HCT 44.3 08/11/2020   MCV 92 08/11/2020   PLT 285.0 02/10/2019    Lab Results  Component Value Date   IRON 60 02/02/2017   FERRITIN 170.0 08/17/2014   Attestation Statements:   Reviewed by clinician on day of visit: allergies, medications, problem list, medical history, surgical history, family history, social history, and previous encounter notes.  Time spent on visit including pre-visit chart review and post-visit care and charting was 32 minutes.    I, Trixie Dredge, am acting as transcriptionist for Dennard Nip, MD.  I have reviewed the above documentation for accuracy and completeness, and I agree with the above. -  Dennard Nip, MD

## 2021-02-01 ENCOUNTER — Ambulatory Visit: Payer: Medicare HMO | Admitting: Plastic Surgery

## 2021-02-01 ENCOUNTER — Encounter: Payer: Self-pay | Admitting: Plastic Surgery

## 2021-02-01 ENCOUNTER — Other Ambulatory Visit: Payer: Self-pay

## 2021-02-01 VITALS — Wt 205.6 lb

## 2021-02-01 DIAGNOSIS — E781 Pure hyperglyceridemia: Secondary | ICD-10-CM | POA: Diagnosis not present

## 2021-02-01 DIAGNOSIS — M6208 Separation of muscle (nontraumatic), other site: Secondary | ICD-10-CM | POA: Diagnosis not present

## 2021-02-01 DIAGNOSIS — M1612 Unilateral primary osteoarthritis, left hip: Secondary | ICD-10-CM

## 2021-02-01 DIAGNOSIS — E118 Type 2 diabetes mellitus with unspecified complications: Secondary | ICD-10-CM | POA: Diagnosis not present

## 2021-02-01 NOTE — Progress Notes (Signed)
Subjective:    Patient ID: Terry Harrington, male    DOB: [birth year], 66 y.o.   MRN: [id number]  Patient is a 66 year old male here for further evaluation of his abdomen.  I first saw him in August 2021 for evaluation of his gastric bulge.  He did have a hernia repair as a child but has not had any other additional surgeries.  His main complaint is the nominal bulge and pain that spans about 15 cm of his upper abdomen.  He was originally referred by Dr. Sheliah Hatch.  He is 5 feet 9 inches tall and weighed 215 pounds at the first visit.  He is now 205 pounds today.  He has a history of diabetes, scoliosis, hypertension and hyperlipidemia.  He has not been smoking for some time.  He is working with Dr. Dalbert Garnet on weight reduction and is doing a really good job.  He states that the pain and discomfort is still present.  He denies any other issues at this time.   Review of Systems  Constitutional:  Positive for activity change. Negative for appetite change.  Eyes: Negative.   Respiratory: Negative.  Negative for chest tightness and shortness of breath.   Cardiovascular: Negative.   Gastrointestinal:  Positive for abdominal distention and abdominal pain.  Genitourinary: Negative.   Musculoskeletal:  Positive for back pain.  Skin: Negative.   Psychiatric/Behavioral: Negative.        Objective:   Physical Exam Constitutional:      Appearance: Normal appearance.  Cardiovascular:     Rate and Rhythm: Normal rate.     Pulses: Normal pulses.  Pulmonary:     Effort: Pulmonary effort is normal. No respiratory distress.  Abdominal:     Palpations: Abdomen is soft. There is no mass.     Tenderness: There is abdominal tenderness. There is no guarding or rebound.     Hernia: No hernia is present.  Musculoskeletal:        General: No swelling or deformity.  Skin:    General: Skin is warm.     Capillary Refill: Capillary refill takes less than 2 seconds.     Coloration: Skin is not jaundiced.      Findings: No bruising or lesion.  Neurological:     Mental Status: He is alert and oriented to person, place, and time.  Psychiatric:        Mood and Affect: Mood normal.        Behavior: Behavior normal.        Thought Content: Thought content normal.        Judgment: Judgment normal.       Assessment & Plan:     ICD-10-CM   1. Pure hypertriglyceridemia  E78.1     2. Rectus diastasis  M62.08     3. Primary osteoarthritis of left hip  M16.12     4. Type II diabetes mellitus with manifestations (HCC)  E11.8       Pictures were obtained of the patient and placed in the chart with the patient's or guardian's permission.  I still think the patient is a good candidate for the surgery but needs to bring his weight down in order to have the benefit.  My concern is that he will be still full on the inside and not will be approaching out against the repair.  The patient is planning on going down to even 190 pounds.  This would be a huge help.  He would like to  come back and see me in 6 months for reevaluation.  I am happy to see him.

## 2021-02-08 ENCOUNTER — Other Ambulatory Visit: Payer: Self-pay

## 2021-02-08 ENCOUNTER — Ambulatory Visit (INDEPENDENT_AMBULATORY_CARE_PROVIDER_SITE_OTHER): Payer: Medicare HMO | Admitting: Family Medicine

## 2021-02-08 ENCOUNTER — Encounter (INDEPENDENT_AMBULATORY_CARE_PROVIDER_SITE_OTHER): Payer: Self-pay | Admitting: Family Medicine

## 2021-02-08 VITALS — BP 122/65 | HR 71 | Temp 97.9°F | Ht 69.0 in | Wt 200.0 lb

## 2021-02-08 DIAGNOSIS — E559 Vitamin D deficiency, unspecified: Secondary | ICD-10-CM

## 2021-02-08 DIAGNOSIS — E669 Obesity, unspecified: Secondary | ICD-10-CM | POA: Diagnosis not present

## 2021-02-08 DIAGNOSIS — Z7984 Long term (current) use of oral hypoglycemic drugs: Secondary | ICD-10-CM | POA: Diagnosis not present

## 2021-02-08 DIAGNOSIS — E1159 Type 2 diabetes mellitus with other circulatory complications: Secondary | ICD-10-CM | POA: Diagnosis not present

## 2021-02-08 DIAGNOSIS — E66811 Obesity, class 1: Secondary | ICD-10-CM

## 2021-02-08 DIAGNOSIS — I152 Hypertension secondary to endocrine disorders: Secondary | ICD-10-CM | POA: Diagnosis not present

## 2021-02-08 DIAGNOSIS — Z6830 Body mass index (BMI) 30.0-30.9, adult: Secondary | ICD-10-CM

## 2021-02-08 DIAGNOSIS — Z6829 Body mass index (BMI) 29.0-29.9, adult: Secondary | ICD-10-CM | POA: Diagnosis not present

## 2021-02-08 DIAGNOSIS — E1169 Type 2 diabetes mellitus with other specified complication: Secondary | ICD-10-CM | POA: Diagnosis not present

## 2021-02-08 MED ORDER — OZEMPIC (1 MG/DOSE) 4 MG/3ML ~~LOC~~ SOPN: 1.0000 mg | PEN_INJECTOR | SUBCUTANEOUS | 0 refills | Status: DC

## 2021-02-08 NOTE — Progress Notes (Signed)
Chief Complaint:   OBESITY Terry Harrington is here to discuss his progress with his obesity treatment plan along with follow-up of his obesity related diagnoses. Terry Harrington is on the Category 2 Plan and states he is following his eating plan approximately 90% of the time. Terry Harrington states he is rising the bike and on the exercise machines for 90 minutes 2 times per week.  Today's visit was #: 11 Starting weight: 207 lbs Starting date: 08/11/2020 Today's weight: 200 lbs Today's date: 02/08/2021 Total lbs lost to date: 7 Total lbs lost since last in-office visit: +3  Interim History: Terry Harrington ate out for dinner with family 4 times in the past couple of weeks, and he normally only eats out 1-2 times in a 2 week time period. He has had more starches and lower protein meals. He is up 3 lbs.      Subjective:   1. Type 2 diabetes mellitus with other specified complication, without long-term current use of insulin (HCC) Terry Harrington's fasting blood sugars range between 97-105. No lows and highest at 158, 2 hour post prandial. He denies reflux or constipation. He is taking metformin, Actos, and Ozempic, and he is tolerating medication(s) well without side effects. Medication compliance is good and patient appears to be taking it as prescribed. Denies additional concerns regarding this condition.   2. Hypertension associated with type 2 diabetes mellitus ([place]) Terry Harrington's blood pressure numbers are about the same at home as they are here in the office. He is taking Azor, and his blood pressure is at goal.  3. Vitamin D deficiency Terry Harrington is on prescription Vitamin D3, and his last labs done 2 months ago were at goal. 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    Assessment/Plan:  No orders of the defined types were placed in this encounter.   Medications Discontinued During This Encounter  Medication Reason   Semaglutide, 1 MG/DOSE, (OZEMPIC, 1 MG/DOSE,) 4 MG/3ML SOPN Reorder     Meds ordered this encounter   Medications   Semaglutide, 1 MG/DOSE, (OZEMPIC, 1 MG/DOSE,) 4 MG/3ML SOPN    Sig: Inject 1 mg into the skin every 7 (seven) days.    Dispense:  3 mL    Refill:  0     1. Type 2 diabetes mellitus with other specified complication, without long-term current use of insulin (HCC) We will refill Ozempic 1 mg for 1 month. Terry Harrington will continue his other medications per his Endocrinologist. Good blood sugar control is important to decrease the likelihood of diabetic complications such as nephropathy, neuropathy, limb loss, blindness, coronary artery disease, and death. Intensive lifestyle modification including diet, exercise and weight loss are the first line of treatment for diabetes.   - Semaglutide, 1 MG/DOSE, (OZEMPIC, 1 MG/DOSE,) 4 MG/3ML SOPN; Inject 1 mg into the skin every 7 (seven) days.  Dispense: 3 mL; Refill: 0  2. Hypertension associated with type 2 diabetes mellitus ([place]) Terry Harrington will continue Azor, and will continue working on healthy weight loss and exercise to improve blood pressure control. We will watch for signs of hypotension as he continues his lifestyle modifications.  3. Vitamin D deficiency Vit D at goal. Terry Harrington will continue prescription Vitamin D3 50,000 IU every week and will follow-up for routine testing of Vitamin D, at least 2-3 times per year to avoid over-replacement.  4. Obesity with current BMI of 29.7 Terry Harrington is currently in the action stage of change. As such, his goal is to continue with weight loss efforts. He has agreed  to the Category 2 Plan.   Terry Harrington plans to increase gym time to Monday, Wednesday, Friday at least.  - Focus on resistance training 2 days per week as well.  Exercise goals: For substantial health benefits, adults should do at least 150 minutes (2 hours and 30 minutes) a week of moderate-intensity, or 75 minutes (1 hour and 15 minutes) a week of vigorous-intensity aerobic physical activity, or an equivalent combination of moderate- and  vigorous-intensity aerobic activity. Aerobic activity should be performed in episodes of at least 10 minutes, and preferably, it should be spread throughout the week.  Behavioral modification strategies: decreasing eating out and planning for success.  Terry Harrington has agreed to follow-up with our clinic in 2 to 3 weeks. He was informed of the importance of frequent follow-up visits to maximize his success with intensive lifestyle modifications for his multiple health conditions.     Objective:   Blood pressure 122/65, pulse 71, temperature 97.9 F (36.6 C), height 5\' 9"  (1.753 m), weight 200 lb (90.7 kg), SpO2 99 %. Body mass index is 29.53 kg/m.  General: Cooperative, alert, well developed, in no acute distress. HEENT: Conjunctivae and lids unremarkable. Cardiovascular: Regular rhythm.  Lungs: Normal work of breathing. Neurologic: No focal deficits.   Lab Results  Component Value Date   CREATININE 1.11 12/08/2020   BUN 13 12/08/2020   NA 141 12/08/2020   K 4.7 12/08/2020   CL 106 12/08/2020   CO2 20 12/08/2020   Lab Results  Component Value Date   ALT 11 12/08/2020   AST 12 12/08/2020   ALKPHOS 77 12/08/2020   BILITOT 0.2 12/08/2020   Lab Results  Component Value Date   HGBA1C 6.0 (H) 12/08/2020   HGBA1C 5.8 (A) 10/26/2020   HGBA1C 6.2 (A) 07/23/2020   HGBA1C 6.2 (A) 03/19/2020   HGBA1C 6.3 03/02/2020   Lab Results  Component Value Date   INSULIN 6.2 12/08/2020   INSULIN 10.1 08/11/2020   Lab Results  Component Value Date   TSH 0.884 12/08/2020   Lab Results  Component Value Date   CHOL 181 12/08/2020   HDL 63 12/08/2020   LDLCALC 98 12/08/2020   LDLDIRECT 86.0 02/10/2019   TRIG 112 12/08/2020   CHOLHDL 3 03/02/2020   Lab Results  Component Value Date   VD25OH 49.9 12/08/2020   VD25OH 60.5 08/11/2020   VD25OH 40.92 03/02/2020   Lab Results  Component Value Date   WBC 4.5 08/11/2020   HGB 14.6 08/11/2020   HCT 44.3 08/11/2020   MCV 92 08/11/2020    PLT 285.0 02/10/2019   Lab Results  Component Value Date   IRON 60 02/02/2017   FERRITIN 170.0 08/17/2014     Obesity Behavioral Intervention:   Approximately 15 minutes were spent on the discussion below.  ASK: We discussed the diagnosis of obesity with Terry Harrington today and Terry Harrington agreed to give [place] permission to discuss obesity behavioral modification therapy today.  ASSESS: Terry Harrington has the diagnosis of obesity and his BMI today is 29.7. Terry Harrington is in the action stage of change.   ADVISE: Terry Harrington was educated on the multiple health risks of obesity as well as the benefit of weight loss to improve his health. He was advised of the need for long term treatment and the importance of lifestyle modifications to improve his current health and to decrease his risk of future health problems.  AGREE: Multiple dietary modification options and treatment options were discussed and Terry Harrington agreed to follow the recommendations documented in  the above note.  ARRANGE: Terry Harrington was educated on the importance of frequent visits to treat obesity as outlined per CMS and USPSTF guidelines and agreed to schedule his next follow up appointment today.    Attestation Statements:   Reviewed by clinician on day of visit: allergies, medications, problem list, medical history, surgical history, family history, social history, and previous encounter notes.   Wilhemena Durie, am acting as transcriptionist for [employer], DO.  I have reviewed the above documentation for accuracy and completeness, and I agree with the above. Marjory Sneddon, D.O.  The [place] was signed into law in 2016 which includes the topic of electronic health records.  This provides immediate access to information in MyChart.  This includes consultation notes, operative notes, office notes, lab results and pathology reports.  If you have any questions about what you read please let us know at your next visit so  we can discuss your concerns and take corrective action if need be.  We are right here with you.

## 2021-02-15 DIAGNOSIS — N189 Chronic kidney disease, unspecified: Secondary | ICD-10-CM | POA: Diagnosis not present

## 2021-02-15 DIAGNOSIS — N183 Chronic kidney disease, stage 3 unspecified: Secondary | ICD-10-CM | POA: Diagnosis not present

## 2021-02-15 DIAGNOSIS — G250 Essential tremor: Secondary | ICD-10-CM | POA: Diagnosis not present

## 2021-02-15 DIAGNOSIS — D649 Anemia, unspecified: Secondary | ICD-10-CM | POA: Diagnosis not present

## 2021-02-15 DIAGNOSIS — N182 Chronic kidney disease, stage 2 (mild): Secondary | ICD-10-CM | POA: Diagnosis not present

## 2021-03-02 ENCOUNTER — Ambulatory Visit: Payer: Medicare HMO | Admitting: Endocrinology

## 2021-03-02 ENCOUNTER — Other Ambulatory Visit: Payer: Self-pay

## 2021-03-02 ENCOUNTER — Ambulatory Visit (INDEPENDENT_AMBULATORY_CARE_PROVIDER_SITE_OTHER): Payer: Medicare HMO | Admitting: Internal Medicine

## 2021-03-02 ENCOUNTER — Encounter: Payer: Self-pay | Admitting: Internal Medicine

## 2021-03-02 VITALS — BP 110/60 | HR 80 | Ht 69.0 in | Wt 204.4 lb

## 2021-03-02 VITALS — BP 136/74 | HR 80 | Temp 97.9°F | Resp 16 | Ht 69.0 in | Wt 204.0 lb

## 2021-03-02 DIAGNOSIS — Z0000 Encounter for general adult medical examination without abnormal findings: Secondary | ICD-10-CM

## 2021-03-02 DIAGNOSIS — N400 Enlarged prostate without lower urinary tract symptoms: Secondary | ICD-10-CM | POA: Diagnosis not present

## 2021-03-02 DIAGNOSIS — E559 Vitamin D deficiency, unspecified: Secondary | ICD-10-CM

## 2021-03-02 DIAGNOSIS — E118 Type 2 diabetes mellitus with unspecified complications: Secondary | ICD-10-CM

## 2021-03-02 DIAGNOSIS — I10 Essential (primary) hypertension: Secondary | ICD-10-CM

## 2021-03-02 DIAGNOSIS — Z23 Encounter for immunization: Secondary | ICD-10-CM

## 2021-03-02 LAB — VITAMIN D 25 HYDROXY (VIT D DEFICIENCY, FRACTURES): VITD: 72.93 ng/mL (ref 30.00–100.00)

## 2021-03-02 LAB — POCT GLYCOSYLATED HEMOGLOBIN (HGB A1C): Hemoglobin A1C: 6 % — AB (ref 4.0–5.6)

## 2021-03-02 LAB — PSA: PSA: 0.36 ng/mL (ref 0.10–4.00)

## 2021-03-02 MED ORDER — EMPAGLIFLOZIN 25 MG PO TABS: 25.0000 mg | ORAL_TABLET | Freq: Every day | ORAL | 1 refills | Status: DC

## 2021-03-02 MED ORDER — SHINGRIX 50 MCG/0.5ML IM SUSR: 0.5000 mL | Freq: Once | INTRAMUSCULAR | 1 refills | Status: AC

## 2021-03-02 NOTE — Patient Instructions (Addendum)
I have sent a prescription to your pharmacy, to resume the [place].   Please continue the same other 3 diabetes medications.   check your blood sugar once a day.  vary the time of day when you check, between before the 3 meals, and at bedtime.  also check if you have symptoms of your blood sugar being too high or too low.  please keep a record of the readings and bring it to your next appointment here (or you can bring the meter itself).  You can write it on any piece of paper.  please call us sooner if your blood sugar goes below 70, or if you have a lot of readings over 200.   Please come back for a follow-up appointment in May.

## 2021-03-02 NOTE — Progress Notes (Signed)
Subjective:  Patient ID: Terry Harrington, male    DOB: Sep 12, 1955  Age: 66 y.o. MRN: [id number]  CC: Annual Exam and Hypertension  This visit occurred during the SARS-CoV-2 public health emergency.  Safety protocols were in place, including screening questions prior to the visit, additional usage of staff PPE, and extensive cleaning of exam room while observing appropriate contact time as indicated for disinfecting solutions.    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  His endurance is good and he denies fatigue, chest pain, shortness of breath, edema, dizziness, lightheadedness, or palpitations.  Outpatient Medications Prior to Visit  Medication Sig Dispense Refill   amlodipine-olmesartan (AZOR) 10-20 MG tablet TAKE 1 TABLET BY MOUTH EVERY DAY 100 tablet 0   Cholecalciferol (VITAMIN D3) 1.25 MG (50000 UT) CAPS TAKE 1 CAPSULE BY MOUTH ONE TIME PER WEEK 14 capsule 0   fenofibrate (TRICOR) 48 MG tablet Take 1 tablet (48 mg total) by mouth daily. 100 tablet 1   levothyroxine (SYNTHROID) 100 MCG tablet Take 1 tablet (100 mcg total) by mouth daily. 100 tablet 0   loratadine (CLARITIN) 10 MG tablet Take 10 mg by mouth daily.     meclizine (ANTIVERT) 25 MG tablet Take 1 tablet (25 mg total) by mouth 3 (three) times daily as needed for dizziness. 270 tablet 1   metFORMIN (GLUCOPHAGE-XR) 500 MG 24 hr tablet Take 2 tablets (1,000 mg total) by mouth daily with breakfast. 180 tablet 3   Multiple Vitamins-Minerals (PRESERVISION AREDS 2) CAPS Take by mouth.     pioglitazone (ACTOS) 45 MG tablet Take 1 tablet (45 mg total) by mouth daily. 100 tablet 3   pravastatin (PRAVACHOL) 40 MG tablet TAKE 1 TABLET BY MOUTH EVERY DAY 90 tablet 1   Semaglutide, 1 MG/DOSE, (OZEMPIC, 1 MG/DOSE,) 4 MG/3ML SOPN Inject 1 mg into the skin every 7 (seven) days. 3 mL 0   Testosterone 12.5 MG/ACT (1%) GEL Place 1 Pump onto the skin daily. 75 g 2    vitamin B-12 (CYANOCOBALAMIN) 1000 MCG tablet Take 2,000 mcg by mouth daily.     No facility-administered medications prior to visit.    ROS Review of Systems  Constitutional: Negative.   HENT: Negative.    Eyes: Negative.   Respiratory:  Negative for cough and shortness of breath.   Cardiovascular:  Negative for chest pain, palpitations and leg swelling.  Gastrointestinal:  Negative for abdominal pain, constipation, diarrhea, nausea and vomiting.  Endocrine: Negative.   Genitourinary: Negative.  Negative for difficulty urinating.  Musculoskeletal:  Negative for arthralgias and myalgias.  Skin: Negative.   Neurological:  Negative for dizziness, weakness and light-headedness.  Hematological:  Negative for adenopathy. Does not bruise/bleed easily.  Psychiatric/Behavioral: Negative.     Objective:  BP 136/74 (BP Location: Left Arm, Patient Position: Sitting, Cuff Size: Large)    Pulse 80    Temp 97.9 F (36.6 C) (Oral)    Resp 16    Ht 5\' 9"  (1.753 m)    Wt 204 lb (92.5 kg)    SpO2 97%    BMI 30.13 kg/m   BP Readings from Last 3 Encounters:  03/02/21 110/60  03/02/21 136/74  02/08/21 122/65    Wt Readings from Last 3 Encounters:  03/02/21 204 lb 6.4 oz (92.7 kg)  03/02/21 204 lb (92.5 kg)  02/08/21 200 lb (90.7 kg)    Physical Exam Vitals reviewed.  Constitutional:      Appearance: Normal appearance.  HENT:     Nose: Nose normal.     Mouth/Throat:     Mouth: Mucous membranes are moist.  Eyes:     General: No scleral icterus.    Conjunctiva/sclera: Conjunctivae normal.  Cardiovascular:     Rate and Rhythm: Normal rate and regular rhythm.     Heart sounds: No murmur heard. Pulmonary:     Effort: Pulmonary effort is normal.     Breath sounds: No stridor. No wheezing, rhonchi or rales.  Abdominal:     General: Abdomen is flat.     Palpations: There is no mass.     Tenderness: There is no abdominal tenderness. There is no guarding.     Hernia: No hernia is present.   Musculoskeletal:        General: Normal range of motion.     Cervical back: Neck supple.     Right lower leg: No edema.     Left lower leg: No edema.  Lymphadenopathy:     Cervical: No cervical adenopathy.  Skin:    General: Skin is warm and dry.     Coloration: Skin is not pale.  Neurological:     General: No focal deficit present.     Mental Status: He is alert. Mental status is at baseline.    Lab Results  Component Value Date   WBC 4.5 08/11/2020   HGB 14.6 08/11/2020   HCT 44.3 08/11/2020   PLT 285.0 02/10/2019   GLUCOSE 98 12/08/2020   CHOL 181 12/08/2020   TRIG 112 12/08/2020   HDL 63 12/08/2020   LDLDIRECT 86.0 02/10/2019   LDLCALC 98 12/08/2020   ALT 11 12/08/2020   AST 12 12/08/2020   NA 141 12/08/2020   K 4.7 12/08/2020   CL 106 12/08/2020   CREATININE 1.11 12/08/2020   BUN 13 12/08/2020   CO2 20 12/08/2020   TSH 0.884 12/08/2020   PSA 0.36 03/02/2021   HGBA1C 6.0 (A) 03/02/2021   MICROALBUR <0.7 03/02/2020    DG HIP UNILAT WITH PELVIS 2-3 VIEWS LEFT  Result Date: 05/13/2019 CLINICAL DATA:  Pain EXAM: DG HIP (WITH OR WITHOUT PELVIS) 2-3V LEFT COMPARISON:  None. FINDINGS: Frontal pelvis as well as weightbearing frontal and supine lateral left hip images obtained. There is no appreciable fracture or dislocation. There is mild symmetric narrowing of each hip joint. No erosive change. Sacroiliac joints appear unremarkable bilaterally. There is lower lumbar levoscoliosis. IMPRESSION: Symmetric narrowing hip joints. No fracture or dislocation. Lower lumbar levoscoliosis. Electronically Signed   By: Lowella Grip III M.D.   On: 05/13/2019 08:32    Assessment & Plan:   Caynan was seen today for annual exam and hypertension.  Diagnoses and all orders for this visit:  Routine general medical examination at a health care facility- Exam completed, labs reviewed, vaccines reviewed and updated, cancer screenings are up-to-date, patient education was  given.  Primary hypertension- His blood pressure is well controlled.  Benign prostatic hyperplasia without lower urinary tract symptoms- His PSA is reassuring.  His symptoms are adequately well controlled. -     PSA; Future -     PSA  Vitamin D deficiency disease- His Vit D is normal now. -     VITAMIN D 25 Hydroxy (Vit-D Deficiency, Fractures); Future -     VITAMIN D 25 Hydroxy (Vit-D Deficiency, Fractures)  Need for shingles vaccine -     Zoster Vaccine Adjuvanted [facility]) injection; Inject  0.5 mLs into the muscle once for 1 dose.   I am having [name] start on Shingrix. I am also having him maintain his vitamin B-12, loratadine, PreserVision AREDS 2, pravastatin, pioglitazone, levothyroxine, meclizine, fenofibrate, metFORMIN, Testosterone, Vitamin D3, amlodipine-olmesartan, and Ozempic (1 MG/DOSE).  Meds ordered this encounter  Medications   Zoster Vaccine Adjuvanted [facility]) injection    Sig: Inject 0.5 mLs into the muscle once for 1 dose.    Dispense:  0.5 mL    Refill:  1     Follow-up: Return in about 6 months (around 08/30/2021).  Scarlette Calico, MD

## 2021-03-02 NOTE — Patient Instructions (Signed)

## 2021-03-02 NOTE — Progress Notes (Signed)
Subjective:    Patient ID: Terry Harrington, male    DOB: [birth year], 66 y.o.   MRN: [id number]  HPI Pt returns for f/u of diabetes mellitus:  DM type: 2 Dx'ed: [id number] Complications: stage 3 CRI Therapy: Ozempic and 3 oral meds.  DKA: never.  Severe hypoglycemia: never.  Pancreatitis: never Other: he took insulin 2015-2017; in 2017, he was hospitalized for HONK, due to missing insulin doses, he did not tolerate Farxiga (arthralgias); he works M-F, day shift.  Interval history: no cbg record, but states cbg's vary from 84-159  Jardiance was d/c'ed at [facility].  He takes other meds as rx'ed.   Pt also has primary hypogonadism (uncertain etiology; dx'ed 2018; he has lifelong infertility, but karyotype was normal; he took androgel 2012-2014; testosterone level was low, but he is on max safe dosage (50 mg/d)).  He takes the androgel as rx'ed.   Past Medical History:  Diagnosis Date   B12 deficiency    Back pain    Diabetes mellitus    Drug use    Food allergy    GERD (gastroesophageal reflux disease)    Heart murmur    Hyperlipidemia    Hypertension    on meds   Hypothyroidism    on meds   Joint pain    Lactose intolerance    Sleep apnea    SOB (shortness of breath)    Vitamin D deficiency     Past Surgical History:  Procedure Laterality Date   DENTAL SURGERY     several dental procedure approx 10 between 1999-2012   [place]     as a child   SHOULDER ARTHROSCOPY  05/31/2011   Procedure: ARTHROSCOPY SHOULDER;  Surgeon: Sharmon Revere, MD;  Location: [facility];  Service: Orthopedics;  Laterality: Left;  Shoulder Acromioplasty Left   SHOULDER SURGERY  05/31/2011    impingment left    Social History   Socioeconomic History   Marital status: Divorced    Spouse name: Not on file   Number of children: Not on file   Years of education: Not on file   Highest education level: Not on file  Occupational History   Occupation: Retired  Tobacco Use   Smoking status: Former     Packs/day: 1.50    Years: 26.00    Pack years: 39.00    Types: Cigarettes    Quit date: 03/02/2008    Years since quitting: 13.0   Smokeless tobacco: Former    Quit date: 05/25/2008  Vaping Use   Vaping Use: Never used  Substance and Sexual Activity   Alcohol use: Yes    Alcohol/week: 0.0 standard drinks    Comment: rarely   Drug use: No   Sexual activity: Not Currently  Other Topics Concern   Not on file  Social History Narrative   Not on file   Social Determinants of Health   Financial Resource Strain: Not on file  Food Insecurity: Not on file  Transportation Needs: Not on file  Physical Activity: Not on file  Stress: Not on file  Social Connections: Not on file  Intimate Partner Violence: Not on file    Current Outpatient Medications on File Prior to Visit  Medication Sig Dispense Refill   amlodipine-olmesartan (AZOR) 10-20 MG tablet TAKE 1 TABLET BY MOUTH EVERY DAY 100 tablet 0   Cholecalciferol (VITAMIN D3) 1.25 MG (50000 UT) CAPS TAKE 1 CAPSULE BY MOUTH ONE TIME PER WEEK 14 capsule 0   fenofibrate (TRICOR)  48 MG tablet Take 1 tablet (48 mg total) by mouth daily. 100 tablet 1   levothyroxine (SYNTHROID) 100 MCG tablet Take 1 tablet (100 mcg total) by mouth daily. 100 tablet 0   loratadine (CLARITIN) 10 MG tablet Take 10 mg by mouth daily.     meclizine (ANTIVERT) 25 MG tablet Take 1 tablet (25 mg total) by mouth 3 (three) times daily as needed for dizziness. 270 tablet 1   metFORMIN (GLUCOPHAGE-XR) 500 MG 24 hr tablet Take 2 tablets (1,000 mg total) by mouth daily with breakfast. 180 tablet 3   Multiple Vitamins-Minerals (PRESERVISION AREDS 2) CAPS Take by mouth.     pioglitazone (ACTOS) 45 MG tablet Take 1 tablet (45 mg total) by mouth daily. 100 tablet 3   pravastatin (PRAVACHOL) 40 MG tablet TAKE 1 TABLET BY MOUTH EVERY DAY 90 tablet 1   Semaglutide, 1 MG/DOSE, (OZEMPIC, 1 MG/DOSE,) 4 MG/3ML SOPN Inject 1 mg into the skin every 7 (seven) days. 3 mL 0   Testosterone  12.5 MG/ACT (1%) GEL Place 1 Pump onto the skin daily. 75 g 2   vitamin B-12 (CYANOCOBALAMIN) 1000 MCG tablet Take 2,000 mcg by mouth daily.     No current facility-administered medications on file prior to visit.    Allergies  Allergen Reactions   Crab [Shellfish Allergy] Anaphylaxis   Iodine Anaphylaxis   Lisinopril     cough   Crestor [Rosuvastatin]     Muscle aches   Farxiga [Dapagliflozin] Other (See Comments)    Joint pain and weakness   Iohexol Swelling   Peanut-Containing Drug Products Itching    And whelps   Aspirin Rash   Codeine Rash   Penicillins Rash    Has patient had a PCN reaction causing immediate rash, facial/tongue/throat swelling, SOB or lightheadedness with hypotension: Yes Has patient had a PCN reaction causing severe rash involving mucus membranes or skin necrosis: Yes Has patient had a PCN reaction that required hospitalization No Has patient had a PCN reaction occurring within the last 10 years: No If all of the above answers are "NO", then may proceed with Cephalosporin use.     Family History  Problem Relation Age of Onset   Stroke Mother    Diabetes Mother    Hyperlipidemia Mother    Hypertension Mother    Heart disease Mother    Thyroid disease Mother    Heart disease Father    Hyperlipidemia Father    Hypertension Father    Kidney disease Father    Diabetes Father    Cancer Maternal Grandfather    Anesthesia problems Neg Hx    Hypotension Neg Hx    Malignant hyperthermia Neg Hx    Pseudochol deficiency Neg Hx     BP 110/60    Pulse 80    Ht 5\' 9"  (1.753 m)    Wt 204 lb 6.4 oz (92.7 kg)    SpO2 97%    BMI 30.18 kg/m    Review of Systems Denies arthralgias    Objective:   Physical Exam    Lab Results  Component Value Date   HGBA1C 6.0 (A) 03/02/2021      Assessment & Plan:  Type 2 DM: well-controlled Obesity: uncontrolled   Patient Instructions  I have sent a prescription to your pharmacy, to resume the Jardiance.    Please continue the same other 3 diabetes medications.   check your blood sugar once a day.  vary the time of day when you check,  between before the 3 meals, and at bedtime.  also check if you have symptoms of your blood sugar being too high or too low.  please keep a record of the readings and bring it to your next appointment here (or you can bring the meter itself).  You can write it on any piece of paper.  please call us sooner if your blood sugar goes below 70, or if you have a lot of readings over 200.   Please come back for a follow-up appointment in May.

## 2021-03-07 ENCOUNTER — Encounter (INDEPENDENT_AMBULATORY_CARE_PROVIDER_SITE_OTHER): Payer: Self-pay | Admitting: Family Medicine

## 2021-03-07 ENCOUNTER — Other Ambulatory Visit: Payer: Self-pay

## 2021-03-07 ENCOUNTER — Ambulatory Visit (INDEPENDENT_AMBULATORY_CARE_PROVIDER_SITE_OTHER): Payer: Medicare HMO | Admitting: Family Medicine

## 2021-03-07 VITALS — BP 108/63 | HR 64 | Temp 98.0°F | Ht 69.0 in | Wt 200.0 lb

## 2021-03-07 DIAGNOSIS — Z6829 Body mass index (BMI) 29.0-29.9, adult: Secondary | ICD-10-CM | POA: Diagnosis not present

## 2021-03-07 DIAGNOSIS — I10 Essential (primary) hypertension: Secondary | ICD-10-CM | POA: Diagnosis not present

## 2021-03-07 DIAGNOSIS — E1169 Type 2 diabetes mellitus with other specified complication: Secondary | ICD-10-CM

## 2021-03-07 DIAGNOSIS — Z7985 Long-term (current) use of injectable non-insulin antidiabetic drugs: Secondary | ICD-10-CM | POA: Diagnosis not present

## 2021-03-07 DIAGNOSIS — E669 Obesity, unspecified: Secondary | ICD-10-CM

## 2021-03-07 MED ORDER — OZEMPIC (1 MG/DOSE) 4 MG/3ML ~~LOC~~ SOPN: 1.0000 mg | PEN_INJECTOR | SUBCUTANEOUS | 0 refills | Status: DC

## 2021-03-08 NOTE — Progress Notes (Signed)
Chief Complaint:   OBESITY Tatsumi is here to discuss his progress with his obesity treatment plan along with follow-up of his obesity related diagnoses. Achyuth is on the Category 2 Plan and states he is following his eating plan approximately 85% of the time. Korrey states he is biking for 60 minutes 3 times per week.  Today's visit was #: 12 Starting weight: 207 lbs Starting date: 08/11/2020 Today's weight: 200 lbs Today's date: 03/07/2021 Total lbs lost to date: 7 Total lbs lost since last in-office visit: 0  Interim History: Jaion has done well maintaining his weight since his last visit. He is being mindful of his food intake, but meal planning has been challenging.  Subjective:   1. Type 2 diabetes mellitus with other specified complication, without long-term current use of insulin ([place]) Angello is doing well on Ozempic. Last A1c was well controlled at 6.0. No side effects noted, and no nausea or vomiting.  2. Essential hypertension Alan's blood pressure is very well controlled on his medications and with diet and exercise. No signs of hypotension with weight loss.  Assessment/Plan:   1. Type 2 diabetes mellitus with other specified complication, without long-term current use of insulin ([facility]) We will refill Ozempic for 1 month. Ion was encouraged to continue to increase his water intake to help minimize side effects of Jardiance. Good blood sugar control is important to decrease the likelihood of diabetic complications such as nephropathy, neuropathy, limb loss, blindness, coronary artery disease, and death. Intensive lifestyle modification including diet, exercise and weight loss are the first line of treatment for diabetes.   - Semaglutide, 1 MG/DOSE, (OZEMPIC, 1 MG/DOSE,) 4 MG/3ML SOPN; Inject 1 mg into the skin every 7 (seven) days.  Dispense: 3 mL; Refill: 0  2. Essential hypertension Pharis will continue with diet and exercise, and increase his water  intake. We will follow as he continues his lifestyle modifications.  3. Obesity with current BMI of 29.7 Rayner is currently in the action stage of change. As such, his goal is to continue with weight loss efforts. He has agreed to the Category 2 Plan.   Blayk is to work on meal planning/grocery shopping to help get back to weight loss.  Exercise goals: As is.  Behavioral modification strategies: increasing lean protein intake and meal planning and cooking strategies.  Artavis has agreed to follow-up with our clinic in 3 to 4 weeks. He was informed of the importance of frequent follow-up visits to maximize his success with intensive lifestyle modifications for his multiple health conditions.   Objective:   Blood pressure 108/63, pulse 64, temperature 98 F (36.7 C), height 5\' 9"  (1.753 m), weight 200 lb (90.7 kg), SpO2 98 %. Body mass index is 29.53 kg/m.  General: Cooperative, alert, well developed, in no acute distress. HEENT: Conjunctivae and lids unremarkable. Cardiovascular: Regular rhythm.  Lungs: Normal work of breathing. Neurologic: No focal deficits.   Lab Results  Component Value Date   CREATININE 1.11 12/08/2020   BUN 13 12/08/2020   NA 141 12/08/2020   K 4.7 12/08/2020   CL 106 12/08/2020   CO2 20 12/08/2020   Lab Results  Component Value Date   ALT 11 12/08/2020   AST 12 12/08/2020   ALKPHOS 77 12/08/2020   BILITOT 0.2 12/08/2020   Lab Results  Component Value Date   HGBA1C 6.0 (A) 03/02/2021   HGBA1C 6.0 (H) 12/08/2020   HGBA1C 5.8 (A) 10/26/2020   HGBA1C 6.2 (A) 07/23/2020  HGBA1C 6.2 (A) 03/19/2020   Lab Results  Component Value Date   INSULIN 6.2 12/08/2020   INSULIN 10.1 08/11/2020   Lab Results  Component Value Date   TSH 0.884 12/08/2020   Lab Results  Component Value Date   CHOL 181 12/08/2020   HDL 63 12/08/2020   LDLCALC 98 12/08/2020   LDLDIRECT 86.0 02/10/2019   TRIG 112 12/08/2020   CHOLHDL 3 03/02/2020   Lab Results   Component Value Date   VD25OH 72.93 03/02/2021   VD25OH 49.9 12/08/2020   VD25OH 60.5 08/11/2020   Lab Results  Component Value Date   WBC 4.5 08/11/2020   HGB 14.6 08/11/2020   HCT 44.3 08/11/2020   MCV 92 08/11/2020   PLT 285.0 02/10/2019   Lab Results  Component Value Date   IRON 60 02/02/2017   FERRITIN 170.0 08/17/2014    Obesity Behavioral Intervention:   Approximately 15 minutes were spent on the discussion below.  ASK: We discussed the diagnosis of obesity with [name] today and Kieran agreed to give [place] permission to discuss obesity behavioral modification therapy today.  ASSESS: Amerion has the diagnosis of obesity and his BMI today is 29.7. Jakorian is in the action stage of change.   ADVISE: Eivin was educated on the multiple health risks of obesity as well as the benefit of weight loss to improve his health. He was advised of the need for long term treatment and the importance of lifestyle modifications to improve his current health and to decrease his risk of future health problems.  AGREE: Multiple dietary modification options and treatment options were discussed and Esias agreed to follow the recommendations documented in the above note.  ARRANGE: Abhi was educated on the importance of frequent visits to treat obesity as outlined per CMS and USPSTF guidelines and agreed to schedule his next follow up appointment today.  Attestation Statements:   Reviewed by clinician on day of visit: allergies, medications, problem list, medical history, surgical history, family history, social history, and previous encounter notes.   I, Trixie Dredge, am acting as transcriptionist for Dennard Nip, MD.  I have reviewed the above documentation for accuracy and completeness, and I agree with the above. -  Dennard Nip, MD

## 2021-03-11 ENCOUNTER — Other Ambulatory Visit: Payer: Self-pay | Admitting: Internal Medicine

## 2021-03-11 ENCOUNTER — Other Ambulatory Visit (HOSPITAL_BASED_OUTPATIENT_CLINIC_OR_DEPARTMENT_OTHER): Payer: Self-pay

## 2021-03-11 DIAGNOSIS — E785 Hyperlipidemia, unspecified: Secondary | ICD-10-CM

## 2021-03-31 ENCOUNTER — Encounter (INDEPENDENT_AMBULATORY_CARE_PROVIDER_SITE_OTHER): Payer: Self-pay | Admitting: Family Medicine

## 2021-03-31 ENCOUNTER — Other Ambulatory Visit: Payer: Self-pay

## 2021-03-31 ENCOUNTER — Ambulatory Visit (INDEPENDENT_AMBULATORY_CARE_PROVIDER_SITE_OTHER): Payer: Medicare HMO | Admitting: Family Medicine

## 2021-03-31 VITALS — BP 120/74 | HR 70 | Temp 98.2°F | Ht 69.0 in | Wt 203.0 lb

## 2021-03-31 DIAGNOSIS — E669 Obesity, unspecified: Secondary | ICD-10-CM

## 2021-03-31 DIAGNOSIS — E559 Vitamin D deficiency, unspecified: Secondary | ICD-10-CM

## 2021-03-31 DIAGNOSIS — Z7985 Long-term (current) use of injectable non-insulin antidiabetic drugs: Secondary | ICD-10-CM | POA: Diagnosis not present

## 2021-03-31 DIAGNOSIS — Z6830 Body mass index (BMI) 30.0-30.9, adult: Secondary | ICD-10-CM

## 2021-03-31 DIAGNOSIS — E1169 Type 2 diabetes mellitus with other specified complication: Secondary | ICD-10-CM | POA: Diagnosis not present

## 2021-03-31 MED ORDER — OZEMPIC (1 MG/DOSE) 4 MG/3ML ~~LOC~~ SOPN: 2.0000 mg | PEN_INJECTOR | SUBCUTANEOUS | 0 refills | Status: DC

## 2021-04-04 NOTE — Progress Notes (Signed)
? ? ? ?Chief Complaint:  ? ?OBESITY ?Terry Harrington is here to discuss his progress with his obesity treatment plan along with follow-up of his obesity related diagnoses. Terry Harrington is on the Category 2 Plan and states he is following his eating plan approximately 65% of the time. Terry Harrington states he is biking and doing resistance for 60 minutes 3 times per week. ? ?Today's visit was #: 13 ?Starting weight: 207 lbs ?Starting date: 08/11/2020 ?Today's weight: 203 lbs ?Today's date: 03/31/2021 ?Total lbs lost to date: 4 ?Total lbs lost since last in-office visit: 0 ? ?Interim History: Terry Harrington increased traveling and he is struggling a bit more on his plan. He feels he will be able to get back on track, but he notes an increase in sugar cravings, which is unusual for him. ? ?Subjective:  ? ?1. Type 2 diabetes mellitus with other specified complication, without long-term current use of insulin (Terry Harrington) ?Terry Harrington is working on his diet and exercise, but he notes PM polyphagia has increased. ? ?2. Vitamin D deficiency ?Terry Harrington is stable on Vit D, with no side effects noted. ? ?Assessment/Plan:  ? ?1. Type 2 diabetes mellitus with other specified complication, without long-term current use of insulin (Terry Harrington) ?Terry Harrington is to increase Terry Harrington to 2 mg q week, and we will refill for 90 days. (He is to increase to 1.5 mg or 54 clicks first). We will recheck labs in 1 month. He will continue his other medications as is. ? ?- Semaglutide, 1 MG/DOSE, (Terry Harrington, 1 MG/DOSE,) 4 MG/3ML SOPN; Inject 2 mg into the skin every 7 (seven) days.  Dispense: 18 mL; Refill: 0 ? ?2. Vitamin D deficiency ?Terry Harrington will continue Vitamin D prescription, and he will  recheck labs in 1 months. ? ?3. Obesity with current BMI of 30.1 ?Terry Harrington is currently in the action stage of change. As such, his goal is to continue with weight loss efforts. He has agreed to the Category 2 Plan.  ? ?Exercise goals: As is. ? ?Behavioral modification strategies: no skipping meals and meal  planning and cooking strategies. ? ?Terry Harrington has agreed to follow-up with our clinic virtual in 3 weeks. He was informed of the importance of frequent follow-up visits to maximize his success with intensive lifestyle modifications for his multiple health conditions.  ? ?Objective:  ? ?Blood pressure 120/74, pulse 70, temperature 98.2 ?F (36.8 ?C), height 5\' 9"  (1.753 m), weight 203 lb (92.1 kg), SpO2 100 %. ?Body mass index is 29.98 kg/m?. ? ?General: Cooperative, alert, well developed, in no acute distress. ?HEENT: Conjunctivae and lids unremarkable. ?Cardiovascular: Regular rhythm.  ?Lungs: Normal work of breathing. ?Neurologic: No focal deficits.  ? ?Lab Results  ?Component Value Date  ? CREATININE 1.11 12/08/2020  ? BUN 13 12/08/2020  ? NA 141 12/08/2020  ? K 4.7 12/08/2020  ? CL 106 12/08/2020  ? CO2 20 12/08/2020  ? ?Lab Results  ?Component Value Date  ? ALT 11 12/08/2020  ? AST 12 12/08/2020  ? ALKPHOS 77 12/08/2020  ? BILITOT 0.2 12/08/2020  ? ?Lab Results  ?Component Value Date  ? HGBA1C 6.0 (A) 03/02/2021  ? HGBA1C 6.0 (H) 12/08/2020  ? HGBA1C 5.8 (A) 10/26/2020  ? HGBA1C 6.2 (A) 07/23/2020  ? HGBA1C 6.2 (A) 03/19/2020  ? ?Lab Results  ?Component Value Date  ? INSULIN 6.2 12/08/2020  ? INSULIN 10.1 08/11/2020  ? ?Lab Results  ?Component Value Date  ? TSH 0.884 12/08/2020  ? ?Lab Results  ?Component Value Date  ? CHOL 181 12/08/2020  ?  HDL 63 12/08/2020  ? [place] 98 12/08/2020  ? LDLDIRECT 86.0 02/10/2019  ? TRIG 112 12/08/2020  ? CHOLHDL 3 03/02/2020  ? ?Lab Results  ?Component Value Date  ? VD25OH 72.93 03/02/2021  ? VD25OH 49.9 12/08/2020  ? VD25OH 60.5 08/11/2020  ? ?Lab Results  ?Component Value Date  ? WBC 4.5 08/11/2020  ? HGB 14.6 08/11/2020  ? HCT 44.3 08/11/2020  ? MCV 92 08/11/2020  ? PLT 285.0 02/10/2019  ? ?Lab Results  ?Component Value Date  ? IRON 60 02/02/2017  ? FERRITIN 170.0 08/17/2014  ? ? ?Obesity Behavioral Intervention:  ? ?Approximately 15 minutes were spent on the discussion  below. ? ?ASK: ?We discussed the diagnosis of obesity with Terry Harrington today and Terry Harrington agreed to give [place] permission to discuss obesity behavioral modification therapy today. ? ?ASSESS: ?Terry Harrington has the diagnosis of obesity and his BMI today is 30.1. Terry Harrington is in the action stage of change.  ? ?ADVISE: ?Terry Harrington was educated on the multiple health risks of obesity as well as the benefit of weight loss to improve his health. He was advised of the need for long term treatment and the importance of lifestyle modifications to improve his current health and to decrease his risk of future health problems. ? ?AGREE: ?Multiple dietary modification options and treatment options were discussed and Terry Harrington agreed to follow the recommendations documented in the above note. ? ?ARRANGE: ?Terry Harrington was educated on the importance of frequent visits to treat obesity as outlined per CMS and USPSTF guidelines and agreed to schedule his next follow up appointment today. ? ?Attestation Statements:  ? ?Reviewed by clinician on day of visit: allergies, medications, problem list, medical history, surgical history, family history, social history, and previous encounter notes. ? ? ?I, Trixie Dredge, am acting as transcriptionist for Dennard Nip, MD. ? ?I have reviewed the above documentation for accuracy and completeness, and I agree with the above. -  Dennard Nip, MD ? ? ?

## 2021-04-17 NOTE — Progress Notes (Signed)
?TeleHealth Visit:  ?Due to the COVID-19 pandemic, this visit was completed with telemedicine (audio/video) technology to reduce patient and provider exposure as well as to preserve personal protective equipment.  ? ?Terry Harrington has verbally consented to this TeleHealth visit. The patient is located at home, the provider is located at home. The participants in this visit include the listed provider and patient. The visit was conducted today via phone call. Patient was unable to connect to video. Length of call was 19 minutes. ? ? ?OBESITY ?Terry Harrington is here to discuss his progress with his obesity treatment plan along with follow-up of his obesity related diagnoses.  ? ?Today's visit was # 14 ?Starting weight: 207 lbs ?Starting date: 08/11/21 ?Total weight loss: 4 lbs at last in office visit (03/31/21) ?Weight at last in office visit: 203 lbs on 03/31/21 ?Today's reported weight:  No weight reported. ? ? ?Nutrition Plan: the Category 2 Plan 90% of time. ?Hunger is well controlled. Cravings are poorly controlled. Craving sweets recently which is unusual for him.  He says he usually just toughs it out. ?Current exercise:  Gym - exercise bike and weight machines for 60 min 3 x week. ? ?Interim History: Mr. Terry Harrington reports that it is hard for him to eat all of the prescribed protein.  He feels that the hardest part about adhering to the plan is staying away from starches.  He says he only has starches every few months.  He reports that his extra calories are less than 200/day.  He drinks adequate water. ?He is moving to [place] in July and getting married in September of this year. ? ?Assessment/Plan:  ?1. Type II Diabetes ?HgbA1c is at goal. Managed by Dr. Loanne Drilling. Sees every 3-4 months.  Reports increased dose of Ozempic has really helped with his appetite. ?CBGs: Fasting 110-115, 2 hour PP 140-150. Nights- 90-115  ?Any episodes of hypoglycemia? Rarely- about once every 3 months ?Medication(s): Ozempic 1 mg weekly,  metformin 1000 mg daily, Actos 45 mg daily, Jardiance 25 mg daily. ? ?Lab Results  ?Component Value Date  ? HGBA1C 6.0 (A) 03/02/2021  ? HGBA1C 6.0 (H) 12/08/2020  ? HGBA1C 5.8 (A) 10/26/2020  ? ?Lab Results  ?Component Value Date  ? MICROALBUR <0.7 03/02/2020  ? [place] 98 12/08/2020  ? CREATININE 1.11 12/08/2020  ? ? ?Plan: ?Continue all medications at current dose. ?Follow-up with Dr. Loanne Drilling as directed. ? ?2. Hyperlipidemia associated with type 2 diabetes. ?LDL is at goal.  Triglycerides have improved significantly since 2021 when triglycerides were as high as 304. ?Medication(s): Pravastatin and fenofibrate. ?Denies chest pain or shortness of breath. ?Cardiovascular risk factors: advanced age (older than 17 for men, 35 for women), diabetes mellitus, dyslipidemia, hypertension, male gender, and obesity (BMI >= 30 kg/m2) ? ?Lab Results  ?Component Value Date  ? CHOL 181 12/08/2020  ? HDL 63 12/08/2020  ? [place] 98 12/08/2020  ? LDLDIRECT 86.0 02/10/2019  ? TRIG 112 12/08/2020  ? CHOLHDL 3 03/02/2020  ? ?Lab Results  ?Component Value Date  ? ALT 11 12/08/2020  ? AST 12 12/08/2020  ? ALKPHOS 77 12/08/2020  ? BILITOT 0.2 12/08/2020  ? ?The 10-year ASCVD risk score (Arnett DK, et al., 2019) is: 39.2% ?  Values used to calculate the score: ?    Age: 29 years ?    Sex: Male ?    Is Non-Hispanic African American: Yes ?    Diabetic: Yes ?    Tobacco smoker: Yes ?    Systolic  Blood Pressure: 120 mmHg ?    Is BP treated: Yes ?    HDL Cholesterol: 63 mg/dL ?    Total Cholesterol: 181 mg/dL ? ?Plan: ?Continue statin and fenofibrate. ? ?3. Obesity: Current BMI 29.96 ?Terry Harrington is currently in the action stage of change. As such, his goal is to continue with weight loss efforts.  ?He has agreed to the Category 2 Plan.  ? ?Exercise goals:  Continue current regimen. ? ?Behavioral modification strategies: increasing lean protein intake, decreasing simple carbohydrates, and meal planning and cooking strategies. ? ?Terry Harrington has  agreed to follow-up with our clinic in 3 weeks.  ? ?No orders of the defined types were placed in this encounter. ? ? ?There are no discontinued medications.  ? ?No orders of the defined types were placed in this encounter. ?   ? ?Objective:  ? ?VITALS: Per patient if applicable, see vitals. ?GENERAL: Alert and in no acute distress. ?CARDIOPULMONARY: No increased WOB. Speaking in clear sentences.  ?PSYCH: Pleasant and cooperative. Speech normal rate and rhythm. Affect is appropriate. Insight and judgement are appropriate. Attention is focused, linear, and appropriate.  ?NEURO: Oriented as arrived to appointment on time with no prompting.  ? ?Lab Results  ?Component Value Date  ? CREATININE 1.11 12/08/2020  ? BUN 13 12/08/2020  ? NA 141 12/08/2020  ? K 4.7 12/08/2020  ? CL 106 12/08/2020  ? CO2 20 12/08/2020  ? ?Lab Results  ?Component Value Date  ? ALT 11 12/08/2020  ? AST 12 12/08/2020  ? ALKPHOS 77 12/08/2020  ? BILITOT 0.2 12/08/2020  ? ?Lab Results  ?Component Value Date  ? HGBA1C 6.0 (A) 03/02/2021  ? HGBA1C 6.0 (H) 12/08/2020  ? HGBA1C 5.8 (A) 10/26/2020  ? HGBA1C 6.2 (A) 07/23/2020  ? HGBA1C 6.2 (A) 03/19/2020  ? ?Lab Results  ?Component Value Date  ? INSULIN 6.2 12/08/2020  ? INSULIN 10.1 08/11/2020  ? ?Lab Results  ?Component Value Date  ? TSH 0.884 12/08/2020  ? ?Lab Results  ?Component Value Date  ? CHOL 181 12/08/2020  ? HDL 63 12/08/2020  ? Terry Harrington 98 12/08/2020  ? LDLDIRECT 86.0 02/10/2019  ? TRIG 112 12/08/2020  ? CHOLHDL 3 03/02/2020  ? ?Lab Results  ?Component Value Date  ? WBC 4.5 08/11/2020  ? HGB 14.6 08/11/2020  ? HCT 44.3 08/11/2020  ? MCV 92 08/11/2020  ? PLT 285.0 02/10/2019  ? ?Lab Results  ?Component Value Date  ? IRON 60 02/02/2017  ? FERRITIN 170.0 08/17/2014  ? ?Lab Results  ?Component Value Date  ? VD25OH 72.93 03/02/2021  ? VD25OH 49.9 12/08/2020  ? VD25OH 60.5 08/11/2020  ? ? ?Attestation Statements:  ? ?Reviewed by clinician on day of visit: allergies, medications, problem list,  medical history, surgical history, family history, social history, and previous encounter notes. ? ?Time spent on visit including pre-visit chart review and post-visit charting and care was 34 minutes.  ? ? ?

## 2021-04-18 ENCOUNTER — Encounter (INDEPENDENT_AMBULATORY_CARE_PROVIDER_SITE_OTHER): Payer: Self-pay | Admitting: Family Medicine

## 2021-04-18 ENCOUNTER — Telehealth (INDEPENDENT_AMBULATORY_CARE_PROVIDER_SITE_OTHER): Payer: Medicare HMO | Admitting: Family Medicine

## 2021-04-18 DIAGNOSIS — E1169 Type 2 diabetes mellitus with other specified complication: Secondary | ICD-10-CM

## 2021-04-18 DIAGNOSIS — Z7985 Long-term (current) use of injectable non-insulin antidiabetic drugs: Secondary | ICD-10-CM

## 2021-04-18 DIAGNOSIS — E669 Obesity, unspecified: Secondary | ICD-10-CM | POA: Diagnosis not present

## 2021-04-18 DIAGNOSIS — E785 Hyperlipidemia, unspecified: Secondary | ICD-10-CM | POA: Diagnosis not present

## 2021-04-18 DIAGNOSIS — Z6829 Body mass index (BMI) 29.0-29.9, adult: Secondary | ICD-10-CM

## 2021-04-20 ENCOUNTER — Other Ambulatory Visit: Payer: Self-pay | Admitting: Internal Medicine

## 2021-04-20 DIAGNOSIS — I10 Essential (primary) hypertension: Secondary | ICD-10-CM

## 2021-04-20 DIAGNOSIS — E118 Type 2 diabetes mellitus with unspecified complications: Secondary | ICD-10-CM

## 2021-04-20 DIAGNOSIS — N1831 Chronic kidney disease, stage 3a: Secondary | ICD-10-CM

## 2021-04-25 ENCOUNTER — Ambulatory Visit (INDEPENDENT_AMBULATORY_CARE_PROVIDER_SITE_OTHER): Payer: Medicare HMO

## 2021-04-25 DIAGNOSIS — Z0000 Encounter for general adult medical examination without abnormal findings: Secondary | ICD-10-CM

## 2021-04-25 NOTE — Patient Instructions (Signed)
Mr. Terry Harrington , ?Thank you for taking time to come for your Medicare Wellness Visit. I appreciate your ongoing commitment to your health goals. Please review the following plan we discussed and let me know if I can assist you in the future.  ? ?Screening recommendations/referrals: ?Colonoscopy: 05/22/2014; due every 10 years ?Recommended yearly ophthalmology/optometry visit for glaucoma screening and checkup ?Recommended yearly dental visit for hygiene and checkup ? ?Vaccinations: ?Influenza vaccine: 11/08/2020; due every Fall Season ?Pneumococcal vaccine: 03/13/2014, 08/21/2019 ?Tdap vaccine: 06/20/2013; due every 10 years ?Shingles vaccine: never done; can check with local pharmacy for vaccine and cost. ?Zoster vaccine: 10/18/2015   ?Covid-19: 03/04/2019, 03/25/2019, 11/09/2019, 05/05/2020, 11/19/2020 ? ?Advanced directives: No ? ?Conditions/risks identified: Yes ? ?Next appointment: Please schedule your next Medicare Wellness Visit with your Nurse Health Advisor in 1 year by calling [phone]. ? ?Preventive Care 3965 Years and Older, Male ?Preventive care refers to lifestyle choices and visits with your health care provider that can promote health and wellness. ?What does preventive care include? ?A yearly physical exam. This is also called an annual well check. ?Dental exams once or twice a year. ?Routine eye exams. Ask your health care provider how often you should have your eyes checked. ?Personal lifestyle choices, including: ?Daily care of your teeth and gums. ?Regular physical activity. ?Eating a healthy diet. ?Avoiding tobacco and drug use. ?Limiting alcohol use. ?Practicing safe sex. ?Taking low doses of aspirin every day. ?Taking vitamin and mineral supplements as recommended by your health care provider. ?What happens during an annual well check? ?The services and screenings done by your health care provider during your annual well check will depend on your age, overall health, lifestyle risk factors, and family  history of disease. ?Counseling  ?Your health care provider may ask you questions about your: ?Alcohol use. ?Tobacco use. ?Drug use. ?Emotional well-being. ?Home and relationship well-being. ?Sexual activity. ?Eating habits. ?History of falls. ?Memory and ability to understand (cognition). ?Work and work Astronome[employer]vironment. ?Screening  ?You may have the following tests or measurements: ?Height, weight, and BMI. ?Blood pressure. ?Lipid and cholesterol levels. These may be checked every 5 years, or more frequently if you are over 68 years old. ?Skin check. ?Lung cancer screening. You may have this screening every year starting at age 555 if you have a 30-pack-year history of smoking and currently smoke or have quit within the past 15 years. ?Fecal occult blood test (FOBT) of the stool. You may have this test every year starting at age 68. ?Flexible sigmoidoscopy or colonoscopy. You may have a sigmoidoscopy every 5 years or a colonoscopy every 10 years starting at age 68. ?Prostate cancer screening. Recommendations will vary depending on your family history and other risks. ?Hepatitis C blood test. ?Hepatitis B blood test. ?Sexually transmitted disease (STD) testing. ?Diabetes screening. This is done by checking your blood sugar (glucose) after you have not eaten for a while (fasting). You may have this done every 1-3 years. ?Abdominal aortic aneurysm (AAA) screening. You may need this if you are a current or former smoker. ?Osteoporosis. You may be screened starting at age 68 if you are at high risk. ?Talk with your health care provider about your test results, treatment options, and if necessary, the need for more tests. ?Vaccines  ?Your health care provider may recommend certain vaccines, such as: ?Influenza vaccine. This is recommended every year. ?Tetanus, diphtheria, and acellular pertussis (Tdap, Td) vaccine. You may need a Td booster every 10 years. ?Zoster vaccine. You may need this after  age 63. ?Pneumococcal  13-valent conjugate (PCV13) vaccine. One dose is recommended after age 60. ?Pneumococcal polysaccharide (PPSV23) vaccine. One dose is recommended after age 33. ?Talk to your health care provider about which screenings and vaccines you need and how often you need them. ?This information is not intended to replace advice given to you by your health care provider. Make sure you discuss any questions you have with your health care provider. ?Document Released: 01/22/2015 Document Revised: 09/15/2015 Document Reviewed: 10/27/2014 ?Elsevier Interactive Patient Education ? 2017 [employer] ? ?Fall Prevention in the Home ?Falls can cause injuries. They can happen to people of all ages. There are many things you can do to make your home safe and to help prevent falls. ?What can I do on the outside of my home? ?Regularly fix the edges of walkways and driveways and fix any cracks. ?Remove anything that might make you trip as you walk through a door, such as a raised step or threshold. ?Trim any bushes or trees on the path to your home. ?Use bright outdoor lighting. ?Clear any walking paths of anything that might make someone trip, such as rocks or tools. ?Regularly check to see if handrails are loose or broken. Make sure that both sides of any steps have handrails. ?Any raised decks and porches should have guardrails on the edges. ?Have any leaves, snow, or ice cleared regularly. ?Use sand or salt on walking paths during winter. ?Clean up any spills in your garage right away. This includes oil or grease spills. ?What can I do in the bathroom? ?Use night lights. ?Install grab bars by the toilet and in the tub and shower. Do not use towel bars as grab bars. ?Use non-skid mats or decals in the tub or shower. ?If you need to sit down in the shower, use a plastic, non-slip stool. ?Keep the floor dry. Clean up any water that spills on the floor as soon as it happens. ?Remove soap buildup in the tub or shower regularly. ?Attach  bath mats securely with double-sided non-slip rug tape. ?Do not have throw rugs and other things on the floor that can make you trip. ?What can I do in the bedroom? ?Use night lights. ?Make sure that you have a light by your bed that is easy to reach. ?Do not use any sheets or blankets that are too big for your bed. They should not hang down onto the floor. ?Have a firm chair that has side arms. You can use this for support while you get dressed. ?Do not have throw rugs and other things on the floor that can make you trip. ?What can I do in the kitchen? ?Clean up any spills right away. ?Avoid walking on wet floors. ?Keep items that you use a lot in easy-to-reach places. ?If you need to reach something above you, use a strong step stool that has a grab bar. ?Keep electrical cords out of the way. ?Do not use floor polish or wax that makes floors slippery. If you must use wax, use non-skid floor wax. ?Do not have throw rugs and other things on the floor that can make you trip. ?What can I do with my stairs? ?Do not leave any items on the stairs. ?Make sure that there are handrails on both sides of the stairs and use them. Fix handrails that are broken or loose. Make sure that handrails are as long as the stairways. ?Check any carpeting to make sure that it is firmly attached to the stairs.  Fix any carpet that is loose or worn. ?Avoid having throw rugs at the top or bottom of the stairs. If you do have throw rugs, attach them to the floor with carpet tape. ?Make sure that you have a light switch at the top of the stairs and the bottom of the stairs. If you do not have them, ask someone to add them for you. ?What else can I do to help prevent falls? ?Wear shoes that: ?Do not have high heels. ?Have rubber bottoms. ?Are comfortable and fit you well. ?Are closed at the toe. Do not wear sandals. ?If you use a stepladder: ?Make sure that it is fully opened. Do not climb a closed stepladder. ?Make sure that both sides of the  stepladder are locked into place. ?Ask someone to hold it for you, if possible. ?Clearly mark and make sure that you can see: ?Any grab bars or handrails. ?First and last steps. ?Where the edge of each step is.

## 2021-04-25 NOTE — Progress Notes (Signed)
?I connected with Terry Harrington today by telephone and verified that I am speaking with the correct person using two identifiers. ?Location patient: home ?Location provider: work ?Persons participating in the virtual visit: patient, provider. ?  ?I discussed the limitations, risks, security and privacy concerns of performing an evaluation and management service by telephone and the availability of in person appointments. I also discussed with the patient that there may be a patient responsible charge related to this service. The patient expressed understanding and verbally consented to this telephonic visit.  ?  ?Interactive audio and video telecommunications were attempted between this provider and patient, however failed, due to patient having technical difficulties OR patient did not have access to video capability.  We continued and completed visit with audio only. ? ?Some vital signs may be absent or patient reported.  ? ?Time Spent with patient on telephone encounter: 30 minutes ? ?Subjective:  ? SYLVAIN BROCCOLI is a 66 y.o. male who presents for Medicare Annual/Subsequent preventive examination. ? ?Review of Systems    ? ?Cardiac Risk Factors include: advanced age (>54men, >88 women);diabetes mellitus;dyslipidemia;family history of premature cardiovascular disease;hypertension;male gender ? ?   ?Objective:  ?  ?There were no vitals filed for this visit. ?There is no height or weight on file to calculate BMI. ? ? ?  04/25/2021  ?  9:32 AM 03/01/2016  ?  3:44 PM 02/04/2016  ? 11:54 AM 01/11/2016  ?  9:46 AM 11/30/2015  ?  9:19 AM 02/13/2015  ?  7:00 AM 02/12/2015  ? 10:01 PM  ?Advanced Directives  ?Does Patient Have a Medical Advance Directive? No Yes No Yes Yes No No  ?Type of [employer] treasure[employer] of [place];Living will  [place];Living will [place];Living will    ?Copy of [place] in Chart?  Yes   Yes    ?Would patient like information  on creating a medical advance directive? No - Patient declined  Yes (ED - Information included in AVS)      ? ? ?Current Medications (verified) ?Outpatient Encounter Medications as of 04/25/2021  ?Medication Sig  ? amlodipine-olmesartan (AZOR) 10-20 MG tablet TAKE 1 TABLET BY MOUTH EVERY DAY  ? Cholecalciferol (VITAMIN D3) 1.25 MG (50000 UT) CAPS TAKE 1 CAPSULE BY MOUTH ONE TIME PER WEEK  ? empagliflozin (JARDIANCE) 25 MG TABS tablet Take 1 tablet (25 mg total) by mouth daily.  ? fenofibrate (TRICOR) 48 MG tablet Take 1 tablet (48 mg total) by mouth daily.  ? levothyroxine (SYNTHROID) 100 MCG tablet Take 1 tablet (100 mcg total) by mouth daily.  ? loratadine (CLARITIN) 10 MG tablet Take 10 mg by mouth daily.  ? meclizine (ANTIVERT) 25 MG tablet Take 1 tablet (25 mg total) by mouth 3 (three) times daily as needed for dizziness.  ? metFORMIN (GLUCOPHAGE-XR) 500 MG 24 hr tablet Take 2 tablets (1,000 mg total) by mouth daily with breakfast.  ? Multiple Vitamins-Minerals (PRESERVISION AREDS 2) CAPS Take by mouth.  ? pioglitazone (ACTOS) 45 MG tablet Take 1 tablet (45 mg total) by mouth daily.  ? pravastatin (PRAVACHOL) 40 MG tablet TAKE 1 TABLET BY MOUTH EVERY DAY  ? Semaglutide, 1 MG/DOSE, (OZEMPIC, 1 MG/DOSE,) 4 MG/3ML SOPN Inject 2 mg into the skin every 7 (seven) days.  ? Testosterone 12.5 MG/ACT (1%) GEL Place 1 Pump onto the skin daily.  ? vitamin B-12 (CYANOCOBALAMIN) 1000 MCG tablet Take 2,000 mcg by mouth daily.  ? ?No facility-administered  encounter medications on file as of 04/25/2021.  ? ? ?Allergies (verified) ?Crab [shellfish allergy], Iodine, Lisinopril, Crestor [rosuvastatin], Farxiga [dapagliflozin], Iohexol, Peanut-containing drug products, Aspirin, Codeine, and Penicillins  ? ?History: ?Past Medical History:  ?Diagnosis Date  ? B12 deficiency   ? Back pain   ? Diabetes mellitus   ? Drug use   ? Food allergy   ? GERD (gastroesophageal reflux disease)   ? Heart murmur   ? Hyperlipidemia   ? Hypertension    ? on meds  ? Hypothyroidism   ? on meds  ? Joint pain   ? Lactose intolerance   ? Sleep apnea   ? SOB (shortness of breath)   ? Vitamin D deficiency   ? ?Past Surgical History:  ?Procedure Laterality Date  ? DENTAL SURGERY    ? several dental procedure approx 10 between 1999-2012  ? HERNIA REPAIR    ? as a child  ? SHOULDER ARTHROSCOPY  05/31/2011  ? Procedure: ARTHROSCOPY SHOULDER;  Surgeon: Sharmon Revere, MD;  Location: [facility];  Service: Orthopedics;  Laterality: Left;  Shoulder Acromioplasty Left  ? SHOULDER SURGERY  05/31/2011   ? impingment left  ? ?Family History  ?Problem Relation Age of Onset  ? Stroke Mother   ? Diabetes Mother   ? Hyperlipidemia Mother   ? Hypertension Mother   ? Heart disease Mother   ? Thyroid disease Mother   ? Heart disease Father   ? Hyperlipidemia Father   ? Hypertension Father   ? Kidney disease Father   ? Diabetes Father   ? Cancer Maternal Grandfather   ? Anesthesia problems Neg Hx   ? Hypotension Neg Hx   ? Malignant hyperthermia Neg Hx   ? Pseudochol deficiency Neg Hx   ? ?Social History  ? ?Socioeconomic History  ? Marital status: Divorced  ?  Spouse name: Not on file  ? Number of children: Not on file  ? Years of education: Not on file  ? Highest education level: Not on file  ?Occupational History  ? Occupation: Retired  ?Tobacco Use  ? Smoking status: Former  ?  Packs/day: 1.50  ?  Years: 26.00  ?  Pack years: 39.00  ?  Types: Cigarettes  ?  Quit date: 03/02/2008  ?  Years since quitting: 13.1  ? Smokeless tobacco: Former  ?  Quit date: 05/25/2008  ?Vaping Use  ? Vaping Use: Never used  ?Substance and Sexual Activity  ? Alcohol use: Yes  ?  Alcohol/week: 0.0 standard drinks  ?  Comment: rarely  ? Drug use: No  ? Sexual activity: Not Currently  ?Other Topics Concern  ? Not on file  ?Social History Narrative  ? Not on file  ? ?Social Determinants of Health  ? ?Financial Resource Strain: Low Risk   ? Difficulty of Paying Living Expenses: Not hard at all  ?Food Insecurity: No Food  Insecurity  ? Worried About Charity fundraiser in the Last Year: Never true  ? Ran Out of Food in the Last Year: Never true  ?Transportation Needs: No Transportation Needs  ? Lack of Transportation (Medical): No  ? Lack of Transportation (Non-Medical): No  ?Physical Activity: Sufficiently Active  ? Days of Exercise per Week: 3 days  ? Minutes of Exercise per Session: 70 min  ?Stress: No Stress Concern Present  ? Feeling of Stress : Not at all  ?Social Connections: Unknown  ? Frequency of Communication with Friends and Family: More than three  times a week  ? Frequency of Social Gatherings with Friends and Family: More than three times a week  ? Attends Religious Services: Patient refused  ? Active Member of Clubs or Organizations: Patient refused  ? Attends Archivist Meetings: Patient refused  ? Marital Status: Divorced  ? ? ?Tobacco Counseling ?Counseling given: Not Answered ? ? ?Clinical Intake: ? ?Pre-visit preparation completed: Yes ? ?Pain : No/denies pain ? ?  ? ?Nutritional Risks: None ?Diabetes: Yes ?CBG done?: No ?Did pt. bring in CBG monitor from home?: No ? ?How often do you need to have someone help you when you read instructions, pamphlets, or other written materials from your doctor or pharmacy?: 1 - Never ?What is the last grade level you completed in school?: [employer] year of college ? ?Diabetic? yes ? ?Interpreter Needed?: No ? ?Information entered by :: Lisette Abu, LPN ? ? ?Activities of Daily Living ? ?  04/25/2021  ?  9:46 AM  ?In your present state of health, do you have any difficulty performing the following activities:  ?Hearing? 0  ?Vision? 0  ?Difficulty concentrating or making decisions? 0  ?Walking or climbing stairs? 0  ?Dressing or bathing? 0  ?Doing errands, shopping? 0  ?Preparing Food and eating ? N  ?Using the Toilet? N  ?In the past six months, have you accidently leaked urine? N  ?Do you have problems with loss of bowel control? N  ?Managing your Medications? N   ?Managing your Finances? N  ?Housekeeping or managing your Housekeeping? N  ? ? ?Patient Care Team: ?Janith Lima, MD as PCP - General (Internal Medicine) ? ?Indicate any recent Medical Services you may have

## 2021-05-09 ENCOUNTER — Ambulatory Visit (INDEPENDENT_AMBULATORY_CARE_PROVIDER_SITE_OTHER): Payer: Medicare HMO | Admitting: Family Medicine

## 2021-05-09 ENCOUNTER — Encounter (INDEPENDENT_AMBULATORY_CARE_PROVIDER_SITE_OTHER): Payer: Self-pay | Admitting: Family Medicine

## 2021-05-09 VITALS — BP 107/71 | HR 61 | Temp 97.8°F | Ht 69.0 in | Wt 203.0 lb

## 2021-05-09 DIAGNOSIS — E119 Type 2 diabetes mellitus without complications: Secondary | ICD-10-CM | POA: Insufficient documentation

## 2021-05-09 DIAGNOSIS — Z6830 Body mass index (BMI) 30.0-30.9, adult: Secondary | ICD-10-CM | POA: Diagnosis not present

## 2021-05-09 DIAGNOSIS — E1169 Type 2 diabetes mellitus with other specified complication: Secondary | ICD-10-CM

## 2021-05-09 DIAGNOSIS — Z7985 Long-term (current) use of injectable non-insulin antidiabetic drugs: Secondary | ICD-10-CM

## 2021-05-09 DIAGNOSIS — E669 Obesity, unspecified: Secondary | ICD-10-CM | POA: Diagnosis not present

## 2021-05-24 ENCOUNTER — Other Ambulatory Visit: Payer: Self-pay | Admitting: Internal Medicine

## 2021-05-24 ENCOUNTER — Encounter: Payer: Self-pay | Admitting: Endocrinology

## 2021-05-24 ENCOUNTER — Other Ambulatory Visit: Payer: Self-pay | Admitting: Endocrinology

## 2021-05-24 MED ORDER — TESTOSTERONE 12.5 MG/ACT (1%) TD GEL: 1.0000 | Freq: Every day | TRANSDERMAL | 2 refills | Status: DC

## 2021-05-24 NOTE — Progress Notes (Signed)
Chief Complaint:   OBESITY Terry Harrington is here to discuss his progress with his obesity treatment plan along with follow-up of his obesity related diagnoses. Terry Harrington is on the Category 2 Plan and states he is following his eating plan approximately 90% of the time. Terry Harrington states he is doing the resistance machine and cycling for 60 minutes 3 times per week.  Today's visit was #: 15 Starting weight: 207 lbs Starting date: 08/11/2021 Today's weight: 203 lbs Today's date: 05/09/2021 Total lbs lost to date: 4 Total lbs lost since last in-office visit: 0  Interim History: Terry Harrington has done well maintaining his weight. He continues to work on strengthening exercise, but not always able to eat all of the food on his plan.   Subjective:   1. Type 2 diabetes mellitus with other specified complication, without long-term current use of insulin (HCC) Terry Harrington notes fasting blood sugars mostly low in the 90's, and 2 hour post prandial mostly range between 120-150.  Assessment/Plan:   1. Type 2 diabetes mellitus with other specified complication, without long-term current use of insulin ([place]) Terry Harrington will continue Ozempic at 2 mg q week (1.5 mg q week for now). No refill needed.   2. Obesity, Current BMI 30.0 Terry Harrington is currently in the action stage of change. As such, his goal is to continue with weight loss efforts. He has agreed to the Category 2 Plan and keeping a food journal and adhering to recommended goals of 1300-1600 calories and 100+ grams of protein daily.   Exercise goals: As is.   Behavioral modification strategies: increasing lean protein intake and increasing vegetables.  Terry Harrington has agreed to follow-up with our clinic in 3 weeks. He was informed of the importance of frequent follow-up visits to maximize his success with intensive lifestyle modifications for his multiple health conditions.   Objective:   Blood pressure 107/71, pulse 61, temperature 97.8 F (36.6 C), height 5\' 9"   (1.753 m), weight 203 lb (92.1 kg), SpO2 97 %. Body mass index is 29.98 kg/m.  General: Cooperative, alert, well developed, in no acute distress. HEENT: Conjunctivae and lids unremarkable. Cardiovascular: Regular rhythm.  Lungs: Normal work of breathing. Neurologic: No focal deficits.   Lab Results  Component Value Date   CREATININE 1.11 12/08/2020   BUN 13 12/08/2020   NA 141 12/08/2020   K 4.7 12/08/2020   CL 106 12/08/2020   CO2 20 12/08/2020   Lab Results  Component Value Date   ALT 11 12/08/2020   AST 12 12/08/2020   ALKPHOS 77 12/08/2020   BILITOT 0.2 12/08/2020   Lab Results  Component Value Date   HGBA1C 6.0 (A) 03/02/2021   HGBA1C 6.0 (H) 12/08/2020   HGBA1C 5.8 (A) 10/26/2020   HGBA1C 6.2 (A) 07/23/2020   HGBA1C 6.2 (A) 03/19/2020   Lab Results  Component Value Date   INSULIN 6.2 12/08/2020   INSULIN 10.1 08/11/2020   Lab Results  Component Value Date   TSH 0.884 12/08/2020   Lab Results  Component Value Date   CHOL 181 12/08/2020   HDL 63 12/08/2020   LDLCALC 98 12/08/2020   LDLDIRECT 86.0 02/10/2019   TRIG 112 12/08/2020   CHOLHDL 3 03/02/2020   Lab Results  Component Value Date   VD25OH 72.93 03/02/2021   VD25OH 49.9 12/08/2020   VD25OH 60.5 08/11/2020   Lab Results  Component Value Date   WBC 4.5 08/11/2020   HGB 14.6 08/11/2020   HCT 44.3 08/11/2020   MCV 92  08/11/2020   PLT 285.0 02/10/2019   Lab Results  Component Value Date   IRON 60 02/02/2017   FERRITIN 170.0 08/17/2014    Obesity Behavioral Intervention:   Approximately 15 minutes were spent on the discussion below.  ASK: We discussed the diagnosis of obesity with Terry Harrington today and Terry Harrington agreed to give [place] permission to discuss obesity behavioral modification therapy today.  ASSESS: Terry Harrington has the diagnosis of obesity and his BMI today is 30.0. Terry Harrington is in the action stage of change.   ADVISE: Terry Harrington was educated on the multiple health risks of obesity as  well as the benefit of weight loss to improve his health. He was advised of the need for long term treatment and the importance of lifestyle modifications to improve his current health and to decrease his risk of future health problems.  AGREE: Multiple dietary modification options and treatment options were discussed and Terry Harrington agreed to follow the recommendations documented in the above note.  ARRANGE: Terry Harrington was educated on the importance of frequent visits to treat obesity as outlined per CMS and USPSTF guidelines and agreed to schedule his next follow up appointment today.  Attestation Statements:   Reviewed by clinician on day of visit: allergies, medications, problem list, medical history, surgical history, family history, social history, and previous encounter notes.   I, Trixie Dredge, am acting as transcriptionist for Dennard Nip, MD.  I have reviewed the above documentation for accuracy and completeness, and I agree with the above. -  Dennard Nip, MD

## 2021-05-26 ENCOUNTER — Ambulatory Visit: Payer: Medicare HMO | Admitting: Endocrinology

## 2021-06-01 ENCOUNTER — Encounter (INDEPENDENT_AMBULATORY_CARE_PROVIDER_SITE_OTHER): Payer: Self-pay | Admitting: Family Medicine

## 2021-06-01 ENCOUNTER — Ambulatory Visit (INDEPENDENT_AMBULATORY_CARE_PROVIDER_SITE_OTHER): Payer: Medicare HMO | Admitting: Family Medicine

## 2021-06-01 ENCOUNTER — Other Ambulatory Visit: Payer: Self-pay | Admitting: Internal Medicine

## 2021-06-01 VITALS — BP 100/61 | HR 63 | Temp 97.5°F | Ht 69.0 in | Wt 201.0 lb

## 2021-06-01 DIAGNOSIS — G4700 Insomnia, unspecified: Secondary | ICD-10-CM | POA: Diagnosis not present

## 2021-06-01 DIAGNOSIS — Z7985 Long-term (current) use of injectable non-insulin antidiabetic drugs: Secondary | ICD-10-CM

## 2021-06-01 DIAGNOSIS — E669 Obesity, unspecified: Secondary | ICD-10-CM | POA: Diagnosis not present

## 2021-06-01 DIAGNOSIS — E1169 Type 2 diabetes mellitus with other specified complication: Secondary | ICD-10-CM | POA: Diagnosis not present

## 2021-06-01 DIAGNOSIS — Z6829 Body mass index (BMI) 29.0-29.9, adult: Secondary | ICD-10-CM | POA: Diagnosis not present

## 2021-06-01 DIAGNOSIS — E039 Hypothyroidism, unspecified: Secondary | ICD-10-CM

## 2021-06-01 MED ORDER — LEVOTHYROXINE SODIUM 100 MCG PO TABS
100.0000 ug | ORAL_TABLET | Freq: Every day | ORAL | 0 refills | Status: DC
Start: 2021-06-01 — End: 2021-09-06

## 2021-06-13 NOTE — Progress Notes (Unsigned)
Chief Complaint:   OBESITY Terry Harrington is here to discuss his progress with his obesity treatment plan along with follow-up of his obesity related diagnoses. Terry Harrington is on {MWMwtlossportion/plan2:23431} and states he is following his eating plan approximately ***% of the time. Terry Harrington states he is *** *** minutes *** times per week.  Today's visit was #: *** Starting weight: *** Starting date: *** Today's weight: *** Today's date: 06/01/2021 Total lbs lost to date: *** Total lbs lost since last in-office visit: ***  Interim History: ***  Subjective:   1. Insomnia, unspecified type ***  2. Type 2 diabetes mellitus with other specified complication, without long-term current use of insulin (HCC) ***   Assessment/Plan:   1. Insomnia, unspecified type ***  2. Type 2 diabetes mellitus with other specified complication, without long-term current use of insulin (HCC) ***  3. Obesity, Current BMI 29.7 Terry Harrington is currently in the action stage of change. As such, his goal is to continue with weight loss efforts. He has agreed to the Category 2 Plan or keeping a food journal and adhering to recommended goals of 1300-1600 calories and 100+ grams of protein daily.   Exercise goals: As is.   Behavioral modification strategies: no skipping meals.  Terry Harrington has agreed to follow-up with our clinic in 2 to 3 weeks. He was informed of the importance of frequent follow-up visits to maximize his success with intensive lifestyle modifications for his multiple health conditions.   Objective:   Blood pressure 100/61, pulse 63, temperature (!) 97.5 F (36.4 C), height 5\' 9"  (1.753 m), weight 201 lb (91.2 kg), SpO2 98 %. Body mass index is 29.68 kg/m.  General: Cooperative, alert, well developed, in no acute distress. HEENT: Conjunctivae and lids unremarkable. Cardiovascular: Regular rhythm.  Lungs: Normal work of breathing. Neurologic: No focal deficits.   Lab Results  Component Value  Date   CREATININE 1.11 12/08/2020   BUN 13 12/08/2020   NA 141 12/08/2020   K 4.7 12/08/2020   CL 106 12/08/2020   CO2 20 12/08/2020   Lab Results  Component Value Date   ALT 11 12/08/2020   AST 12 12/08/2020   ALKPHOS 77 12/08/2020   BILITOT 0.2 12/08/2020   Lab Results  Component Value Date   HGBA1C 6.0 (A) 03/02/2021   HGBA1C 6.0 (H) 12/08/2020   HGBA1C 5.8 (A) 10/26/2020   HGBA1C 6.2 (A) 07/23/2020   HGBA1C 6.2 (A) 03/19/2020   Lab Results  Component Value Date   INSULIN 6.2 12/08/2020   INSULIN 10.1 08/11/2020   Lab Results  Component Value Date   TSH 0.884 12/08/2020   Lab Results  Component Value Date   CHOL 181 12/08/2020   HDL 63 12/08/2020   LDLCALC 98 12/08/2020   LDLDIRECT 86.0 02/10/2019   TRIG 112 12/08/2020   CHOLHDL 3 03/02/2020   Lab Results  Component Value Date   VD25OH 72.93 03/02/2021   VD25OH 49.9 12/08/2020   VD25OH 60.5 08/11/2020   Lab Results  Component Value Date   WBC 4.5 08/11/2020   HGB 14.6 08/11/2020   HCT 44.3 08/11/2020   MCV 92 08/11/2020   PLT 285.0 02/10/2019   Lab Results  Component Value Date   IRON 60 02/02/2017   FERRITIN 170.0 08/17/2014    Obesity Behavioral Intervention:   Approximately 15 minutes were spent on the discussion below.  ASK: We discussed the diagnosis of obesity with Terry Harrington today and Terry Harrington agreed to give [place] permission to discuss obesity  behavioral modification therapy today.  ASSESS: Terry Harrington has the diagnosis of obesity and his BMI today is 29.7. Terry Harrington is in the action stage of change.   ADVISE: Terry Harrington was educated on the multiple health risks of obesity as well as the benefit of weight loss to improve his health. He was advised of the need for long term treatment and the importance of lifestyle modifications to improve his current health and to decrease his risk of future health problems.  AGREE: Multiple dietary modification options and treatment options were discussed and  Terry Harrington agreed to follow the recommendations documented in the above note.  ARRANGE: Terry Harrington was educated on the importance of frequent visits to treat obesity as outlined per CMS and USPSTF guidelines and agreed to schedule his next follow up appointment today.  Attestation Statements:   Reviewed by clinician on day of visit: allergies, medications, problem list, medical history, surgical history, family history, social history, and previous encounter notes.   I, Trixie Dredge, am acting as transcriptionist for Dennard Nip, MD.  I have reviewed the above documentation for accuracy and completeness, and I agree with the above. -  ***

## 2021-06-21 ENCOUNTER — Ambulatory Visit (INDEPENDENT_AMBULATORY_CARE_PROVIDER_SITE_OTHER): Payer: Medicare HMO | Admitting: Family Medicine

## 2021-06-21 ENCOUNTER — Encounter (INDEPENDENT_AMBULATORY_CARE_PROVIDER_SITE_OTHER): Payer: Self-pay | Admitting: Family Medicine

## 2021-06-21 VITALS — BP 94/56 | HR 61 | Temp 97.7°F | Ht 69.0 in | Wt 200.0 lb

## 2021-06-21 DIAGNOSIS — E669 Obesity, unspecified: Secondary | ICD-10-CM | POA: Diagnosis not present

## 2021-06-21 DIAGNOSIS — E1169 Type 2 diabetes mellitus with other specified complication: Secondary | ICD-10-CM

## 2021-06-21 DIAGNOSIS — Z6829 Body mass index (BMI) 29.0-29.9, adult: Secondary | ICD-10-CM | POA: Diagnosis not present

## 2021-06-21 DIAGNOSIS — E785 Hyperlipidemia, unspecified: Secondary | ICD-10-CM | POA: Diagnosis not present

## 2021-06-21 DIAGNOSIS — Z7984 Long term (current) use of oral hypoglycemic drugs: Secondary | ICD-10-CM | POA: Diagnosis not present

## 2021-06-21 DIAGNOSIS — Z7985 Long-term (current) use of injectable non-insulin antidiabetic drugs: Secondary | ICD-10-CM

## 2021-06-21 MED ORDER — OZEMPIC (1 MG/DOSE) 4 MG/3ML ~~LOC~~ SOPN: 2.0000 mg | PEN_INJECTOR | SUBCUTANEOUS | 0 refills | Status: DC

## 2021-06-22 NOTE — Progress Notes (Signed)
Chief Complaint:   OBESITY Terry Harrington is here to discuss his progress with his obesity treatment plan along with follow-up of his obesity related diagnoses. Terry Harrington is on the Category 2 Plan or keeping a food journal and adhering to recommended goals of 1300-1600 calories and 100+ grams of protein daily and states he is following his eating plan approximately 90% of the time. Terry Harrington states he is doing 0 minutes 0 times per week.  Today's visit was #: 47 Starting weight: 207 lbs Starting date: 08/11/2021 Today's weight: 200 lbs Today's date: 06/21/2021 Total lbs lost to date: 7 Total lbs lost since last in-office visit: 1  Interim History: Terry Harrington is working on his diet and weight loss. He has been traveling more, and he is having  to eat out but he is trying to make better choices.   Subjective:   1. Type 2 diabetes mellitus with other specified complication, without long-term current use of insulin ([facility]) Terry Harrington is doing well on Ozempic.  He notes decreased polyphagia but is still able to eat all of the food on his plan.  No side effects were noted.  2. Hyperlipidemia, unspecified hyperlipidemia type Terry Harrington is on Tricor and statin.  He is following a low carbohydrate diet.  No chest pain or myalgias were noted.  Assessment/Plan:   1. Type 2 diabetes mellitus with other specified complication, without long-term current use of insulin (HCC) We will refill Ozempic for 1 month.  We will recheck labs in 1 month. Terry Harrington will continue metformin, Jardiance, and Actos.  - Semaglutide, 1 MG/DOSE, (OZEMPIC, 1 MG/DOSE,) 4 MG/3ML SOPN; Inject 2 mg into the skin every 7 (seven) days.  Dispense: 6 mL; Refill: 0  2. Hyperlipidemia, unspecified hyperlipidemia type Terry Harrington will continue his medications and diet as is and we will plan to recheck labs in 1 month.  3. Obesity, Current BMI 29.6 Terry Harrington is currently in the action stage of change. As such, his goal is to continue with weight loss  efforts. He has agreed to the Category 2 Plan or keeping a food journal and adhering to recommended goals of 1300-1600 calories and 100+ grams of protein daily.   We will recheck fasting labs at his next office visit.  Behavioral modification strategies: travel eating strategies.  Terry Harrington has agreed to follow-up with our clinic in 3 to 4 weeks. He was informed of the importance of frequent follow-up visits to maximize his success with intensive lifestyle modifications for his multiple health conditions.   Objective:   Blood pressure (!) 94/56, pulse 61, temperature 97.7 F (36.5 C), height 5\' 9"  (1.753 m), weight 200 lb (90.7 kg), SpO2 99 %. Body mass index is 29.53 kg/m.  General: Cooperative, alert, well developed, in no acute distress. HEENT: Conjunctivae and lids unremarkable. Cardiovascular: Regular rhythm.  Lungs: Normal work of breathing. Neurologic: No focal deficits.   Lab Results  Component Value Date   CREATININE 1.11 12/08/2020   BUN 13 12/08/2020   NA 141 12/08/2020   K 4.7 12/08/2020   CL 106 12/08/2020   CO2 20 12/08/2020   Lab Results  Component Value Date   ALT 11 12/08/2020   AST 12 12/08/2020   ALKPHOS 77 12/08/2020   BILITOT 0.2 12/08/2020   Lab Results  Component Value Date   HGBA1C 6.0 (A) 03/02/2021   HGBA1C 6.0 (H) 12/08/2020   HGBA1C 5.8 (A) 10/26/2020   HGBA1C 6.2 (A) 07/23/2020   HGBA1C 6.2 (A) 03/19/2020   Lab Results  Component Value Date   INSULIN 6.2 12/08/2020   INSULIN 10.1 08/11/2020   Lab Results  Component Value Date   TSH 0.884 12/08/2020   Lab Results  Component Value Date   CHOL 181 12/08/2020   HDL 63 12/08/2020   LDLCALC 98 12/08/2020   LDLDIRECT 86.0 02/10/2019   TRIG 112 12/08/2020   CHOLHDL 3 03/02/2020   Lab Results  Component Value Date   VD25OH 72.93 03/02/2021   VD25OH 49.9 12/08/2020   VD25OH 60.5 08/11/2020   Lab Results  Component Value Date   WBC 4.5 08/11/2020   HGB 14.6 08/11/2020   HCT 44.3  08/11/2020   MCV 92 08/11/2020   PLT 285.0 02/10/2019   Lab Results  Component Value Date   IRON 60 02/02/2017   FERRITIN 170.0 08/17/2014   Attestation Statements:   Reviewed by clinician on day of visit: allergies, medications, problem list, medical history, surgical history, family history, social history, and previous encounter notes.   I, Trixie Dredge, am acting as transcriptionist for Dennard Nip, MD.  I have reviewed the above documentation for accuracy and completeness, and I agree with the above. -  Dennard Nip, MD

## 2021-06-29 ENCOUNTER — Encounter: Payer: Self-pay | Admitting: Endocrinology

## 2021-06-29 ENCOUNTER — Ambulatory Visit: Payer: Medicare HMO | Admitting: Endocrinology

## 2021-06-29 VITALS — BP 110/66 | HR 60 | Ht 69.0 in | Wt 205.6 lb

## 2021-06-29 DIAGNOSIS — E118 Type 2 diabetes mellitus with unspecified complications: Secondary | ICD-10-CM

## 2021-06-29 DIAGNOSIS — I952 Hypotension due to drugs: Secondary | ICD-10-CM | POA: Diagnosis not present

## 2021-06-29 DIAGNOSIS — E039 Hypothyroidism, unspecified: Secondary | ICD-10-CM | POA: Diagnosis not present

## 2021-06-29 DIAGNOSIS — E291 Testicular hypofunction: Secondary | ICD-10-CM

## 2021-06-29 LAB — POCT GLYCOSYLATED HEMOGLOBIN (HGB A1C): Hemoglobin A1C: 6 % — AB (ref 4.0–5.6)

## 2021-06-29 LAB — BASIC METABOLIC PANEL WITH GFR
BUN: 16 mg/dL (ref 6–23)
CO2: 26 meq/L (ref 19–32)
Calcium: 10.1 mg/dL (ref 8.4–10.5)
Chloride: 107 meq/L (ref 96–112)
Creatinine, Ser: 1.17 mg/dL (ref 0.40–1.50)
GFR: 64.42 mL/min
Glucose, Bld: 103 mg/dL — ABNORMAL HIGH (ref 70–99)
Potassium: 4.4 meq/L (ref 3.5–5.1)
Sodium: 139 meq/L (ref 135–145)

## 2021-06-29 LAB — MICROALBUMIN / CREATININE URINE RATIO
Creatinine,U: 93.6 mg/dL
Microalb Creat Ratio: 0.7 mg/g (ref 0.0–30.0)
Microalb, Ur: <0.7 mg/dL (ref 0.0–1.9)

## 2021-06-29 MED ORDER — TESTOSTERONE 12.5 MG/ACT (1%) TD GEL
1.0000 | Freq: Every day | TRANSDERMAL | 3 refills | Status: DC
Start: 2021-06-29 — End: 2021-09-30

## 2021-06-29 NOTE — Patient Instructions (Addendum)
Stop Metformin and Actos  Take 1/2 BP pill

## 2021-06-29 NOTE — Progress Notes (Unsigned)
Patient ID: Terry Harrington, male   DOB: 07-Sep-1955, 66 y.o.   MRN: [id number]             Reason for Appointment: Type II Diabetes follow-up   History of Present Illness   Diagnosis date: 2010  Previous history: Trul 21 2018 jard Oral hypoglycemic drugs previously used: [name], Januvia, metformin, Actos and Trulicity He was on insulin between 2015 and 2017  A1c range in the last few years is:5.8-8.2  Recent history:     Non-insulin hypoglycemic drugs: Jardiance 25 mg daily, Actos 45 mg daily, metformin ER 1000 mg daily and Ozempic 1 mg weekly     Insulin regimen:            Side effects from medications: None  Current self management, blood sugar patterns and problems identified:  A1c is  Exercise: 3/7  Diet management:      Monitors blood glucose: Once a day.    Glucometer: One Touch.           Blood Glucose readings from meter download:   PRE-MEAL Fasting Lunch Dinner Bedtime Overall  Glucose range: 115-125      Mean/median:        POST-MEAL PC Breakfast PC Lunch PC Dinner  Glucose range:   150  Mean/median:        Hypoglycemia:  none                        Dietician visit: Most recent:      Weight control: max 235  Wt Readings from Last 3 Encounters:  06/29/21 205 lb 9.6 oz (93.3 kg)  06/21/21 200 lb (90.7 kg)  06/01/21 201 lb (91.2 kg)            Diabetes labs:  Lab Results  Component Value Date   HGBA1C 6.0 (A) 06/29/2021   HGBA1C 6.0 (A) 03/02/2021   HGBA1C 6.0 (H) 12/08/2020   Lab Results  Component Value Date   MICROALBUR <0.7 06/29/2021   LDLCALC 98 12/08/2020   CREATININE 1.17 06/29/2021     Allergies as of 06/29/2021       Reactions   Crab [shellfish Allergy] Anaphylaxis   Iodine Anaphylaxis   Lisinopril    cough   Crestor [rosuvastatin]    Muscle aches   Farxiga [dapagliflozin] Other (See Comments)   Joint pain and weakness   Iohexol Swelling   Peanut-containing Drug Products Itching   And whelps    Aspirin Rash   Codeine Rash   Penicillins Rash   Has patient had a PCN reaction causing immediate rash, facial/tongue/throat swelling, SOB or lightheadedness with hypotension: Yes Has patient had a PCN reaction causing severe rash involving mucus membranes or skin necrosis: Yes Has patient had a PCN reaction that required hospitalization No Has patient had a PCN reaction occurring within the last 10 years: No If all of the above answers are "NO", then may proceed with Cephalosporin use.        Medication List        Accurate as of June 29, 2021  9:42 PM. If you have any questions, ask your nurse or doctor.          amlodipine-olmesartan 10-20 MG tablet Commonly known as: AZOR TAKE 1 TABLET BY MOUTH EVERY DAY   empagliflozin 25 MG Tabs tablet Commonly known as: Jardiance Take 1 tablet (25 mg total) by mouth daily.   fenofibrate 48 MG tablet Commonly known as: [employer]  Take 1 tablet (48 mg total) by mouth daily.   levothyroxine 100 MCG tablet Commonly known as: SYNTHROID Take 1 tablet (100 mcg total) by mouth daily.   loratadine 10 MG tablet Commonly known as: CLARITIN Take 10 mg by mouth daily.   meclizine 25 MG tablet Commonly known as: ANTIVERT Take 1 tablet (25 mg total) by mouth 3 (three) times daily as needed for dizziness.   metFORMIN 500 MG 24 hr tablet Commonly known as: GLUCOPHAGE-XR Take 2 tablets (1,000 mg total) by mouth daily with breakfast.   Ozempic (1 MG/DOSE) 4 MG/3ML Sopn Generic drug: Semaglutide (1 MG/DOSE) Inject 2 mg into the skin every 7 (seven) days.   pioglitazone 45 MG tablet Commonly known as: ACTOS Take 1 tablet (45 mg total) by mouth daily.   pravastatin 40 MG tablet Commonly known as: PRAVACHOL TAKE 1 TABLET BY MOUTH EVERY DAY   PreserVision AREDS 2 Caps Take by mouth.   Testosterone 12.5 MG/ACT (1%) Gel Place 1 Pump onto the skin daily. What changed: Another medication with the same name was removed. Continue taking this  medication, and follow the directions you see here. Changed by: Reather Littler, MD   vitamin B-12 1000 MCG tablet Commonly known as: CYANOCOBALAMIN Take 2,000 mcg by mouth daily.   Vitamin D3 1.25 MG (50000 UT) Caps TAKE 1 CAPSULE BY MOUTH ONE TIME PER WEEK        Allergies:  Allergies  Allergen Reactions   Crab [Shellfish Allergy] Anaphylaxis   Iodine Anaphylaxis   Lisinopril     cough   Crestor [Rosuvastatin]     Muscle aches   Farxiga [Dapagliflozin] Other (See Comments)    Joint pain and weakness   Iohexol Swelling   Peanut-Containing Drug Products Itching    And whelps   Aspirin Rash   Codeine Rash   Penicillins Rash    Has patient had a PCN reaction causing immediate rash, facial/tongue/throat swelling, SOB or lightheadedness with hypotension: Yes Has patient had a PCN reaction causing severe rash involving mucus membranes or skin necrosis: Yes Has patient had a PCN reaction that required hospitalization No Has patient had a PCN reaction occurring within the last 10 years: No If all of the above answers are "NO", then may proceed with Cephalosporin use.     Past Medical History:  Diagnosis Date   B12 deficiency    Back pain    Diabetes mellitus    Drug use    Food allergy    GERD (gastroesophageal reflux disease)    Heart murmur    Hyperlipidemia    Hypertension    on meds   Hypothyroidism    on meds   Joint pain    Lactose intolerance    Sleep apnea    SOB (shortness of breath)    Vitamin D deficiency     Past Surgical History:  Procedure Laterality Date   DENTAL SURGERY     several dental procedure approx 10 between 1999-2012   HERNIA REPAIR     as a child   SHOULDER ARTHROSCOPY  05/31/2011   Procedure: ARTHROSCOPY SHOULDER;  Surgeon: Kennieth Rad, MD;  Location: [facility] OR;  Service: Orthopedics;  Laterality: Left;  Shoulder Acromioplasty Left   SHOULDER SURGERY  05/31/2011    impingment left    Family History  Problem Relation Age of Onset    Stroke Mother    Diabetes Mother    Hyperlipidemia Mother    Hypertension Mother    Heart  disease Mother    Thyroid disease Mother    Heart disease Father    Hyperlipidemia Father    Hypertension Father    Kidney disease Father    Diabetes Father    Cancer Maternal Grandfather    Anesthesia problems Neg Hx    Hypotension Neg Hx    Malignant hyperthermia Neg Hx    Pseudochol deficiency Neg Hx     Social History:  reports that he quit smoking about 13 years ago. His smoking use included cigarettes. He has a 39.00 pack-year smoking history. He quit smokeless tobacco use about 13 years ago. He reports current alcohol use. He reports that he does not use drugs.  Review of Systems:  PRIMARY HYPOGONADISM:  He had baseline symptoms of fatigue, some erectile dysfunction and had low baseline testosterone levels with high LH  He has been mostly treated with AndroGel with improvement in his symptoms but has not taken it for the last month since it was not available at his pharmacy Previously on treatment his free testosterone level was in the therapeutic range He has recently complained of feeling tired and having some erectile dysfunction Asking about injections  Lab Results  Component Value Date   TESTOFREE 6.7 10/26/2020   TESTOFREE 6.2 (L) 09/10/2018   TESTOFREE 2.7 (L) 08/31/2017   TESTOFREE 4.9 (L) 04/13/2017   TESTOFREE 2.1 (L) 12/27/2016   Lab Results  Component Value Date   LH 14.06 (H) 04/13/2017     Last diabetic eye exam date  Last foot exam date:  Symptoms of neuropathy: No numbness or tingling in the feet  Hypertension:   Treatment includes Benicar Does not complain of feeling lightheaded but he thinks his blood pressure is also about 110-120 at home Blood pressure was also low on the last 2 recent visits with other MDs  BP Readings from Last 3 Encounters:  06/29/21 110/66  06/21/21 (!) 94/56  06/01/21 100/61    Lipids: Managed by PCP, currently on  fenofibrate 48 mg, pravastatin 40 mg with the following lipid history:    Lab Results  Component Value Date   CHOL 181 12/08/2020   CHOL 195 08/11/2020   CHOL 189 03/02/2020   Lab Results  Component Value Date   HDL 63 12/08/2020   HDL 67 08/11/2020   HDL 65.20 03/02/2020   Lab Results  Component Value Date   LDLCALC 98 12/08/2020   LDLCALC 110 (H) 08/11/2020   LDLCALC 104 (H) 03/02/2020   Lab Results  Component Value Date   TRIG 112 12/08/2020   TRIG 99 08/11/2020   TRIG 101.0 03/02/2020   Lab Results  Component Value Date   CHOLHDL 3 03/02/2020   CHOLHDL 3 02/10/2019   CHOLHDL 4 02/06/2018   Lab Results  Component Value Date   LDLDIRECT 86.0 02/10/2019   LDLDIRECT 82.0 02/06/2018   LDLDIRECT 102.0 11/06/2017   Lab Results  Component Value Date   TSH 0.884 12/08/2020   TSH 1.940 08/11/2020   TSH 1.23 03/02/2020   FREET4 1.44 08/11/2020     Examination:   BP 110/66   Pulse 60   Ht 5\' 9"  (1.753 m)   Wt 205 lb 9.6 oz (93.3 kg)   SpO2 99%   BMI 30.36 kg/m   Body mass index is 30.36 kg/m.    ASSESSMENT/ PLAN:    Diabetes type 2:   Current regimen: Ozempic 1 mg, Jardiance 25 mg, metformin, Actos 45 mg  A1c is still consistently controlled and  now 6%  Blood glucose control is overall fairly good but his blood sugar monitor from home was not reviewed His lab glucose was only 103 He is likely benefiting from GLP-1 drugs and Jardiance Previously had been on Actos and metformin but doubt if these are providing any improvement in his blood sugars especially with suboptimal doses of metformin   Recommend that he stop Actos and metformin to simplify his medication regimen while continuing with the other drugs and keep monitoring blood sugars especially after meals Needs to continue regular exercise and other activities to maintain his weight  Urine MICROALBUMIN needs to be checked as it is overdue  HYPERTENSION: Blood pressure is low normal and needs to  reduce his antihypertensives to prevent effects of low blood pressure Renal function and potassium to be checked today  PRIMARY HYPOGONADISM:  Baseline testosterone will be checked today to make sure he is still hypogonadal He will take a written prescription for testosterone gel to see where he can find the prescription to be filled consistently  Patient Instructions  Stop Metformin and Actos  Take 1/2 BP pill   Jerilyn Gillaspie 06/29/2021, 9:42 PM

## 2021-07-01 ENCOUNTER — Encounter: Payer: Self-pay | Admitting: Internal Medicine

## 2021-07-01 DIAGNOSIS — I10 Essential (primary) hypertension: Secondary | ICD-10-CM

## 2021-07-03 LAB — TESTOSTERONE, TOTAL, LC/MS/MS: Testosterone, Total, LC-MS-MS: 132 ng/dL — ABNORMAL LOW (ref 250–1100)

## 2021-07-04 ENCOUNTER — Other Ambulatory Visit: Payer: Self-pay | Admitting: Internal Medicine

## 2021-07-06 ENCOUNTER — Other Ambulatory Visit: Payer: Self-pay | Admitting: Internal Medicine

## 2021-07-06 DIAGNOSIS — E559 Vitamin D deficiency, unspecified: Secondary | ICD-10-CM

## 2021-07-06 MED ORDER — VITAMIN D3 1.25 MG (50000 UT) PO CAPS: ORAL_CAPSULE | ORAL | 0 refills | Status: DC

## 2021-07-08 ENCOUNTER — Other Ambulatory Visit: Payer: Self-pay | Admitting: Internal Medicine

## 2021-07-08 DIAGNOSIS — E781 Pure hyperglyceridemia: Secondary | ICD-10-CM

## 2021-07-19 ENCOUNTER — Encounter (INDEPENDENT_AMBULATORY_CARE_PROVIDER_SITE_OTHER): Payer: Self-pay | Admitting: Family Medicine

## 2021-07-19 ENCOUNTER — Ambulatory Visit (INDEPENDENT_AMBULATORY_CARE_PROVIDER_SITE_OTHER): Payer: Medicare HMO | Admitting: Family Medicine

## 2021-07-19 VITALS — BP 123/78 | HR 69 | Ht 69.0 in | Wt 201.0 lb

## 2021-07-19 DIAGNOSIS — E785 Hyperlipidemia, unspecified: Secondary | ICD-10-CM | POA: Diagnosis not present

## 2021-07-19 DIAGNOSIS — E039 Hypothyroidism, unspecified: Secondary | ICD-10-CM | POA: Diagnosis not present

## 2021-07-19 DIAGNOSIS — E669 Obesity, unspecified: Secondary | ICD-10-CM

## 2021-07-19 DIAGNOSIS — Z6830 Body mass index (BMI) 30.0-30.9, adult: Secondary | ICD-10-CM

## 2021-07-19 DIAGNOSIS — E559 Vitamin D deficiency, unspecified: Secondary | ICD-10-CM

## 2021-07-19 DIAGNOSIS — Z6829 Body mass index (BMI) 29.0-29.9, adult: Secondary | ICD-10-CM | POA: Diagnosis not present

## 2021-07-19 NOTE — Progress Notes (Unsigned)
Chief Complaint:   OBESITY Terry Harrington is here to discuss his progress with his obesity treatment plan along with follow-up of his obesity related diagnoses. Terry Harrington is on the Category 2 Plan or keeping a food journal and adhering to recommended goals of 1300-1600 calories and 100+ grams of protein daily and states he is following his eating plan approximately 95% of the time. Terry Harrington states he is using resistance machine, and riding the exercise bike for 90 minutes 3 times per week.  Today's visit was #: 18 Starting weight: 207 lbs Starting date: 08/11/2021 Today's weight: 201 lbs Today's date: 07/19/2021 Total lbs lost to date: 6 Total lbs lost since last in-office visit: 0  Interim History: Terry Harrington continues to do well with his diet and exercise.  He is retaining a bit of fluid today.  His hunger is mostly controlled.  Subjective:   1. Vitamin D deficiency disease Terry Harrington is on vitamin D, and he is due for labs.  No side effects were noted.  2. Hypothyroidism (acquired) Terry Harrington is on Synthroid, and he is working on his weight loss.  He has no signs of over replacement.  3. Hyperlipidemia with target LDL less than 100 Terry Harrington is on Tricor, and he has decreased cholesterol in his diet.  He denies chest pain.  Assessment/Plan:   1. Vitamin D deficiency disease We will check labs today, and we will follow-up at Karron's next visit. He will follow-up for routine testing of Vitamin D, at least 2-3 times per year to avoid over-replacement.  - Vitamin B12 - VITAMIN D 25 Hydroxy (Vit-D Deficiency, Fractures)  2. Hypothyroidism (acquired) We will check labs today, and we will follow-up at Nieko's next visit. Orders and follow up as documented in patient record.  - CMP14+EGFR  3. Hyperlipidemia with target LDL less than 100 We will check labs today. Terry Harrington will continue to work on diet, exercise and weight loss efforts. Orders and follow up as documented in patient record.   -  CMP14+EGFR - Lipid Panel With LDL/HDL Ratio  4. Obesity, Current BMI 29.8 Terry Harrington is currently in the action stage of change. As such, his goal is to continue with weight loss efforts. He has agreed to the Category 2 Plan.   Exercise goals: As is.   Behavioral modification strategies: no skipping meals and meal planning and cooking strategies.  Terry Harrington has agreed to follow-up with our clinic in 3 to 4 weeks. He was informed of the importance of frequent follow-up visits to maximize his success with intensive lifestyle modifications for his multiple health conditions.   Terry Harrington was informed we would discuss his lab results at his next visit unless there is a critical issue that needs to be addressed sooner. Terry Harrington agreed to keep his next visit at the agreed upon time to discuss these results.  Objective:   Blood pressure 123/78, pulse 69, height $RemoveBe'5\' 9"'DMHnqBVNt$  (1.753 m), weight 201 lb (91.2 kg), SpO2 99 %. Body mass index is 29.68 kg/m.  General: Cooperative, alert, well developed, in no acute distress. HEENT: Conjunctivae and lids unremarkable. Cardiovascular: Regular rhythm.  Lungs: Normal work of breathing. Neurologic: No focal deficits.   Lab Results  Component Value Date   CREATININE 1.17 06/29/2021   BUN 16 06/29/2021   NA 139 06/29/2021   K 4.4 06/29/2021   CL 107 06/29/2021   CO2 26 06/29/2021   Lab Results  Component Value Date   ALT 11 12/08/2020   AST 12 12/08/2020   ALKPHOS  77 12/08/2020   BILITOT 0.2 12/08/2020   Lab Results  Component Value Date   HGBA1C 6.0 (A) 06/29/2021   HGBA1C 6.0 (A) 03/02/2021   HGBA1C 6.0 (H) 12/08/2020   HGBA1C 5.8 (A) 10/26/2020   HGBA1C 6.2 (A) 07/23/2020   Lab Results  Component Value Date   INSULIN 6.2 12/08/2020   INSULIN 10.1 08/11/2020   Lab Results  Component Value Date   TSH 0.884 12/08/2020   Lab Results  Component Value Date   CHOL 181 12/08/2020   HDL 63 12/08/2020   LDLCALC 98 12/08/2020   LDLDIRECT 86.0  02/10/2019   TRIG 112 12/08/2020   CHOLHDL 3 03/02/2020   Lab Results  Component Value Date   VD25OH 72.93 03/02/2021   VD25OH 49.9 12/08/2020   VD25OH 60.5 08/11/2020   Lab Results  Component Value Date   WBC 4.5 08/11/2020   HGB 14.6 08/11/2020   HCT 44.3 08/11/2020   MCV 92 08/11/2020   PLT 285.0 02/10/2019   Lab Results  Component Value Date   IRON 60 02/02/2017   FERRITIN 170.0 08/17/2014    Obesity Behavioral Intervention:   Approximately 15 minutes were spent on the discussion below.  ASK: We discussed the diagnosis of obesity with Terry Harrington today and Terry Harrington agreed to give [place] permission to discuss obesity behavioral modification therapy today.  ASSESS: Field has the diagnosis of obesity and his BMI today is 29.8. Terry Harrington is in the action stage of change.   ADVISE: Terry Harrington was educated on the multiple health risks of obesity as well as the benefit of weight loss to improve his health. He was advised of the need for long term treatment and the importance of lifestyle modifications to improve his current health and to decrease his risk of future health problems.  AGREE: Multiple dietary modification options and treatment options were discussed and Terry Harrington agreed to follow the recommendations documented in the above note.  ARRANGE: Terry Harrington was educated on the importance of frequent visits to treat obesity as outlined per CMS and USPSTF guidelines and agreed to schedule his next follow up appointment today.  Attestation Statements:   Reviewed by clinician on day of visit: allergies, medications, problem list, medical history, surgical history, family history, social history, and previous encounter notes.   I, Trixie Dredge, am acting as transcriptionist for Dennard Nip, MD.  I have reviewed the above documentation for accuracy and completeness, and I agree with the above. -  Dennard Nip, MD

## 2021-07-20 ENCOUNTER — Other Ambulatory Visit (INDEPENDENT_AMBULATORY_CARE_PROVIDER_SITE_OTHER): Payer: Self-pay | Admitting: Family Medicine

## 2021-07-20 DIAGNOSIS — E1169 Type 2 diabetes mellitus with other specified complication: Secondary | ICD-10-CM

## 2021-07-20 LAB — LIPID PANEL WITH LDL/HDL RATIO
Cholesterol, Total: 192 mg/dL (ref 100–199)
HDL: 62 mg/dL (ref >39)
LDL Chol Calc (NIH): 112 mg/dL — ABNORMAL HIGH (ref 0–99)
LDL/HDL Ratio: 1.8 ratio (ref 0.0–3.6)
Triglycerides: 99 mg/dL (ref 0–149)
VLDL Cholesterol Cal: 18 mg/dL (ref 5–40)

## 2021-07-20 LAB — CMP14+EGFR
ALT: 11 IU/L (ref 0–44)
AST: 12 IU/L (ref 0–40)
Albumin/Globulin Ratio: 1.9 (ref 1.2–2.2)
Albumin: 4.6 g/dL (ref 3.9–4.9)
Alkaline Phosphatase: 78 IU/L (ref 44–121)
BUN/Creatinine Ratio: 14 (ref 10–24)
BUN: 14 mg/dL (ref 8–27)
Bilirubin Total: 0.3 mg/dL (ref 0.0–1.2)
CO2: 22 mmol/L (ref 20–29)
Calcium: 10.1 mg/dL (ref 8.6–10.2)
Chloride: 107 mmol/L — ABNORMAL HIGH (ref 96–106)
Creatinine, Ser: 1.02 mg/dL (ref 0.76–1.27)
Globulin, Total: 2.4 g/dL (ref 1.5–4.5)
Glucose: 83 mg/dL (ref 70–99)
Potassium: 4.7 mmol/L (ref 3.5–5.2)
Sodium: 143 mmol/L (ref 134–144)
Total Protein: 7 g/dL (ref 6.0–8.5)
eGFR: 81 mL/min/{1.73_m2} (ref >59)

## 2021-07-20 LAB — VITAMIN B12: Vitamin B-12: 310 pg/mL (ref 232–1245)

## 2021-07-20 LAB — VITAMIN D 25 HYDROXY (VIT D DEFICIENCY, FRACTURES): Vit D, 25-Hydroxy: 52.9 ng/mL (ref 30.0–100.0)

## 2021-07-27 ENCOUNTER — Other Ambulatory Visit: Payer: Self-pay | Admitting: Internal Medicine

## 2021-07-27 DIAGNOSIS — E559 Vitamin D deficiency, unspecified: Secondary | ICD-10-CM

## 2021-07-27 MED ORDER — VITAMIN D3 1.25 MG (50000 UT) PO CAPS: ORAL_CAPSULE | ORAL | 0 refills | Status: DC

## 2021-07-29 ENCOUNTER — Other Ambulatory Visit: Payer: Self-pay | Admitting: Internal Medicine

## 2021-07-29 DIAGNOSIS — N1831 Chronic kidney disease, stage 3a: Secondary | ICD-10-CM

## 2021-07-29 DIAGNOSIS — I10 Essential (primary) hypertension: Secondary | ICD-10-CM

## 2021-07-29 DIAGNOSIS — E118 Type 2 diabetes mellitus with unspecified complications: Secondary | ICD-10-CM

## 2021-08-02 ENCOUNTER — Encounter: Payer: Self-pay | Admitting: Plastic Surgery

## 2021-08-02 ENCOUNTER — Ambulatory Visit: Payer: Medicare HMO | Admitting: Plastic Surgery

## 2021-08-02 ENCOUNTER — Encounter: Payer: Self-pay | Admitting: Internal Medicine

## 2021-08-02 ENCOUNTER — Ambulatory Visit (INDEPENDENT_AMBULATORY_CARE_PROVIDER_SITE_OTHER): Payer: Medicare HMO | Admitting: Internal Medicine

## 2021-08-02 VITALS — Wt 202.4 lb

## 2021-08-02 VITALS — BP 162/80 | HR 85 | Temp 98.2°F | Resp 16 | Ht 69.0 in | Wt 203.2 lb

## 2021-08-02 DIAGNOSIS — I10 Essential (primary) hypertension: Secondary | ICD-10-CM | POA: Diagnosis not present

## 2021-08-02 DIAGNOSIS — M6208 Separation of muscle (nontraumatic), other site: Secondary | ICD-10-CM

## 2021-08-02 DIAGNOSIS — E039 Hypothyroidism, unspecified: Secondary | ICD-10-CM

## 2021-08-02 DIAGNOSIS — R0609 Other forms of dyspnea: Secondary | ICD-10-CM

## 2021-08-02 LAB — CBC WITH DIFFERENTIAL/PLATELET
Basophils Absolute: 0 10*3/uL (ref 0.0–0.1)
Basophils Relative: 0.7 % (ref 0.0–3.0)
Eosinophils Absolute: 0 10*3/uL (ref 0.0–0.7)
Eosinophils Relative: 0.2 % (ref 0.0–5.0)
HCT: 45.4 % (ref 39.0–52.0)
Hemoglobin: 15.3 g/dL (ref 13.0–17.0)
Lymphocytes Relative: 32.5 % (ref 12.0–46.0)
Lymphs Abs: 2.1 10*3/uL (ref 0.7–4.0)
MCHC: 33.7 g/dL (ref 30.0–36.0)
MCV: 91.4 fl (ref 78.0–100.0)
Monocytes Absolute: 0.4 10*3/uL (ref 0.1–1.0)
Monocytes Relative: 6 % (ref 3.0–12.0)
Neutro Abs: 3.9 10*3/uL (ref 1.4–7.7)
Neutrophils Relative %: 60.6 % (ref 43.0–77.0)
Platelets: 294 10*3/uL (ref 150.0–400.0)
RBC: 4.97 Mil/uL (ref 4.22–5.81)
RDW: 14.4 % (ref 11.5–15.5)
WBC: 6.4 10*3/uL (ref 4.0–10.5)

## 2021-08-02 LAB — BRAIN NATRIURETIC PEPTIDE: Pro B Natriuretic peptide (BNP): 7 pg/mL (ref 0.0–100.0)

## 2021-08-02 LAB — TSH: TSH: 0.55 u[IU]/mL (ref 0.35–5.50)

## 2021-08-02 LAB — TROPONIN I (HIGH SENSITIVITY): High Sens Troponin I: 4 ng/L (ref 2–17)

## 2021-08-02 MED ORDER — AMLODIPINE-OLMESARTAN 10-20 MG PO TABS: 1.0000 | ORAL_TABLET | Freq: Every day | ORAL | 1 refills | Status: DC

## 2021-08-02 NOTE — Progress Notes (Unsigned)
Subjective:  Patient ID: Terry Harrington, male    DOB: 01-13-1955  Age: 66 y.o. MRN: [id number]  CC: Hypertension and Hypothyroidism   HPI Terry Harrington presents for f/up -  He complains of a 6 months hx of DOE when he exercises on a bike.  Outpatient Medications Prior to Visit  Medication Sig Dispense Refill   Cholecalciferol (VITAMIN D3) 1.25 MG (50000 UT) CAPS TAKE 1 CAPSULE BY MOUTH ONE TIME PER WEEK 4 capsule 0   empagliflozin (JARDIANCE) 25 MG TABS tablet Take 1 tablet (25 mg total) by mouth daily. 90 tablet 1   fenofibrate (TRICOR) 48 MG tablet TAKE 1 TABLET BY MOUTH EVERY DAY 100 tablet 1   levothyroxine (SYNTHROID) 100 MCG tablet Take 1 tablet (100 mcg total) by mouth daily. 100 tablet 0   loratadine (CLARITIN) 10 MG tablet Take 10 mg by mouth daily.     meclizine (ANTIVERT) 25 MG tablet Take 1 tablet (25 mg total) by mouth 3 (three) times daily as needed for dizziness. 270 tablet 1   Multiple Vitamins-Minerals (PRESERVISION AREDS 2) CAPS Take by mouth.     pravastatin (PRAVACHOL) 40 MG tablet TAKE 1 TABLET BY MOUTH EVERY DAY 90 tablet 1   Testosterone 12.5 MG/ACT (1%) GEL Place 1 Pump onto the skin daily. 150 g 3   vitamin B-12 (CYANOCOBALAMIN) 1000 MCG tablet Take 2,000 mcg by mouth daily.     OZEMPIC, 2 MG/DOSE, 8 MG/3ML SOPN Inject 2 mg into the skin once a week.     No facility-administered medications prior to visit.    ROS Review of Systems  Objective:  BP (!) 162/80 (BP Location: Right Arm, Patient Position: Sitting, Cuff Size: Large)   Pulse 85   Temp 98.2 F (36.8 C) (Oral)   Resp 16   Ht 5\' 9"  (1.753 m)   Wt 203 lb 4 oz (92.2 kg)   SpO2 95%   BMI 30.01 kg/m   BP Readings from Last 3 Encounters:  08/02/21 (!) 162/80  07/19/21 123/78  06/29/21 110/66    Wt Readings from Last 3 Encounters:  08/02/21 203 lb 4 oz (92.2 kg)  08/02/21 202 lb 6.4 oz (91.8 kg)  07/19/21 201 lb (91.2 kg)    Physical Exam Cardiovascular:     Rate and Rhythm:  Normal rate. Rhythm irregularly irregular.     Comments: EKG- NSR with SA, 67 bpm ?LAE No LVH, Q waves, or ST/T wave changes Musculoskeletal:     Right lower leg: No edema.     Left lower leg: No edema.     Lab Results  Component Value Date   WBC 6.4 08/02/2021   HGB 15.3 08/02/2021   HCT 45.4 08/02/2021   PLT 294.0 08/02/2021   GLUCOSE 83 07/19/2021   CHOL 192 07/19/2021   TRIG 99 07/19/2021   HDL 62 07/19/2021   LDLDIRECT 86.0 02/10/2019   LDLCALC 112 (H) 07/19/2021   ALT 11 07/19/2021   AST 12 07/19/2021   NA 143 07/19/2021   K 4.7 07/19/2021   CL 107 (H) 07/19/2021   CREATININE 1.02 07/19/2021   BUN 14 07/19/2021   CO2 22 07/19/2021   TSH 0.55 08/02/2021   PSA 0.36 03/02/2021   HGBA1C 6.0 (A) 06/29/2021   MICROALBUR <0.7 06/29/2021    DG HIP UNILAT WITH PELVIS 2-3 VIEWS LEFT  Result Date: 05/13/2019 CLINICAL DATA:  Pain EXAM: DG HIP (WITH OR WITHOUT PELVIS) 2-3V LEFT COMPARISON:  None. FINDINGS: Frontal pelvis as  well as weightbearing frontal and supine lateral left hip images obtained. There is no appreciable fracture or dislocation. There is mild symmetric narrowing of each hip joint. No erosive change. Sacroiliac joints appear unremarkable bilaterally. There is lower lumbar levoscoliosis. IMPRESSION: Symmetric narrowing hip joints. No fracture or dislocation. Lower lumbar levoscoliosis. Electronically Signed   By: Bretta Bang III M.D.   On: 05/13/2019 08:32    Assessment & Plan:   Terry Harrington was seen today for hypertension and hypothyroidism.  Diagnoses and all orders for this visit:  Hypothyroidism (acquired) -     TSH; Future -     TSH  Primary hypertension -     Aldosterone + renin activity w/ ratio; Future -     TSH; Future -     EKG 12-Lead -     CBC with Differential/Platelet; Future -     CBC with Differential/Platelet -     TSH -     Aldosterone + renin activity w/ ratio -     amlodipine-olmesartan (AZOR) 10-20 MG tablet; Take 1 tablet by  mouth daily.  DOE (dyspnea on exertion) -     CT CARDIAC SCORING (SELF PAY ONLY); Future -     CBC with Differential/Platelet; Future -     Troponin I (High Sensitivity); Future -     Brain natriuretic peptide; Future -     Brain natriuretic peptide -     CBC with Differential/Platelet -     Troponin I (High Sensitivity)   I am having Terry Harrington start on amlodipine-olmesartan. I am also having him maintain his vitamin B-12, loratadine, PreserVision AREDS 2, meclizine, empagliflozin, pravastatin, levothyroxine, Testosterone, fenofibrate, Vitamin D3, and Ozempic (2 MG/DOSE).  Meds ordered this encounter  Medications   amlodipine-olmesartan (AZOR) 10-20 MG tablet    Sig: Take 1 tablet by mouth daily.    Dispense:  90 tablet    Refill:  1     Follow-up: Return in about 3 months (around 11/02/2021).  Sanda Linger, MD

## 2021-08-02 NOTE — Patient Instructions (Signed)
Hypertension, Adult High blood pressure (hypertension) is when the force of blood pumping through the arteries is too strong. The arteries are the blood vessels that carry blood from the heart throughout the body. Hypertension forces the heart to work harder to pump blood and may cause arteries to become narrow or stiff. Untreated or uncontrolled hypertension can lead to a heart attack, heart failure, a stroke, kidney disease, and other problems. A blood pressure reading consists of a higher number over a lower number. Ideally, your blood pressure should be below 120/80. The first ("top") number is called the systolic pressure. It is a measure of the pressure in your arteries as your heart beats. The second ("bottom") number is called the diastolic pressure. It is a measure of the pressure in your arteries as the heart relaxes. What are the causes? The exact cause of this condition is not known. There are some conditions that result in high blood pressure. What increases the risk? Certain factors may make you more likely to develop high blood pressure. Some of these risk factors are under your control, including: Smoking. Not getting enough exercise or physical activity. Being overweight. Having too much fat, sugar, calories, or salt (sodium) in your diet. Drinking too much alcohol. Other risk factors include: Having a personal history of heart disease, diabetes, high cholesterol, or kidney disease. Stress. Having a family history of high blood pressure and high cholesterol. Having obstructive sleep apnea. Age. The risk increases with age. What are the signs or symptoms? High blood pressure may not cause symptoms. Very high blood pressure (hypertensive crisis) may cause: Headache. Fast or irregular heartbeats (palpitations). Shortness of breath. Nosebleed. Nausea and vomiting. Vision changes. Severe chest pain, dizziness, and seizures. How is this diagnosed? This condition is diagnosed by  measuring your blood pressure while you are seated, with your arm resting on a flat surface, your legs uncrossed, and your feet flat on the floor. The cuff of the blood pressure monitor will be placed directly against the skin of your upper arm at the level of your heart. Blood pressure should be measured at least twice using the same arm. Certain conditions can cause a difference in blood pressure between your right and left arms. If you have a high blood pressure reading during one visit or you have normal blood pressure with other risk factors, you may be asked to: Return on a different day to have your blood pressure checked again. Monitor your blood pressure at home for 1 week or longer. If you are diagnosed with hypertension, you may have other blood or imaging tests to help your health care provider understand your overall risk for other conditions. How is this treated? This condition is treated by making healthy lifestyle changes, such as eating healthy foods, exercising more, and reducing your alcohol intake. You may be referred for counseling on a healthy diet and physical activity. Your health care provider may prescribe medicine if lifestyle changes are not enough to get your blood pressure under control and if: Your systolic blood pressure is above 130. Your diastolic blood pressure is above 80. Your personal target blood pressure may vary depending on your medical conditions, your age, and other factors. Follow these instructions at home: Eating and drinking  Eat a diet that is high in fiber and potassium, and low in sodium, added sugar, and fat. An example of this eating plan is called the DASH diet. DASH stands for Dietary Approaches to Stop Hypertension. To eat this way: Eat   plenty of fresh fruits and vegetables. Try to fill one half of your plate at each meal with fruits and vegetables. Eat whole grains, such as whole-wheat pasta, brown rice, or whole-grain bread. Fill about one  fourth of your plate with whole grains. Eat or drink low-fat dairy products, such as skim milk or low-fat yogurt. Avoid fatty cuts of meat, processed or cured meats, and poultry with skin. Fill about one fourth of your plate with lean proteins, such as fish, chicken without skin, beans, eggs, or tofu. Avoid pre-made and processed foods. These tend to be higher in sodium, added sugar, and fat. Reduce your daily sodium intake. Many people with hypertension should eat less than 1,500 mg of sodium a day. Do not drink alcohol if: Your health care provider tells you not to drink. You are pregnant, may be pregnant, or are planning to become pregnant. If you drink alcohol: Limit how much you have to: 0-1 drink a day for women. 0-2 drinks a day for men. Know how much alcohol is in your drink. In the [place] one drink equals one 12 oz bottle of beer (355 mL), one 5 oz glass of wine (148 mL), or one 1 oz glass of hard liquor (44 mL). Lifestyle  Work with your health care provider to maintain a healthy body weight or to lose weight. Ask what an ideal weight is for you. Get at least 30 minutes of exercise that causes your heart to beat faster (aerobic exercise) most days of the week. Activities may include walking, swimming, or biking. Include exercise to strengthen your muscles (resistance exercise), such as Pilates or lifting weights, as part of your weekly exercise routine. Try to do these types of exercises for 30 minutes at least 3 days a week. Do not use any products that contain nicotine or tobacco. These products include cigarettes, chewing tobacco, and vaping devices, such as e-cigarettes. If you need help quitting, ask your health care provider. Monitor your blood pressure at home as told by your health care provider. Keep all follow-up visits. This is important. Medicines Take over-the-counter and prescription medicines only as told by your health care provider. Follow directions carefully. Blood  pressure medicines must be taken as prescribed. Do not skip doses of blood pressure medicine. Doing this puts you at risk for problems and can make the medicine less effective. Ask your health care provider about side effects or reactions to medicines that you should watch for. Contact a health care provider if you: Think you are having a reaction to a medicine you are taking. Have headaches that keep coming back (recurring). Feel dizzy. Have swelling in your ankles. Have trouble with your vision. Get help right away if you: Develop a severe headache or confusion. Have unusual weakness or numbness. Feel faint. Have severe pain in your chest or abdomen. Vomit repeatedly. Have trouble breathing. These symptoms may be an emergency. Get help right away. Call 911. Do not wait to see if the symptoms will go away. Do not drive yourself to the hospital. Summary Hypertension is when the force of blood pumping through your arteries is too strong. If this condition is not controlled, it may put you at risk for serious complications. Your personal target blood pressure may vary depending on your medical conditions, your age, and other factors. For most people, a normal blood pressure is less than 120/80. Hypertension is treated with lifestyle changes, medicines, or a combination of both. Lifestyle changes include losing weight, eating a healthy,   low-sodium diet, exercising more, and limiting alcohol. This information is not intended to replace advice given to you by your health care provider. Make sure you discuss any questions you have with your health care provider. Document Revised: 11/02/2020 Document Reviewed: 11/02/2020 Elsevier Patient Education  2023 [employer]  

## 2021-08-02 NOTE — Progress Notes (Signed)
   Subjective:    Patient ID: Terry Harrington, male    DOB: [birth year], 66 y.o.   MRN: [id number]  The patient is a 66 year old very nice male here for evaluation of his abdomen.  He is being seen for bulge in the upper abdominal area.  I first saw him in August 2021.  He did have a hernia as a child that was repaired but has not had any other surgeries.  He was sent by Dr. Sheliah Hatch who said he does not have a current hernia.  He is 5 feet 9 inches tall.  He is still interested in scan repairing his abdominal muscles.  He has done well to lose a few pounds.  I think that he has seen improvement in his blood sugars and blood pressure.  It has not shown up as much on his actual weight.  Today he is 202 pounds.  He is doing better.  My concern about doing the surgery too soon is that he will have too much pressure from the inside and break the stitches that would be reapproximating his rectus muscles.      Review of Systems  Constitutional: Negative.   Eyes: Negative.   Respiratory: Negative.  Negative for chest tightness.   Cardiovascular: Negative.   Endocrine: Negative.   Genitourinary: Negative.   Musculoskeletal: Negative.        Objective:   Physical Exam Vitals and nursing note reviewed.  Constitutional:      Appearance: Normal appearance.  HENT:     Head: Normocephalic.  Cardiovascular:     Rate and Rhythm: Normal rate.     Pulses: Normal pulses.  Pulmonary:     Effort: Pulmonary effort is normal.  Abdominal:     General: There is no distension.     Palpations: There is no mass.     Tenderness: There is no abdominal tenderness. There is no guarding or rebound.  Musculoskeletal:        General: No swelling or deformity.  Skin:    General: Skin is warm.     Capillary Refill: Capillary refill takes less than 2 seconds.     Coloration: Skin is not jaundiced.  Neurological:     Mental Status: He is alert and oriented to person, place, and time.  Psychiatric:         Mood and Affect: Mood normal.        Behavior: Behavior normal.        Thought Content: Thought content normal.        Judgment: Judgment normal.        Assessment & Plan:     ICD-10-CM   1. Rectus diastasis  M62.08       He is motivated and would like to give it a little more time to see if he can drop a little bit more weight and if that would make the difference.  I think that is reasonable and I would like to see him back in 6 months.

## 2021-08-09 ENCOUNTER — Ambulatory Visit (INDEPENDENT_AMBULATORY_CARE_PROVIDER_SITE_OTHER): Payer: Medicare HMO | Admitting: Family Medicine

## 2021-08-09 ENCOUNTER — Other Ambulatory Visit (INDEPENDENT_AMBULATORY_CARE_PROVIDER_SITE_OTHER): Payer: Self-pay | Admitting: Family Medicine

## 2021-08-09 ENCOUNTER — Encounter (INDEPENDENT_AMBULATORY_CARE_PROVIDER_SITE_OTHER): Payer: Self-pay | Admitting: Family Medicine

## 2021-08-09 VITALS — BP 117/58 | HR 66 | Temp 97.5°F | Ht 69.0 in | Wt 204.0 lb

## 2021-08-09 DIAGNOSIS — Z7985 Long-term (current) use of injectable non-insulin antidiabetic drugs: Secondary | ICD-10-CM | POA: Diagnosis not present

## 2021-08-09 DIAGNOSIS — I10 Essential (primary) hypertension: Secondary | ICD-10-CM | POA: Diagnosis not present

## 2021-08-09 DIAGNOSIS — E1169 Type 2 diabetes mellitus with other specified complication: Secondary | ICD-10-CM

## 2021-08-09 DIAGNOSIS — Z6830 Body mass index (BMI) 30.0-30.9, adult: Secondary | ICD-10-CM

## 2021-08-09 DIAGNOSIS — Z7984 Long term (current) use of oral hypoglycemic drugs: Secondary | ICD-10-CM | POA: Diagnosis not present

## 2021-08-09 DIAGNOSIS — E669 Obesity, unspecified: Secondary | ICD-10-CM | POA: Diagnosis not present

## 2021-08-09 LAB — ALDOSTERONE + RENIN ACTIVITY W/ RATIO
ALDO / PRA Ratio: 2.3 Ratio (ref 0.9–28.9)
Aldosterone: 13 ng/dL
Renin Activity: 5.67 ng/mL/h (ref 0.25–5.82)

## 2021-08-09 MED ORDER — OZEMPIC (2 MG/DOSE) 8 MG/3ML ~~LOC~~ SOPN: 2.0000 mg | PEN_INJECTOR | SUBCUTANEOUS | 0 refills | Status: DC

## 2021-08-10 NOTE — Telephone Encounter (Signed)
Please ask pt to check with other pharmacies to see if they have this dose and we will send it to who he would like,.

## 2021-08-14 ENCOUNTER — Other Ambulatory Visit: Payer: Self-pay | Admitting: Internal Medicine

## 2021-08-14 DIAGNOSIS — I10 Essential (primary) hypertension: Secondary | ICD-10-CM

## 2021-08-16 ENCOUNTER — Encounter (INDEPENDENT_AMBULATORY_CARE_PROVIDER_SITE_OTHER): Payer: Self-pay | Admitting: Family Medicine

## 2021-08-17 ENCOUNTER — Encounter (INDEPENDENT_AMBULATORY_CARE_PROVIDER_SITE_OTHER): Payer: Self-pay

## 2021-08-17 ENCOUNTER — Ambulatory Visit: Payer: Medicare HMO | Admitting: Internal Medicine

## 2021-08-21 NOTE — Progress Notes (Unsigned)
Chief Complaint:   OBESITY Terry Harrington is here to discuss his progress with his obesity treatment plan along with follow-up of his obesity related diagnoses. Terry Harrington is on the Category 2 Plan and states he is following his eating plan approximately 90% of the time. Terry Harrington states he is riding the exercise bike and doing resistance for 90 minutes 3 times per week.  Today's visit was #: 19 Starting weight: 207 lbs Starting date: 08/11/2021 Today's weight: 204 lbs Today's date: 08/09/2021 Total lbs lost to date: 3 Total lbs lost since last in-office visit: 0  Interim History: Terry Harrington had a low weight of 197 pounds in January of this year.  He has very slowly been creeping up in his weight.  It has been a year since his RMR was checked.  He notes increased stress with upcoming wedding plans and moving.  Subjective:   1. Essential hypertension Terry Harrington is back on his blood pressure medications and his blood pressure is better controlled.  He denies chest pain, headache, or lightheadedness.  2. Type 2 diabetes mellitus with other specified complication, unspecified whether long term insulin use (HCC) Terry Harrington states his fasting blood sugars range between 120-130, and 2-hour postprandial average in 150's.   Assessment/Plan:   1. Essential hypertension Terry Harrington will continue with his diet, exercise, and medications, and we will follow-up on his blood pressure at his next visit.  2. Type 2 diabetes mellitus with other specified complication, unspecified whether long term insulin use (HCC) Terry Harrington will continue Jardiance and Ozempic, and we will refill Ozempic 2 mg once weekly for 1 month.  - OZEMPIC, 2 MG/DOSE, 8 MG/3ML SOPN; Inject 2 mg into the skin once a week.  Dispense: 3 mL; Refill: 0  3. Obesity, Current BMI 30.2 Terry Harrington is currently in the action stage of change. As such, his goal is to continue with weight loss efforts. He has agreed to the Category 2 Plan.   We will plan to recheck  Terry Harrington fasting RMR at his next visit.  Exercise goals: As is.   Behavioral modification strategies: increasing lean protein intake, meal planning and cooking strategies, and emotional eating strategies.  Terry Harrington has agreed to follow-up with our clinic in 4 weeks. He was informed of the importance of frequent follow-up visits to maximize his success with intensive lifestyle modifications for his multiple health conditions.   Objective:   Blood pressure (!) 117/58, pulse 66, temperature (!) 97.5 F (36.4 C), height 5\' 9"  (1.753 m), weight 204 lb (92.5 kg), SpO2 99 %. Body mass index is 30.13 kg/m.  General: Cooperative, alert, well developed, in no acute distress. HEENT: Conjunctivae and lids unremarkable. Cardiovascular: Regular rhythm.  Lungs: Normal work of breathing. Neurologic: No focal deficits.   Lab Results  Component Value Date   CREATININE 1.02 07/19/2021   BUN 14 07/19/2021   NA 143 07/19/2021   K 4.7 07/19/2021   CL 107 (H) 07/19/2021   CO2 22 07/19/2021   Lab Results  Component Value Date   ALT 11 07/19/2021   AST 12 07/19/2021   ALKPHOS 78 07/19/2021   BILITOT 0.3 07/19/2021   Lab Results  Component Value Date   HGBA1C 6.0 (A) 06/29/2021   HGBA1C 6.0 (A) 03/02/2021   HGBA1C 6.0 (H) 12/08/2020   HGBA1C 5.8 (A) 10/26/2020   HGBA1C 6.2 (A) 07/23/2020   Lab Results  Component Value Date   INSULIN 6.2 12/08/2020   INSULIN 10.1 08/11/2020   Lab Results  Component Value Date  TSH 0.55 08/02/2021   Lab Results  Component Value Date   CHOL 192 07/19/2021   HDL 62 07/19/2021   LDLCALC 112 (H) 07/19/2021   LDLDIRECT 86.0 02/10/2019   TRIG 99 07/19/2021   CHOLHDL 3 03/02/2020   Lab Results  Component Value Date   VD25OH 52.9 07/19/2021   VD25OH 72.93 03/02/2021   VD25OH 49.9 12/08/2020   Lab Results  Component Value Date   WBC 6.4 08/02/2021   HGB 15.3 08/02/2021   HCT 45.4 08/02/2021   MCV 91.4 08/02/2021   PLT 294.0 08/02/2021   Lab  Results  Component Value Date   IRON 60 02/02/2017   FERRITIN 170.0 08/17/2014    Obesity Behavioral Intervention:   Approximately 15 minutes were spent on the discussion below.  ASK: We discussed the diagnosis of obesity with Terry Needle today and Terry Harrington agreed to give [place] permission to discuss obesity behavioral modification therapy today.  ASSESS: Terry Harrington has the diagnosis of obesity and his BMI today is 30.2. Terry Harrington is in the action stage of change.   ADVISE: Terry Harrington was educated on the multiple health risks of obesity as well as the benefit of weight loss to improve his health. He was advised of the need for long term treatment and the importance of lifestyle modifications to improve his current health and to decrease his risk of future health problems.  AGREE: Multiple dietary modification options and treatment options were discussed and Terry Harrington agreed to follow the recommendations documented in the above note.  ARRANGE: Terry Harrington was educated on the importance of frequent visits to treat obesity as outlined per CMS and USPSTF guidelines and agreed to schedule his next follow up appointment today.  Attestation Statements:   Reviewed by clinician on day of visit: allergies, medications, problem list, medical history, surgical history, family history, social history, and previous encounter notes.   I, Burt Knack, am acting as transcriptionist for Quillian Quince, MD.  I have reviewed the above documentation for accuracy and completeness, and I agree with the above. -  Quillian Quince, MD

## 2021-08-25 ENCOUNTER — Other Ambulatory Visit: Payer: Self-pay | Admitting: Internal Medicine

## 2021-08-25 DIAGNOSIS — E559 Vitamin D deficiency, unspecified: Secondary | ICD-10-CM

## 2021-08-28 ENCOUNTER — Other Ambulatory Visit: Payer: Self-pay | Admitting: Internal Medicine

## 2021-08-28 DIAGNOSIS — E559 Vitamin D deficiency, unspecified: Secondary | ICD-10-CM

## 2021-09-05 ENCOUNTER — Other Ambulatory Visit: Payer: Self-pay

## 2021-09-05 DIAGNOSIS — E118 Type 2 diabetes mellitus with unspecified complications: Secondary | ICD-10-CM

## 2021-09-05 MED ORDER — EMPAGLIFLOZIN 25 MG PO TABS: 25.0000 mg | ORAL_TABLET | Freq: Every day | ORAL | 1 refills | Status: DC

## 2021-09-06 ENCOUNTER — Encounter: Payer: Self-pay | Admitting: Endocrinology

## 2021-09-06 ENCOUNTER — Other Ambulatory Visit: Payer: Self-pay | Admitting: Internal Medicine

## 2021-09-06 DIAGNOSIS — E118 Type 2 diabetes mellitus with unspecified complications: Secondary | ICD-10-CM

## 2021-09-06 DIAGNOSIS — E039 Hypothyroidism, unspecified: Secondary | ICD-10-CM

## 2021-09-06 MED ORDER — EMPAGLIFLOZIN 25 MG PO TABS: 25.0000 mg | ORAL_TABLET | Freq: Every day | ORAL | 1 refills | Status: AC

## 2021-09-08 ENCOUNTER — Ambulatory Visit (INDEPENDENT_AMBULATORY_CARE_PROVIDER_SITE_OTHER): Payer: Medicare HMO | Admitting: Family Medicine

## 2021-09-08 ENCOUNTER — Encounter (INDEPENDENT_AMBULATORY_CARE_PROVIDER_SITE_OTHER): Payer: Self-pay | Admitting: Family Medicine

## 2021-09-08 ENCOUNTER — Encounter: Payer: Self-pay | Admitting: Endocrinology

## 2021-09-08 VITALS — BP 148/55 | HR 64 | Temp 97.8°F | Ht 69.0 in | Wt 203.0 lb

## 2021-09-08 DIAGNOSIS — E669 Obesity, unspecified: Secondary | ICD-10-CM

## 2021-09-08 DIAGNOSIS — R0602 Shortness of breath: Secondary | ICD-10-CM | POA: Insufficient documentation

## 2021-09-08 DIAGNOSIS — E1169 Type 2 diabetes mellitus with other specified complication: Secondary | ICD-10-CM | POA: Diagnosis not present

## 2021-09-08 DIAGNOSIS — Z6830 Body mass index (BMI) 30.0-30.9, adult: Secondary | ICD-10-CM | POA: Diagnosis not present

## 2021-09-08 DIAGNOSIS — Z7985 Long-term (current) use of injectable non-insulin antidiabetic drugs: Secondary | ICD-10-CM

## 2021-09-08 DIAGNOSIS — Z7984 Long term (current) use of oral hypoglycemic drugs: Secondary | ICD-10-CM | POA: Diagnosis not present

## 2021-09-14 ENCOUNTER — Encounter (INDEPENDENT_AMBULATORY_CARE_PROVIDER_SITE_OTHER): Payer: Self-pay | Admitting: Family Medicine

## 2021-09-14 DIAGNOSIS — E1169 Type 2 diabetes mellitus with other specified complication: Secondary | ICD-10-CM

## 2021-09-14 NOTE — Progress Notes (Signed)
Chief Complaint:   OBESITY Terry Harrington is here to discuss his progress with his obesity treatment plan along with follow-up of his obesity related diagnoses. Terry Harrington is on the Category 2 Plan and states he is following his eating plan approximately 95% of the time. Terry Harrington states he is doing the resistance machine and exercise bike for 90 minutes 3 times per week.  Today's visit was #: 20 Starting weight: 207 lbs Starting date: 08/11/2020 Today's weight: 203 lbs Today's date: 09/08/2021 Total lbs lost to date: 4 Total lbs lost since last in-office visit: 1  Interim History: Terry Harrington continues to work on his diet and weight loss. He is trying to eat more protein and vegetables, but he may be decreasing his calories too low.   Subjective:   1. SOB (shortness of breath) on exertion Terry Harrington notes mild improvement in shortness of breath with increased exercise, but it is still present with 1 flight of stairs. No further causes outside of sarcopenia obesity noted.   2. Type 2 diabetes mellitus with other specified complication, unspecified whether long term insulin use (HCC) Terry Harrington had been off metformin and his fasting blood sugars have increased to 180. He restarted metformin 1,000 mg every morning.   Assessment/Plan:   1. SOB (shortness of breath) on exertion Swan will continue with his diet and weight loss, and increase strengthening exercises. His RMR has decreased and is below goal. He will continue to add strengthening.   2. Type 2 diabetes mellitus with other specified complication, unspecified whether long term insulin use (HCC) Terry Harrington will continue Ozempic, but increase dose to 1.5 mg (no refill needed); he will continue metformin and Jardiance.   3. Obesity, Current BMI 30.0 Terry Harrington is currently in the action stage of change. As such, his goal is to continue with weight loss efforts. He has agreed to the Category 2 Plan.   Terry Harrington is to work on eating all of the food on  his plan.  Exercise goals: Increase strengthening exercises by adding more resistance.   Behavioral modification strategies: increasing lean protein intake.  Terry Harrington has agreed to follow-up with our clinic in 3 to 4 weeks. He was informed of the importance of frequent follow-up visits to maximize his success with intensive lifestyle modifications for his multiple health conditions.   Objective:   Blood pressure (!) 148/55, pulse 64, temperature 97.8 F (36.6 C), height 5\' 9"  (1.753 m), weight 203 lb (92.1 kg), SpO2 99 %. Body mass index is 29.98 kg/m.  General: Cooperative, alert, well developed, in no acute distress. HEENT: Conjunctivae and lids unremarkable. Cardiovascular: Regular rhythm.  Lungs: Normal work of breathing. Neurologic: No focal deficits.   Lab Results  Component Value Date   CREATININE 1.02 07/19/2021   BUN 14 07/19/2021   NA 143 07/19/2021   K 4.7 07/19/2021   CL 107 (H) 07/19/2021   CO2 22 07/19/2021   Lab Results  Component Value Date   ALT 11 07/19/2021   AST 12 07/19/2021   ALKPHOS 78 07/19/2021   BILITOT 0.3 07/19/2021   Lab Results  Component Value Date   HGBA1C 6.0 (A) 06/29/2021   HGBA1C 6.0 (A) 03/02/2021   HGBA1C 6.0 (H) 12/08/2020   HGBA1C 5.8 (A) 10/26/2020   HGBA1C 6.2 (A) 07/23/2020   Lab Results  Component Value Date   INSULIN 6.2 12/08/2020   INSULIN 10.1 08/11/2020   Lab Results  Component Value Date   TSH 0.55 08/02/2021   Lab Results  Component Value  Date   CHOL 192 07/19/2021   HDL 62 07/19/2021   LDLCALC 112 (H) 07/19/2021   LDLDIRECT 86.0 02/10/2019   TRIG 99 07/19/2021   CHOLHDL 3 03/02/2020   Lab Results  Component Value Date   VD25OH 52.9 07/19/2021   VD25OH 72.93 03/02/2021   VD25OH 49.9 12/08/2020   Lab Results  Component Value Date   WBC 6.4 08/02/2021   HGB 15.3 08/02/2021   HCT 45.4 08/02/2021   MCV 91.4 08/02/2021   PLT 294.0 08/02/2021   Lab Results  Component Value Date   IRON 60  02/02/2017   FERRITIN 170.0 08/17/2014    Obesity Behavioral Intervention:   Approximately 15 minutes were spent on the discussion below.  ASK: We discussed the diagnosis of obesity with Terry Needle today and Terry Harrington agreed to give [place] permission to discuss obesity behavioral modification therapy today.  ASSESS: Terry Harrington has the diagnosis of obesity and his BMI today is 30.0. Terry Harrington is in the action stage of change.   ADVISE: Terry Harrington was educated on the multiple health risks of obesity as well as the benefit of weight loss to improve his health. He was advised of the need for long term treatment and the importance of lifestyle modifications to improve his current health and to decrease his risk of future health problems.  AGREE: Multiple dietary modification options and treatment options were discussed and Terry Harrington agreed to follow the recommendations documented in the above note.  ARRANGE: Terry Harrington was educated on the importance of frequent visits to treat obesity as outlined per CMS and USPSTF guidelines and agreed to schedule his next follow up appointment today.  Attestation Statements:   Reviewed by clinician on day of visit: allergies, medications, problem list, medical history, surgical history, family history, social history, and previous encounter notes.   I, Burt Knack, am acting as transcriptionist for Quillian Quince, MD.  I have reviewed the above documentation for accuracy and completeness, and I agree with the above. -  Quillian Quince, MD

## 2021-09-15 LAB — HM DIABETES EYE EXAM

## 2021-09-19 ENCOUNTER — Encounter: Payer: Self-pay | Admitting: Endocrinology

## 2021-09-19 DIAGNOSIS — H25813 Combined forms of age-related cataract, bilateral: Secondary | ICD-10-CM | POA: Diagnosis not present

## 2021-09-19 DIAGNOSIS — H35033 Hypertensive retinopathy, bilateral: Secondary | ICD-10-CM | POA: Diagnosis not present

## 2021-09-19 DIAGNOSIS — H53412 Scotoma involving central area, left eye: Secondary | ICD-10-CM | POA: Diagnosis not present

## 2021-09-19 DIAGNOSIS — H353120 Nonexudative age-related macular degeneration, left eye, stage unspecified: Secondary | ICD-10-CM | POA: Diagnosis not present

## 2021-09-19 MED ORDER — OZEMPIC (1 MG/DOSE) 4 MG/3ML ~~LOC~~ SOPN: 2.0000 mg | PEN_INJECTOR | SUBCUTANEOUS | 0 refills | Status: DC

## 2021-09-19 NOTE — Telephone Encounter (Signed)
Please send in 1 mg of ozempic x 1

## 2021-09-22 ENCOUNTER — Other Ambulatory Visit (INDEPENDENT_AMBULATORY_CARE_PROVIDER_SITE_OTHER): Payer: Self-pay | Admitting: Family Medicine

## 2021-09-22 ENCOUNTER — Other Ambulatory Visit: Payer: Self-pay | Admitting: Endocrinology

## 2021-09-22 ENCOUNTER — Encounter: Payer: Self-pay | Admitting: Endocrinology

## 2021-09-22 DIAGNOSIS — E1169 Type 2 diabetes mellitus with other specified complication: Secondary | ICD-10-CM

## 2021-09-22 MED ORDER — PIOGLITAZONE HCL 30 MG PO TABS: 30.0000 mg | ORAL_TABLET | Freq: Every day | ORAL | 0 refills | Status: DC

## 2021-09-22 NOTE — Telephone Encounter (Signed)
Please have pt check other pharmacies and send the rx to whoever has ozempic

## 2021-09-23 ENCOUNTER — Encounter: Payer: Self-pay | Admitting: Internal Medicine

## 2021-09-28 ENCOUNTER — Other Ambulatory Visit (HOSPITAL_COMMUNITY): Payer: Self-pay

## 2021-09-28 ENCOUNTER — Encounter (INDEPENDENT_AMBULATORY_CARE_PROVIDER_SITE_OTHER): Payer: Self-pay | Admitting: Nurse Practitioner

## 2021-09-28 ENCOUNTER — Encounter (INDEPENDENT_AMBULATORY_CARE_PROVIDER_SITE_OTHER): Payer: Self-pay | Admitting: *Deleted

## 2021-09-28 ENCOUNTER — Ambulatory Visit (INDEPENDENT_AMBULATORY_CARE_PROVIDER_SITE_OTHER): Payer: Medicare HMO | Admitting: Nurse Practitioner

## 2021-09-28 VITALS — BP 115/68 | HR 74 | Temp 98.2°F | Ht 69.0 in | Wt 202.0 lb

## 2021-09-28 DIAGNOSIS — E1169 Type 2 diabetes mellitus with other specified complication: Secondary | ICD-10-CM | POA: Diagnosis not present

## 2021-09-28 DIAGNOSIS — E669 Obesity, unspecified: Secondary | ICD-10-CM

## 2021-09-28 DIAGNOSIS — Z7984 Long term (current) use of oral hypoglycemic drugs: Secondary | ICD-10-CM | POA: Diagnosis not present

## 2021-09-28 DIAGNOSIS — Z6830 Body mass index (BMI) 30.0-30.9, adult: Secondary | ICD-10-CM

## 2021-09-28 DIAGNOSIS — Z6829 Body mass index (BMI) 29.0-29.9, adult: Secondary | ICD-10-CM

## 2021-09-28 DIAGNOSIS — Z7985 Long-term (current) use of injectable non-insulin antidiabetic drugs: Secondary | ICD-10-CM

## 2021-09-28 MED ORDER — OZEMPIC (2 MG/DOSE) 8 MG/3ML ~~LOC~~ SOPN
2.0000 mg | PEN_INJECTOR | SUBCUTANEOUS | 0 refills | Status: DC
  Filled 2021-09-28: qty 3, 28d supply, fill #0

## 2021-09-29 ENCOUNTER — Other Ambulatory Visit: Payer: Self-pay | Admitting: Endocrinology

## 2021-09-29 NOTE — Progress Notes (Signed)
Chief Complaint:   OBESITY Terry Harrington is here to discuss his progress with his obesity treatment plan along with follow-up of his obesity related diagnoses. Terry Harrington is on the Category 2 Plan and states he is following his eating plan approximately 95% of the time. Terry Harrington states he is gym 90 minutes 3 times per week.  Today's visit was #: 21 Starting weight: 207 lbs Starting date: 08/11/2020 Today's weight: 202 lbs Today's date: 09/28/2021 Total lbs lost to date: 5 lbs Total lbs lost since last in-office visit: 1  Interim History: He is getting married, Sept 30th and moving to [place]. He is currently packing and put his house on the market. Notes some stress eating. He does not eat out. Drinking water, coffee and occasionally tea. Trying to meet his protein goals. Denies hunger and reports occasional cravings.  Subjective:   1. Type 2 diabetes mellitus with other specified complication, without long-term current use of insulin (HCC) Terry Harrington is taking Ozempic 1 mg and Metformin XR 1,000 mg, Jardiance 25 mg and Actos 30 mg. Denies any side effects. Fasting blood sugars 90-150. On statin. Denies hypoglycemia. Last eye exam 9/23. Seeing Endo on a regular basis.  Assessment/Plan:   1. Type 2 diabetes mellitus with other specified complication, without long-term current use of insulin (HCC) We will refill Ozempic 2 mg once a week for 1 month with 0 refills. Patient will continue taking 1 mg with the option of increasing the dose based upon fasting blood sugar and A1c results.  Good blood sugar control is important to decrease the likelihood of diabetic complications such as nephropathy, neuropathy, limb loss, blindness, coronary artery disease, and death. Intensive lifestyle modification including diet, exercise and weight loss are the first line of treatment for diabetes.    -Refill Semaglutide, 2 MG/DOSE, (OZEMPIC, 2 MG/DOSE,) 8 MG/3ML SOPN; Inject 2 mg into the skin once a week.   Dispense: 3 mL; Refill: 0  2. Obesity, Current BMI 29.9 Terry Harrington is currently in the action stage of change. As such, his goal is to continue with weight loss efforts. He has agreed to the Category 2 Plan.   Exercise goals: As is.  Behavioral modification strategies: increasing lean protein intake, no skipping meals, and planning for success.  Terry Harrington has agreed to follow-up with our clinic in 4 weeks. He was informed of the importance of frequent follow-up visits to maximize his success with intensive lifestyle modifications for his multiple health conditions.   Objective:   Blood pressure 115/68, pulse 74, temperature 98.2 F (36.8 C), height 5\' 9"  (1.753 m), weight 202 lb (91.6 kg), SpO2 100 %. Body mass index is 29.83 kg/m.  General: Cooperative, alert, well developed, in no acute distress. HEENT: Conjunctivae and lids unremarkable. Cardiovascular: Regular rhythm.  Lungs: Normal work of breathing. Neurologic: No focal deficits.   Lab Results  Component Value Date   CREATININE 1.02 07/19/2021   BUN 14 07/19/2021   NA 143 07/19/2021   K 4.7 07/19/2021   CL 107 (H) 07/19/2021   CO2 22 07/19/2021   Lab Results  Component Value Date   ALT 11 07/19/2021   AST 12 07/19/2021   ALKPHOS 78 07/19/2021   BILITOT 0.3 07/19/2021   Lab Results  Component Value Date   HGBA1C 6.0 (A) 06/29/2021   HGBA1C 6.0 (A) 03/02/2021   HGBA1C 6.0 (H) 12/08/2020   HGBA1C 5.8 (A) 10/26/2020   HGBA1C 6.2 (A) 07/23/2020   Lab Results  Component Value Date  INSULIN 6.2 12/08/2020   INSULIN 10.1 08/11/2020   Lab Results  Component Value Date   TSH 0.55 08/02/2021   Lab Results  Component Value Date   CHOL 192 07/19/2021   HDL 62 07/19/2021   LDLCALC 112 (H) 07/19/2021   LDLDIRECT 86.0 02/10/2019   TRIG 99 07/19/2021   CHOLHDL 3 03/02/2020   Lab Results  Component Value Date   VD25OH 52.9 07/19/2021   VD25OH 72.93 03/02/2021   VD25OH 49.9 12/08/2020   Lab Results  Component  Value Date   WBC 6.4 08/02/2021   HGB 15.3 08/02/2021   HCT 45.4 08/02/2021   MCV 91.4 08/02/2021   PLT 294.0 08/02/2021   Lab Results  Component Value Date   IRON 60 02/02/2017   FERRITIN 170.0 08/17/2014   Attestation Statements:   Reviewed by clinician on day of visit: allergies, medications, problem list, medical history, surgical history, family history, social history, and previous encounter notes.  I, Brendell Tyus, RMA, am acting as transcriptionist for Everardo Pacific, FNP.  I have reviewed the above documentation for accuracy and completeness, and I agree with the above. Everardo Pacific, FNP

## 2021-10-13 ENCOUNTER — Other Ambulatory Visit: Payer: Self-pay | Admitting: Internal Medicine

## 2021-10-13 DIAGNOSIS — E785 Hyperlipidemia, unspecified: Secondary | ICD-10-CM

## 2021-10-31 ENCOUNTER — Other Ambulatory Visit (INDEPENDENT_AMBULATORY_CARE_PROVIDER_SITE_OTHER): Payer: Self-pay | Admitting: Nurse Practitioner

## 2021-10-31 DIAGNOSIS — E1169 Type 2 diabetes mellitus with other specified complication: Secondary | ICD-10-CM

## 2021-11-01 ENCOUNTER — Other Ambulatory Visit (INDEPENDENT_AMBULATORY_CARE_PROVIDER_SITE_OTHER): Payer: Medicare HMO

## 2021-11-01 DIAGNOSIS — E291 Testicular hypofunction: Secondary | ICD-10-CM

## 2021-11-01 LAB — TESTOSTERONE: Testosterone: 192.57 ng/dL — ABNORMAL LOW (ref 300.00–890.00)

## 2021-11-02 NOTE — Progress Notes (Unsigned)
Patient ID: Terry Harrington, male   DOB: Sep 21, 1955, 66 y.o.   MRN: [id number]             Reason for Appointment: Type II Diabetes follow-up   History of Present Illness   Diagnosis date: 2010  Previous history:  Terry Harrington was started in 2018 Trulicity was started in 2021  Oral hypoglycemic drugs previously used: Terry Harrington, Januvia, metformin, Actos and Trulicity He was on insulin between 2015 and 2017  A1c range in the last few years is:5.8-8.2  Recent history:     Non-insulin hypoglycemic drugs: Jardiance 25 mg daily, Actos 45 mg daily, metformin ER 1000 mg daily and Ozempic 1 mg weekly     Insulin regimen: None            Side effects from medications: None  Current self management, blood sugar patterns and problems identified:  A1c is consistently about the same at 6% He did not bring his monitor for download but appears to be checking some readings in the morning and occasionally after dinner Blood sugars are generally mildly increased fasting although in the lab only 103 No side effects with any of his medications although previously with higher dose of metformin he would have some GI distress and would need to eat more food He thinks he is doing better with Ozempic which was started in 10/22, previously on Trulicity  Exercise: He is generally going to the gym 3/7 doing significant aerobic and resistance exercises Diet management:      Monitors blood glucose: Once a day.    Glucometer: One Touch.           Blood Glucose readings from recall:  PRE-MEAL Fasting Lunch Dinner Bedtime Overall  Glucose range: 115-125      Mean/median:        POST-MEAL PC Breakfast PC Lunch PC Dinner  Glucose range:   150  Mean/median:       Dietician visit: Most recent:      Weight control: Previous maximum 235  Wt Readings from Last 3 Encounters:  09/28/21 202 lb (91.6 kg)  09/08/21 203 lb (92.1 kg)  08/09/21 204 lb (92.5 kg)            Diabetes labs:  Lab  Results  Component Value Date   HGBA1C 6.0 (A) 06/29/2021   HGBA1C 6.0 (A) 03/02/2021   HGBA1C 6.0 (H) 12/08/2020   Lab Results  Component Value Date   MICROALBUR <0.7 06/29/2021   LDLCALC 112 (H) 07/19/2021   CREATININE 1.02 07/19/2021   Other problems addressed today: See review of systems  Allergies as of 11/03/2021       Reactions   Crab [shellfish Allergy] Anaphylaxis   Iodine Anaphylaxis   Lisinopril    cough   Crestor [rosuvastatin]    Muscle aches   Farxiga [dapagliflozin] Other (See Comments)   Joint pain and weakness   Iohexol Swelling   Peanut-containing Drug Products Itching   And whelps   Aspirin Rash   Codeine Rash   Penicillins Rash   Has patient had a PCN reaction causing immediate rash, facial/tongue/throat swelling, SOB or lightheadedness with hypotension: Yes Has patient had a PCN reaction causing severe rash involving mucus membranes or skin necrosis: Yes Has patient had a PCN reaction that required hospitalization No Has patient had a PCN reaction occurring within the last 10 years: No If all of the above answers are "NO", then may proceed with Cephalosporin use.  Medication List        Accurate as of November 02, 2021  8:23 PM. If you have any questions, ask your nurse or doctor.          amlodipine-olmesartan 10-20 MG tablet Commonly known as: AZOR TAKE 1 TABLET BY MOUTH EVERY DAY   cyanocobalamin 1000 MCG tablet Commonly known as: VITAMIN B12 Take 2,000 mcg by mouth daily.   empagliflozin 25 MG Tabs tablet Commonly known as: Jardiance Take 1 tablet (25 mg total) by mouth daily.   fenofibrate 48 MG tablet Commonly known as: TRICOR TAKE 1 TABLET BY MOUTH EVERY DAY   levothyroxine 100 MCG tablet Commonly known as: SYNTHROID TAKE 1 TABLET BY MOUTH EVERY DAY   loratadine 10 MG tablet Commonly known as: CLARITIN Take 10 mg by mouth daily.   meclizine 25 MG tablet Commonly known as: ANTIVERT Take 1 tablet (25 mg total)  by mouth 3 (three) times daily as needed for dizziness.   metFORMIN 500 MG 24 hr tablet Commonly known as: GLUCOPHAGE-XR Take 1,000 mg by mouth every morning.   Ozempic (2 MG/DOSE) 8 MG/3ML Sopn Generic drug: Semaglutide (2 MG/DOSE) Inject 2 mg into the skin once a week.   pioglitazone 30 MG tablet Commonly known as: Actos Take 1 tablet (30 mg total) by mouth daily.   pravastatin 40 MG tablet Commonly known as: PRAVACHOL TAKE 1 TABLET BY MOUTH EVERY DAY   PreserVision AREDS 2 Caps Take by mouth.   Testosterone 12.5 MG/ACT (1%) Gel PLACE 1 PUMP ONTO THE SKIN DAILY   Vitamin D3 1.25 MG (50000 UT) Caps TAKE 1 CAPSULE BY MOUTH ONE TIME PER WEEK        Allergies:  Allergies  Allergen Reactions   Crab [Shellfish Allergy] Anaphylaxis   Iodine Anaphylaxis   Lisinopril     cough   Crestor [Rosuvastatin]     Muscle aches   Farxiga [Dapagliflozin] Other (See Comments)    Joint pain and weakness   Iohexol Swelling   Peanut-Containing Drug Products Itching    And whelps   Aspirin Rash   Codeine Rash   Penicillins Rash    Has patient had a PCN reaction causing immediate rash, facial/tongue/throat swelling, SOB or lightheadedness with hypotension: Yes Has patient had a PCN reaction causing severe rash involving mucus membranes or skin necrosis: Yes Has patient had a PCN reaction that required hospitalization No Has patient had a PCN reaction occurring within the last 10 years: No If all of the above answers are "NO", then may proceed with Cephalosporin use.     Past Medical History:  Diagnosis Date   B12 deficiency    Back pain    Diabetes mellitus    Drug use    Food allergy    GERD (gastroesophageal reflux disease)    Heart murmur    Hyperlipidemia    Hypertension    on meds   Hypothyroidism    on meds   Joint pain    Lactose intolerance    Sleep apnea    SOB (shortness of breath)    Vitamin D deficiency     Past Surgical History:  Procedure Laterality  Date   DENTAL SURGERY     several dental procedure approx 10 between 1999-2012   HERNIA REPAIR     as a child   SHOULDER ARTHROSCOPY  05/31/2011   Procedure: ARTHROSCOPY SHOULDER;  Surgeon: Kennieth Rad, MD;  Location: [facility] OR;  Service: Orthopedics;  Laterality: Left;  Shoulder  Acromioplasty Left   SHOULDER SURGERY  05/31/2011    impingment left    Family History  Problem Relation Age of Onset   Stroke Mother    Diabetes Mother    Hyperlipidemia Mother    Hypertension Mother    Heart disease Mother    Thyroid disease Mother    Heart disease Father    Hyperlipidemia Father    Hypertension Father    Kidney disease Father    Diabetes Father    Cancer Maternal Grandfather    Anesthesia problems Neg Hx    Hypotension Neg Hx    Malignant hyperthermia Neg Hx    Pseudochol deficiency Neg Hx     Social History:  reports that he quit smoking about 13 years ago. His smoking use included cigarettes. He has a 39.00 pack-year smoking history. He quit smokeless tobacco use about 13 years ago. He reports current alcohol use. He reports that he does not use drugs.  Review of Systems:  PRIMARY HYPOGONADISM:  He had baseline symptoms of fatigue, some erectile dysfunction and had low baseline testosterone levels with high LH  He has been mostly treated with AndroGel with improvement in his symptoms but has not taken it for the last month since it was not available at his pharmacy Previously on treatment his free testosterone level was in the therapeutic range He has recently complained of feeling tired and having some erectile dysfunction Asking about injections  Lab Results  Component Value Date   TESTOFREE 6.7 10/26/2020   TESTOFREE 6.2 (L) 09/10/2018   TESTOFREE 2.7 (L) 08/31/2017   TESTOFREE 4.9 (L) 04/13/2017   TESTOFREE 2.1 (L) 12/27/2016   Lab Results  Component Value Date   LH 14.06 (H) 04/13/2017     Last diabetic eye exam date 9/22, no retinopathy  Last foot exam  date: 10/26/2020  Symptoms of neuropathy: No numbness or tingling in the feet  Hypertension:   Treatment includes Benicar Does not complain of feeling lightheaded but he thinks his blood pressure is also about 110-120 at home Blood pressure was also low on the last 2 recent visits with other MDs  BP Readings from Last 3 Encounters:  09/28/21 115/68  09/08/21 (!) 148/55  08/09/21 (!) 117/58    Lipids: Managed by PCP, currently on fenofibrate 48 mg, pravastatin 40 mg with the following lipid history:    Lab Results  Component Value Date   CHOL 192 07/19/2021   CHOL 181 12/08/2020   CHOL 195 08/11/2020   Lab Results  Component Value Date   HDL 62 07/19/2021   HDL 63 12/08/2020   HDL 67 08/11/2020   Lab Results  Component Value Date   LDLCALC 112 (H) 07/19/2021   LDLCALC 98 12/08/2020   LDLCALC 110 (H) 08/11/2020   Lab Results  Component Value Date   TRIG 99 07/19/2021   TRIG 112 12/08/2020   TRIG 99 08/11/2020   Lab Results  Component Value Date   CHOLHDL 3 03/02/2020   CHOLHDL 3 02/10/2019   CHOLHDL 4 02/06/2018   Lab Results  Component Value Date   LDLDIRECT 86.0 02/10/2019   LDLDIRECT 82.0 02/06/2018   LDLDIRECT 102.0 11/06/2017   Longstanding HYPOTHYROIDISM: This is managed by his PCP with Synthroid 100 mcg which he takes regularly  Lab Results  Component Value Date   TSH 0.55 08/02/2021   TSH 0.884 12/08/2020   TSH 1.940 08/11/2020   FREET4 1.44 08/11/2020     Examination:   There were  no vitals taken for this visit.  There is no height or weight on file to calculate BMI.    ASSESSMENT/ PLAN:    Diabetes type 2:   Current regimen: Ozempic 1 mg, Jardiance 25 mg, metformin, Actos 45 mg  A1c is still consistently controlled and again 6%  Blood glucose control is overall fairly good but his blood sugar monitor from home was not reviewed His lab glucose was only 103 He is likely benefiting from GLP-1 drugs and Jardiance Previously had been  on Actos and metformin but doubt if these are providing any improvement in his blood sugars especially with suboptimal doses of metformin  Recommend that he stop Actos and metformin to simplify his medication regimen while continuing with the other drugs and keep monitoring blood sugars especially after meals Needs to continue regular exercise and other activities to maintain his weight at the current or slightly lower levels, currently BMI is still just over 30  Urine MICROALBUMIN needs to be checked as it is overdue  HYPERTENSION: Blood pressure is low normal and needs to reduce his antihypertensives to prevent effects of low blood pressure For now he can reduce his Azor to half a tablet and discuss adjusting the dose with his PCP Renal function and potassium to be checked today  PRIMARY HYPOGONADISM:  Baseline testosterone will be checked today to make sure he is still hypogonadal He will take a written prescription for testosterone gel to see where he can find the prescription to be filled consistently He will then come back in about 2 months for follow-up testosterone level  Primary HYPOTHYROIDISM: Followed by PCP and will need follow-up labs soon  There are no Patient Instructions on file for this visit.   Elayne Snare 11/02/2021, 8:23 PM   Total visit time for evaluation and management for various, review of extensive history and labs and counseling = 40 minutes

## 2021-11-03 ENCOUNTER — Ambulatory Visit (INDEPENDENT_AMBULATORY_CARE_PROVIDER_SITE_OTHER): Payer: Medicare HMO | Admitting: Family Medicine

## 2021-11-03 ENCOUNTER — Encounter: Payer: Self-pay | Admitting: Endocrinology

## 2021-11-03 ENCOUNTER — Encounter (INDEPENDENT_AMBULATORY_CARE_PROVIDER_SITE_OTHER): Payer: Self-pay | Admitting: Family Medicine

## 2021-11-03 ENCOUNTER — Other Ambulatory Visit (HOSPITAL_COMMUNITY): Payer: Self-pay

## 2021-11-03 ENCOUNTER — Ambulatory Visit: Payer: Medicare HMO | Admitting: Endocrinology

## 2021-11-03 VITALS — BP 124/70 | HR 63 | Ht 69.0 in | Wt 201.8 lb

## 2021-11-03 VITALS — BP 117/65 | HR 80 | Temp 97.5°F | Ht 69.0 in | Wt 198.0 lb

## 2021-11-03 DIAGNOSIS — E291 Testicular hypofunction: Secondary | ICD-10-CM

## 2021-11-03 DIAGNOSIS — E118 Type 2 diabetes mellitus with unspecified complications: Secondary | ICD-10-CM

## 2021-11-03 DIAGNOSIS — E1165 Type 2 diabetes mellitus with hyperglycemia: Secondary | ICD-10-CM

## 2021-11-03 DIAGNOSIS — E785 Hyperlipidemia, unspecified: Secondary | ICD-10-CM | POA: Diagnosis not present

## 2021-11-03 DIAGNOSIS — E7849 Other hyperlipidemia: Secondary | ICD-10-CM

## 2021-11-03 DIAGNOSIS — E669 Obesity, unspecified: Secondary | ICD-10-CM

## 2021-11-03 DIAGNOSIS — Z6829 Body mass index (BMI) 29.0-29.9, adult: Secondary | ICD-10-CM

## 2021-11-03 DIAGNOSIS — Z7985 Long-term (current) use of injectable non-insulin antidiabetic drugs: Secondary | ICD-10-CM

## 2021-11-03 DIAGNOSIS — E1169 Type 2 diabetes mellitus with other specified complication: Secondary | ICD-10-CM | POA: Diagnosis not present

## 2021-11-03 LAB — POCT GLYCOSYLATED HEMOGLOBIN (HGB A1C): Hemoglobin A1C: 6.9 % — AB (ref 4.0–5.6)

## 2021-11-03 MED ORDER — ONETOUCH DELICA LANCETS 33G MISC: 1 refills | Status: AC

## 2021-11-03 MED ORDER — OZEMPIC (2 MG/DOSE) 8 MG/3ML ~~LOC~~ SOPN
2.0000 mg | PEN_INJECTOR | SUBCUTANEOUS | 0 refills | Status: DC
  Filled 2021-11-03: qty 3, 28d supply, fill #0

## 2021-11-03 MED ORDER — ONETOUCH VERIO VI STRP: ORAL_STRIP | 12 refills | Status: AC

## 2021-11-03 NOTE — Patient Instructions (Addendum)
3rd metformin at dinner time  Check blood sugars on waking up 2-3 days a week  Also check blood sugars about 2 hours after meals and do this after different meals by rotation  Recommended blood sugar levels on waking up are 90-130 and about 2 hours after meal is 130-160  Please bring your blood sugar monitor to each visit, thank you  Take 2 pumps daily on Androgel

## 2021-11-07 ENCOUNTER — Encounter: Payer: Self-pay | Admitting: Internal Medicine

## 2021-11-07 ENCOUNTER — Ambulatory Visit (INDEPENDENT_AMBULATORY_CARE_PROVIDER_SITE_OTHER): Payer: Medicare HMO | Admitting: Internal Medicine

## 2021-11-07 VITALS — BP 144/68 | HR 76 | Temp 98.1°F | Resp 16 | Ht 69.0 in | Wt 203.0 lb

## 2021-11-07 DIAGNOSIS — I10 Essential (primary) hypertension: Secondary | ICD-10-CM

## 2021-11-07 DIAGNOSIS — E039 Hypothyroidism, unspecified: Secondary | ICD-10-CM

## 2021-11-07 DIAGNOSIS — E118 Type 2 diabetes mellitus with unspecified complications: Secondary | ICD-10-CM | POA: Diagnosis not present

## 2021-11-07 NOTE — Progress Notes (Unsigned)
Subjective:  Patient ID: Terry Harrington, male    DOB: Jun 13, 1955  Age: 66 y.o. MRN: [id number]  CC: Hypertension and Diabetes   HPI LEVERNE AMRHEIN presents for f/up -  He exercises about an 1.5 hours each day.  His endurance is good.  He denies chest pain, shortness of breath, diaphoresis, palpitations, or edema.  Outpatient Medications Prior to Visit  Medication Sig Dispense Refill   amlodipine-olmesartan (AZOR) 10-20 MG tablet TAKE 1 TABLET BY MOUTH EVERY DAY 100 tablet 1   Cholecalciferol (VITAMIN D3) 1.25 MG (50000 UT) CAPS TAKE 1 CAPSULE BY MOUTH ONE TIME PER WEEK 4 capsule 0   empagliflozin (JARDIANCE) 25 MG TABS tablet Take 1 tablet (25 mg total) by mouth daily. 90 tablet 1   fenofibrate (TRICOR) 48 MG tablet TAKE 1 TABLET BY MOUTH EVERY DAY 100 tablet 1   glucose blood (ONETOUCH VERIO) test strip Use to check blood sugar once a day 100 each 12   levothyroxine (SYNTHROID) 100 MCG tablet TAKE 1 TABLET BY MOUTH EVERY DAY 100 tablet 0   loratadine (CLARITIN) 10 MG tablet Take 10 mg by mouth daily.     meclizine (ANTIVERT) 25 MG tablet Take 1 tablet (25 mg total) by mouth 3 (three) times daily as needed for dizziness. 270 tablet 1   metFORMIN (GLUCOPHAGE-XR) 500 MG 24 hr tablet Take 1,000 mg by mouth every morning.     Multiple Vitamins-Minerals (PRESERVISION AREDS 2) CAPS Take by mouth.     OneTouch Delica Lancets 33G MISC Use to check blood sugar once a day 100 each 1   pioglitazone (ACTOS) 30 MG tablet Take 1 tablet (30 mg total) by mouth daily. 90 tablet 0   pravastatin (PRAVACHOL) 40 MG tablet TAKE 1 TABLET BY MOUTH EVERY DAY 90 tablet 1   Semaglutide, 2 MG/DOSE, (OZEMPIC, 2 MG/DOSE,) 8 MG/3ML SOPN Inject 2 mg into the skin once a week. 3 mL 0   Testosterone 12.5 MG/ACT (1%) GEL PLACE 1 PUMP ONTO THE SKIN DAILY 150 g 0   vitamin B-12 (CYANOCOBALAMIN) 1000 MCG tablet Take 2,000 mcg by mouth daily.     No facility-administered medications prior to visit.    ROS Review  of Systems  Constitutional:  Negative for chills, diaphoresis and fatigue.  HENT: Negative.    Eyes: Negative.   Respiratory:  Negative for cough, chest tightness, shortness of breath and wheezing.   Cardiovascular:  Negative for chest pain, palpitations and leg swelling.  Gastrointestinal:  Negative for abdominal pain, constipation, diarrhea, nausea and vomiting.  Endocrine: Negative.   Genitourinary: Negative.  Negative for difficulty urinating.  Musculoskeletal: Negative.  Negative for myalgias.  Skin: Negative.   Neurological:  Negative for dizziness, weakness, light-headedness and numbness.  Hematological:  Negative for adenopathy. Does not bruise/bleed easily.  Psychiatric/Behavioral: Negative.      Objective:  BP (!) 144/68 (BP Location: Right Arm, Patient Position: Sitting, Cuff Size: Large)   Pulse 76   Temp 98.1 F (36.7 C) (Oral)   Resp 16   Ht 5\' 9"  (1.753 m)   Wt 203 lb (92.1 kg)   SpO2 96%   BMI 29.98 kg/m   BP Readings from Last 3 Encounters:  11/07/21 (!) 144/68  11/03/21 117/65  11/03/21 124/70    Wt Readings from Last 3 Encounters:  11/07/21 203 lb (92.1 kg)  11/03/21 198 lb (89.8 kg)  11/03/21 201 lb 12.8 oz (91.5 kg)    Physical Exam Vitals reviewed.  HENT:  Nose: Nose normal.     Mouth/Throat:     Mouth: Mucous membranes are moist.  Eyes:     General: No scleral icterus.    Conjunctiva/sclera: Conjunctivae normal.  Cardiovascular:     Rate and Rhythm: Normal rate and regular rhythm.     Heart sounds: No murmur heard. Pulmonary:     Effort: Pulmonary effort is normal.     Breath sounds: No stridor. No wheezing, rhonchi or rales.  Abdominal:     General: Abdomen is flat.     Palpations: There is no mass.     Tenderness: There is no abdominal tenderness. There is no guarding.     Hernia: No hernia is present.  Musculoskeletal:        General: Normal range of motion.     Cervical back: Neck supple.     Right lower leg: No edema.      Left lower leg: No edema.  Lymphadenopathy:     Cervical: No cervical adenopathy.  Skin:    General: Skin is warm and dry.  Neurological:     General: No focal deficit present.     Mental Status: He is alert.  Psychiatric:        Mood and Affect: Mood normal.        Behavior: Behavior normal.     Lab Results  Component Value Date   WBC 6.4 08/02/2021   HGB 15.3 08/02/2021   HCT 45.4 08/02/2021   PLT 294.0 08/02/2021   GLUCOSE 83 07/19/2021   CHOL 192 07/19/2021   TRIG 99 07/19/2021   HDL 62 07/19/2021   LDLDIRECT 86.0 02/10/2019   LDLCALC 112 (H) 07/19/2021   ALT 11 07/19/2021   AST 12 07/19/2021   NA 143 07/19/2021   K 4.7 07/19/2021   CL 107 (H) 07/19/2021   CREATININE 1.02 07/19/2021   BUN 14 07/19/2021   CO2 22 07/19/2021   TSH 0.55 08/02/2021   PSA 0.36 03/02/2021   HGBA1C 6.9 (A) 11/03/2021   MICROALBUR <0.7 06/29/2021    DG HIP UNILAT WITH PELVIS 2-3 VIEWS LEFT  Result Date: 05/13/2019 CLINICAL DATA:  Pain EXAM: DG HIP (WITH OR WITHOUT PELVIS) 2-3V LEFT COMPARISON:  None. FINDINGS: Frontal pelvis as well as weightbearing frontal and supine lateral left hip images obtained. There is no appreciable fracture or dislocation. There is mild symmetric narrowing of each hip joint. No erosive change. Sacroiliac joints appear unremarkable bilaterally. There is lower lumbar levoscoliosis. IMPRESSION: Symmetric narrowing hip joints. No fracture or dislocation. Lower lumbar levoscoliosis. Electronically Signed   By: Lowella Grip III M.D.   On: 05/13/2019 08:32    Assessment & Plan:   Yamato was seen today for hypertension and diabetes.  Diagnoses and all orders for this visit:  Type II diabetes mellitus with manifestations ([place])- His blood sugar is adequately well controlled. -     HM Diabetes Foot Exam  Primary hypertension- His blood pressure is adequately well controlled.  Hypothyroidism (acquired)- He is euthyroid.   I am having [name]  maintain his cyanocobalamin, loratadine, PreserVision AREDS 2, meclizine, fenofibrate, amlodipine-olmesartan, Vitamin D3, levothyroxine, empagliflozin, metFORMIN, pioglitazone, Testosterone, pravastatin, OneTouch Verio, OneTouch Delica Lancets 76P, and Ozempic (2 MG/DOSE).  No orders of the defined types were placed in this encounter.    Follow-up: Return in about 4 months (around 03/09/2022).  Scarlette Calico, MD

## 2021-11-07 NOTE — Patient Instructions (Signed)
Hypertension, Adult High blood pressure (hypertension) is when the force of blood pumping through the arteries is too strong. The arteries are the blood vessels that carry blood from the heart throughout the body. Hypertension forces the heart to work harder to pump blood and may cause arteries to become narrow or stiff. Untreated or uncontrolled hypertension can lead to a heart attack, heart failure, a stroke, kidney disease, and other problems. A blood pressure reading consists of a higher number over a lower number. Ideally, your blood pressure should be below 120/80. The first ("top") number is called the systolic pressure. It is a measure of the pressure in your arteries as your heart beats. The second ("bottom") number is called the diastolic pressure. It is a measure of the pressure in your arteries as the heart relaxes. What are the causes? The exact cause of this condition is not known. There are some conditions that result in high blood pressure. What increases the risk? Certain factors may make you more likely to develop high blood pressure. Some of these risk factors are under your control, including: Smoking. Not getting enough exercise or physical activity. Being overweight. Having too much fat, sugar, calories, or salt (sodium) in your diet. Drinking too much alcohol. Other risk factors include: Having a personal history of heart disease, diabetes, high cholesterol, or kidney disease. Stress. Having a family history of high blood pressure and high cholesterol. Having obstructive sleep apnea. Age. The risk increases with age. What are the signs or symptoms? High blood pressure may not cause symptoms. Very high blood pressure (hypertensive crisis) may cause: Headache. Fast or irregular heartbeats (palpitations). Shortness of breath. Nosebleed. Nausea and vomiting. Vision changes. Severe chest pain, dizziness, and seizures. How is this diagnosed? This condition is diagnosed by  measuring your blood pressure while you are seated, with your arm resting on a flat surface, your legs uncrossed, and your feet flat on the floor. The cuff of the blood pressure monitor will be placed directly against the skin of your upper arm at the level of your heart. Blood pressure should be measured at least twice using the same arm. Certain conditions can cause a difference in blood pressure between your right and left arms. If you have a high blood pressure reading during one visit or you have normal blood pressure with other risk factors, you may be asked to: Return on a different day to have your blood pressure checked again. Monitor your blood pressure at home for 1 week or longer. If you are diagnosed with hypertension, you may have other blood or imaging tests to help your health care provider understand your overall risk for other conditions. How is this treated? This condition is treated by making healthy lifestyle changes, such as eating healthy foods, exercising more, and reducing your alcohol intake. You may be referred for counseling on a healthy diet and physical activity. Your health care provider may prescribe medicine if lifestyle changes are not enough to get your blood pressure under control and if: Your systolic blood pressure is above 130. Your diastolic blood pressure is above 80. Your personal target blood pressure may vary depending on your medical conditions, your age, and other factors. Follow these instructions at home: Eating and drinking  Eat a diet that is high in fiber and potassium, and low in sodium, added sugar, and fat. An example of this eating plan is called the DASH diet. DASH stands for Dietary Approaches to Stop Hypertension. To eat this way: Eat   plenty of fresh fruits and vegetables. Try to fill one half of your plate at each meal with fruits and vegetables. Eat whole grains, such as whole-wheat pasta, brown rice, or whole-grain bread. Fill about one  fourth of your plate with whole grains. Eat or drink low-fat dairy products, such as skim milk or low-fat yogurt. Avoid fatty cuts of meat, processed or cured meats, and poultry with skin. Fill about one fourth of your plate with lean proteins, such as fish, chicken without skin, beans, eggs, or tofu. Avoid pre-made and processed foods. These tend to be higher in sodium, added sugar, and fat. Reduce your daily sodium intake. Many people with hypertension should eat less than 1,500 mg of sodium a day. Do not drink alcohol if: Your health care provider tells you not to drink. You are pregnant, may be pregnant, or are planning to become pregnant. If you drink alcohol: Limit how much you have to: 0-1 drink a day for women. 0-2 drinks a day for men. Know how much alcohol is in your drink. In the [place] one drink equals one 12 oz bottle of beer (355 mL), one 5 oz glass of wine (148 mL), or one 1 oz glass of hard liquor (44 mL). Lifestyle  Work with your health care provider to maintain a healthy body weight or to lose weight. Ask what an ideal weight is for you. Get at least 30 minutes of exercise that causes your heart to beat faster (aerobic exercise) most days of the week. Activities may include walking, swimming, or biking. Include exercise to strengthen your muscles (resistance exercise), such as Pilates or lifting weights, as part of your weekly exercise routine. Try to do these types of exercises for 30 minutes at least 3 days a week. Do not use any products that contain nicotine or tobacco. These products include cigarettes, chewing tobacco, and vaping devices, such as e-cigarettes. If you need help quitting, ask your health care provider. Monitor your blood pressure at home as told by your health care provider. Keep all follow-up visits. This is important. Medicines Take over-the-counter and prescription medicines only as told by your health care provider. Follow directions carefully. Blood  pressure medicines must be taken as prescribed. Do not skip doses of blood pressure medicine. Doing this puts you at risk for problems and can make the medicine less effective. Ask your health care provider about side effects or reactions to medicines that you should watch for. Contact a health care provider if you: Think you are having a reaction to a medicine you are taking. Have headaches that keep coming back (recurring). Feel dizzy. Have swelling in your ankles. Have trouble with your vision. Get help right away if you: Develop a severe headache or confusion. Have unusual weakness or numbness. Feel faint. Have severe pain in your chest or abdomen. Vomit repeatedly. Have trouble breathing. These symptoms may be an emergency. Get help right away. Call 911. Do not wait to see if the symptoms will go away. Do not drive yourself to the hospital. Summary Hypertension is when the force of blood pumping through your arteries is too strong. If this condition is not controlled, it may put you at risk for serious complications. Your personal target blood pressure may vary depending on your medical conditions, your age, and other factors. For most people, a normal blood pressure is less than 120/80. Hypertension is treated with lifestyle changes, medicines, or a combination of both. Lifestyle changes include losing weight, eating a healthy,   low-sodium diet, exercising more, and limiting alcohol. This information is not intended to replace advice given to you by your health care provider. Make sure you discuss any questions you have with your health care provider. Document Revised: 11/02/2020 Document Reviewed: 11/02/2020 Elsevier Patient Education  2023 [employer]  

## 2021-11-08 NOTE — Progress Notes (Signed)
Chief Complaint:   OBESITY Terry Harrington is here to discuss his progress with his obesity treatment plan along with follow-up of his obesity related diagnoses. [name] is on the Category 2 Plan and states he is following his eating plan approximately 50% of the time. [name] states he is doing 0 minutes 0 times per week.  Today's visit was #: 22 Starting weight: 207 lbs Starting date: 08/11/2020 Today's weight: 198 lbs Today's date: 11/03/2021 Total lbs lost to date: 9 Total lbs lost since last in-office visit: 4  Interim History: [name] has done well with weight loss.  He got married on 10/08/2021, and he was infected with COVID after his marriage but he is feeling much better now.  He tolerated GLP-1 well.  He is relocating to [place].  Subjective:   1. Type 2 diabetes mellitus with other specified complication, without long-term current use of insulin [facility]) [name] saw Dr. Lucianne Muss today and his A1c was 6.9, and he was advised to increase his metformin to 1000 mg every morning and 500 mg every afternoon now.  He is tolerating Ozempic 2 mg once weekly with no side effects noted.  2. Other hyperlipidemia Sanjeev is taking pravastatin 40 mg once daily with no side effects noted.  Assessment/Plan:   1. Type 2 diabetes mellitus with other specified complication, without long-term current use of insulin (HCC) [name] will continue his medications, his eating plan, and exercise.  We will refill Ozempic for 1 month.  - Semaglutide, 2 MG/DOSE, (OZEMPIC, 2 MG/DOSE,) 8 MG/3ML SOPN; Inject 2 mg into the skin once a week.  Dispense: 3 mL; Refill: 0  2. Other hyperlipidemia Trey will continue pravastatin, and he will continue with his healthy eating plan and exercise.  3. Obesity, Current BMI 29.4 [name] is currently in the action stage of change. As such, his goal is to continue with weight loss efforts. He has agreed to the Category 2 Plan or following a lower carbohydrate, vegetable and  lean protein rich diet plan.   We discussed meeting protein needs first.  Handouts was given on good food choices.  Behavioral modification strategies: increasing lean protein intake, decreasing simple carbohydrates, meal planning and cooking strategies, keeping healthy foods in the home, holiday eating strategies , and planning for success.  [name] has agreed to follow-up with our clinic in 4 to 6 weeks. He was informed of the importance of frequent follow-up visits to maximize his success with intensive lifestyle modifications for his multiple health conditions.   Objective:   Blood pressure 117/65, pulse 80, temperature (!) 97.5 F (36.4 C), height 5\' 9"  (1.753 m), weight 198 lb (89.8 kg), SpO2 97 %. Body mass index is 29.24 kg/m.  General: Cooperative, alert, well developed, in no acute distress. HEENT: Conjunctivae and lids unremarkable. Cardiovascular: Regular rhythm.  Lungs: Normal work of breathing. Neurologic: No focal deficits.   Lab Results  Component Value Date   CREATININE 1.02 07/19/2021   BUN 14 07/19/2021   NA 143 07/19/2021   K 4.7 07/19/2021   CL 107 (H) 07/19/2021   CO2 22 07/19/2021   Lab Results  Component Value Date   ALT 11 07/19/2021   AST 12 07/19/2021   ALKPHOS 78 07/19/2021   BILITOT 0.3 07/19/2021   Lab Results  Component Value Date   HGBA1C 6.9 (A) 11/03/2021   HGBA1C 6.0 (A) 06/29/2021   HGBA1C 6.0 (A) 03/02/2021   HGBA1C 6.0 (H) 12/08/2020   HGBA1C 5.8 (A) 10/26/2020   Lab  Results  Component Value Date   INSULIN 6.2 12/08/2020   INSULIN 10.1 08/11/2020   Lab Results  Component Value Date   TSH 0.55 08/02/2021   Lab Results  Component Value Date   CHOL 192 07/19/2021   HDL 62 07/19/2021   LDLCALC 112 (H) 07/19/2021   LDLDIRECT 86.0 02/10/2019   TRIG 99 07/19/2021   CHOLHDL 3 03/02/2020   Lab Results  Component Value Date   VD25OH 52.9 07/19/2021   VD25OH 72.93 03/02/2021   VD25OH 49.9 12/08/2020   Lab Results   Component Value Date   WBC 6.4 08/02/2021   HGB 15.3 08/02/2021   HCT 45.4 08/02/2021   MCV 91.4 08/02/2021   PLT 294.0 08/02/2021   Lab Results  Component Value Date   IRON 60 02/02/2017   FERRITIN 170.0 08/17/2014   Attestation Statements:   Reviewed by clinician on day of visit: allergies, medications, problem list, medical history, surgical history, family history, social history, and previous encounter notes.  Time spent on visit including pre-visit chart review and post-visit care and charting was 43 minutes.   I, Trixie Dredge, am acting as transcriptionist for Dennard Nip, MD.  I have reviewed the above documentation for accuracy and completeness, and I agree with the above. -  Dennard Nip, MD

## 2021-11-11 ENCOUNTER — Other Ambulatory Visit (HOSPITAL_COMMUNITY): Payer: Self-pay

## 2021-11-21 ENCOUNTER — Other Ambulatory Visit: Payer: Self-pay | Admitting: Endocrinology

## 2021-11-22 ENCOUNTER — Other Ambulatory Visit: Payer: Self-pay | Admitting: Internal Medicine

## 2021-11-22 DIAGNOSIS — E559 Vitamin D deficiency, unspecified: Secondary | ICD-10-CM

## 2021-12-13 ENCOUNTER — Other Ambulatory Visit: Payer: Self-pay | Admitting: Internal Medicine

## 2021-12-13 DIAGNOSIS — E039 Hypothyroidism, unspecified: Secondary | ICD-10-CM

## 2021-12-21 ENCOUNTER — Other Ambulatory Visit: Payer: Self-pay | Admitting: Internal Medicine

## 2021-12-21 ENCOUNTER — Other Ambulatory Visit (INDEPENDENT_AMBULATORY_CARE_PROVIDER_SITE_OTHER): Payer: Self-pay | Admitting: Family Medicine

## 2021-12-21 ENCOUNTER — Other Ambulatory Visit: Payer: Self-pay | Admitting: Endocrinology

## 2021-12-21 DIAGNOSIS — E1169 Type 2 diabetes mellitus with other specified complication: Secondary | ICD-10-CM

## 2021-12-21 DIAGNOSIS — E039 Hypothyroidism, unspecified: Secondary | ICD-10-CM

## 2021-12-21 MED ORDER — LEVOTHYROXINE SODIUM 100 MCG PO TABS: 100.0000 ug | ORAL_TABLET | Freq: Every day | ORAL | 0 refills | Status: AC

## 2021-12-21 MED ORDER — PIOGLITAZONE HCL 30 MG PO TABS: 30.0000 mg | ORAL_TABLET | Freq: Every day | ORAL | 0 refills | Status: AC

## 2021-12-21 MED ORDER — METFORMIN HCL ER 500 MG PO TB24: 1000.0000 mg | ORAL_TABLET | Freq: Every morning | ORAL | 0 refills | Status: AC

## 2021-12-27 ENCOUNTER — Encounter: Payer: Self-pay | Admitting: Plastic Surgery

## 2021-12-27 ENCOUNTER — Encounter: Payer: Self-pay | Admitting: Internal Medicine

## 2021-12-27 ENCOUNTER — Other Ambulatory Visit (HOSPITAL_COMMUNITY): Payer: Self-pay

## 2021-12-27 ENCOUNTER — Encounter (INDEPENDENT_AMBULATORY_CARE_PROVIDER_SITE_OTHER): Payer: Self-pay | Admitting: Family Medicine

## 2021-12-27 ENCOUNTER — Ambulatory Visit: Payer: Medicare HMO | Admitting: Plastic Surgery

## 2021-12-27 ENCOUNTER — Ambulatory Visit (INDEPENDENT_AMBULATORY_CARE_PROVIDER_SITE_OTHER): Payer: Medicare HMO | Admitting: Family Medicine

## 2021-12-27 VITALS — BP 113/56 | HR 71 | Temp 97.5°F | Ht 69.0 in | Wt 201.0 lb

## 2021-12-27 VITALS — BP 148/74 | HR 63

## 2021-12-27 DIAGNOSIS — E669 Obesity, unspecified: Secondary | ICD-10-CM

## 2021-12-27 DIAGNOSIS — I10 Essential (primary) hypertension: Secondary | ICD-10-CM | POA: Diagnosis not present

## 2021-12-27 DIAGNOSIS — Z6829 Body mass index (BMI) 29.0-29.9, adult: Secondary | ICD-10-CM | POA: Diagnosis not present

## 2021-12-27 DIAGNOSIS — Z7985 Long-term (current) use of injectable non-insulin antidiabetic drugs: Secondary | ICD-10-CM | POA: Diagnosis not present

## 2021-12-27 DIAGNOSIS — Z6830 Body mass index (BMI) 30.0-30.9, adult: Secondary | ICD-10-CM | POA: Diagnosis not present

## 2021-12-27 DIAGNOSIS — M6208 Separation of muscle (nontraumatic), other site: Secondary | ICD-10-CM

## 2021-12-27 DIAGNOSIS — E1169 Type 2 diabetes mellitus with other specified complication: Secondary | ICD-10-CM | POA: Diagnosis not present

## 2021-12-27 DIAGNOSIS — E6609 Other obesity due to excess calories: Secondary | ICD-10-CM

## 2021-12-27 MED ORDER — OZEMPIC (2 MG/DOSE) 8 MG/3ML ~~LOC~~ SOPN
2.0000 mg | PEN_INJECTOR | SUBCUTANEOUS | 0 refills | Status: AC
  Filled 2021-12-27: qty 3, 28d supply, fill #0

## 2021-12-27 NOTE — Progress Notes (Signed)
   Subjective:    Patient ID: Terry Harrington, male    DOB: 12-13-1955, 66 y.o.   MRN: [id number]  The patient is a 66 year old male here for reevaluation of his abdomen.  He has noticed this bulge of his abdomen for several years at least since 2021.  He did have a hernia repaired as a child but has not had her hernia surgery since.  He was evaluated by Dr. Sheliah Hatch who said that he did not have hernia at this time.  He is 5 feet 9 inches tall and weighs 201 pounds.  We have been trying to get his weight down so that he will be able to have a sustained repair.  My concern is that if I repair the rectus and he has the remaining internal abdominal fat pushing against it that it is not going to be sustainable.  He is currently under a lot of stress.  He got married and is trying to sell his house and moved to [place].  No change in his physical exam.  Abdomen is soft.  [facility]      Review of Systems  Constitutional: Negative.   HENT: Negative.    Eyes: Negative.   Respiratory: Negative.  Negative for chest tightness and shortness of breath.   Cardiovascular: Negative.   Gastrointestinal: Negative.   Endocrine: Negative.   Genitourinary: Negative.   Musculoskeletal: Negative.        Objective:   Physical Exam Constitutional:      Appearance: Normal appearance.  HENT:     Head: Normocephalic.  Cardiovascular:     Rate and Rhythm: Normal rate.     Pulses: Normal pulses.  Pulmonary:     Effort: Pulmonary effort is normal.  Abdominal:     Palpations: Abdomen is soft. There is no mass.     Tenderness: There is no abdominal tenderness. There is no guarding or rebound.     Hernia: No hernia is present.  Musculoskeletal:        General: No swelling.  Skin:    Coloration: Skin is not jaundiced.     Findings: No bruising.  Neurological:     Mental Status: He is alert and oriented to person, place, and time.  Psychiatric:        Mood  and Affect: Mood normal.        Thought Content: Thought content normal.        Judgment: Judgment normal.        Assessment & Plan:     ICD-10-CM   1. Class 1 obesity due to excess calories with serious comorbidity and body mass index (BMI) of 30.0 to 30.9 in adult  E66.09    Z68.30     2. Type 2 diabetes mellitus with other specified complication, unspecified whether long term insulin use (HCC)  E11.69     3. Rectus diastasis  M62.08       Recommend concerted effort towards weight loss and a virtual visit in 3 months for reevaluation.  Patient is in agreement.

## 2021-12-28 ENCOUNTER — Other Ambulatory Visit (HOSPITAL_COMMUNITY): Payer: Self-pay

## 2022-01-17 NOTE — Progress Notes (Signed)
Chief Complaint:   OBESITY Terry Harrington is here to discuss his progress with his obesity treatment plan along with follow-up of his obesity related diagnoses. Terry Harrington is on the Category 2 Plan and following a lower carbohydrate, vegetable and lean protein rich diet plan and states he is following his eating plan approximately 95% of the time. Terry Harrington states he is using the resistance machine for 90 minutes 3 times per week.  Today's visit was #: 23 Starting weight: 207 lbs Starting date: 08/11/2020 Today's weight: 201 lbs Today's date: 12/27/2021 Total lbs lost to date: 6 Total lbs lost since last in-office visit: 0  Interim History: Terry Harrington has done well with trying to minimize holiday weight gain.  He is traveling soon and he is open to discussing both travel strategies and holiday eating strategies.  Subjective:   1. Essential hypertension Terry Harrington's blood pressure is well-controlled on his medications.  He has no signs of hypotension.  2. Type 2 diabetes mellitus with other specified complication, without long-term current use of insulin (HCC) Terry Harrington's fasting blood sugars range between 112-122 per the patient, and 2-hour postprandial 150-170's.  His A1c has been creeping up to 6.9 at his last labs.  He denies nausea or vomiting on Ozempic.  Assessment/Plan:   1. Essential hypertension Terry Harrington will continue with his diet and weight loss efforts.  We discussed the importance of hydration and watching for hypotension while working on weight loss.  2. Type 2 diabetes mellitus with other specified complication, without long-term current use of insulin (HCC) We will refill Ozempic for 1 month.  Terry Harrington will continue to work on decreasing simple carbohydrates.  - Semaglutide, 2 MG/DOSE, (OZEMPIC, 2 MG/DOSE,) 8 MG/3ML SOPN; Inject 2 mg into the skin once a week.  Dispense: 3 mL; Refill: 0  3. Obesity, Current BMI 29.7 Terry Harrington is currently in the action stage of change. As such, his  goal is to continue with weight loss efforts. He has agreed to the Category 2 Plan.   Exercise goals: As is.   Behavioral modification strategies: increasing lean protein intake, travel eating strategies, and holiday eating strategies .  Terry Harrington has agreed to follow-up with our clinic in 4 to 5 weeks. He was informed of the importance of frequent follow-up visits to maximize his success with intensive lifestyle modifications for his multiple health conditions.   Objective:   Blood pressure (!) 113/56, pulse 71, temperature (!) 97.5 F (36.4 C), height 5\' 9"  (1.753 m), weight 201 lb (91.2 kg), SpO2 98 %. Body mass index is 29.68 kg/m.  General: Cooperative, alert, well developed, in no acute distress. HEENT: Conjunctivae and lids unremarkable. Cardiovascular: Regular rhythm.  Lungs: Normal work of breathing. Neurologic: No focal deficits.   Lab Results  Component Value Date   CREATININE 1.02 07/19/2021   BUN 14 07/19/2021   NA 143 07/19/2021   K 4.7 07/19/2021   CL 107 (H) 07/19/2021   CO2 22 07/19/2021   Lab Results  Component Value Date   ALT 11 07/19/2021   AST 12 07/19/2021   ALKPHOS 78 07/19/2021   BILITOT 0.3 07/19/2021   Lab Results  Component Value Date   HGBA1C 6.9 (A) 11/03/2021   HGBA1C 6.0 (A) 06/29/2021   HGBA1C 6.0 (A) 03/02/2021   HGBA1C 6.0 (H) 12/08/2020   HGBA1C 5.8 (A) 10/26/2020   Lab Results  Component Value Date   INSULIN 6.2 12/08/2020   INSULIN 10.1 08/11/2020   Lab Results  Component Value Date  TSH 0.55 08/02/2021   Lab Results  Component Value Date   CHOL 192 07/19/2021   HDL 62 07/19/2021   LDLCALC 112 (H) 07/19/2021   LDLDIRECT 86.0 02/10/2019   TRIG 99 07/19/2021   CHOLHDL 3 03/02/2020   Lab Results  Component Value Date   VD25OH 52.9 07/19/2021   VD25OH 72.93 03/02/2021   VD25OH 49.9 12/08/2020   Lab Results  Component Value Date   WBC 6.4 08/02/2021   HGB 15.3 08/02/2021   HCT 45.4 08/02/2021   MCV 91.4  08/02/2021   PLT 294.0 08/02/2021   Lab Results  Component Value Date   IRON 60 02/02/2017   FERRITIN 170.0 08/17/2014   Attestation Statements:   Reviewed by clinician on day of visit: allergies, medications, problem list, medical history, surgical history, family history, social history, and previous encounter notes.   I, Burt Knack, am acting as transcriptionist for Quillian Quince, MD.  I have reviewed the above documentation for accuracy and completeness, and I agree with the above. -  Quillian Quince, MD

## 2022-02-01 ENCOUNTER — Ambulatory Visit (INDEPENDENT_AMBULATORY_CARE_PROVIDER_SITE_OTHER): Payer: Medicare HMO | Admitting: Family Medicine

## 2022-02-05 ENCOUNTER — Other Ambulatory Visit: Payer: Self-pay | Admitting: Internal Medicine

## 2022-02-05 DIAGNOSIS — E781 Pure hyperglyceridemia: Secondary | ICD-10-CM

## 2022-02-15 ENCOUNTER — Other Ambulatory Visit: Payer: Medicare HMO

## 2022-02-16 ENCOUNTER — Ambulatory Visit: Payer: Medicare HMO | Admitting: Internal Medicine

## 2022-02-17 ENCOUNTER — Ambulatory Visit: Payer: Medicare HMO | Admitting: Endocrinology

## 2022-02-27 ENCOUNTER — Other Ambulatory Visit: Payer: Self-pay | Admitting: Internal Medicine

## 2022-02-27 DIAGNOSIS — I10 Essential (primary) hypertension: Secondary | ICD-10-CM

## 2022-03-23 ENCOUNTER — Other Ambulatory Visit: Payer: Self-pay | Admitting: Internal Medicine

## 2022-03-23 ENCOUNTER — Other Ambulatory Visit: Payer: Self-pay | Admitting: Endocrinology

## 2022-03-28 ENCOUNTER — Telehealth (INDEPENDENT_AMBULATORY_CARE_PROVIDER_SITE_OTHER): Payer: Medicare HMO | Admitting: Plastic Surgery

## 2022-03-28 DIAGNOSIS — M6208 Separation of muscle (nontraumatic), other site: Secondary | ICD-10-CM

## 2022-03-28 NOTE — Progress Notes (Signed)
   Subjective:    Patient ID: Terry Harrington, male    DOB: [birth year], 67 y.o.   MRN: [id number]  The patient is a 67 year old man joining me by phone.  We tried to do a televisit but it was not working.  I have been following him for his abdomen and the rectus diastases.  He had a hernia repair as a child but did not have any hernia repair since then.  He was evaluated by Dr. Kieth Brightly who stated that he did not appreciate a hernia.  He is 5 feet 9 inches tall and now weighs 199 pounds he has been trying to decrease his weight but it has been very slow.  The main concern I have is about the tightness of his abdomen.  If he is not able to lose weight there will be appropriate domain to get him closed.  He is now living in [place] which is where he was when he answered to the call.  He still has a house here in [place] but is trying to moved to [place].  He is also going to try and find a Psychologist, sport and exercise where he is to do a consultation.      Review of Systems  Constitutional: Negative.   HENT: Negative.    Eyes: Negative.   Respiratory: Negative.    Cardiovascular: Negative.   Gastrointestinal: Negative.   Endocrine: Negative.   Genitourinary: Negative.   Musculoskeletal:  Positive for back pain.       Objective:   Physical Exam     Assessment & Plan:     ICD-10-CM   1. Rectus diastasis  M62.08       The patient will try and find care in [place].  We remain available if needed.  I connected with  [name] on 03/28/22 by phone and verified that I am speaking with the correct person using two identifiers.  The patient was at home in [place] and I was at the office.  We spent 5 minutes in discussion.   I discussed the limitations of evaluation and management by telemedicine. The patient expressed understanding and agreed to proceed.

## 2022-04-02 ENCOUNTER — Other Ambulatory Visit: Payer: Self-pay | Admitting: Internal Medicine

## 2022-04-02 DIAGNOSIS — E039 Hypothyroidism, unspecified: Secondary | ICD-10-CM

## 2022-04-25 ENCOUNTER — Telehealth: Payer: Self-pay

## 2022-04-25 NOTE — Telephone Encounter (Signed)
Contacted Terry Harrington to schedule their annual wellness visit. Patient declined to schedule AWV at this time.  [name], CMA (AAMA)  [facility]- AWV Program [phone]

## 2022-07-05 ENCOUNTER — Other Ambulatory Visit: Payer: Self-pay | Admitting: Internal Medicine

## 2022-07-05 DIAGNOSIS — E039 Hypothyroidism, unspecified: Secondary | ICD-10-CM
